# Patient Record
Sex: Female | Born: 1945
Health system: Southern US, Community
[De-identification: ages and names within clinical notes are randomized; demographics above are authoritative.]

## PROBLEM LIST (undated history)

## (undated) DIAGNOSIS — K219 Gastro-esophageal reflux disease without esophagitis: Secondary | ICD-10-CM

## (undated) DIAGNOSIS — I6529 Occlusion and stenosis of unspecified carotid artery: Secondary | ICD-10-CM

## (undated) DIAGNOSIS — R319 Hematuria, unspecified: Secondary | ICD-10-CM

## (undated) DIAGNOSIS — T7840XA Allergy, unspecified, initial encounter: Secondary | ICD-10-CM

## (undated) DIAGNOSIS — M1990 Unspecified osteoarthritis, unspecified site: Secondary | ICD-10-CM

## (undated) DIAGNOSIS — E785 Hyperlipidemia, unspecified: Secondary | ICD-10-CM

## (undated) DIAGNOSIS — N289 Disorder of kidney and ureter, unspecified: Secondary | ICD-10-CM

## (undated) DIAGNOSIS — G4762 Sleep related leg cramps: Secondary | ICD-10-CM

## (undated) DIAGNOSIS — I639 Cerebral infarction, unspecified: Secondary | ICD-10-CM

## (undated) DIAGNOSIS — E119 Type 2 diabetes mellitus without complications: Secondary | ICD-10-CM

## (undated) DIAGNOSIS — I10 Essential (primary) hypertension: Secondary | ICD-10-CM

## (undated) DIAGNOSIS — M1610 Unilateral primary osteoarthritis, unspecified hip: Secondary | ICD-10-CM

## (undated) HISTORY — DX: Sleep related leg cramps: G47.62

## (undated) HISTORY — DX: Essential (primary) hypertension: I10

## (undated) HISTORY — DX: Disorder of kidney and ureter, unspecified: N28.9

## (undated) HISTORY — PX: CAROTID ENDARTERECTOMY: SUR193

## (undated) HISTORY — DX: Hyperlipidemia, unspecified: E78.5

## (undated) HISTORY — DX: Hematuria, unspecified: R31.9

## (undated) HISTORY — DX: Gastro-esophageal reflux disease without esophagitis: K21.9

## (undated) HISTORY — PX: OTHER SURGICAL HISTORY: SHX169

## (undated) HISTORY — DX: Occlusion and stenosis of unspecified carotid artery: I65.29

## (undated) HISTORY — DX: Unilateral primary osteoarthritis, unspecified hip: M16.10

## (undated) HISTORY — DX: Type 2 diabetes mellitus without complications: E11.9

## (undated) HISTORY — DX: Allergy, unspecified, initial encounter: T78.40XA

## (undated) HISTORY — PX: CHOLECYSTECTOMY: SHX55

## (undated) HISTORY — PX: TOTAL ABDOMINAL HYSTERECTOMY: SHX209

## (undated) HISTORY — DX: Unspecified osteoarthritis, unspecified site: M19.90

## (undated) HISTORY — PX: COLONOSCOPY: SHX174

## (undated) HISTORY — DX: Cerebral infarction, unspecified: I63.9

---

## 1997-12-25 ENCOUNTER — Ambulatory Visit (HOSPITAL_COMMUNITY): Admission: RE | Admit: 1997-12-25 | Discharge: 1997-12-25 | Payer: Self-pay | Admitting: Obstetrics and Gynecology

## 1999-01-19 ENCOUNTER — Other Ambulatory Visit: Admission: RE | Admit: 1999-01-19 | Discharge: 1999-01-19 | Payer: Self-pay | Admitting: Obstetrics and Gynecology

## 1999-02-18 ENCOUNTER — Encounter: Payer: Self-pay | Admitting: Obstetrics and Gynecology

## 1999-02-18 ENCOUNTER — Ambulatory Visit (HOSPITAL_COMMUNITY): Admission: RE | Admit: 1999-02-18 | Discharge: 1999-02-18 | Payer: Self-pay | Admitting: Obstetrics and Gynecology

## 2001-04-29 ENCOUNTER — Emergency Department (HOSPITAL_COMMUNITY): Admission: EM | Admit: 2001-04-29 | Discharge: 2001-04-29 | Payer: Self-pay | Admitting: Emergency Medicine

## 2001-10-11 DIAGNOSIS — M1610 Unilateral primary osteoarthritis, unspecified hip: Secondary | ICD-10-CM

## 2001-10-11 HISTORY — DX: Unilateral primary osteoarthritis, unspecified hip: M16.10

## 2002-01-29 ENCOUNTER — Encounter: Payer: Self-pay | Admitting: Family Medicine

## 2002-01-29 ENCOUNTER — Ambulatory Visit (HOSPITAL_COMMUNITY): Admission: RE | Admit: 2002-01-29 | Discharge: 2002-01-29 | Payer: Self-pay | Admitting: Family Medicine

## 2003-03-13 ENCOUNTER — Encounter: Payer: Self-pay | Admitting: Neurology

## 2003-03-13 ENCOUNTER — Inpatient Hospital Stay (HOSPITAL_COMMUNITY): Admission: EM | Admit: 2003-03-13 | Discharge: 2003-03-16 | Payer: Self-pay | Admitting: Neurology

## 2003-03-13 ENCOUNTER — Encounter: Payer: Self-pay | Admitting: Emergency Medicine

## 2003-03-14 ENCOUNTER — Encounter (INDEPENDENT_AMBULATORY_CARE_PROVIDER_SITE_OTHER): Payer: Self-pay | Admitting: Cardiology

## 2003-03-15 ENCOUNTER — Encounter (INDEPENDENT_AMBULATORY_CARE_PROVIDER_SITE_OTHER): Payer: Self-pay | Admitting: Specialist

## 2004-10-01 ENCOUNTER — Ambulatory Visit: Payer: Self-pay | Admitting: Orthopedic Surgery

## 2005-03-12 ENCOUNTER — Ambulatory Visit (HOSPITAL_COMMUNITY): Admission: RE | Admit: 2005-03-12 | Discharge: 2005-03-12 | Payer: Self-pay | Admitting: Family Medicine

## 2005-07-15 ENCOUNTER — Ambulatory Visit: Payer: Self-pay | Admitting: *Deleted

## 2005-07-15 ENCOUNTER — Ambulatory Visit (HOSPITAL_COMMUNITY): Admission: RE | Admit: 2005-07-15 | Discharge: 2005-07-15 | Payer: Self-pay | Admitting: *Deleted

## 2005-07-21 ENCOUNTER — Ambulatory Visit: Payer: Self-pay | Admitting: Cardiology

## 2005-07-21 ENCOUNTER — Inpatient Hospital Stay (HOSPITAL_BASED_OUTPATIENT_CLINIC_OR_DEPARTMENT_OTHER): Admission: RE | Admit: 2005-07-21 | Discharge: 2005-07-21 | Payer: Self-pay | Admitting: Cardiology

## 2005-07-28 ENCOUNTER — Ambulatory Visit: Payer: Self-pay | Admitting: *Deleted

## 2005-07-28 ENCOUNTER — Encounter (HOSPITAL_COMMUNITY): Admission: RE | Admit: 2005-07-28 | Discharge: 2005-08-27 | Payer: Self-pay | Admitting: *Deleted

## 2005-08-02 ENCOUNTER — Ambulatory Visit: Payer: Self-pay | Admitting: *Deleted

## 2006-05-05 ENCOUNTER — Ambulatory Visit (HOSPITAL_COMMUNITY): Admission: RE | Admit: 2006-05-05 | Discharge: 2006-05-05 | Payer: Self-pay | Admitting: Family Medicine

## 2006-05-10 ENCOUNTER — Encounter (HOSPITAL_COMMUNITY): Admission: RE | Admit: 2006-05-10 | Discharge: 2006-06-09 | Payer: Self-pay | Admitting: Family Medicine

## 2006-05-23 ENCOUNTER — Encounter (INDEPENDENT_AMBULATORY_CARE_PROVIDER_SITE_OTHER): Payer: Self-pay | Admitting: *Deleted

## 2006-05-23 ENCOUNTER — Ambulatory Visit (HOSPITAL_COMMUNITY): Admission: RE | Admit: 2006-05-23 | Discharge: 2006-05-23 | Payer: Self-pay | Admitting: General Surgery

## 2006-12-29 ENCOUNTER — Ambulatory Visit (HOSPITAL_COMMUNITY): Admission: RE | Admit: 2006-12-29 | Discharge: 2006-12-29 | Payer: Self-pay | Admitting: Family Medicine

## 2007-03-01 ENCOUNTER — Ambulatory Visit: Payer: Self-pay | Admitting: Vascular Surgery

## 2007-12-05 ENCOUNTER — Ambulatory Visit: Payer: Self-pay | Admitting: Vascular Surgery

## 2008-01-05 ENCOUNTER — Ambulatory Visit (HOSPITAL_COMMUNITY): Admission: RE | Admit: 2008-01-05 | Discharge: 2008-01-05 | Payer: Self-pay | Admitting: Family Medicine

## 2008-11-19 ENCOUNTER — Ambulatory Visit: Payer: Self-pay | Admitting: Vascular Surgery

## 2009-01-10 ENCOUNTER — Ambulatory Visit (HOSPITAL_COMMUNITY): Admission: RE | Admit: 2009-01-10 | Discharge: 2009-01-10 | Payer: Self-pay | Admitting: Family Medicine

## 2009-11-20 ENCOUNTER — Encounter: Payer: Self-pay | Admitting: Orthopedic Surgery

## 2009-11-20 ENCOUNTER — Ambulatory Visit (HOSPITAL_COMMUNITY): Admission: RE | Admit: 2009-11-20 | Discharge: 2009-11-20 | Payer: Self-pay | Admitting: Family Medicine

## 2009-11-27 ENCOUNTER — Encounter (HOSPITAL_COMMUNITY): Admission: RE | Admit: 2009-11-27 | Discharge: 2009-12-27 | Payer: Self-pay | Admitting: Family Medicine

## 2009-11-27 ENCOUNTER — Ambulatory Visit: Payer: Self-pay | Admitting: Vascular Surgery

## 2009-12-23 ENCOUNTER — Encounter: Payer: Self-pay | Admitting: Orthopedic Surgery

## 2009-12-29 ENCOUNTER — Encounter (HOSPITAL_COMMUNITY): Admission: RE | Admit: 2009-12-29 | Discharge: 2010-01-28 | Payer: Self-pay | Admitting: Family Medicine

## 2009-12-29 ENCOUNTER — Ambulatory Visit: Payer: Self-pay | Admitting: Orthopedic Surgery

## 2009-12-29 DIAGNOSIS — M719 Bursopathy, unspecified: Secondary | ICD-10-CM

## 2009-12-29 DIAGNOSIS — M67919 Unspecified disorder of synovium and tendon, unspecified shoulder: Secondary | ICD-10-CM | POA: Insufficient documentation

## 2010-01-26 ENCOUNTER — Encounter: Payer: Self-pay | Admitting: Orthopedic Surgery

## 2010-02-02 ENCOUNTER — Encounter (HOSPITAL_COMMUNITY): Admission: RE | Admit: 2010-02-02 | Discharge: 2010-03-04 | Payer: Self-pay | Admitting: Family Medicine

## 2010-02-04 ENCOUNTER — Emergency Department (HOSPITAL_COMMUNITY): Admission: EM | Admit: 2010-02-04 | Discharge: 2010-02-04 | Payer: Self-pay | Admitting: Emergency Medicine

## 2010-02-13 ENCOUNTER — Encounter: Payer: Self-pay | Admitting: Orthopedic Surgery

## 2010-02-20 ENCOUNTER — Encounter: Payer: Self-pay | Admitting: Orthopedic Surgery

## 2010-05-08 ENCOUNTER — Ambulatory Visit (HOSPITAL_COMMUNITY): Admission: RE | Admit: 2010-05-08 | Discharge: 2010-05-08 | Payer: Self-pay | Admitting: Family Medicine

## 2010-05-12 ENCOUNTER — Ambulatory Visit (HOSPITAL_COMMUNITY): Admission: RE | Admit: 2010-05-12 | Discharge: 2010-05-12 | Payer: Self-pay | Admitting: Family Medicine

## 2010-05-14 ENCOUNTER — Ambulatory Visit: Payer: Self-pay | Admitting: Vascular Surgery

## 2010-11-01 ENCOUNTER — Encounter: Payer: Self-pay | Admitting: Family Medicine

## 2010-11-12 NOTE — Letter (Signed)
Summary: Medical records request Attorney Twana First  Medical records request Attorney Twana First   Imported By: Cammie Sickle 02/27/2010 10:27:26  _____________________________________________________________________  External Attachment:    Type:   Image     Comment:   External Document

## 2010-11-12 NOTE — Miscellaneous (Signed)
Summary: OT clinical evaluation  OT clinical evaluation   Imported By: Jacklynn Ganong 01/06/2010 14:34:07  _____________________________________________________________________  External Attachment:    Type:   Image     Comment:   External Document

## 2010-11-12 NOTE — Miscellaneous (Signed)
Summary: OT progress note  OT progress note   Imported By: Jacklynn Ganong 01/29/2010 10:12:20  _____________________________________________________________________  External Attachment:    Type:   Image     Comment:   External Document
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
## 2010-11-12 NOTE — Miscellaneous (Signed)
Summary: Rehab Report progress note  Rehab Report progress note   Imported By: Jacklynn Ganong 02/25/2010 09:53:07  _____________________________________________________________________  External Attachment:    Type:   Image     Comment:   External Document

## 2010-12-02 ENCOUNTER — Other Ambulatory Visit (INDEPENDENT_AMBULATORY_CARE_PROVIDER_SITE_OTHER): Payer: Medicare Other

## 2010-12-02 DIAGNOSIS — Z48812 Encounter for surgical aftercare following surgery on the circulatory system: Secondary | ICD-10-CM

## 2010-12-02 DIAGNOSIS — I6529 Occlusion and stenosis of unspecified carotid artery: Secondary | ICD-10-CM

## 2010-12-10 NOTE — Procedures (Unsigned)
CAROTID DUPLEX EXAM  INDICATION:  Right carotid endarterectomy.  HISTORY: Diabetes:  Yes. Cardiac:  No. Hypertension:  Yes. Smoking:  No. Previous Surgery:  Right carotid endarterectomy in June 2004. CV History:  Currently asymptomatic. Amaurosis Fugax No, Paresthesias No, Hemiparesis No.                                      RIGHT             LEFT Brachial systolic pressure:         164               166 Brachial Doppler waveforms:         Normal            Normal Vertebral direction of flow:        Antegrade         Antegrade DUPLEX VELOCITIES (cm/sec) CCA peak systolic                   75                72 ECA peak systolic                   87                63 ICA peak systolic                   50                84 ICA end diastolic                   19                30 PLAQUE MORPHOLOGY:                                    Calcific PLAQUE AMOUNT:                      None              Mild/moderate PLAQUE LOCATION:                                      ICA  IMPRESSION: 1. Patent right carotid endarterectomy site with no right internal     carotid artery stenosis. 2. No hemodynamically significant stenosis of the left internal     carotid artery with plaque formations as described above. 3. No significant change noted when compared to the previous     examination on 11/27/2009.  ___________________________________________ Di Kindle. Edilia Bo, M.D.  CH/MEDQ  D:  12/02/2010  T:  12/02/2010  Job:  [id number]

## 2011-02-23 NOTE — Assessment & Plan Note (Signed)
OFFICE VISIT   Kara Kirby, RAU  DOB:  [birth year]                                       12/05/2007  [id number]#:[id number]   I saw the patient in the office today for continued followup of her  carotid disease.  She underwent a right carotid endarterectomy with  Dacron patch angioplasty in June 2004.  She has been quite nervous about  her carotid disease and we follow her on a regular basis.  She comes in  for a 38-month followup visit.   Since I saw her last, she does state that in November she had an episode  where she had some weakness on the left side, but describes also some  pain in her left hip.  Apparently, there is also an issue with her blood  sugar being low at that time, so I am not clear whether or not she has  had a TIA, although it is certainly possible.  She has had no further  symptoms since then.  She has had no history of expressive or receptive  aphasia, or amaurosis fugax.   REVIEW OF SYSTEMS:  She has had no recent chest pain, chest pressure,  palpitations, or arrhythmias.  She has had no bronchitis, asthma, or  wheezing.   PHYSICAL EXAMINATION:  Blood pressure is 186/105, heart rate is 79.  HEENT, there is no cervical lymphadenopathy.  I do not detect any  carotid bruits.  Lungs are clear bilaterally to auscultation.  On  cardiac exam, she has a regular rate and rhythm.  Her abdomen is soft  and nontender.  Neurologic exam is nonfocal.   Her carotid duplex scan shows that her right carotid endarterectomy is  widely patent without evidence of restenosis.  She has no significant  stenosis on the left.  Both vertebral arteries are patent with normally-  directed flow.   I have reassured her that she has no evidence of recurrent carotid  disease.  She is on aspirin and is continuing this.  I had discussed  with her the potential of stretching her followup out to 1 year.  However, she still does not feel comfortable with this, and so  we  compromised, and we will see her back in 6 months for a followup duplex  scan.  She knows to call sooner if she has problems.   Di Kindle. Edilia Bo, M.D.  Electronically Signed   CSD/MEDQ  D:  12/05/2007  T:  12/06/2007  Job:  [id number]

## 2011-02-23 NOTE — Procedures (Signed)
CAROTID DUPLEX EXAM   INDICATION:  Carotid disease.   HISTORY:  Diabetes:  Yes.  Cardiac:  No.  Hypertension:  Yes.  Smoking:  No.  Previous Surgery:  Right carotid endarterectomy in June of 2004 by Dr.  Edilia Bo.  CV History:  Currently asymptomatic.  Amaurosis Fugax No, Paresthesias No, Hemiparesis No                                       RIGHT             LEFT  Brachial systolic pressure:         180               174  Brachial Doppler waveforms:         Normal            Normal  Vertebral direction of flow:        Antegrade         Antegrade  DUPLEX VELOCITIES (cm/sec)  CCA peak systolic                   67                66  ECA peak systolic                   84                54  ICA peak systolic                   63                102  ICA end diastolic                   20                38  PLAQUE MORPHOLOGY:                                    Mixed / calcific  PLAQUE AMOUNT:                      None              Mild  PLAQUE LOCATION:                                      ICA   IMPRESSION:  1. Patent right carotid endarterectomy site with no internal carotid      artery stenosis.  2. 1%-39% stenosis of the left internal carotid artery.  3. No significant change noted when compared to the previous exam on      11/19/2008.   ___________________________________________  Di Kindle. Edilia Bo, M.D.   CH/MEDQ  D:  11/27/2009  T:  11/27/2009  Job:  [id number]

## 2011-02-23 NOTE — Procedures (Signed)
CAROTID DUPLEX EXAM   INDICATION:  Follow-up evaluation of known carotid artery disease.   HISTORY:  Diabetes:  Yes.  Cardiac:  No.  Hypertension:  Yes.  Smoking:  No.  Previous Surgery:  Right carotid endarterectomy in 03/2003 by Dr.  Edilia Bo.  CV History:  Previous duplex on 12/05/07 revealed no right ICA stenosis,  status post endarterectomy and a 20-39% left ICA stenosis.  Amaurosis Fugax No, Paresthesias No, Hemiparesis No.                                       RIGHT             LEFT  Brachial systolic pressure:         190               188  Brachial Doppler waveforms:         Triphasic         Triphasic  Vertebral direction of flow:        Antegrade         Antegrade  DUPLEX VELOCITIES (cm/sec)  CCA peak systolic                   128               79  ECA peak systolic                   39                82  ICA peak systolic                   109               112  ICA end diastolic                   34                40  PLAQUE MORPHOLOGY:                  Soft              Calcified  PLAQUE AMOUNT:                      Mild              Mild  PLAQUE LOCATION:                    Proximal ICA      Proximal ICA   IMPRESSION:  1. No right internal carotid artery stenosis, status post      endarterectomy.  2. 40-59% left internal carotid artery stenosis.       ___________________________________________  Di Kindle. Edilia Bo, M.D.   MC/MEDQ  D:  11/19/2008  T:  11/19/2008  Job:  [id number]

## 2011-02-23 NOTE — Procedures (Signed)
CAROTID DUPLEX EXAM   INDICATION:  Followup of PVD.  In exam May of 2008 indicated a left ICA  20-39% stenosis with the right ICA within normal limits.   HISTORY:  Diabetes:  Yes.  Cardiac:  No.  Hypertension:  Yes.  Smoking:  No.  Previous Surgery:  Right CEA June of 2004 by Dr. Edilia Bo.  CV History:  No.  Amaurosis Fugax No, Paresthesias No, Hemiparesis No                                       RIGHT             LEFT  Brachial systolic pressure:         176               172  Brachial Doppler waveforms:  Vertebral direction of flow:        Antegrade         Antegrade  DUPLEX VELOCITIES (cm/sec)  CCA peak systolic                   77                71  ECA peak systolic                   80                72  ICA peak systolic                   90                87  ICA end diastolic                   34                38  PLAQUE MORPHOLOGY:                  Not applicable    Calcified  PLAQUE AMOUNT:                      Not applicable    Mild  PLAQUE LOCATION:                    Not applicable  Bifurcation/PICA/ECA   IMPRESSION:  1. Right ICA status post carotid endarterectomy without recurrent      stenosis.  2. Left ICA with a 20-39% stenosis which is stable with mild      calcification.  3. Bilateral external carotid arteries and vertebral arteries are      within normal limits.   ___________________________________________  Di Kindle. Edilia Bo, M.D.   AR/MEDQ  D:  12/05/2007  T:  12/06/2007  Job:  [id number]

## 2011-02-26 NOTE — Discharge Summary (Signed)
NAME:  Kara Kirby, Kara Kirby                    ACCOUNT NO.:  [id number]   MEDICAL RECORD NO.:  [id number]                   PATIENT TYPE:  INP   LOCATION:  3314                                 FACILITY:  [facility]   PHYSICIAN:  Di Kindle. Edilia Bo, M.D.        DATE OF BIRTH:  [birth year]   DATE OF ADMISSION:  03/13/2003  DATE OF DISCHARGE:  03/16/2003                                 DISCHARGE SUMMARY   PRIMARY ADMITTING DIAGNOSIS:  Transient ischemic attack.   DISCHARGE DIAGNOSES:  1. Transient ischemic attack.  2. Hypertension.  3. Type 2 non-insulin-dependent diabetes mellitus.  4. Hyperlipidemia.  5. Right carotid artery stenosis, symptomatic.   PROCEDURES PERFORMED:  Right carotid endarterectomy with Dacron patch  angioplasty.   HISTORY:  The patient is a 64 year old black female who awoke on the day of  admission complaining of slurred speech, a right facial droop and left leg  weakness.  She initially was seen in the ER at [facility] where she  underwent a CT scan of the head.  Her symptoms improved while she was there  and she was ultimately transferred to [facility] for further  evaluation.   HOSPITAL COURSE:  She was admitted by neurology.  She underwent an MRI of  the brain as well as an MR angiogram and a 2D echocardiogram.  She was found  to have a right parietal infarct on her MRI.  A vascular surgery  consultation was obtained as well as carotid Doppler studies.  She was found  to have significant right internal carotid artery stenosis on the order of  about 90%.  It was felt that her symptoms most likely were related to her  carotid stenosis and because of the severity of her stenosis as well as her  current symptoms it was recommended that she proceed with a carotid  endarterectomy at this time in order to reduce her risk for future stroke.  She was taken to the operating room on March 15, 2003 by Dr. Edilia Bo where she  underwent a right carotid  endarterectomy with Dacron patch angioplasty.  She  tolerated the procedure well and was transferred to the intermediate care  unit postoperatively in stable condition.  She has done well  postoperatively.  Neurologically she has remained intact.  Her surgical  incision sites are healing well.  She has been tolerating a regular diet and  having normal bowel and bladder function.  She has been ambulating in the  halls without difficulty.  Her pain is well controlled with p.o. pain  medication.  All of her lab values have remained stable.  It is felt that  she has remained stable.  She is ready for discharge home at the end of  postoperative day one, March 16, 2003.   DISCHARGE MEDICATIONS:  1. Enteric coated aspirin 81 mg daily.  2. Zetia 10 mg daily.  3. Adalat 90 mg daily.  4. Actos 30 mg daily.  5.  Glynase 3 mg q.h.s.  6. Prinivil 20 mg daily.  7. Dyazide 37.5/25 mg daily.  8. Tylox one to two q.4h p.r.n. pain.   DISCHARGE INSTRUCTIONS:  She is to refrain from driving, heavy lifting or  strenuous activity.  She may continue daily walking and use of her incentive  spirometer.  She is to shower daily and clean her incision sites with soap  and water.   DISCHARGE FOLLOW UP:  She will see Dr. Edilia Bo in the office in three weeks.  She will also follow up with Dr. Sandria Manly as directed by the neurology service.  She is asked to call our office immediately if she experiences any changes  in her neurologic status, any redness, swelling, or drainage from the  incision sites, or fever greater than 101.     Coral Ceo, P.A.                        Di Kindle. Edilia Bo, M.D.    GC/MEDQ  D:  05/02/2003  T:  05/02/2003  Job:  [id number]   cc:   Marlan Palau, M.D.  [address]  [address]  [place]  [place] [address]  Fax: [phone]    cc:   Marlan Palau, M.D.  [address]  [address]  [place]  [place] [address]  Fax: [phone]

## 2011-02-26 NOTE — Op Note (Signed)
NAME:  Kara Kirby, Kara Kirby          ACCOUNT NO.:  [id number]   MEDICAL RECORD NO.:  [id number]          PATIENT TYPE:  AMB   LOCATION:  DAY                           FACILITY:  [facility]   PHYSICIAN:  Dalia Heading, M.D.  DATE OF BIRTH:  January 31, 1946   DATE OF PROCEDURE:  05/23/2006  DATE OF DISCHARGE:                                 OPERATIVE REPORT   PREOPERATIVE DIAGNOSIS:  Chronic cholecystitis.   POSTOPERATIVE DIAGNOSIS:  Chronic cholecystitis.   PROCEDURE:  Laparoscopic cholecystectomy.   SURGEON:  Dalia Heading, M.D.   ASSISTANT:  Buena Irish, M.D.   ANESTHESIA:  General endotracheal.   INDICATIONS:  The patient is a 65 year old black female who presents with  biliary colic secondary to chronic cholecystitis.  The risks and  benefits  of the procedure including bleeding, infection, hepatobiliary injury and the  possibility of an open procedure were fully explained to the patient, who  gave informed consent.   PROCEDURE NOTE:  The patient was placed in supine position.  After induction  of general endotracheal anesthesia, the abdomen was prepped and draped using  the usual sterile technique with Betadine.  Surgical site confirmation was  performed.   A supraumbilical incision was made down to the fascia.  A Veress needle was  then introduced into the abdominal cavity and confirmation of placement was  done using the saline drop test.  The abdomen was then insufflated to 16  mmHg pressure.  An 11-mm trocar was introduced into the abdominal cavity  under direct visualization without difficulty.  The patient was placed in  reverse Trendelenburg position; and an additional 1-mm trocar was placed in  the epigastric region.  Then 5-mm trocars were placed in the right upper  quadrant and right flank regions.  Liver was inspected and noted to normal  limits.  The gallbladder was retracted superior and laterally.   Dissection was begun around the infundibulum of the  gallbladder.  The cystic  duct was first identified.  Its juncture to the infundibulum fully  identified.  Endoclips were placed proximally and distally on the cystic  duct; and the cystic duct was divided.  This was likewise done on the cystic  artery.  The gallbladder was then freed away from the gallbladder fossa  using Bovie electrocautery.  The gallbladder was delivered through the  epigastric trocar site using an EndoCatch bag.  The gallbladder fossa was  inspected; and no abnormal bleeding or bile leakage was noted.  Surgicel was  placed in the gallbladder fossa.  All fluid and air were then evacuated from  the abdominal cavity prior to removal of the trocars.   All wounds were irrigated normal saline.  All wounds were checked with 0.5  cm Sensorcaine.  The supraumbilical fascia was reapproximated using an #0  Vicryl interrupted suture.  All skin incisions were closed using staples.  Betadine ointment and dry sterile dressings were applied.   All tape and needle counts were correct at the end of the procedure. The  patient was extubated in the operating room and went back to recovery room  awake in stable condition.  COMPLICATIONS:  None.   SPECIMEN:  Gallbladder.   BLOOD LOSS:  Minimal.      Dalia Heading, M.D.  Electronically Signed     MAJ/MEDQ  D:  05/23/2006  T:  05/23/2006  Job:  [id number]   cc:   Lorin Picket A. Gerda Diss, MD  Fax: [phone]

## 2011-02-26 NOTE — Procedures (Signed)
NAME:  Kara Kirby, Kara Kirby          ACCOUNT NO.:  [id number]   MEDICAL RECORD NO.:  [id number]          PATIENT TYPE:  REC   LOCATION:                                FACILITY:  [facility]   PHYSICIAN:  Vida Roller, M.D.   DATE OF BIRTH:  July 25, 1946   DATE OF PROCEDURE:  DATE OF DISCHARGE:                                    STRESS TEST   HISTORY:  Ms. Kara Kirby is a 65 year old female with coronary  artery disease with a recent cardiac catheterization revealing 60-70% mid  LAD, 25% proximal circumflex, 30% first obtuse marginal, 25% third obtuse  marginal, and a totally occluded right coronary artery proximally with  bridging collaterals. She had an EF of 65%. Her symptoms include atypical  chest discomfort and dyspnea on exertion. Her cardiac risk factors include  hypertension, hyperlipidemia, diabetes, and family history.   BASELINE DATA:  Electrocardiogram reveals a sinus rhythm at 70 beats per  minute, nonspecific ST abnormalities. Blood pressure is 152/80   54 mg of adenosine was infused over a 4-minute protocol. Myoview injected at  3 minutes. The patient reported chest discomfort and headache which resolved  in recovery. Electrocardiogram revealed a few PVCs. No ischemic changes were  noted.   Final images and results are pending M.D. review.      Jae Dire, P.A. [facility]      Vida Roller, M.D.  Electronically Signed    AB/MEDQ  D:  07/28/2005  T:  07/28/2005  Job:  [id number]

## 2011-02-26 NOTE — Consult Note (Signed)
NAME:  Kara Kirby, Kara Kirby                    ACCOUNT NO.:  [id number]   MEDICAL RECORD NO.:  [id number]                   PATIENT TYPE:  INP   LOCATION:  3035                                 FACILITY:  [facility]   PHYSICIAN:  Di Kindle. Edilia Bo, M.D.        DATE OF BIRTH:  February 06, 1946   DATE OF CONSULTATION:  03/14/2003  DATE OF DISCHARGE:                                   CONSULTATION   REASON FOR CONSULTATION:  Symptomatic right carotid stenosis.   HISTORY:  This is a pleasant 65 year old woman who was admitted yesterday  when she developed the sudden onset of slurred speech, left leg weakness,  and a right facial droop.  She is not sure exactly how long these symptoms  lasted, but states that these symptoms have resolved completely.  She is  currently on heparin.  Her work-up included a carotid duplex scan which  showed a severe 90% right carotid stenosis and vascular surgery was  consulted.   The patient denies any previous history of stroke, TIAs, expressive or  receptive aphasia, or amaurosis fugax.  Of note, she is right-handed.   PAST MEDICAL HISTORY:  1. Non-insulin-dependent diabetes.  2. Hypertension.  3. Hypercholesterolemia.  4. She denies any history of myocardial infarction or history of congestive     heart failure.  5. She does have a history of obesity.   PAST SURGICAL HISTORY:  Hysterectomy.   MEDICATIONS:  1. Zetia 10 mg p.o. daily.  2. Adalat 90 mg p.o. daily.  3. Actos 30 mg p.o. daily.  4. Glynase 3 mg p.o. q.h.s.  5. Prinivil 20 mg p.o. daily.  6. Diazide 37.5/25 mg one p.o. daily.  7. Aspirin 81 mg p.o. daily.   ALLERGIES:  X-RAY DYE.   SOCIAL HISTORY:  The patient is widowed.  She has a son who lives in [place].  I think she has a sister who lives locally.  She does not use  tobacco.   FAMILY HISTORY:  Her mother died with a stroke.  She has five sisters, one  of whom died with a stroke and a heart attack.  She also had a second  sister  with heart disease.   REVIEW OF SYSTEMS:  She has had no significant claudication, rest pain, or  nonhealing ulcers.  She has had no previous history of DVT or phlebitis that  she is aware of.  She does admit to some dyspnea on exertion.   REVIEW OF SYSTEMS:  Otherwise negative and as dictated in her history and  physical.   PHYSICAL EXAMINATION:  VITAL SIGNS:  Temperature 97.0, heart rate 85, blood  pressure 144/70, saturation 98%.  NECK:  I do not detect any carotid bruits.  LUNGS:  Clear bilaterally to auscultation.  CARDIAC:  Regular rate and rhythm.  ABDOMEN:  Somewhat obese and difficult to assess.  EXTREMITIES:  She has palpable femoral, popliteal, and dorsalis pedis pulses  bilaterally.  I can palpate a  right posterior tibial pulse.  I cannot  palpate a left posterior tibial pulse.  Both feet are warm and well  perfused.  She has no significant lower extremity swelling.  NEUROLOGIC:  I do not detect any focal motor weakness or any sensory  deficit.   LABORATORIES:  I have reviewed her carotid duplex scan which shows a 90%  right carotid stenosis.  The left carotid system does not have any  significant stenosis and both vertebral arteries are patent with normally  directed flow.   The patient had a CT scan at [facility] where she had originally  presented which showed no evidence of acute abnormality.   ASSESSMENT/PLAN:  This patient presents with a symptomatic right carotid  stenosis with complete resolution of her symptoms.  She has had a right  hemispheric transient ischemic attack.  She is currently on heparin.  Given  the severity of the right carotid stenosis, I have recommended right carotid  endarterectomy in order to lower her risk of future stroke.  We have  discussed the indications for the procedure and the potential complications  including, but not limited to, bleeding, stroke (perioperative risk 1-2%),  myocardial infarction, nerve injury,  or other unpredictable medical  problems.  All of her questions were answered and she is agreeable to  proceed.  Her surgery has been scheduled for tomorrow.  We will continue her  on her aspirin perioperatively.                                               Di Kindle. Edilia Bo, M.D.    CSD/MEDQ  D:  03/14/2003  T:  03/14/2003  Job:  [id number]   cc:   Casimiro Needle L. Thad Ranger, M.D.  [address]  [address]  [place]  [place] [address]  Fax: [phone]

## 2011-02-26 NOTE — H&P (Signed)
NAME:  Kara Kirby, Kara Kirby          ACCOUNT NO.:  [id number]   MEDICAL RECORD NO.:  [id number]          PATIENT TYPE:  AMB   LOCATION:  DAY                           FACILITY:  [facility]   PHYSICIAN:  Dalia Heading, M.D.  DATE OF BIRTH:  01-13-1946   DATE OF ADMISSION:  DATE OF DISCHARGE:  LH                                HISTORY & PHYSICAL   CHIEF COMPLAINT:  Chronic cholecystitis.   HISTORY OF PRESENT ILLNESS:  The patient is a 65 year old black female who  is referred for evaluation and treatment of biliary colic secondary to  chronic cholecystitis.  She has been having right upper quadrant abdominal  pain, nausea, and bloating for many months.  She does have fatty food  intolerance.  No fever, chills, or jaundice have been noted.   PAST MEDICAL HISTORY:  1. Non-insulin dependent diabetes mellitus.  2. Hypertension.   PAST SURGICAL HISTORY:  Hysterectomy.   CURRENT MEDICATIONS:  A blood pressure pill, diabetic pills.   ALLERGIES:  SULFA.   REVIEW OF SYSTEMS:  The patient denies drinking or smoking.  She denies any  other cardiopulmonary difficulties or bleeding disorders.   PHYSICAL EXAMINATION:  GENERAL:  The patient is a well-developed, well-  nourished white female in no acute distress.  HEENT: No scleral icterus.  LUNGS:  Clear to auscultation with equal breath sounds bilaterally.  CARDIAC:  Heart examination reveals regular rate and rhythm without S3, S4,  or murmurs.  ABDOMEN:  Soft and nondistended.  She is tender in the right upper quadrant  to palpation.  No hepatosplenomegaly, masses, hernias are identified.   Ultrasound of the gallbladder is negative.  HIDA  scan reveals chronic  cholecystitis with a low gallbladder ejection fraction and reproducible  symptoms.   IMPRESSION:  Chronic cholecystitis.   PLAN:  The patient is scheduled for laparoscopic cholecystectomy on May 23, 2006.  The risks and benefits of the procedure including bleeding,  infection,  hepatobiliary injury, and the possibility of an open procedure,  were fully explained to the patient, who gave informed consent.      Dalia Heading, M.D.  Electronically Signed     MAJ/MEDQ  D:  05/19/2006  T:  05/19/2006  Job:  [id number]   cc:   [facility]  Fax: [phone]   Scott A. Gerda Diss, MD  Fax: [phone]

## 2011-02-26 NOTE — Procedures (Signed)
   NAME:  Kara Kirby, Kara Kirby                    ACCOUNT NO.:  [id number]   MEDICAL RECORD NO.:  [id number]                   PATIENT TYPE:  INP   LOCATION:  3314                                 FACILITY:  [facility]   PHYSICIAN:  Edward L. Juanetta Gosling, M.D.             DATE OF BIRTH:  10-21-1945   DATE OF PROCEDURE:  03/13/2003  DATE OF DISCHARGE:                                EKG INTERPRETATION   RESULTS:  Sinus rhythm with rate in the 90s.  There is evidence of left  atrial enlargement.  Small Q waves were seen inferiorly and this could  indicate a previous inferior infarction, but they are rather small and may  be of no significance.   IMPRESSION:  Normal electrocardiogram.                                               Edward L. Juanetta Gosling, M.D.    ELH/MEDQ  D:  03/25/2003  T:  03/26/2003  Job:  [id number]

## 2011-02-26 NOTE — Op Note (Signed)
NAME:  Kara Kirby, Kara Kirby                      ACCOUNT NO.:  [id number]   MEDICAL RECORD NO.:  [id number]                    PATIENT TYPE:  INP   LOCATION:                                       FACILITY:  [facility]   PHYSICIAN:  Di Kindle. Edilia Bo, M.D.        DATE OF BIRTH:  [birth year]   DATE OF PROCEDURE:  03/15/2003  DATE OF DISCHARGE:  03/16/2003                                 OPERATIVE REPORT   PREOPERATIVE DIAGNOSIS:  Symptomatic right carotid stenosis.   POSTOPERATIVE DIAGNOSIS:  Symptomatic right carotid stenosis.   PROCEDURE:  Right carotid endarterectomy with Dacron patch angioplasty.   SURGEON:  Di Kindle. Edilia Bo, M.D.   ASSISTANT:  Zenaida Niece, R.N.S.A.   ANESTHESIA:  General.   INDICATIONS:  This is a 65 year old woman who had presented with a right  hemispheric transient ischemic attack and was found to have a severe right  internal carotid stenosis.  Right carotid endarterectomy was recommended in  order to lower her risk of future stroke.  The procedure and potential  complications had been discussed with the patient preoperatively.   TECHNIQUE:  The patient was taken to the operating room after an arterial  line was placed by anesthesia.  The right neck and upper chest were prepped  and draped in the usual sterile fashion.  An incision was made along the  anterior border of the sternocleidomastoid and the dissection carried down  to the common carotid artery which was dissected free and controlled with a  Rumel tourniquet.  Of note, the artery was somewhat small, and the artery  was also quite deep.  The bifurcation was high.  I divided the facial vein  between 2-0 silk ties and had to dissect well up above the digastric muscle  to allow exposure of the internal carotid artery above the plaque.  The  internal carotid artery above the plaque was also quite small.  The external  carotid artery was controlled with a blue vessel loop as was the  internal  carotid artery.  The superior thyroid artery was controlled with a 2-0 silk  tie.  The patient was heparinized with 10,000 of IV Heparin.  Clamps were  then placed on the internal, then the external, then the common carotid  artery.  A longitudinal arteriotomy was made in the common carotid artery  and extended through the plaque into the internal carotid artery.  The  vessels were backbled and flushed and irrigated with heparinized saline.  An  8 shunt was then placed into the internal carotid artery, backbled, and then  placed into the common carotid artery and secured with Rumel tourniquet.  Flow was re-established through the shunt, and this flow was confirmed with  the Doppler.  Endarterectomy plane was established proximally, and the  plaque was sharply divided.  Eversion endarterectomy was performed of the  external carotid artery.  Distally, there was a nice taper in the plaque,  and no  tacking sutures were required.  The vessel was irrigated with copious  amounts of Heparin and Dextran, and all loose debris was removed.  A Dacron  patch was then sewn using a continuous 6-0 Prolene suture.  Prior to  completing the patch closure, the shunt was removed, the vessels backbled  and flushed appropriately, and the anastomosis completed.  Flow as re-  established first at the external carotid artery and then to the internal  carotid artery.  There was an excellent Doppler signal at the completion and  good pulse.  Hemostasis was obtained in the wound, and heparin was partially  reversed with Protamine.  The wound was closed with a deep layer of 3-0  Vicryl.  The platysma was closed with running 3-0 Vicryl. The skin was closed with a  4-0 subcuticular stitch.  A sterile dressing was applied.  The patient  tolerated the procedure well and was transferred to the recovery room in  satisfactory condition.                                               Di Kindle. Edilia Bo,  M.D.    CSD/MEDQ  D:  03/15/2003  T:  03/16/2003  Job:  [id number]   cc:   Casimiro Needle L. Thad Ranger, M.D.  [address]  [address]  [place]  [place] [address]  Fax: [phone]

## 2011-02-26 NOTE — Cardiovascular Report (Signed)
NAMEKLAUDIA, Kara Kirby          ACCOUNT NO.:  [id number]   MEDICAL RECORD NO.:  [id number]          PATIENT TYPE:  OIB   LOCATION:  1963                         FACILITY:  [facility]   PHYSICIAN:  Rollene Rotunda, M.D.   DATE OF BIRTH:  [birth year]   DATE OF PROCEDURE:  07/21/2005  DATE OF DISCHARGE:                              CARDIAC CATHETERIZATION   PRIMARY CARE PHYSICIAN:  Scott A. Gerda Diss, M.D.   CARDIOLOGIST:  Vida Roller, M.D. at [facility].   PROCEDURE:  Left and right heart catheterization/coronary arteriography.   INDICATIONS FOR PROCEDURE:  Evaluate patient with dyspnea and chest pain  with multiple cardiovascular risk factors.   DESCRIPTION OF PROCEDURE:  Left heart catheterization is performed via the  right femoral artery.  The right heart catheterization was performed via the  right femoral vein.  Both vessels were cannulated using anterior wall  puncture.  A #4 French arterial sheath was inserted initially via the  modified Seldinger technique.  I switched this out for a 5 [place] to get  better injections.  The exchange was via wire.  A #7 French venous sheath  was inserted via the modified Seldinger technique.  Preformed Judkins,  pigtail, progressive right catheter and Swan-Ganz catheter were utilized.  The patient tolerated the procedure well and left the lab in stable  condition.   RESULTS:   HEMODYNAMICS:  1.  Left main normal.  2.  LAD had proximal calcification.  There was mid 60-70% stenosis with      calcification.  There was distal diffuse luminal irregularities.  3.  Circumflex in the AV groove had a long proximal 25% stenosis.  OM1 was      large with a long 30% stenosis.  OM2 was small with luminal      irregularities.  OM3 was large and branching with a long 25% stenosis.      The posterolateral was small and normal.  4.  Right coronary artery was dominant.  It was occluded proximally with      bridging collaterals.  There was  also some left to right      collateralization.  There was TIMI 2-3 flow in the vessel.  The distal      vessel was somewhat narrow.  There was a long 30% stenosis before the      PDA.  The PDA was small.   LEFT VENTRICULOGRAM:  Left ventriculogram was obtained in the RAO  projection.  The EF was 65% with mild inferobasilar hypokinesis.  Right  heart pressures demonstrated an RA of 1, RV 29/5, PA 23/10 with a mean of  16, and a cardiac output of 7.31 with an index of 3.7.   CONCLUSION:  Severe right coronary artery obstructive disease.  Moderate LAD  disease of questionable hemodynamic significance.  Well-preserved ejection  fraction.  Normal left ventricular pressures.   To further evaluate the hemodynamic significance of the LAD, I will send the  patient for a stress perfusion study.  She will need to have an Adenosine as  she gets dyspneic with walking.  She needs very aggressive risk reduction.  I  have discussed this with the patient and her sister.  I phone Dr. Gerda Diss  to let him know the results and we will discuss it with Dr. Dorethea Clan.           ______________________________  Rollene Rotunda, M.D.     JH/MEDQ  D:  07/21/2005  T:  07/21/2005  Job:  [id number]   cc:   Lorin Picket A. Gerda Diss, MD  Fax: [phone]   Vida Roller, M.D.  Fax: [phone]

## 2011-02-26 NOTE — H&P (Signed)
NAME:  ELLYSON, RARICK                    ACCOUNT NO.:  [id number]   MEDICAL RECORD NO.:  [id number]                   PATIENT TYPE:  INP   LOCATION:  3035                                 FACILITY:  [facility]   PHYSICIAN:  Marlan Palau, M.D.               DATE OF BIRTH:  10-19-1945   DATE OF ADMISSION:  03/13/2003  DATE OF DISCHARGE:                                HISTORY & PHYSICAL   HISTORY OF PRESENT ILLNESS:  Dezaria Methot is a 65 year old right-  handed black female born 11-Aug-1946, with a history of hypertension,  diabetes, hypercholesterolemia.  Patient comes to the [facility]  via [facility] for an evaluation of problems with slurred speech,  right facial droop, left leg weakness.  This patient noted the problems  since she awoke this morning, was noting it was difficult to walk with her  left leg.  Patient denied any visual-field changes, double vision but did  note a headache.  Patient went to the [facility] emergency room, underwent a  CT scan of the head that she was told was unremarkable.  Patient then noted  some improvement in her symptomatology.  Patient was sent to [facility] for an evaluation.  CBG was 150.  Patient has normalized with her  deficit at this point, claims her speech is normal, headache is improving.   PAST MEDICAL HISTORY:  1. Significant for history of diabetes.  2. Hypertension.  3. Hypercholesterolemia.  4. Status post hysterectomy.  5. History of obesity.  6. History of transient episode of right facial droop, slurred speech, left     leg weakness.   MEDICATIONS:  1. Include Zetia 10 mg per day.  2. Adalat 90 mg per day.  3. Actos 30 mg per day.  4. Glynase 6 mg one-half tablet at night.  5. Prinivil 20 mg one per day.  6. Dyazide 37.5/25 mg one daily.  7. Aspirin 81 mg per day.   ALLERGIES:  Patient states an allergy to X-RAY DYE.   SOCIAL HISTORY:  Does not smoke or drink.  The patient lives in  the  [place] area, is widowed, works as a Research scientist (medical), has one son who  lives in [place].   FAMILY MEDICAL HISTORY:  Notable for the fact that mother died with stroke,  father died with a blood clot of some sort.  Patient has five sisters; one  has heart disease, status post angioplasty, one sister died with a stroke  and MI.  Has no brothers.   REVIEW OF SYSTEMS:  Positive for no fevers, chills.  Patient did have a  headache today.  Denies neck stiffness.  Denies chest pains.  Has had some  slight shortness of breath.  Denies nausea, vomiting, abdominal pain,  troubles controlling the bowels or the bladder.  Denies black-out episodes.  Patient, again, denies visual-field changes.   PHYSICAL EXAMINATION:  VITAL  SIGNS:  Blood pressure is 180/107, heart rate  114, respiratory rate 20, temperature afebrile.  GENERAL:  This patient is a moderately obese black female who is alert,  cooperative at the time of examination.  HEENT EXAMINATION:  Head is atraumatic.  Eyes, pupils are equal, round, and  reactive to light.  Discs are flat bilaterally.  NECK:  Supple.  No carotid bruits noted.  RESPIRATORY EXAMINATION:  Clear.  CARDIOVASCULAR EXAMINATION:  Reveals a regular rate and rhythm.  No obvious  murmurs or rubs noted.  ABDOMEN:  Reveals positive bowel sounds.  No organomegaly or tenderness is  noted.  EXTREMITIES:  Without significant edema.  NEUROLOGIC EXAMINATION:  Cranial nerves, as above.  Facial symmetry is  present.  Patient has good sensation of the face with pinprick and soft  touch bilaterally.  Patient has good strength ________ head turning,  shoulder shrug bilaterally.  Speech is well enunciated, not aphasic.  Motor  test reveals 5/5 strength in all four.  Good, symmetric motor tone is noted.  Sensory testing is intact to pinprick, soft touch, vibratory sensation  throughout.  Patient has good finger-to-nose and toe-to-finger bilaterally.  Patient is not ambulated.   Deep tendon reflexes are depressed but symmetric,  tone equal bilaterally.  No drift is seen with the upper extremities.   LABORATORY DATA:  Laboratory values notable for a white count of 6.1,  hemoglobin 12.4, hematocrit of 35.4, MCV of 88.2, platelets of 272, INR of  0.8, total protein 7.0, albumin of 3.9, AST of 25, ALT of 22, alkaline  phosphatase of 52, total bili 0.6, direct bili 0.1, indirect bili 0.5,  sodium 137, potassium 2.9, chloride of 103, CO2 of 26, glucose of 150, BUN  of 23, creatinine 1.1, calcium 9.4.   EKG was unremarkable.  CT of the head was reportedly unremarkable at [facility].   IMPRESSION:  1. Probable transient ischemic attack event with dysarthria, right facial     droop, left leg dysfunction.  2. Hypertension.  3. Diabetes.  4. Hypercholesterolemia.   DISPOSITION:  This patient has multiple risk factors for stroke.  The  patient has had a transient event at this point.  We will admit the patient  for further evaluation.   PLAN:  1. Admission to [facility].  2. MRI of the brain.  3. MR angiogram, intracranial and extracranial vessels.  4. A 2-D echocardiogram.  5. IV heparin therapy.  6. We will follow clinical course while in house.                                               Marlan Palau, M.D.    CKW/MEDQ  D:  03/13/2003  T:  03/13/2003  Job:  [id number]   cc:   [facility]  [address]  [address]

## 2011-07-22 DIAGNOSIS — H34832 Tributary (branch) retinal vein occlusion, left eye, with macular edema: Secondary | ICD-10-CM | POA: Insufficient documentation

## 2011-07-22 DIAGNOSIS — H3581 Retinal edema: Secondary | ICD-10-CM | POA: Insufficient documentation

## 2011-07-26 ENCOUNTER — Telehealth: Payer: Self-pay

## 2011-07-26 NOTE — Telephone Encounter (Signed)
Pt to call back with list of meds and insurance information.

## 2011-07-28 NOTE — Telephone Encounter (Signed)
Per Gerrit Halls, NP, pt should come in for an office visit first. She was unsure what dye she is allergic to. She was supposed to have had a repeat colonoscopy in 5 years and it was last done by Dr. Lovell Sheehan 09/09/2000. LMOM for pt to call and schedule OV appt.

## 2011-07-29 NOTE — Telephone Encounter (Signed)
Pt returned call and is scheduled for an OV on 08/04/2011 @ 1:30 AM with Gerrit Halls, NP.

## 2011-07-29 NOTE — Telephone Encounter (Signed)
LMOM pt needs OV first.

## 2011-08-04 ENCOUNTER — Ambulatory Visit (INDEPENDENT_AMBULATORY_CARE_PROVIDER_SITE_OTHER): Payer: Medicare Other | Admitting: Gastroenterology

## 2011-08-04 ENCOUNTER — Encounter: Payer: Self-pay | Admitting: Gastroenterology

## 2011-08-04 VITALS — BP 140/100 | HR 102 | Temp 97.9°F | Ht 64.0 in | Wt 194.4 lb

## 2011-08-04 DIAGNOSIS — Z1211 Encounter for screening for malignant neoplasm of colon: Secondary | ICD-10-CM

## 2011-08-04 NOTE — Progress Notes (Signed)
  Patient was referred by Dr. Gerda Diss for an updated colonoscopy. Last performed by Dr. Lovell Sheehan. The patient was rude to the nurse during the actual office visit while she was proceeding with standard protocol for asking questions such as pharmacy location, pertinent medical history, medications, etc. She refused to answer several of them. When I entered the room, I was greeted with "Who are you?". I had already introduced myself and spent nearly 10 minutes in the room with the patient discussing my role as an NP and in this office, as well as WHY we had her come in today for a visit prior to a colonoscopy. She was upset that she was not seeing Dr. Darrick Penna, and I did offer her the opportunity to reschedule with her; however, I did inform her it may be a few weeks due to scheduling. She questioned me on what I would do today as well as if I could even explain the procedure she would be undergoing. She also proceeded to ask about payment for this office visit with me vs. Dr. Darrick Penna.  She then stated she did not want to proceed with the office visit today. I told her this was perfectly fine, and she would not be charged for today's visit. I made sure the front desk was aware of this as well. As of note, the triage nurse clearly informed the patient that she would be seeing myself today, but the patient denies this.  The patient left stating she would contact us if she wanted to proceed.

## 2011-09-22 ENCOUNTER — Ambulatory Visit (INDEPENDENT_AMBULATORY_CARE_PROVIDER_SITE_OTHER): Payer: Medicare Other | Admitting: Gastroenterology

## 2011-09-22 ENCOUNTER — Encounter: Payer: Self-pay | Admitting: Gastroenterology

## 2011-09-22 VITALS — BP 153/86 | HR 107 | Temp 98.1°F | Ht 64.0 in | Wt 193.4 lb

## 2011-09-22 DIAGNOSIS — Z1211 Encounter for screening for malignant neoplasm of colon: Secondary | ICD-10-CM | POA: Insufficient documentation

## 2011-09-22 NOTE — Progress Notes (Signed)
Subjective:    Patient ID: Kara Kirby, female    DOB: 1945/11/05, 65 y.o.   MRN: [id number]  PCP: [facility] Sil[facility]  HPI NEEDS A COLONOSCOPY-HAD ONE BEFORE WITH DR. Lovell Sheehan. NO PROBLEMS WITH SEDATION OR POLYPS. No problems with the prep. Prefer a MON or TUE. No rectal bleeding, weight loss, or diarrhea. RARE abdominal pain when her bowels are going to move or w/ OJ.  Past Medical History  Diagnosis Date  . DM (diabetes mellitus)   . HTN (hypertension)   . Sleep related leg cramps   . Hyperlipidemia   . GERD (gastroesophageal reflux disease)     intermittent Sx  . CVA (cerebral vascular accident)    Past Surgical History  Procedure Date  . S/p hysterectomy   . Colonoscopy   . Total abdominal hysterectomy     FIBROID, HYPERMENORRHEA  . Carotid endarterectomy RIGHT   Allergies  Allergen Reactions  . Other Hives    IV DYE     Current Outpatient Prescriptions  Medication Sig Dispense Refill  . aspirin 81 MG tablet Take 81 mg by mouth daily.        Marland Kitchen atorvastatin (LIPITOR) 80 MG tablet Take 40 mg by mouth daily.       . Cyclobenzaprine HCl (FLEXERIL PO) Take 15 mg by mouth daily.        Marland Kitchen doxazosin (CARDURA) 4 MG tablet Take 4 mg by mouth at bedtime.       Marland Kitchen glyBURIDE micronized (GLYNASE) 6 MG tablet Take 6 mg by mouth 2 (two) times daily with a meal.       . hydrochlorothiazide (HYDRODIURIL) 25 MG tablet Take 25 mg by mouth daily.       Marland Kitchen KLOR-CON M10 10 MEQ tablet Take 10 mEq by mouth daily.       Marland Kitchen lisinopril (PRINIVIL,ZESTRIL) 20 MG tablet Take 20 mg by mouth 2 (two) times daily.       . metFORMIN (GLUCOPHAGE) 500 MG tablet Take 500 mg by mouth 2 (two) times daily with a meal.       . NIFEDIAC CC 60 MG 24 hr tablet Take 60 mg by mouth daily.       . ranitidine (ZANTAC) 300 MG tablet Take 300 mg by mouth at bedtime.        Family History  Problem Relation Age of Onset  . Colon cancer Neg Hx   . Colon polyps Neg Hx    Review of Systems  All other systems reviewed  and are negative.       Objective:   Physical Exam  Vitals reviewed. Constitutional: She is oriented to person, place, and time. She appears well-developed and well-nourished.  HENT:  Head: Normocephalic and atraumatic.  Mouth/Throat: Oropharynx is clear and moist.  Eyes: Pupils are equal, round, and reactive to light. No scleral icterus.  Neck: Normal range of motion. Neck supple.  Cardiovascular: Normal rate, regular rhythm and normal heart sounds.   Pulmonary/Chest: Effort normal and breath sounds normal. No respiratory distress.  Abdominal: Soft. Bowel sounds are normal. She exhibits no distension.  Musculoskeletal: Normal range of motion. She exhibits no edema.  Lymphadenopathy:    She has no cervical adenopathy.  Neurological: She is alert and oriented to person, place, and time. She exhibits normal muscle tone.       NO FOCAL DEFICITS   Psychiatric:       FLAT AFFECT          Assessment & Plan:

## 2011-09-23 ENCOUNTER — Other Ambulatory Visit: Payer: Self-pay | Admitting: Gastroenterology

## 2011-09-23 DIAGNOSIS — Z1211 Encounter for screening for malignant neoplasm of colon: Secondary | ICD-10-CM

## 2011-09-23 NOTE — Progress Notes (Signed)
Cc to PCP 

## 2011-09-23 NOTE — Assessment & Plan Note (Addendum)
AVERAGE RISK SCREENING HOLD GLYBURIDE THE MORNING OF HER TEST. CONTINUE METFORMIN.  TCS DEC 2012 MOVIPREP SPLIT DOSING

## 2011-10-06 NOTE — Progress Notes (Signed)
See documentation.

## 2011-10-18 MED ORDER — SODIUM CHLORIDE 0.45 % IV SOLN
Freq: Once | INTRAVENOUS | Status: AC
  Administered 2011-10-19: 10:00:00 via INTRAVENOUS

## 2011-10-19 ENCOUNTER — Ambulatory Visit (HOSPITAL_COMMUNITY)
Admission: RE | Admit: 2011-10-19 | Discharge: 2011-10-19 | Disposition: A | Discharge status: Home / Self Care | Payer: Medicare Other | Source: Ambulatory Visit | Attending: Gastroenterology | Admitting: Gastroenterology

## 2011-10-19 ENCOUNTER — Encounter (HOSPITAL_COMMUNITY): Payer: Self-pay

## 2011-10-19 ENCOUNTER — Encounter (HOSPITAL_COMMUNITY)
Admission: RE | Disposition: A | Discharge status: Home / Self Care | Payer: Self-pay | Source: Ambulatory Visit | Attending: Gastroenterology

## 2011-10-19 ENCOUNTER — Other Ambulatory Visit: Payer: Self-pay | Admitting: Gastroenterology

## 2011-10-19 DIAGNOSIS — K620 Anal polyp: Secondary | ICD-10-CM | POA: Diagnosis not present

## 2011-10-19 DIAGNOSIS — D128 Benign neoplasm of rectum: Secondary | ICD-10-CM | POA: Diagnosis not present

## 2011-10-19 DIAGNOSIS — Z1211 Encounter for screening for malignant neoplasm of colon: Secondary | ICD-10-CM | POA: Insufficient documentation

## 2011-10-19 DIAGNOSIS — Z01812 Encounter for preprocedural laboratory examination: Secondary | ICD-10-CM | POA: Diagnosis not present

## 2011-10-19 DIAGNOSIS — I10 Essential (primary) hypertension: Secondary | ICD-10-CM | POA: Diagnosis not present

## 2011-10-19 DIAGNOSIS — D126 Benign neoplasm of colon, unspecified: Secondary | ICD-10-CM | POA: Diagnosis not present

## 2011-10-19 DIAGNOSIS — K648 Other hemorrhoids: Secondary | ICD-10-CM | POA: Insufficient documentation

## 2011-10-19 DIAGNOSIS — E119 Type 2 diabetes mellitus without complications: Secondary | ICD-10-CM | POA: Diagnosis not present

## 2011-10-19 DIAGNOSIS — Z79899 Other long term (current) drug therapy: Secondary | ICD-10-CM | POA: Insufficient documentation

## 2011-10-19 DIAGNOSIS — K5730 Diverticulosis of large intestine without perforation or abscess without bleeding: Secondary | ICD-10-CM | POA: Diagnosis not present

## 2011-10-19 DIAGNOSIS — K621 Rectal polyp: Secondary | ICD-10-CM

## 2011-10-19 HISTORY — PX: COLONOSCOPY: SHX5424

## 2011-10-19 LAB — GLUCOSE, CAPILLARY: Glucose-Capillary: 194 mg/dL — ABNORMAL HIGH (ref 70–99)

## 2011-10-19 SURGERY — COLONOSCOPY
Anesthesia: Moderate Sedation

## 2011-10-19 MED ORDER — MEPERIDINE HCL 100 MG/ML IJ SOLN
INTRAMUSCULAR | Status: AC
  Filled 2011-10-19: qty 1

## 2011-10-19 MED ORDER — MIDAZOLAM HCL 5 MG/5ML IJ SOLN
INTRAMUSCULAR | Status: DC | PRN
  Administered 2011-10-19: 2 mg via INTRAVENOUS
  Administered 2011-10-19: 1 mg via INTRAVENOUS
  Administered 2011-10-19: 2 mg via INTRAVENOUS

## 2011-10-19 MED ORDER — MEPERIDINE HCL 100 MG/ML IJ SOLN
INTRAMUSCULAR | Status: DC | PRN
  Administered 2011-10-19: 25 mg via INTRAVENOUS
  Administered 2011-10-19: 50 mg via INTRAVENOUS

## 2011-10-19 MED ORDER — MIDAZOLAM HCL 5 MG/5ML IJ SOLN
INTRAMUSCULAR | Status: AC
  Filled 2011-10-19: qty 5

## 2011-10-19 MED ORDER — STERILE WATER FOR IRRIGATION IR SOLN
Status: DC | PRN
  Administered 2011-10-19: 11:00:00

## 2011-10-19 NOTE — Op Note (Signed)
[facility] [address] [place], [place]  [address]  COLONOSCOPY PROCEDURE REPORT  PATIENT:  Kara, Kirby  MR#:  [id number] BIRTHDATE:  Nov 29, 1945, 65 yrs. old  GENDER:  female  ENDOSCOPIST:  Jonette Eva, MD REF. BY:  Lilyan Punt, M.D. ASSISTANT:  PROCEDURE DATE:  10/19/2011 PROCEDURE:  Colonoscopy with biopsy  INDICATIONS:  SCREENING  MEDICATIONS:   Demerol 75 mg IV, Versed 5 mg IV  DESCRIPTION OF PROCEDURE:    Physical exam was performed. Informed consent was obtained from the patient after explaining the benefits, risks, and alternatives to procedure.  The patient was connected to monitor and placed in left lateral position. Continuous oxygen was provided by nasal cannula and IV medicine administered through an indwelling cannula.  After administration of sedation and rectal exam, the patient's rectum was intubated and the EC-[id number] ([id number]) colonoscope was advanced under direct visualization to the cecum.  The scope was removed slowly by carefully examining the color, texture, anatomy, and integrity mucosa on the way out.  The patient was recovered in endoscopy and discharged home in satisfactory condition. <<PROCEDUREIMAGES>>  FINDINGS:  THREE (1-3 MM) sessile polyps were found in the rectum REMOVED VIA COLD FORCEPS.  RARE Diverticula were found in the sigmoid colon. SMALL Internal Hemorrhoids were found.  PREP QUALITY: GOOD CECAL W/D TIME:    15 minutes  COMPLICATIONS:    None  ENDOSCOPIC IMPRESSION: 1) Sessile polyp, multiple in the rectum 2) Internal hemorrhoids 3) MILD DiverticulOSIS in the sigmoid colon  RECOMMENDATIONS: TCS IN 10 YEARS HIGH FIBER DIET  REPEAT EXAM:  No  ______________________________ Jonette Eva, MD  CC:  Lilyan Punt, M.D.  n. eSIGNED:   Denina Rieger at 10/19/2011 11:24 AM  Salem Caster, [phone]

## 2011-10-19 NOTE — H&P (View-Only) (Signed)
Subjective:    Patient ID: Kara Kirby, female    DOB: 03/02/1946, 65 y.o.   MRN: [id number]  PCP: [place] [facility]  HPI NEEDS A COLONOSCOPY-HAD ONE BEFORE WITH DR. JENKINS. NO PROBLEMS WITH SEDATION OR POLYPS. No problems with the prep. Prefer a MON or TUE. No rectal bleeding, weight loss, or diarrhea. RARE abdominal pain when her bowels are going to move or w/ OJ.  Past Medical History  Diagnosis Date  . DM (diabetes mellitus)   . HTN (hypertension)   . Sleep related leg cramps   . Hyperlipidemia   . GERD (gastroesophageal reflux disease)     intermittent Sx  . CVA (cerebral vascular accident)    Past Surgical History  Procedure Date  . S/p hysterectomy   . Colonoscopy   . Total abdominal hysterectomy     FIBROID, HYPERMENORRHEA  . Carotid endarterectomy RIGHT   Allergies  Allergen Reactions  . Other Hives    IV DYE     Current Outpatient Prescriptions  Medication Sig Dispense Refill  . aspirin 81 MG tablet Take 81 mg by mouth daily.        . atorvastatin (LIPITOR) 80 MG tablet Take 40 mg by mouth daily.       . Cyclobenzaprine HCl (FLEXERIL PO) Take 15 mg by mouth daily.        . doxazosin (CARDURA) 4 MG tablet Take 4 mg by mouth at bedtime.       . glyBURIDE micronized (GLYNASE) 6 MG tablet Take 6 mg by mouth 2 (two) times daily with a meal.       . hydrochlorothiazide (HYDRODIURIL) 25 MG tablet Take 25 mg by mouth daily.       . KLOR-CON M10 10 MEQ tablet Take 10 mEq by mouth daily.       . lisinopril (PRINIVIL,ZESTRIL) 20 MG tablet Take 20 mg by mouth 2 (two) times daily.       . metFORMIN (GLUCOPHAGE) 500 MG tablet Take 500 mg by mouth 2 (two) times daily with a meal.       . NIFEDIAC CC 60 MG 24 hr tablet Take 60 mg by mouth daily.       . ranitidine (ZANTAC) 300 MG tablet Take 300 mg by mouth at bedtime.        Family History  Problem Relation Age of Onset  . Colon cancer Neg Hx   . Colon polyps Neg Hx    Review of Systems  All other systems reviewed  and are negative.       Objective:   Physical Exam  Vitals reviewed. Constitutional: She is oriented to person, place, and time. She appears well-developed and well-nourished.  HENT:  Head: Normocephalic and atraumatic.  Mouth/Throat: Oropharynx is clear and moist.  Eyes: Pupils are equal, round, and reactive to light. No scleral icterus.  Neck: Normal range of motion. Neck supple.  Cardiovascular: Normal rate, regular rhythm and normal heart sounds.   Pulmonary/Chest: Effort normal and breath sounds normal. No respiratory distress.  Abdominal: Soft. Bowel sounds are normal. She exhibits no distension.  Musculoskeletal: Normal range of motion. She exhibits no edema.  Lymphadenopathy:    She has no cervical adenopathy.  Neurological: She is alert and oriented to person, place, and time. She exhibits normal muscle tone.       NO FOCAL DEFICITS   Psychiatric:       FLAT AFFECT          Assessment & Plan:   

## 2011-10-19 NOTE — Interval H&P Note (Signed)
History and Physical Interval Note:  10/19/2011 10:21 AM  Kara Kirby  has presented today for surgery, with the diagnosis of screening  The various methods of treatment have been discussed with the patient and family. After consideration of risks, benefits and other options for treatment, the patient has consented to  Procedure(s): COLONOSCOPY as a surgical intervention .  The patients' history has been reviewed, patient examined, no change in status, stable for surgery.  I have reviewed the patients' chart and labs.  Questions were answered to the patient's satisfaction.     [employer]

## 2011-10-20 ENCOUNTER — Telehealth: Payer: Self-pay | Admitting: Gastroenterology

## 2011-10-20 NOTE — Telephone Encounter (Signed)
Please call pt. She had HYPERPLASTIC POLYPS removed from her colon. TCS in 10 years. FOLLOW A High fiber diet.

## 2011-10-20 NOTE — Telephone Encounter (Signed)
LMOM all of the results.

## 2011-10-20 NOTE — Telephone Encounter (Signed)
Routing to Susan to nic. 

## 2011-10-21 NOTE — Telephone Encounter (Signed)
Reminder in epic to have tcs in 10 years 

## 2011-10-25 ENCOUNTER — Encounter (HOSPITAL_COMMUNITY): Payer: Self-pay | Admitting: Gastroenterology

## 2011-10-25 ENCOUNTER — Telehealth: Payer: Self-pay | Admitting: Gastroenterology

## 2011-10-25 NOTE — Telephone Encounter (Signed)
Pt called asking how many hemorrhoids were found at time of TCS and what was going to be done about them- please call

## 2011-10-25 NOTE — Telephone Encounter (Signed)
Routing to Dr. Fields.  

## 2011-10-27 ENCOUNTER — Telehealth: Payer: Self-pay | Admitting: Gastroenterology

## 2011-10-27 NOTE — Telephone Encounter (Signed)
LMOM to call.

## 2011-10-27 NOTE — Telephone Encounter (Signed)
PLEASE CALL PT. SHE HAS SMALL HEMORRHOIDS. WE DON'T COUNT THEM. IF THEY DON'T BOTHER HER SHE DOESN'T HAVE TO DO ANYTHING. IF SHE HAS RECTAL PRESSURE/PAIN OR BLEEDING, SHE MAY USE PREP H. She was given info on hemorrhoid management with her discharge instruction JAN 8. We can send her another copy if she would like.  Hemorrhoids Hemorrhoids are dilated (enlarged) veins around the rectum. Sometimes clots will form in the veins. This makes them swollen and painful. These are called thrombosed hemorrhoids. Causes of hemorrhoids include:  Constipation.   Straining to have a bowel movement.   HEAVY LIFTING HOME CARE INSTRUCTIONS  Eat a well balanced diet and drink 6 to 8 glasses of water every day to avoid constipation. You may also use a bulk laxative.   Avoid straining to have bowel movements.   Keep anal area dry and clean.   Do not use a donut shaped pillow or sit on the toilet for long periods. This increases blood pooling and pain.   Move your bowels when your body has the urge; this will require less straining and will decrease pain and pressure.

## 2011-10-28 DIAGNOSIS — H348392 Tributary (branch) retinal vein occlusion, unspecified eye, stable: Secondary | ICD-10-CM | POA: Diagnosis not present

## 2011-10-28 DIAGNOSIS — E119 Type 2 diabetes mellitus without complications: Secondary | ICD-10-CM | POA: Insufficient documentation

## 2011-10-28 DIAGNOSIS — H3581 Retinal edema: Secondary | ICD-10-CM | POA: Diagnosis not present

## 2011-11-01 NOTE — Telephone Encounter (Signed)
Informed pt. She still has her material on hemorrhoids.

## 2011-11-19 DIAGNOSIS — Z23 Encounter for immunization: Secondary | ICD-10-CM | POA: Diagnosis not present

## 2011-11-19 DIAGNOSIS — E119 Type 2 diabetes mellitus without complications: Secondary | ICD-10-CM | POA: Diagnosis not present

## 2011-11-19 DIAGNOSIS — J019 Acute sinusitis, unspecified: Secondary | ICD-10-CM | POA: Diagnosis not present

## 2011-11-19 DIAGNOSIS — I10 Essential (primary) hypertension: Secondary | ICD-10-CM | POA: Diagnosis not present

## 2011-12-02 DIAGNOSIS — J019 Acute sinusitis, unspecified: Secondary | ICD-10-CM | POA: Diagnosis not present

## 2011-12-02 DIAGNOSIS — I10 Essential (primary) hypertension: Secondary | ICD-10-CM | POA: Diagnosis not present

## 2011-12-28 ENCOUNTER — Ambulatory Visit (INDEPENDENT_AMBULATORY_CARE_PROVIDER_SITE_OTHER): Payer: Medicare Other | Admitting: *Deleted

## 2011-12-28 DIAGNOSIS — I6529 Occlusion and stenosis of unspecified carotid artery: Secondary | ICD-10-CM | POA: Diagnosis not present

## 2011-12-28 DIAGNOSIS — Z48812 Encounter for surgical aftercare following surgery on the circulatory system: Secondary | ICD-10-CM | POA: Diagnosis not present

## 2012-01-05 ENCOUNTER — Other Ambulatory Visit: Payer: Self-pay | Admitting: *Deleted

## 2012-01-05 DIAGNOSIS — Z48812 Encounter for surgical aftercare following surgery on the circulatory system: Secondary | ICD-10-CM

## 2012-01-05 DIAGNOSIS — I6529 Occlusion and stenosis of unspecified carotid artery: Secondary | ICD-10-CM

## 2012-01-06 ENCOUNTER — Encounter: Payer: Self-pay | Admitting: Vascular Surgery

## 2012-01-06 NOTE — Procedures (Unsigned)
CAROTID DUPLEX EXAM  INDICATION:  Follow up right carotid endarterectomy.  HISTORY: Diabetes:  Yes Cardiac:  No Hypertension:  Yes Smoking:  No Previous Surgery:  Right CEA 2004 CV History: Amaurosis Fugax No, Paresthesias No, Hemiparesis No                                      RIGHT             LEFT Brachial systolic pressure:         178               178 Brachial Doppler waveforms:         WNL               WNL Vertebral direction of flow:        Antegrade         Antegrade DUPLEX VELOCITIES (cm/sec) CCA peak systolic                   71                72 ECA peak systolic                   88                69 ICA peak systolic                   46                69 ICA end diastolic                   11                21 PLAQUE MORPHOLOGY:                  Heterogenous Heterogenous/calcific PLAQUE AMOUNT:                      Minimal           Mild PLAQUE LOCATION:                    CEA               ICA  IMPRESSION: 1. Widely patent right CEA without evidence of restenosis. 2. 1% to 39% left ICA stenosis. 3. Bilateral vertebral arteries are within normal limits.  ___________________________________________ Di Kindle. Edilia Bo, M.D.  LT/MEDQ  D:  12/29/2011  T:  12/29/2011  Job:  [id number]

## 2012-01-17 DIAGNOSIS — Z79899 Other long term (current) drug therapy: Secondary | ICD-10-CM | POA: Diagnosis not present

## 2012-01-17 DIAGNOSIS — E119 Type 2 diabetes mellitus without complications: Secondary | ICD-10-CM | POA: Diagnosis not present

## 2012-01-17 DIAGNOSIS — E782 Mixed hyperlipidemia: Secondary | ICD-10-CM | POA: Diagnosis not present

## 2012-01-24 DIAGNOSIS — R358 Other polyuria: Secondary | ICD-10-CM | POA: Diagnosis not present

## 2012-01-24 DIAGNOSIS — E119 Type 2 diabetes mellitus without complications: Secondary | ICD-10-CM | POA: Diagnosis not present

## 2012-01-24 DIAGNOSIS — E785 Hyperlipidemia, unspecified: Secondary | ICD-10-CM | POA: Diagnosis not present

## 2012-01-24 DIAGNOSIS — I10 Essential (primary) hypertension: Secondary | ICD-10-CM | POA: Diagnosis not present

## 2012-02-09 ENCOUNTER — Other Ambulatory Visit: Payer: Medicare Other

## 2012-02-09 ENCOUNTER — Ambulatory Visit: Payer: Medicare Other | Admitting: Vascular Surgery

## 2012-03-30 DIAGNOSIS — H2589 Other age-related cataract: Secondary | ICD-10-CM | POA: Diagnosis not present

## 2012-03-30 DIAGNOSIS — H348392 Tributary (branch) retinal vein occlusion, unspecified eye, stable: Secondary | ICD-10-CM | POA: Diagnosis not present

## 2012-03-30 DIAGNOSIS — H25819 Combined forms of age-related cataract, unspecified eye: Secondary | ICD-10-CM | POA: Insufficient documentation

## 2012-03-30 DIAGNOSIS — E119 Type 2 diabetes mellitus without complications: Secondary | ICD-10-CM | POA: Diagnosis not present

## 2012-03-30 DIAGNOSIS — H3581 Retinal edema: Secondary | ICD-10-CM | POA: Diagnosis not present

## 2012-04-24 DIAGNOSIS — I10 Essential (primary) hypertension: Secondary | ICD-10-CM | POA: Diagnosis not present

## 2012-04-24 DIAGNOSIS — E119 Type 2 diabetes mellitus without complications: Secondary | ICD-10-CM | POA: Diagnosis not present

## 2012-04-24 DIAGNOSIS — E785 Hyperlipidemia, unspecified: Secondary | ICD-10-CM | POA: Diagnosis not present

## 2012-07-05 DIAGNOSIS — I10 Essential (primary) hypertension: Secondary | ICD-10-CM | POA: Diagnosis not present

## 2012-07-05 DIAGNOSIS — J019 Acute sinusitis, unspecified: Secondary | ICD-10-CM | POA: Diagnosis not present

## 2012-07-05 DIAGNOSIS — Z23 Encounter for immunization: Secondary | ICD-10-CM | POA: Diagnosis not present

## 2012-08-03 DIAGNOSIS — H40009 Preglaucoma, unspecified, unspecified eye: Secondary | ICD-10-CM | POA: Diagnosis not present

## 2012-08-03 DIAGNOSIS — H348392 Tributary (branch) retinal vein occlusion, unspecified eye, stable: Secondary | ICD-10-CM | POA: Diagnosis not present

## 2012-08-03 DIAGNOSIS — H2589 Other age-related cataract: Secondary | ICD-10-CM | POA: Diagnosis not present

## 2012-08-03 DIAGNOSIS — H3581 Retinal edema: Secondary | ICD-10-CM | POA: Diagnosis not present

## 2012-08-29 DIAGNOSIS — H2510 Age-related nuclear cataract, unspecified eye: Secondary | ICD-10-CM | POA: Diagnosis not present

## 2012-08-29 DIAGNOSIS — E119 Type 2 diabetes mellitus without complications: Secondary | ICD-10-CM | POA: Diagnosis not present

## 2012-09-21 DIAGNOSIS — E119 Type 2 diabetes mellitus without complications: Secondary | ICD-10-CM | POA: Diagnosis not present

## 2012-09-21 DIAGNOSIS — E782 Mixed hyperlipidemia: Secondary | ICD-10-CM | POA: Diagnosis not present

## 2012-09-21 DIAGNOSIS — I10 Essential (primary) hypertension: Secondary | ICD-10-CM | POA: Diagnosis not present

## 2012-09-21 DIAGNOSIS — E785 Hyperlipidemia, unspecified: Secondary | ICD-10-CM | POA: Diagnosis not present

## 2012-09-21 DIAGNOSIS — Z79899 Other long term (current) drug therapy: Secondary | ICD-10-CM | POA: Diagnosis not present

## 2012-09-21 DIAGNOSIS — M7530 Calcific tendinitis of unspecified shoulder: Secondary | ICD-10-CM | POA: Diagnosis not present

## 2012-09-26 ENCOUNTER — Other Ambulatory Visit: Payer: Self-pay | Admitting: Family Medicine

## 2012-09-26 DIAGNOSIS — Z139 Encounter for screening, unspecified: Secondary | ICD-10-CM

## 2012-09-29 ENCOUNTER — Ambulatory Visit (HOSPITAL_COMMUNITY)
Admission: RE | Admit: 2012-09-29 | Discharge: 2012-09-29 | Disposition: A | Discharge status: Home / Self Care | Payer: Medicare Other | Source: Ambulatory Visit | Attending: Family Medicine | Admitting: Family Medicine

## 2012-09-29 ENCOUNTER — Other Ambulatory Visit: Payer: Self-pay | Admitting: Family Medicine

## 2012-09-29 DIAGNOSIS — M25519 Pain in unspecified shoulder: Secondary | ICD-10-CM

## 2012-09-29 DIAGNOSIS — Z1231 Encounter for screening mammogram for malignant neoplasm of breast: Secondary | ICD-10-CM | POA: Insufficient documentation

## 2012-09-29 DIAGNOSIS — Z139 Encounter for screening, unspecified: Secondary | ICD-10-CM

## 2012-09-29 DIAGNOSIS — M7530 Calcific tendinitis of unspecified shoulder: Secondary | ICD-10-CM | POA: Diagnosis not present

## 2012-12-18 DIAGNOSIS — M129 Arthropathy, unspecified: Secondary | ICD-10-CM | POA: Diagnosis not present

## 2012-12-18 DIAGNOSIS — I10 Essential (primary) hypertension: Secondary | ICD-10-CM | POA: Diagnosis not present

## 2012-12-18 DIAGNOSIS — E119 Type 2 diabetes mellitus without complications: Secondary | ICD-10-CM | POA: Diagnosis not present

## 2012-12-27 ENCOUNTER — Ambulatory Visit: Payer: Medicare Other | Admitting: Neurosurgery

## 2012-12-27 ENCOUNTER — Other Ambulatory Visit: Payer: Medicare Other

## 2013-01-09 ENCOUNTER — Other Ambulatory Visit: Payer: Self-pay | Admitting: *Deleted

## 2013-01-09 ENCOUNTER — Encounter: Payer: Self-pay | Admitting: Vascular Surgery

## 2013-01-09 DIAGNOSIS — Z48812 Encounter for surgical aftercare following surgery on the circulatory system: Secondary | ICD-10-CM

## 2013-01-10 ENCOUNTER — Ambulatory Visit (INDEPENDENT_AMBULATORY_CARE_PROVIDER_SITE_OTHER): Payer: Medicare Other | Admitting: Vascular Surgery

## 2013-01-10 ENCOUNTER — Other Ambulatory Visit: Payer: Self-pay | Admitting: *Deleted

## 2013-01-10 ENCOUNTER — Encounter: Payer: Self-pay | Admitting: Vascular Surgery

## 2013-01-10 ENCOUNTER — Other Ambulatory Visit (INDEPENDENT_AMBULATORY_CARE_PROVIDER_SITE_OTHER): Payer: Medicare Other | Admitting: *Deleted

## 2013-01-10 DIAGNOSIS — Z48812 Encounter for surgical aftercare following surgery on the circulatory system: Secondary | ICD-10-CM | POA: Diagnosis not present

## 2013-01-10 DIAGNOSIS — I6529 Occlusion and stenosis of unspecified carotid artery: Secondary | ICD-10-CM | POA: Diagnosis not present

## 2013-01-10 NOTE — Progress Notes (Signed)
[facility] [place]  Patient name: Kara Kirby MRN: [id number] DOB: 1946-08-17 Sex: female  REASON FOR VISIT: follow up of carotid disease.  HPI: Kara Kirby is a 67 y.o. female who underwent a right carotid endarterectomy in 2004. She comes in for her yearly follow up visit. She denies any history of stroke, TIAs, expressive or receptive aphasia, or amaurosis fugax. Overall she's been doing well she states her cholesterol has been improving. Fortunately, she is not a smoker.  REVIEW OF SYSTEMS: Arly.Keller ] denotes positive finding; [  ] denotes negative finding  CARDIOVASCULAR:  [ ]  chest pain   [ ]  dyspnea on exertion    CONSTITUTIONAL:  [ ]  fever   [ ]  chills  PHYSICAL EXAM: Filed Vitals:   01/10/13 1145 01/10/13 1148  BP: 189/95 171/100  Pulse: 99   Height: 5\' 4"  (1.626 m)   Weight: 196 lb (88.905 kg)   SpO2: 100%    Body mass index is 33.63 kg/(m^2). GENERAL: The patient is a well-nourished female, in no acute distress. The vital signs are documented above. CARDIOVASCULAR: There is a regular rate and rhythm. I do not detect carotid bruits. PULMONARY: There is good air exchange bilaterally without wheezing or rales. NEURO: There is no focal weakness or paresthesias.  MEDICAL ISSUES:  Occlusion and stenosis of carotid artery without mention of cerebral infarction She has a widely patent right carotid endarterectomy site with no significant stenosis on the left. We'll continue with yearly routine follow up carotid duplex scans. I'll see her back if there is evidence significant change in her follow up studies. Otherwise I'll see her as needed. I've encouraged her to continue taking her aspirin. Had a long discussion about the importance of exercise nutrition. Fortunately she is not a smoker.   DICKSON,CHRISTOPHER S [facility] [place] Beeper: [phone]

## 2013-01-10 NOTE — Assessment & Plan Note (Signed)
She has a widely patent right carotid endarterectomy site with no significant stenosis on the left. We'll continue with yearly routine follow up carotid duplex scans. I'll see her back if there is evidence significant change in her follow up studies. Otherwise I'll see her as needed. I've encouraged her to continue taking her aspirin. Had a long discussion about the importance of exercise nutrition. Fortunately she is not a smoker.

## 2013-01-15 ENCOUNTER — Other Ambulatory Visit: Payer: Self-pay | Admitting: *Deleted

## 2013-01-15 MED ORDER — ATORVASTATIN CALCIUM 80 MG PO TABS: 40.0000 mg | ORAL_TABLET | Freq: Every day | ORAL | Status: DC

## 2013-01-15 MED ORDER — DOXAZOSIN MESYLATE 4 MG PO TABS: 4.0000 mg | ORAL_TABLET | Freq: Every day | ORAL | Status: DC

## 2013-01-31 ENCOUNTER — Telehealth: Payer: Self-pay | Admitting: Family Medicine

## 2013-01-31 ENCOUNTER — Other Ambulatory Visit: Payer: Self-pay | Admitting: *Deleted

## 2013-01-31 MED ORDER — DOXAZOSIN MESYLATE 4 MG PO TABS: 8.0000 mg | ORAL_TABLET | Freq: Every day | ORAL | Status: DC

## 2013-01-31 NOTE — Telephone Encounter (Signed)
Med sent electronically to [facility] in [place] . Patient aware.

## 2013-01-31 NOTE — Telephone Encounter (Signed)
Patient needs a refill of her Doxazosin 4 mg 2 tab a day to [facility] in [place]

## 2013-02-01 DIAGNOSIS — H2589 Other age-related cataract: Secondary | ICD-10-CM | POA: Diagnosis not present

## 2013-02-01 DIAGNOSIS — E119 Type 2 diabetes mellitus without complications: Secondary | ICD-10-CM | POA: Diagnosis not present

## 2013-02-01 DIAGNOSIS — H40009 Preglaucoma, unspecified, unspecified eye: Secondary | ICD-10-CM | POA: Diagnosis not present

## 2013-02-01 DIAGNOSIS — H3581 Retinal edema: Secondary | ICD-10-CM | POA: Diagnosis not present

## 2013-02-01 DIAGNOSIS — H348392 Tributary (branch) retinal vein occlusion, unspecified eye, stable: Secondary | ICD-10-CM | POA: Diagnosis not present

## 2013-02-09 ENCOUNTER — Encounter: Payer: Self-pay | Admitting: Nurse Practitioner

## 2013-02-09 ENCOUNTER — Ambulatory Visit (INDEPENDENT_AMBULATORY_CARE_PROVIDER_SITE_OTHER): Payer: Medicare Other | Admitting: Nurse Practitioner

## 2013-02-09 VITALS — BP 148/90 | Temp 98.7°F | Wt 198.0 lb

## 2013-02-09 DIAGNOSIS — J0110 Acute frontal sinusitis, unspecified: Secondary | ICD-10-CM | POA: Diagnosis not present

## 2013-02-09 DIAGNOSIS — J209 Acute bronchitis, unspecified: Secondary | ICD-10-CM | POA: Diagnosis not present

## 2013-02-09 MED ORDER — CEFPROZIL 500 MG PO TABS: 500.0000 mg | ORAL_TABLET | Freq: Two times a day (BID) | ORAL | Status: DC

## 2013-02-09 MED ORDER — BENZONATATE 100 MG PO CAPS: ORAL_CAPSULE | ORAL | Status: DC

## 2013-02-09 MED ORDER — FLUCONAZOLE 150 MG PO TABS: 150.0000 mg | ORAL_TABLET | Freq: Once | ORAL | Status: DC

## 2013-02-10 ENCOUNTER — Encounter: Payer: Self-pay | Admitting: Nurse Practitioner

## 2013-02-10 NOTE — Progress Notes (Signed)
Subjective:  Presents complaints of sinus congestion for the past week. Had a fever which has resolved. Coughed up a small amount of bright red blood with her mucus today x1. None since. No nosebleeds. Bad breath noticed. Sore throat. Right ear pain. Cough is improved but still producing slightly yellow mucus. Frontal area headache. Slight wheeze at times. Has not used an inhaler.  Objective:   BP 148/90  Temp(Src) 98.7 F (37.1 C) (Oral)  Wt 198 lb (89.812 kg)  BMI 33.97 kg/m2 NAD. Alert, oriented. TMs significant clear effusion, no erythema. Nasal mucosa pale and boggy, no active bleeding noted. Pharynx erythematous with green PND noted. Neck supple with mild soft adenopathy. Lungs faint scattered expiratory rhonchi, no wheezing or tachypnea. Heart regular rate rhythm.   Assessment:Acute frontal sinusitis  Acute bronchitis  Plan: Cefzil 500 mg 1 by mouth twice a day x10 days. Tessalon Perles as directed for cough. Diflucan prescribed when necessary yeast infection. OTC meds as directed for congestion. Callback in 7-10 days if no improvement, sooner if worse.

## 2013-02-15 ENCOUNTER — Other Ambulatory Visit (HOSPITAL_COMMUNITY): Payer: Self-pay | Admitting: Family Medicine

## 2013-02-15 ENCOUNTER — Other Ambulatory Visit: Payer: Self-pay | Admitting: *Deleted

## 2013-02-15 ENCOUNTER — Encounter: Payer: Self-pay | Admitting: *Deleted

## 2013-02-15 MED ORDER — ATORVASTATIN CALCIUM 40 MG PO TABS: 40.0000 mg | ORAL_TABLET | Freq: Every day | ORAL | Status: DC

## 2013-02-18 ENCOUNTER — Inpatient Hospital Stay (HOSPITAL_COMMUNITY)
Admission: EM | Admit: 2013-02-18 | Discharge: 2013-02-20 | DRG: 066 | Disposition: A | Discharge status: Home / Self Care | Payer: Medicare Other | Attending: Internal Medicine | Admitting: Internal Medicine

## 2013-02-18 ENCOUNTER — Emergency Department (HOSPITAL_COMMUNITY): Payer: Medicare Other

## 2013-02-18 ENCOUNTER — Encounter (HOSPITAL_COMMUNITY): Payer: Self-pay | Admitting: Emergency Medicine

## 2013-02-18 DIAGNOSIS — E669 Obesity, unspecified: Secondary | ICD-10-CM | POA: Diagnosis present

## 2013-02-18 DIAGNOSIS — Z7982 Long term (current) use of aspirin: Secondary | ICD-10-CM | POA: Diagnosis not present

## 2013-02-18 DIAGNOSIS — E1169 Type 2 diabetes mellitus with other specified complication: Secondary | ICD-10-CM

## 2013-02-18 DIAGNOSIS — I6350 Cerebral infarction due to unspecified occlusion or stenosis of unspecified cerebral artery: Secondary | ICD-10-CM | POA: Diagnosis not present

## 2013-02-18 DIAGNOSIS — Z6835 Body mass index (BMI) 35.0-35.9, adult: Secondary | ICD-10-CM

## 2013-02-18 DIAGNOSIS — E785 Hyperlipidemia, unspecified: Secondary | ICD-10-CM

## 2013-02-18 DIAGNOSIS — Z90710 Acquired absence of both cervix and uterus: Secondary | ICD-10-CM | POA: Diagnosis not present

## 2013-02-18 DIAGNOSIS — I639 Cerebral infarction, unspecified: Secondary | ICD-10-CM

## 2013-02-18 DIAGNOSIS — K219 Gastro-esophageal reflux disease without esophagitis: Secondary | ICD-10-CM | POA: Diagnosis not present

## 2013-02-18 DIAGNOSIS — Z91041 Radiographic dye allergy status: Secondary | ICD-10-CM | POA: Diagnosis not present

## 2013-02-18 DIAGNOSIS — R4182 Altered mental status, unspecified: Secondary | ICD-10-CM | POA: Diagnosis not present

## 2013-02-18 DIAGNOSIS — E7800 Pure hypercholesterolemia, unspecified: Secondary | ICD-10-CM | POA: Diagnosis present

## 2013-02-18 DIAGNOSIS — I63239 Cerebral infarction due to unspecified occlusion or stenosis of unspecified carotid arteries: Secondary | ICD-10-CM | POA: Diagnosis not present

## 2013-02-18 DIAGNOSIS — J9819 Other pulmonary collapse: Secondary | ICD-10-CM | POA: Diagnosis not present

## 2013-02-18 DIAGNOSIS — I6529 Occlusion and stenosis of unspecified carotid artery: Secondary | ICD-10-CM | POA: Diagnosis present

## 2013-02-18 DIAGNOSIS — I672 Cerebral atherosclerosis: Secondary | ICD-10-CM | POA: Diagnosis not present

## 2013-02-18 DIAGNOSIS — Z79899 Other long term (current) drug therapy: Secondary | ICD-10-CM | POA: Diagnosis not present

## 2013-02-18 DIAGNOSIS — Z8249 Family history of ischemic heart disease and other diseases of the circulatory system: Secondary | ICD-10-CM | POA: Diagnosis not present

## 2013-02-18 DIAGNOSIS — I6340 Cerebral infarction due to embolism of unspecified cerebral artery: Secondary | ICD-10-CM | POA: Diagnosis not present

## 2013-02-18 DIAGNOSIS — N289 Disorder of kidney and ureter, unspecified: Secondary | ICD-10-CM | POA: Diagnosis present

## 2013-02-18 DIAGNOSIS — I369 Nonrheumatic tricuspid valve disorder, unspecified: Secondary | ICD-10-CM | POA: Diagnosis not present

## 2013-02-18 DIAGNOSIS — Z833 Family history of diabetes mellitus: Secondary | ICD-10-CM | POA: Diagnosis not present

## 2013-02-18 DIAGNOSIS — R269 Unspecified abnormalities of gait and mobility: Secondary | ICD-10-CM | POA: Diagnosis not present

## 2013-02-18 DIAGNOSIS — R5381 Other malaise: Secondary | ICD-10-CM | POA: Diagnosis not present

## 2013-02-18 DIAGNOSIS — I10 Essential (primary) hypertension: Secondary | ICD-10-CM | POA: Diagnosis not present

## 2013-02-18 DIAGNOSIS — M1610 Unilateral primary osteoarthritis, unspecified hip: Secondary | ICD-10-CM | POA: Diagnosis present

## 2013-02-18 DIAGNOSIS — Z8673 Personal history of transient ischemic attack (TIA), and cerebral infarction without residual deficits: Secondary | ICD-10-CM | POA: Diagnosis not present

## 2013-02-18 DIAGNOSIS — E119 Type 2 diabetes mellitus without complications: Secondary | ICD-10-CM

## 2013-02-18 DIAGNOSIS — M169 Osteoarthritis of hip, unspecified: Secondary | ICD-10-CM | POA: Diagnosis not present

## 2013-02-18 DIAGNOSIS — R29818 Other symptoms and signs involving the nervous system: Secondary | ICD-10-CM | POA: Diagnosis not present

## 2013-02-18 DIAGNOSIS — R5383 Other fatigue: Secondary | ICD-10-CM | POA: Diagnosis not present

## 2013-02-18 DIAGNOSIS — IMO0001 Reserved for inherently not codable concepts without codable children: Secondary | ICD-10-CM | POA: Diagnosis present

## 2013-02-18 LAB — CBC WITH DIFFERENTIAL/PLATELET
Eosinophils Absolute: 0.1 10*3/uL (ref 0.0–0.7)
Eosinophils Relative: 2 % (ref 0–5)
HCT: 38.8 % (ref 36.0–46.0)
Hemoglobin: 13.2 g/dL (ref 12.0–15.0)
Lymphs Abs: 3.2 10*3/uL (ref 0.7–4.0)
MCH: 29.6 pg (ref 26.0–34.0)
MCV: 87 fL (ref 78.0–100.0)
Monocytes Absolute: 0.7 10*3/uL (ref 0.1–1.0)
Monocytes Relative: 9 % (ref 3–12)
Neutrophils Relative %: 46 % (ref 43–77)
RBC: 4.46 MIL/uL (ref 3.87–5.11)

## 2013-02-18 LAB — URINALYSIS, ROUTINE W REFLEX MICROSCOPIC
Ketones, ur: NEGATIVE mg/dL
Leukocytes, UA: NEGATIVE
Nitrite: NEGATIVE
Protein, ur: NEGATIVE mg/dL
pH: 6 (ref 5.0–8.0)

## 2013-02-18 LAB — COMPREHENSIVE METABOLIC PANEL
Alkaline Phosphatase: 67 U/L (ref 39–117)
BUN: 17 mg/dL (ref 6–23)
Creatinine, Ser: 0.9 mg/dL (ref 0.50–1.10)
GFR calc Af Amer: 75 mL/min — ABNORMAL LOW (ref >90)
Glucose, Bld: 143 mg/dL — ABNORMAL HIGH (ref 70–99)
Potassium: 3.5 mEq/L (ref 3.5–5.1)
Total Bilirubin: 0.4 mg/dL (ref 0.3–1.2)
Total Protein: 7.5 g/dL (ref 6.0–8.3)

## 2013-02-18 LAB — GLUCOSE, CAPILLARY: Glucose-Capillary: 135 mg/dL — ABNORMAL HIGH (ref 70–99)

## 2013-02-18 MED ORDER — SODIUM CHLORIDE 0.9 % IV SOLN
INTRAVENOUS | Status: DC
  Administered 2013-02-18 – 2013-02-20 (×4): via INTRAVENOUS

## 2013-02-18 MED ORDER — LABETALOL HCL 5 MG/ML IV SOLN
20.0000 mg | Freq: Once | INTRAVENOUS | Status: AC
  Administered 2013-02-18: 20 mg via INTRAVENOUS
  Filled 2013-02-18: qty 4

## 2013-02-18 NOTE — ED Notes (Signed)
No change in pt status. Waiting for admission

## 2013-02-18 NOTE — ED Provider Notes (Signed)
History   This chart was scribed for Osvaldo Human, MD by Jiles Prows, ED Scribe. The patient was seen in room APA19/APA19 and the patient's care was started at 4:38 PM.   CSN: [id number]  Arrival date & time 02/18/13  1551  Chief Complaint  Patient presents with  . Altered Mental Status   The history is provided by the patient and medical records. No language interpreter was used.   HPI Comments: Kara Kirby is a 67 y.o. female with a hx of stroke, high cholesterol, DM, and HTN who presents to the Emergency Department complaining of persistent, moderate left leg weakness that began about 40 minutes ago.  Pt reports she could not put weight on her left leg, but denies leg pain.  Pt reports that she had some trouble with her speech this afternoon, and she is still dealing with sinusitis, earache, and sore throat that was treated last week. Pt denies dysuria, constipation, syncope, visual disturbances headache, diaphoresis, fever, chills, nausea, vomiting, diarrhea, SOB and any other pain. Pt does not smoke or drink.  Pt had carotid surgery in 2004.  Past Medical History  Diagnosis Date  . DM (diabetes mellitus)   . HTN (hypertension)   . Sleep related leg cramps   . Hyperlipidemia   . GERD (gastroesophageal reflux disease)     intermittent Sx  . CVA (cerebral vascular accident)   . Allergy   . Hematuria   . Renal insufficiency, mild   . Hip arthritis 2003    Left  . Dyslipidemia     Past Surgical History  Procedure Laterality Date  . S/p hysterectomy    . Colonoscopy    . Total abdominal hysterectomy      FIBROID, HYPERMENORRHEA  . Carotid endarterectomy  RIGHT  . Colonoscopy  10/19/2011    Procedure: COLONOSCOPY;  Surgeon: Arlyce Harman, MD;  Location: [facility];  Service: Endoscopy;  Laterality: N/A;  10:15    Family History  Problem Relation Age of Onset  . Colon cancer Neg Hx   . Colon polyps Neg Hx   . Hypertension Mother   . Heart attack Mother    . Hypertension Sister   . Diabetes Sister   . Diabetes Sister   . Hypertension Sister     History  Substance Use Topics  . Smoking status: Never Smoker   . Smokeless tobacco: Never Used  . Alcohol Use: No    OB History   Grav Para Term Preterm Abortions TAB SAB Ect Mult Living                  Review of Systems  Constitutional: Negative for fever and chills.  HENT: Positive for ear pain and sore throat.   Respiratory: Positive for cough. Negative for shortness of breath.   Neurological: Positive for speech difficulty and weakness (left sided). Negative for syncope.  All other systems reviewed and are negative.    Allergies  Other  Home Medications   Current Outpatient Rx  Name  Route  Sig  Dispense  Refill  . aspirin 81 MG tablet   Oral   Take 81 mg by mouth daily.           Marland Kitchen atorvastatin (LIPITOR) 40 MG tablet   Oral   Take 1 tablet (40 mg total) by mouth daily.   30 tablet   1   . benzonatate (TESSALON) 100 MG capsule      1-2 po TID prn cough  30 capsule   0   . cefPROZIL (CEFZIL) 500 MG tablet   Oral   Take 1 tablet (500 mg total) by mouth 2 (two) times daily.   20 tablet   0   . Cyclobenzaprine HCl (FLEXERIL PO)   Oral   Take 15 mg by mouth daily.           Marland Kitchen doxazosin (CARDURA) 4 MG tablet   Oral   Take 2 tablets (8 mg total) by mouth at bedtime.   60 tablet   3   . fluconazole (DIFLUCAN) 150 MG tablet   Oral   Take 1 tablet (150 mg total) by mouth once.   2 tablet   0   . glipiZIDE (GLUCOTROL) 5 MG tablet   Oral   Take 1 tablet by mouth 2 (two) times daily.         . hydrochlorothiazide (HYDRODIURIL) 25 MG tablet   Oral   Take 25 mg by mouth daily.          Marland Kitchen KLOR-CON M10 10 MEQ tablet   Oral   Take 10 mEq by mouth 2 (two) times daily.          Marland Kitchen lisinopril (PRINIVIL,ZESTRIL) 20 MG tablet   Oral   Take 20 mg by mouth. Take 2 qam         . metFORMIN (GLUCOPHAGE) 500 MG tablet   Oral   Take 500 mg by mouth  2 (two) times daily with a meal.          . NIFEDIAC CC 60 MG 24 hr tablet   Oral   Take 60 mg by mouth daily.          . ranitidine (ZANTAC) 300 MG tablet   Oral   Take 300 mg by mouth. qam         . verapamil (VERELAN PM) 240 MG 24 hr capsule   Oral   Take 1 capsule by mouth daily.         Marland Kitchen zolpidem (AMBIEN) 10 MG tablet                 BP 204/107  Pulse 125  Temp(Src) 98.4 F (36.9 C)  Resp 18  Ht 5\' 4"  (1.626 m)  Wt 198 lb (89.812 kg)  BMI 33.97 kg/m2  SpO2 97%  Physical Exam  Nursing note and vitals reviewed. Constitutional: She is oriented to person, place, and time. She appears well-developed and well-nourished.  HENT:  Head: Normocephalic and atraumatic.  Right Ear: External ear normal.  Left Ear: External ear normal.  Mouth/Throat: Oropharynx is clear and moist.  Eyes: Conjunctivae and EOM are normal. Pupils are equal, round, and reactive to light.  Neck: Normal range of motion. Neck supple. Carotid bruit is not present.  Cardiovascular: Normal rate, regular rhythm and normal heart sounds.   No murmur heard. Blood Pressure notably high (204/107).  Pulmonary/Chest: Effort normal and breath sounds normal.  Abdominal: Soft. Bowel sounds are normal. She exhibits no distension. There is no tenderness.  Musculoskeletal: Normal range of motion.  Full ROM of both hips.  Neurological: She is alert and oriented to person, place, and time. She has normal strength. No cranial nerve deficit.  Sensation intact bilaterally.  Skin: Skin is warm and dry.    ED Course  Procedures (including critical care time) DIAGNOSTIC STUDIES: Oxygen Saturation is 97% on RA, adequate by my interpretation.    COORDINATION OF CARE: 4:44 PM - Discussed ED  treatment with pt at bedside including HTN medicine and labs and pt agrees.   Results for orders placed during the hospital encounter of 02/18/13  GLUCOSE, CAPILLARY      Result Value Range   Glucose-Capillary 145 (*)  70 - 99 mg/dL  CBC WITH DIFFERENTIAL      Result Value Range   WBC 7.4  4.0 - 10.5 K/uL   RBC 4.46  3.87 - 5.11 MIL/uL   Hemoglobin 13.2  12.0 - 15.0 g/dL   HCT 40.9  81.1 - 91.4 %   MCV 87.0  78.0 - 100.0 fL   MCH 29.6  26.0 - 34.0 pg   MCHC 34.0  30.0 - 36.0 g/dL   RDW 78.2  95.6 - 21.3 %   Platelets 287  150 - 400 K/uL   Neutrophils Relative 46  43 - 77 %   Neutro Abs 3.4  1.7 - 7.7 K/uL   Lymphocytes Relative 43  12 - 46 %   Lymphs Abs 3.2  0.7 - 4.0 K/uL   Monocytes Relative 9  3 - 12 %   Monocytes Absolute 0.7  0.1 - 1.0 K/uL   Eosinophils Relative 2  0 - 5 %   Eosinophils Absolute 0.1  0.0 - 0.7 K/uL   Basophils Relative 0  0 - 1 %   Basophils Absolute 0.0  0.0 - 0.1 K/uL  URINALYSIS, ROUTINE W REFLEX MICROSCOPIC      Result Value Range   Color, Urine YELLOW  YELLOW   APPearance CLEAR  CLEAR   Specific Gravity, Urine 1.015  1.005 - 1.030   pH 6.0  5.0 - 8.0   Glucose, UA NEGATIVE  NEGATIVE mg/dL   Hgb urine dipstick SMALL (*) NEGATIVE   Bilirubin Urine NEGATIVE  NEGATIVE   Ketones, ur NEGATIVE  NEGATIVE mg/dL   Protein, ur NEGATIVE  NEGATIVE mg/dL   Urobilinogen, UA 0.2  0.0 - 1.0 mg/dL   Nitrite NEGATIVE  NEGATIVE   Leukocytes, UA NEGATIVE  NEGATIVE  PROTIME-INR      Result Value Range   Prothrombin Time 12.2  11.6 - 15.2 seconds   INR 0.91  0.00 - 1.49  APTT      Result Value Range   aPTT 28  24 - 37 seconds  GLUCOSE, CAPILLARY      Result Value Range   Glucose-Capillary 128 (*) 70 - 99 mg/dL  URINE MICROSCOPIC-ADD ON      Result Value Range   RBC / HPF 0-2  <3 RBC/hpf   No results found.     Dg Chest 2 View  02/18/2013  *RADIOLOGY REPORT*  Clinical Data: Hypertension.  Not feeling well.  CHEST - 2 VIEW  Comparison: 02/04/2010  Findings: Mild cardiomegaly.  Minimal bibasilar opacities, likely atelectasis.  Left lung is clear.  No effusions or acute bony abnormality.  IMPRESSION: Right base atelectasis.  Borderline cardiomegaly.   Original Report  Authenticated By: Charlett Nose, M.D.    Dg Hip Complete Left  02/18/2013  *RADIOLOGY REPORT*  Clinical Data: Left hip pain.  No injury.  LEFT HIP - COMPLETE 2+ VIEW  Comparison: None.  Findings: Pannus projects over the hips.  There is no fracture. The hip joint spaces appear symmetric.  Hips are located.  Pubic symphysis degenerative disease.  Phleboliths in the pelvis.  There is "whiskering" of the iliac crest compatible with diffuse idiopathic skeletal hyperostosis.  Tiny marginal osteophytes are present off the inferior margin of the left femoral head.  IMPRESSION: Mild left hip osteoarthritis.   Original Report Authenticated By: Andreas Newport, M.D.    Ct Head (brain) Wo Contrast  02/18/2013  *RADIOLOGY REPORT*  Clinical Data: Altered mental status.  Left-sided weakness.  CT HEAD WITHOUT CONTRAST  Technique:  Contiguous axial images were obtained from the base of the skull through the vertex without contrast.  Comparison: None.  Findings: Low density area noted in the right parietal or posterior temporal lobe compatible with subacute infarction.  No hemorrhage. No mass effect midline shift.  No hydrocephalus.  No acute calvarial abnormality. Visualized paranasal sinuses and mastoids clear.  Orbital soft tissues unremarkable.  IMPRESSION: Likely subacute infarction in the posterior right temporal or parietal lobe.   Original Report Authenticated By: Charlett Nose, M.D.     Date: 02/18/2013  Rate: 113  Rhythm: sinus tachycardia  QRS Axis: left  Intervals: normal QRS:  Poor R wave progression in precordial leads suggests old anterior myocardial infarction.  Q waves in inferior leads suggest old inferior myocardial infarction.  ST/T Wave abnormalities: normal  Conduction Disutrbances:none  Narrative Interpretation: Abnormal EKG  Old EKG Reviewed: none available  1. CVA (cerebral infarction)   2. Hypertension     6:58 PM - Discussed lab results with patient including right temporal or parietal  stroke.  Neurology will be consulted for further evaluation.  9:05 PM Prolonged wait for teleneurology.  Will ask [facility] to admit her to complete her stroke workup, as she does not appear to be a candidate for a neuro intervention.  Case discussed with Julaine Fusi, M.D.   Admit to a telemetry bed.  I personally performed the services described in this documentation, which was scribed in my presence. The recorded information has been reviewed and is accurate.  Osvaldo Human, MD      Carleene Cooper III, MD 02/18/13 [id number]

## 2013-02-18 NOTE — ED Notes (Addendum)
Pt states she began "not feeling right" x 30 minutes ago. Pt c/o feeling that her "left leg is giving out". Pt ambulated from triage to stretcher in back. Pt also states that she feels that "the light is dim in here", denies blurry vision. Denies ha/dizzyness/cp/sob. Pt alert and oriented x 4 , no neuro deficits noted. nad noted.

## 2013-02-18 NOTE — ED Notes (Signed)
Pt is worried that her "sugar might drop"  States she hasn't eaten all day. cbg done and meal given

## 2013-02-18 NOTE — ED Notes (Signed)
Fully alert and oriented with no deficits noted. Has been up to bathroom several times and has not gait disturbance. Waiting for admission process now

## 2013-02-19 ENCOUNTER — Inpatient Hospital Stay (HOSPITAL_COMMUNITY): Payer: Medicare Other

## 2013-02-19 DIAGNOSIS — I6350 Cerebral infarction due to unspecified occlusion or stenosis of unspecified cerebral artery: Secondary | ICD-10-CM

## 2013-02-19 DIAGNOSIS — I369 Nonrheumatic tricuspid valve disorder, unspecified: Secondary | ICD-10-CM

## 2013-02-19 DIAGNOSIS — E785 Hyperlipidemia, unspecified: Secondary | ICD-10-CM

## 2013-02-19 DIAGNOSIS — I639 Cerebral infarction, unspecified: Secondary | ICD-10-CM

## 2013-02-19 DIAGNOSIS — E119 Type 2 diabetes mellitus without complications: Secondary | ICD-10-CM | POA: Diagnosis present

## 2013-02-19 DIAGNOSIS — I10 Essential (primary) hypertension: Secondary | ICD-10-CM | POA: Diagnosis present

## 2013-02-19 DIAGNOSIS — E1169 Type 2 diabetes mellitus with other specified complication: Secondary | ICD-10-CM

## 2013-02-19 HISTORY — DX: Cerebral infarction, unspecified: I63.9

## 2013-02-19 LAB — LIPID PANEL
Cholesterol: 184 mg/dL (ref 0–200)
Triglycerides: 130 mg/dL (ref <150)
VLDL: 26 mg/dL (ref 0–40)

## 2013-02-19 LAB — GLUCOSE, CAPILLARY: Glucose-Capillary: 172 mg/dL — ABNORMAL HIGH (ref 70–99)

## 2013-02-19 LAB — HEMOGLOBIN A1C
Hgb A1c MFr Bld: 7.1 % — ABNORMAL HIGH (ref <5.7)
Hgb A1c MFr Bld: 6.9 % — ABNORMAL HIGH (ref <5.7)
Mean Plasma Glucose: 157 mg/dL — ABNORMAL HIGH (ref <117)
Mean Plasma Glucose: 151 mg/dL — ABNORMAL HIGH (ref <117)

## 2013-02-19 MED ORDER — ONDANSETRON HCL 4 MG/2ML IJ SOLN: 4.0000 mg | Freq: Four times a day (QID) | INTRAMUSCULAR | Status: DC | PRN

## 2013-02-19 MED ORDER — POTASSIUM CHLORIDE CRYS ER 10 MEQ PO TBCR
10.0000 meq | EXTENDED_RELEASE_TABLET | Freq: Two times a day (BID) | ORAL | Status: DC
  Administered 2013-02-19 – 2013-02-20 (×4): 10 meq via ORAL
  Filled 2013-02-19 (×4): qty 1

## 2013-02-19 MED ORDER — LISINOPRIL 10 MG PO TABS
40.0000 mg | ORAL_TABLET | Freq: Every day | ORAL | Status: DC
  Administered 2013-02-19 – 2013-02-20 (×2): 40 mg via ORAL
  Filled 2013-02-19 (×2): qty 4

## 2013-02-19 MED ORDER — DOXAZOSIN MESYLATE 8 MG PO TABS
8.0000 mg | ORAL_TABLET | Freq: Every day | ORAL | Status: DC
  Administered 2013-02-19 (×2): 8 mg via ORAL
  Filled 2013-02-19 (×2): qty 1

## 2013-02-19 MED ORDER — ACETAMINOPHEN 325 MG PO TABS
650.0000 mg | ORAL_TABLET | ORAL | Status: DC | PRN
  Administered 2013-02-20: 650 mg via ORAL
  Filled 2013-02-19: qty 2

## 2013-02-19 MED ORDER — SENNOSIDES-DOCUSATE SODIUM 8.6-50 MG PO TABS: 1.0000 | ORAL_TABLET | Freq: Every evening | ORAL | Status: DC | PRN

## 2013-02-19 MED ORDER — ENOXAPARIN SODIUM 40 MG/0.4ML ~~LOC~~ SOLN
40.0000 mg | SUBCUTANEOUS | Status: DC
  Administered 2013-02-19 – 2013-02-20 (×2): 40 mg via SUBCUTANEOUS
  Filled 2013-02-19 (×2): qty 0.4

## 2013-02-19 MED ORDER — ATORVASTATIN CALCIUM 40 MG PO TABS
80.0000 mg | ORAL_TABLET | Freq: Every day | ORAL | Status: DC
  Administered 2013-02-20: 80 mg via ORAL
  Filled 2013-02-19: qty 2

## 2013-02-19 MED ORDER — ACETAMINOPHEN 650 MG RE SUPP: 650.0000 mg | RECTAL | Status: DC | PRN

## 2013-02-19 MED ORDER — ASPIRIN 300 MG RE SUPP
300.0000 mg | Freq: Every day | RECTAL | Status: DC
  Filled 2013-02-19 (×3): qty 1

## 2013-02-19 MED ORDER — ATORVASTATIN CALCIUM 40 MG PO TABS
40.0000 mg | ORAL_TABLET | Freq: Every day | ORAL | Status: DC
  Administered 2013-02-19: 40 mg via ORAL
  Filled 2013-02-19: qty 1

## 2013-02-19 MED ORDER — VERAPAMIL HCL ER 240 MG PO TBCR
240.0000 mg | EXTENDED_RELEASE_TABLET | Freq: Every day | ORAL | Status: DC
  Administered 2013-02-19: 240 mg via ORAL
  Filled 2013-02-19: qty 2
  Filled 2013-02-19: qty 1

## 2013-02-19 MED ORDER — HYDROCHLOROTHIAZIDE 25 MG PO TABS
25.0000 mg | ORAL_TABLET | Freq: Every day | ORAL | Status: DC
  Administered 2013-02-19 – 2013-02-20 (×2): 25 mg via ORAL
  Filled 2013-02-19 (×2): qty 1

## 2013-02-19 MED ORDER — ZOLPIDEM TARTRATE 5 MG PO TABS: 10.0000 mg | ORAL_TABLET | Freq: Every evening | ORAL | Status: DC | PRN

## 2013-02-19 MED ORDER — SODIUM CHLORIDE 0.9 % IV SOLN: INTRAVENOUS | Status: DC

## 2013-02-19 MED ORDER — AMLODIPINE BESYLATE 5 MG PO TABS
5.0000 mg | ORAL_TABLET | Freq: Every day | ORAL | Status: DC
  Administered 2013-02-20: 5 mg via ORAL
  Filled 2013-02-19: qty 1

## 2013-02-19 MED ORDER — NIFEDIPINE ER OSMOTIC RELEASE 30 MG PO TB24
60.0000 mg | ORAL_TABLET | Freq: Every day | ORAL | Status: DC
  Administered 2013-02-19: 60 mg via ORAL
  Filled 2013-02-19: qty 2

## 2013-02-19 MED ORDER — ZOLPIDEM TARTRATE 5 MG PO TABS
5.0000 mg | ORAL_TABLET | Freq: Every evening | ORAL | Status: DC | PRN
  Administered 2013-02-19 (×2): 5 mg via ORAL
  Filled 2013-02-19 (×2): qty 1

## 2013-02-19 MED ORDER — ASPIRIN 325 MG PO TABS
325.0000 mg | ORAL_TABLET | Freq: Every day | ORAL | Status: DC
  Administered 2013-02-19 – 2013-02-20 (×2): 325 mg via ORAL
  Filled 2013-02-19 (×2): qty 1

## 2013-02-19 MED ORDER — INSULIN ASPART 100 UNIT/ML ~~LOC~~ SOLN
0.0000 [IU] | Freq: Three times a day (TID) | SUBCUTANEOUS | Status: DC
  Administered 2013-02-19 (×3): 2 [IU] via SUBCUTANEOUS
  Administered 2013-02-20 (×2): 3 [IU] via SUBCUTANEOUS

## 2013-02-19 MED ORDER — INSULIN ASPART 100 UNIT/ML ~~LOC~~ SOLN
4.0000 [IU] | Freq: Three times a day (TID) | SUBCUTANEOUS | Status: DC
  Administered 2013-02-19 – 2013-02-20 (×6): 4 [IU] via SUBCUTANEOUS

## 2013-02-19 NOTE — Evaluation (Signed)
Physical Therapy Evaluation Patient Details Name: LAKAISHA DANISH MRN: [id number] DOB: 09-Jul-1946 Today's Date: 02/19/2013 Time: 4098-1191 PT Time Calculation (min): 21 min  PT Assessment / Plan / Recommendation Clinical Impression  Pt was seen for evaluation and found to be at prior level of function.  She has no problems with strength, mobility or balance.  No further PT should be needed.    PT Assessment       Follow Up Recommendations  No PT follow up    Does the patient have the potential to tolerate intense rehabilitation      Barriers to Discharge        Equipment Recommendations  None recommended by PT    Recommendations for Other Services     Frequency      Precautions / Restrictions Precautions Precautions: None Restrictions Weight Bearing Restrictions: No   Pertinent Vitals/Pain       Mobility  Bed Mobility Bed Mobility: Sit to Supine;Supine to Sit Supine to Sit: 7: Independent;HOB flat Sit to Supine: 7: Independent;HOB flat Transfers Transfers: Sit to Stand;Stand to Sit Sit to Stand: 7: Independent;From bed;Without upper extremity assist Stand to Sit: 7: Independent;To bed;Without upper extremity assist Ambulation/Gait Ambulation/Gait Assistance: 7: Independent Ambulation Distance (Feet): 100 Feet Assistive device: None Gait Pattern: Within Functional Limits Gait velocity: WNL Stairs: No Wheelchair Mobility Wheelchair Mobility: No Modified Rankin (Stroke Patients Only) Pre-Morbid Rankin Score: No symptoms Modified Rankin: No symptoms    Exercises     PT Diagnosis:    PT Problem List:   PT Treatment Interventions:     PT Goals    Visit Information  Last PT Received On: 02/19/13 PT/OT Co-Evaluation/Treatment: Yes    Subjective Data  Subjective: feels much better Patient Stated Goal: return home   Prior Functioning       Cognition       Extremity/Trunk Assessment Right Lower Extremity Assessment RLE ROM/Strength/Tone:  Within functional levels RLE Sensation: WFL - Light Touch;WFL - Proprioception RLE Coordination: WFL - gross motor Left Lower Extremity Assessment LLE ROM/Strength/Tone: Within functional levels LLE Sensation: WFL - Light Touch;WFL - Proprioception LLE Coordination: WFL - gross motor Trunk Assessment Trunk Assessment: Normal   Balance Balance Balance Assessed: Yes High Level Balance High Level Balance Activites: Side stepping;Backward walking;Direction changes;Turns;Sudden stops;Head turns High Level Balance Comments: no LOB with above  End of Session PT - End of Session Equipment Utilized During Treatment: Gait belt Activity Tolerance: Patient tolerated treatment well Patient left: in bed Nurse Communication: Mobility status  GP     Konrad Penta 02/19/2013, 1:18 PM

## 2013-02-19 NOTE — Progress Notes (Signed)
Subjective: This lady came in with left leg weakness. She has an acute CVA in the right hemisphere affecting the posterior putamen, internal capsule and periventricular white matter. She is undergoing the workup for CVA. She is stable. She thinks that the leg weakness has improved.           Physical Exam: Blood pressure 181/95, pulse 81, temperature 97.8 F (36.6 C), temperature source Oral, resp. rate 18, height 5\' 3"  (1.6 m), weight 89.9 kg (198 lb 3.1 oz), SpO2 100.00%. She looks systemically well. She is alert and oriented. There is no cranial nerve weakness. In fact her arms and legs are good strength now even the left leg. There does not appear to be any focal neurological signs. There are no carotid bruits. Heart sounds are present and normal.   Investigations:  No results found for this or any previous visit (from the past 240 hour(s)).   Basic Metabolic Panel:  Recent Labs  19/14/78 1655  NA 138  K 3.5  CL 100  CO2 25  GLUCOSE 143*  BUN 17  CREATININE 0.90  CALCIUM 9.6   Liver Function Tests:  Recent Labs  02/18/13 1655  AST 14  ALT 13  ALKPHOS 67  BILITOT 0.4  PROT 7.5  ALBUMIN 4.1     CBC:  Recent Labs  02/18/13 1655  WBC 7.4  NEUTROABS 3.4  HGB 13.2  HCT 38.8  MCV 87.0  PLT 287    Dg Chest 2 View  02/18/2013  *RADIOLOGY REPORT*  Clinical Data: Hypertension.  Not feeling well.  CHEST - 2 VIEW  Comparison: 02/04/2010  Findings: Mild cardiomegaly.  Minimal bibasilar opacities, likely atelectasis.  Left lung is clear.  No effusions or acute bony abnormality.  IMPRESSION: Right base atelectasis.  Borderline cardiomegaly.   Original Report Authenticated By: Charlett Nose, M.D.    Dg Hip Complete Left  02/18/2013  *RADIOLOGY REPORT*  Clinical Data: Left hip pain.  No injury.  LEFT HIP - COMPLETE 2+ VIEW  Comparison: None.  Findings: Pannus projects over the hips.  There is no fracture. The hip joint spaces appear symmetric.  Hips are  located.  Pubic symphysis degenerative disease.  Phleboliths in the pelvis.  There is "whiskering" of the iliac crest compatible with diffuse idiopathic skeletal hyperostosis.  Tiny marginal osteophytes are present off the inferior margin of the left femoral head.  IMPRESSION: Mild left hip osteoarthritis.   Original Report Authenticated By: Andreas Newport, M.D.    Ct Head (brain) Wo Contrast  02/18/2013  *RADIOLOGY REPORT*  Clinical Data: Altered mental status.  Left-sided weakness.  CT HEAD WITHOUT CONTRAST  Technique:  Contiguous axial images were obtained from the base of the skull through the vertex without contrast.  Comparison: None.  Findings: Low density area noted in the right parietal or posterior temporal lobe compatible with subacute infarction.  No hemorrhage. No mass effect midline shift.  No hydrocephalus.  No acute calvarial abnormality. Visualized paranasal sinuses and mastoids clear.  Orbital soft tissues unremarkable.  IMPRESSION: Likely subacute infarction in the posterior right temporal or parietal lobe.   Original Report Authenticated By: Charlett Nose, M.D.    Mr [facility] Wo Contrast  02/19/2013  *RADIOLOGY REPORT*  Clinical Data:  Acute onset of left lower extremity weakness. Intermittent episodes of speech difficulty.  History of prior stroke.  History of hypertension and diabetes.  MRI HEAD WITHOUT CONTRAST MRA HEAD WITHOUT CONTRAST  Technique:  Multiplanar, multiecho pulse sequences of  the brain and surrounding structures were obtained without intravenous contrast. Angiographic images of the head were obtained using MRA technique without contrast.  Comparison:  CT head 02/18/2013.  MRI HEAD  Findings:  Acute infarction affects the posterior putamen, internal capsule, and periventricular white matter, all on the right.  There is no associated hemorrhage. There is no mass lesion or hydrocephalus.  No extra-axial fluid is present.  Normal cerebral volume.  Mild to moderate chronic  microvascular ischemic change.  Remote large vessel infarct posterior temporal lobe on the right with gliosis and encephalomalacia.  Prominent perivascular spaces. Flow voids are maintained in the internal carotid arteries and basilar artery.  No visible proximal MCA occlusion.  No foci of chronic hemorrhage.  Partial empty sella. Mild pannus.  Intact osseous structures.  No acute sinus or mastoid disease.  IMPRESSION: Acute right hemisphere infarct posterior putamen, internal capsule, and periventricular white matter.  Chronic changes as described.  MRA HEAD  Findings: Mild nonstenotic irregularity cavernous left internal carotid artery.  No flow limiting stenosis.  Normal right internal carotid artery.  Normal basilar artery.  Vertebrals codominant. Normal left anterior cerebral artery.  Diseased A1 and A2 segments right anterior cerebral.  Mild nonstenotic irregularity both distal M1 segments, middle cerebral arteries, right greater than left. Moderate irregularity of the MCA branches distal to the trifurcation bilaterally, slightly worse on the right.  Mild irregularity ambient segment right PCA.  No cerebellar branch occlusion.  No intracranial aneurysm.  IMPRESSION: Moderate intracranial atherosclerotic change affects both the middle cerebral arteries both proximally as well as distal to the trifurcation,  as well as the ambient segment right PCA.  Mild nonstenotic irregularity cavernous L ICA. There is no proximal MCA occlusion.   Original Report Authenticated By: Davonna Belling, M.D.    Mr Brain Wo Contrast  02/19/2013  *RADIOLOGY REPORT*  Clinical Data:  Acute onset of left lower extremity weakness. Intermittent episodes of speech difficulty.  History of prior stroke.  History of hypertension and diabetes.  MRI HEAD WITHOUT CONTRAST MRA HEAD WITHOUT CONTRAST  Technique:  Multiplanar, multiecho pulse sequences of the brain and surrounding structures were obtained without intravenous contrast. Angiographic  images of the head were obtained using MRA technique without contrast.  Comparison:  CT head 02/18/2013.  MRI HEAD  Findings:  Acute infarction affects the posterior putamen, internal capsule, and periventricular white matter, all on the right.  There is no associated hemorrhage. There is no mass lesion or hydrocephalus.  No extra-axial fluid is present.  Normal cerebral volume.  Mild to moderate chronic microvascular ischemic change.  Remote large vessel infarct posterior temporal lobe on the right with gliosis and encephalomalacia.  Prominent perivascular spaces. Flow voids are maintained in the internal carotid arteries and basilar artery.  No visible proximal MCA occlusion.  No foci of chronic hemorrhage.  Partial empty sella. Mild pannus.  Intact osseous structures.  No acute sinus or mastoid disease.  IMPRESSION: Acute right hemisphere infarct posterior putamen, internal capsule, and periventricular white matter.  Chronic changes as described.  MRA HEAD  Findings: Mild nonstenotic irregularity cavernous left internal carotid artery.  No flow limiting stenosis.  Normal right internal carotid artery.  Normal basilar artery.  Vertebrals codominant. Normal left anterior cerebral artery.  Diseased A1 and A2 segments right anterior cerebral.  Mild nonstenotic irregularity both distal M1 segments, middle cerebral arteries, right greater than left. Moderate irregularity of the MCA branches distal to the trifurcation bilaterally, slightly worse on the right.  Mild  irregularity ambient segment right PCA.  No cerebellar branch occlusion.  No intracranial aneurysm.  IMPRESSION: Moderate intracranial atherosclerotic change affects both the middle cerebral arteries both proximally as well as distal to the trifurcation,  as well as the ambient segment right PCA.  Mild nonstenotic irregularity cavernous L ICA. There is no proximal MCA occlusion.   Original Report Authenticated By: Davonna Belling, M.D.       Medications: I  have reviewed the patient's current medications.  Impression: 1. Acute right hemispheric CVA. 2. Hypertension, uncontrolled. 3. Type 2 diabetes mellitus. 4. Hyperlipidemia. 5. Obesity.     Plan: 1. Modify antihypertensive medications. I discontinued verapamil and nifedipine and have replaced it with amlodipine 5 mg daily, which will start tomorrow. In the meantime use intravenous Labetalol when necessary. 2. Increase Lipitor to 80 mg daily. 3. Neurology consultation. Await complete workup for CVA.     LOS: 1 day   Wilson Singer Pager [phone]  02/19/2013, 11:10 AM

## 2013-02-19 NOTE — H&P (Signed)
[facility] History and Physical  Kara Kirby ZOX:[id number] DOB: 1946-01-21 DOA: 02/18/2013  Referring physician: Jordan Likes PCP: Lilyan Punt, MD  Specialists: none  Chief Complaint: Left leg weakness, pain with left leg weight bearing  HPI: Kara Kirby is a 67 y.o. female with a past medical history significant for hypertension, diabetes, hyperlipidemia and prior stroke in 2004 who presents to the emergency department complaining of acute onset left lower extremity weakness and inability to bear weight on her left leg secondary to pain. She has also had intermittent episodes of difficulty with speech but she has been dealing with an upper respiratory, sinusitis infection undergoing antibiotic treatment for the past week.  Review of Systems: The patient denies anorexia, fever, weight loss,, vision loss, decreased hearing, hoarseness, chest pain, syncope, dyspnea on exertion, peripheral edema,  hemoptysis, abdominal pain, melena, hematochezia, severe indigestion/heartburn, hematuria, incontinence, genital sores,  suspicious skin lesions, transient blindness,  depression, unusual weight change, abnormal bleeding, enlarged lymph nodes, angioedema, and breast masses.    Past Medical History  Diagnosis Date  . DM (diabetes mellitus)   . HTN (hypertension)   . Sleep related leg cramps   . Hyperlipidemia   . GERD (gastroesophageal reflux disease)     intermittent Sx  . CVA (cerebral vascular accident)   . Allergy   . Hematuria   . Renal insufficiency, mild   . Hip arthritis 2003    Left  . Dyslipidemia    Past Surgical History  Procedure Laterality Date  . S/p hysterectomy    . Colonoscopy    . Total abdominal hysterectomy      FIBROID, HYPERMENORRHEA  . Carotid endarterectomy  RIGHT  . Colonoscopy  10/19/2011    Procedure: COLONOSCOPY;  Surgeon: Arlyce Harman, MD;  Location: [facility];  Service: Endoscopy;  Laterality: N/A;  10:15   Social History:   reports that she has never smoked. She has never used smokeless tobacco. She reports that she does not drink alcohol or use illicit drugs. Lives at home independently.  Allergies  Allergen Reactions  . Other Hives    IV DYE     Family History  Problem Relation Age of Onset  . Colon cancer Neg Hx   . Colon polyps Neg Hx   . Hypertension Mother   . Heart attack Mother   . Hypertension Sister   . Diabetes Sister   . Diabetes Sister   . Hypertension Sister    Prior to Admission medications   Medication Sig Start Date End Date Taking? Authorizing Provider  aspirin EC 81 MG tablet Take 81 mg by mouth daily.   Yes Historical Provider, MD  atorvastatin (LIPITOR) 40 MG tablet Take 1 tablet (40 mg total) by mouth daily. 02/15/13  Yes Babs Sciara, MD  cefPROZIL (CEFZIL) 500 MG tablet Take 1 tablet (500 mg total) by mouth 2 (two) times daily. 02/09/13  Yes Campbell Riches, NP  doxazosin (CARDURA) 4 MG tablet Take 2 tablets (8 mg total) by mouth at bedtime. 01/31/13  Yes Babs Sciara, MD  glipiZIDE (GLUCOTROL) 5 MG tablet Take 1 tablet by mouth 2 (two) times daily. 12/11/12  Yes Historical Provider, MD  hydrochlorothiazide (HYDRODIURIL) 25 MG tablet Take 25 mg by mouth daily.  07/23/11  Yes Historical Provider, MD  KLOR-CON M10 10 MEQ tablet Take 10 mEq by mouth 2 (two) times daily.  07/04/11  Yes Historical Provider, MD  lisinopril (PRINIVIL,ZESTRIL) 20 MG tablet Take 40 mg by mouth daily.  08/03/11  Yes Historical Provider, MD  metFORMIN (GLUCOPHAGE) 500 MG tablet Take 500 mg by mouth 2 (two) times daily with a meal.  07/04/11  Yes Historical Provider, MD  NIFEDIAC CC 60 MG 24 hr tablet Take 60 mg by mouth daily.  09/16/11  Yes Historical Provider, MD  verapamil (VERELAN PM) 240 MG 24 hr capsule Take 1 capsule by mouth daily. 12/11/12  Yes Historical Provider, MD  zolpidem (AMBIEN) 10 MG tablet Take 10 mg by mouth at bedtime as needed.  11/02/12   Historical Provider, MD   Physical Exam: Filed  Vitals:   02/18/13 2302 02/19/13 0115 02/19/13 0433 02/19/13 0437  BP: 196/88 187/108 162/84   Pulse: 72 83 72   Temp:  98 F (36.7 C) 97.7 F (36.5 C)   TempSrc:  Oral Oral   Resp: 16  18   Height:    5\' 3"  (1.6 m)  Weight:  89.9 kg (198 lb 3.1 oz)    SpO2: 97%  100%    General appearance: NAD, conversant  Eyes: anicteric sclerae, moist conjunctivae; no lid-lag; PERRLA HENT: Atraumatic; oropharynx clear with moist mucous membranes and no mucosal ulcerations; normal hard and soft palate Neck: Trachea midline; FROM, supple, no thyromegaly or lymphadenopathy Lungs: CTA, with normal respiratory effort and no intercostal retractions CV: RRR, no MRGs  Abdomen: Soft, non-tender; no masses or HSM Extremities: No peripheral edema or extremity lymphadenopathy Skin: Normal temperature, turgor and texture; no rash, ulcers or subcutaneous nodules Psych: Appropriate affect, alert and oriented to person, place and time Neuro exam: NO deficits could be determined, OOB, steady gait    Labs on Admission:  Basic Metabolic Panel:  Recent Labs Lab 02/18/13 1655  NA 138  K 3.5  CL 100  CO2 25  GLUCOSE 143*  BUN 17  CREATININE 0.90  CALCIUM 9.6   Liver Function Tests:  Recent Labs Lab 02/18/13 1655  AST 14  ALT 13  ALKPHOS 67  BILITOT 0.4  PROT 7.5  ALBUMIN 4.1   CBC:  Recent Labs Lab 02/18/13 1655  WBC 7.4  NEUTROABS 3.4  HGB 13.2  HCT 38.8  MCV 87.0  PLT 287   Cardiac Enzymes:  Recent Labs Lab 02/18/13 1655  TROPONINI <0.30    BNP (last 3 results) No results found for this basename: PROBNP,  in the last 8760 hours CBG:  Recent Labs Lab 02/18/13 1611 02/18/13 1704 02/18/13 2011  GLUCAP 145* 128* 135*    Radiological Exams on Admission: Dg Chest 2 View  02/18/2013  *RADIOLOGY REPORT*  Clinical Data: Hypertension.  Not feeling well.  CHEST - 2 VIEW  Comparison: 02/04/2010  Findings: Mild cardiomegaly.  Minimal bibasilar opacities, likely atelectasis.   Left lung is clear.  No effusions or acute bony abnormality.  IMPRESSION: Right base atelectasis.  Borderline cardiomegaly.   Original Report Authenticated By: Charlett Nose, M.D.    Dg Hip Complete Left  02/18/2013  *RADIOLOGY REPORT*  Clinical Data: Left hip pain.  No injury.  LEFT HIP - COMPLETE 2+ VIEW  Comparison: None.  Findings: Pannus projects over the hips.  There is no fracture. The hip joint spaces appear symmetric.  Hips are located.  Pubic symphysis degenerative disease.  Phleboliths in the pelvis.  There is "whiskering" of the iliac crest compatible with diffuse idiopathic skeletal hyperostosis.  Tiny marginal osteophytes are present off the inferior margin of the left femoral head.  IMPRESSION: Mild left hip osteoarthritis.   Original Report Authenticated By: Andreas Newport,  M.D.    Ct Head (brain) Wo Contrast  02/18/2013  *RADIOLOGY REPORT*  Clinical Data: Altered mental status.  Left-sided weakness.  CT HEAD WITHOUT CONTRAST  Technique:  Contiguous axial images were obtained from the base of the skull through the vertex without contrast.  Comparison: None.  Findings: Low density area noted in the right parietal or posterior temporal lobe compatible with subacute infarction.  No hemorrhage. No mass effect midline shift.  No hydrocephalus.  No acute calvarial abnormality. Visualized paranasal sinuses and mastoids clear.  Orbital soft tissues unremarkable.  IMPRESSION: Likely subacute infarction in the posterior right temporal or parietal lobe.   Original Report Authenticated By: Charlett Nose, M.D.     EKG: Independently reviewed. Sinus Tachycardia.  Assessment/Plan Principal Problem:   Stroke Active Problems:   Occlusion and stenosis of carotid artery without mention of cerebral infarction   HTN (hypertension)   Diabetes   Hyperlipidemia   Kara Kirby is a 67 year old woman who has a past history of stroke and risk factors of hypertension diabetes and hyperlipidemia who has been on  secondary prevention with aspirin, antihypertensives and Lipitor. The day of admission she developed some left lower extremity weakness as well as some difficulty with speaking therefore she decided to come in for further evaluation and management. At the time of admission her symptoms had resolved but a CT scan showed subacute stroke in the temporoparietal lobe.    Admitted under acute stroke order set, MRI MRA pending as well as carotid Dopplers, 2-D echo and additional lab work to include a TSH, troponin, and liver function test were added.  Plain films of her left hip did not reveal any acute fracture or cause for painful weightbearing. She does have moderate osteoarthritis in that hip.  The did not resume an oral antibiotic for her sinusitis/upper respiratory infection, she has no signs of systemic infection or ongoing bacterial infection.  Indeed the addition of Plavix to her stroke prevention medications, her old strokes appear to be mostly hypertensive in nature and her blood pressure is currently under excellent control with her oral home medications.  Disposition Patient may be ready for discharge today following complete stroke workup.  Time spent: 50 minutes  [facility] Pager [phone]  If 7PM-7AM, please contact night-coverage [email or web address] Password [facility] 02/19/2013, 6:16 AM

## 2013-02-19 NOTE — Progress Notes (Signed)
UR chart review completed.  

## 2013-02-19 NOTE — Evaluation (Signed)
Occupational Therapy Evaluation Patient Details Name: Kara Kirby MRN: [id number] DOB: [birth year] Today's Date: 02/19/2013 Time: 4098-1191 OT Time Calculation (min): 17 min  OT Assessment / Plan / Recommendation Clinical Impression  Patient is a 67 y/o female s/p CVA presenting to acute OT with all education complete. Patient is functioning at baseline at Independent level. Patient reports that any deficits she is experiencing she had prior to this 2nd CVA.     OT Assessment  Patient does not need any further OT services    Follow Up Recommendations  No OT follow up       Equipment Recommendations  None recommended by OT          Precautions / Restrictions Precautions Precautions: None Restrictions Weight Bearing Restrictions: No   Pertinent Vitals/Pain No complaints.     ADL  Toilet Transfer: Performed;Independent Toilet Transfer Method: Surveyor, minerals: Regular height toilet Toileting - Clothing Manipulation and Hygiene: Performed;Independent Where Assessed - Toileting Clothing Manipulation and Hygiene: Standing     Visit Information  Last OT Received On: 02/19/13 Assistance Needed: +1 PT/OT Co-Evaluation/Treatment: Yes    Subjective Data  Subjective: S: I feel like I'm back to normal. Patient Stated Goal: To go home.    Prior Functioning     Home Living Lives With: Alone Type of Home: House Home Access: Stairs to enter Entrance Stairs-Number of Steps: 1 Home Layout: One level Bathroom Shower/Tub: Engineer, manufacturing systems: Standard Home Adaptive Equipment: None Prior Function Level of Independence: Independent Able to Take Stairs?: Yes Driving: Yes Vocation: Unemployed Communication Communication: No difficulties Dominant Hand: Right         Vision/Perception Vision - History Baseline Vision: Wears glasses all the time Patient Visual Report: Other (comment) (Patient reports having vision deficits in left  eye prior.) Vision - Assessment Eye Alignment: Within Functional Limits   Cognition  Cognition Arousal/Alertness: Awake/alert Behavior During Therapy: WFL for tasks assessed/performed Overall Cognitive Status: Within Functional Limits for tasks assessed    Extremity/Trunk Assessment Right Upper Extremity Assessment RUE ROM/Strength/Tone: Within functional levels Left Upper Extremity Assessment LUE ROM/Strength/Tone: Deficits LUE ROM/Strength/Tone Deficits: Shoulder flexion AROM 75% range due to frozen shoulder. MMT: 4-/5 LUE Sensation: WFL - Light Touch LUE Coordination: Deficits LUE Coordination Deficits: slowed gross and fine motor coordination Right Lower Extremity Assessment RLE ROM/Strength/Tone: Within functional levels RLE Sensation: WFL - Light Touch;WFL - Proprioception RLE Coordination: WFL - gross motor Left Lower Extremity Assessment LLE ROM/Strength/Tone: Within functional levels LLE Sensation: WFL - Light Touch;WFL - Proprioception LLE Coordination: WFL - gross motor Trunk Assessment Trunk Assessment: Normal     Mobility Bed Mobility Bed Mobility: Sit to Supine;Supine to Sit Supine to Sit: 7: Independent;HOB flat Sit to Supine: 7: Independent;HOB flat Transfers Sit to Stand: 7: Independent;From bed;Without upper extremity assist Stand to Sit: 7: Independent;To bed;Without upper extremity assist        Balance Balance Balance Assessed: Yes High Level Balance High Level Balance Activites: Side stepping;Backward walking;Direction changes;Turns;Sudden stops;Head turns High Level Balance Comments: no LOB with above   End of Session OT - End of Session Equipment Utilized During Treatment: Gait belt Activity Tolerance: Patient tolerated treatment well Patient left: in bed;with call bell/phone within reach   [facility], OTR/L,CBIS   02/19/2013, 2:29 PM

## 2013-02-19 NOTE — Care Management Note (Signed)
    Page 1 of 1   02/20/2013     3:57:40 PM   CARE MANAGEMENT NOTE 02/20/2013  Patient:  Kara Kirby, Kara Kirby   Account Number:  [id number]  Date Initiated:  02/19/2013  Documentation initiated by:  Sharrie Rothman  Subjective/Objective Assessment:   Pt admitted from home with CVA. Pt currently lives alone and will return home at discharge. Pt is independent with ADL's.     Action/Plan:   PT eval'd pt and no PT followup needed. Pt refuses any HH at this time.   Anticipated DC Date:  02/21/2013   Anticipated DC Plan:  HOME/SELF CARE      DC Planning Services  CM consult      Choice offered to / List presented to:             Status of service:  Completed, signed off Medicare Important Message given?  YES (If response is "NO", the following Medicare IM given date fields will be blank) Date Medicare IM given:  02/20/2013 Date Additional Medicare IM given:    Discharge Disposition:  HOME/SELF CARE  Per UR Regulation:    If discussed at Long Length of Stay Meetings, dates discussed:    Comments:  02/20/13 1600 Arlyss Queen, RN BSN CM Pt discharged home today. No CM needs noted.  02/19/13 1340 Arlyss Queen, RN BSN CM

## 2013-02-19 NOTE — Progress Notes (Signed)
*  PRELIMINARY RESULTS* Echocardiogram 2D Echocardiogram has been performed.  Kara Kirby 02/19/2013, 4:04 PM

## 2013-02-20 ENCOUNTER — Inpatient Hospital Stay (HOSPITAL_COMMUNITY): Payer: Medicare Other

## 2013-02-20 DIAGNOSIS — E119 Type 2 diabetes mellitus without complications: Secondary | ICD-10-CM

## 2013-02-20 DIAGNOSIS — I10 Essential (primary) hypertension: Secondary | ICD-10-CM

## 2013-02-20 LAB — GLUCOSE, CAPILLARY
Glucose-Capillary: 169 mg/dL — ABNORMAL HIGH (ref 70–99)
Glucose-Capillary: 153 mg/dL — ABNORMAL HIGH (ref 70–99)

## 2013-02-20 MED ORDER — LORATADINE 10 MG PO TABS
10.0000 mg | ORAL_TABLET | Freq: Every day | ORAL | Status: DC
  Administered 2013-02-20: 10 mg via ORAL
  Filled 2013-02-20: qty 1

## 2013-02-20 MED ORDER — CLOPIDOGREL BISULFATE 75 MG PO TABS: 75.0000 mg | ORAL_TABLET | Freq: Every day | ORAL | Status: DC

## 2013-02-20 MED ORDER — ASPIRIN 325 MG PO TABS: 325.0000 mg | ORAL_TABLET | Freq: Every day | ORAL | Status: DC

## 2013-02-20 MED ORDER — CLOPIDOGREL BISULFATE 75 MG PO TABS
75.0000 mg | ORAL_TABLET | Freq: Every day | ORAL | Status: DC
  Administered 2013-02-20: 75 mg via ORAL
  Filled 2013-02-20: qty 1

## 2013-02-20 MED ORDER — ATORVASTATIN CALCIUM 80 MG PO TABS: 80.0000 mg | ORAL_TABLET | Freq: Every day | ORAL | Status: DC

## 2013-02-20 NOTE — Discharge Summary (Addendum)
Physician Discharge Summary  Kara Kirby QMV:[id number] DOB: 1946/10/03 DOA: 02/18/2013  PCP: Lilyan Punt, MD  Admit date: 02/18/2013 Discharge date: 02/20/2013  Time spent: 40 minutes  Recommendations for Outpatient Follow-up:  1. Follow up with PCP 1 week for evaluation of symptoms, medication review, BP control  Discharge Diagnoses:  1. Acute right hemispheric CVA. 2. Occlusion of the right internal carotid artery. No surgery indicated. 3. Hypertension, uncontrolled. 4. Type 2 diabetes mellitus. 5. Hyperlipidemia. 6. Obesity.   Discharge Condition: stable  Diet recommendation: carb modified  Filed Weights   02/18/13 1604 02/19/13 0115  Weight: 89.812 kg (198 lb) 89.9 kg (198 lb 3.1 oz)    History of present illness:  Kara Kirby is a 67 y.o. female with a past medical history significant for hypertension, diabetes, hyperlipidemia and prior stroke in 2004 who presented to the emergency department on 02/19/13 complaining of acute onset left lower extremity weakness and inability to bear weight on her left leg secondary to pain. She has also had intermittent episodes of difficulty with speech but she has been dealing with an upper respiratory, sinusitis infection undergoing antibiotic treatment for the past week.   Hospital Course:  1. Acute right hemispheric CVA per MRI. Pt seen by neurology. Recommended a combination of plavix and asa for 3 months then just plavix. LDL 115. Lipitor dose increased to 80mg . HgA1c 7.1, TSH 2.6. No Pt/OT follow up needed.  2. Hypertension, uncontrolled on admission. Verapamil and nifedipine discontinued and replaced with amlodipine 5mg .   Improved control with amlodipine, ACE inhibitor, HCTZ.   3. Type 2 diabetes mellitus. Uncontrolled.  HgA1c 7.1. CBG range 155-169. SSI while in hospital. Resume glipizide and metformin at discharge. Recommend close follow up PCP.   4. Hyperlipidemia. LDL 115 otherwise unremarkable. Lipitor dose  increased.  5. Obesity: BMI 34.1. Nutritional consult   Procedures: Echocardiogram: Study Conclusions  Left ventricle: The cavity size was normal. There was severe concentric hypertrophy. Systolic function was normal. The estimated ejection fraction was in the range of 55% to 60%. Wall motion was normal; there were no regional wall motion abnormalities. Doppler parameters are consistent with abnormal left ventricular relaxation (grade 1 diastolic dysfunction). Transthoracic echocardiography. M-mode, complete 2D, spectral Doppler, and color Doppler. Height: Height: 160cm. Height: 63in. Weight: Weight: 89.8kg. Weight: 197.6lb. Body mass index: BMI: 35.1kg/m^2. Body surface area: BSA: 1.14m^2. Patient status: Inpatient. Location: Bedside.   Consultations:  neurology  Discharge Exam: Filed Vitals:   02/19/13 0708 02/19/13 1453 02/19/13 2159 02/20/13 0648  BP: 181/95 169/93 147/82 159/89  Pulse: 81 77 80 73  Temp: 97.8 F (36.6 C) 97.8 F (36.6 C) 97.8 F (36.6 C) 97.7 F (36.5 C)  TempSrc: Oral  Oral Oral  Resp: 18 16 20 20   Height:      Weight:      SpO2: 100% 95% 96% 96%    General: awake alert orineted x3 Cardiovascular: RRR No MGR No LE edema Respiratory: normal effort BS clear bilaterally no wheeze no rhonchi.   Discharge Instructions   Future Appointments Provider Department Dept Phone   01/16/2014 10:00 AM [facility] [phone]   01/16/2014 11:00 AM Chuck Hint, MD [facility] [phone]       Medication List    STOP taking these medications       aspirin EC 81 MG tablet     cefPROZIL 500 MG tablet  Commonly known as:  CEFZIL  NIFEDIAC CC 60 MG 24 hr tablet  Generic drug:  NIFEdipine     verapamil 240 MG 24 hr capsule  Commonly known as:  VERELAN PM      TAKE these medications       aspirin 325 MG tablet  Take 1 tablet (325 mg total) by mouth daily.      atorvastatin 80 MG tablet  Commonly known as:  LIPITOR  Take 1 tablet (80 mg total) by mouth daily.     clopidogrel 75 MG tablet  Commonly known as:  PLAVIX  Take 1 tablet (75 mg total) by mouth daily with breakfast.     doxazosin 4 MG tablet  Commonly known as:  CARDURA  Take 2 tablets (8 mg total) by mouth at bedtime.     glipiZIDE 5 MG tablet  Commonly known as:  GLUCOTROL  Take 1 tablet by mouth 2 (two) times daily.     hydrochlorothiazide 25 MG tablet  Commonly known as:  HYDRODIURIL  Take 25 mg by mouth daily.     KLOR-CON M10 10 MEQ tablet  Generic drug:  potassium chloride  Take 10 mEq by mouth 2 (two) times daily.     lisinopril 20 MG tablet  Commonly known as:  PRINIVIL,ZESTRIL  Take 40 mg by mouth daily.     metFORMIN 500 MG tablet  Commonly known as:  GLUCOPHAGE  Take 500 mg by mouth 2 (two) times daily with a meal.     zolpidem 10 MG tablet  Commonly known as:  AMBIEN  Take 10 mg by mouth at bedtime as needed.       Allergies  Allergen Reactions  . Other Hives    IV DYE       The results of significant diagnostics from this hospitalization (including imaging, microbiology, ancillary and laboratory) are listed below for reference.    Significant Diagnostic Studies: Dg Chest 2 View  02/18/2013  *RADIOLOGY REPORT*  Clinical Data: Hypertension.  Not feeling well.  CHEST - 2 VIEW  Comparison: 02/04/2010  Findings: Mild cardiomegaly.  Minimal bibasilar opacities, likely atelectasis.  Left lung is clear.  No effusions or acute bony abnormality.  IMPRESSION: Right base atelectasis.  Borderline cardiomegaly.   Original Report Authenticated By: Charlett Nose, M.D.    Dg Hip Complete Left  02/18/2013  *RADIOLOGY REPORT*  Clinical Data: Left hip pain.  No injury.  LEFT HIP - COMPLETE 2+ VIEW  Comparison: None.  Findings: Pannus projects over the hips.  There is no fracture. The hip joint spaces appear symmetric.  Hips are located.  Pubic symphysis degenerative  disease.  Phleboliths in the pelvis.  There is "whiskering" of the iliac crest compatible with diffuse idiopathic skeletal hyperostosis.  Tiny marginal osteophytes are present off the inferior margin of the left femoral head.  IMPRESSION: Mild left hip osteoarthritis.   Original Report Authenticated By: Andreas Newport, M.D.    Ct Head (brain) Wo Contrast  02/18/2013  *RADIOLOGY REPORT*  Clinical Data: Altered mental status.  Left-sided weakness.  CT HEAD WITHOUT CONTRAST  Technique:  Contiguous axial images were obtained from the base of the skull through the vertex without contrast.  Comparison: None.  Findings: Low density area noted in the right parietal or posterior temporal lobe compatible with subacute infarction.  No hemorrhage. No mass effect midline shift.  No hydrocephalus.  No acute calvarial abnormality. Visualized paranasal sinuses and mastoids clear.  Orbital soft tissues unremarkable.  IMPRESSION: Likely subacute infarction in the posterior right  temporal or parietal lobe.   Original Report Authenticated By: Charlett Nose, M.D.    Mr [facility] Wo Contrast  02/19/2013  *RADIOLOGY REPORT*  Clinical Data:  Acute onset of left lower extremity weakness. Intermittent episodes of speech difficulty.  History of prior stroke.  History of hypertension and diabetes.  MRI HEAD WITHOUT CONTRAST MRA HEAD WITHOUT CONTRAST  Technique:  Multiplanar, multiecho pulse sequences of the brain and surrounding structures were obtained without intravenous contrast. Angiographic images of the head were obtained using MRA technique without contrast.  Comparison:  CT head 02/18/2013.  MRI HEAD  Findings:  Acute infarction affects the posterior putamen, internal capsule, and periventricular white matter, all on the right.  There is no associated hemorrhage. There is no mass lesion or hydrocephalus.  No extra-axial fluid is present.  Normal cerebral volume.  Mild to moderate chronic microvascular ischemic change.  Remote large  vessel infarct posterior temporal lobe on the right with gliosis and encephalomalacia.  Prominent perivascular spaces. Flow voids are maintained in the internal carotid arteries and basilar artery.  No visible proximal MCA occlusion.  No foci of chronic hemorrhage.  Partial empty sella. Mild pannus.  Intact osseous structures.  No acute sinus or mastoid disease.  IMPRESSION: Acute right hemisphere infarct posterior putamen, internal capsule, and periventricular white matter.  Chronic changes as described.  MRA HEAD  Findings: Mild nonstenotic irregularity cavernous left internal carotid artery.  No flow limiting stenosis.  Normal right internal carotid artery.  Normal basilar artery.  Vertebrals codominant. Normal left anterior cerebral artery.  Diseased A1 and A2 segments right anterior cerebral.  Mild nonstenotic irregularity both distal M1 segments, middle cerebral arteries, right greater than left. Moderate irregularity of the MCA branches distal to the trifurcation bilaterally, slightly worse on the right.  Mild irregularity ambient segment right PCA.  No cerebellar branch occlusion.  No intracranial aneurysm.  IMPRESSION: Moderate intracranial atherosclerotic change affects both the middle cerebral arteries both proximally as well as distal to the trifurcation,  as well as the ambient segment right PCA.  Mild nonstenotic irregularity cavernous L ICA. There is no proximal MCA occlusion.   Original Report Authenticated By: Davonna Belling, M.D.    Mr Brain Wo Contrast  02/19/2013  *RADIOLOGY REPORT*  Clinical Data:  Acute onset of left lower extremity weakness. Intermittent episodes of speech difficulty.  History of prior stroke.  History of hypertension and diabetes.  MRI HEAD WITHOUT CONTRAST MRA HEAD WITHOUT CONTRAST  Technique:  Multiplanar, multiecho pulse sequences of the brain and surrounding structures were obtained without intravenous contrast. Angiographic images of the head were obtained using MRA  technique without contrast.  Comparison:  CT head 02/18/2013.  MRI HEAD  Findings:  Acute infarction affects the posterior putamen, internal capsule, and periventricular white matter, all on the right.  There is no associated hemorrhage. There is no mass lesion or hydrocephalus.  No extra-axial fluid is present.  Normal cerebral volume.  Mild to moderate chronic microvascular ischemic change.  Remote large vessel infarct posterior temporal lobe on the right with gliosis and encephalomalacia.  Prominent perivascular spaces. Flow voids are maintained in the internal carotid arteries and basilar artery.  No visible proximal MCA occlusion.  No foci of chronic hemorrhage.  Partial empty sella. Mild pannus.  Intact osseous structures.  No acute sinus or mastoid disease.  IMPRESSION: Acute right hemisphere infarct posterior putamen, internal capsule, and periventricular white matter.  Chronic changes as described.  MRA HEAD  Findings: Mild nonstenotic irregularity  cavernous left internal carotid artery.  No flow limiting stenosis.  Normal right internal carotid artery.  Normal basilar artery.  Vertebrals codominant. Normal left anterior cerebral artery.  Diseased A1 and A2 segments right anterior cerebral.  Mild nonstenotic irregularity both distal M1 segments, middle cerebral arteries, right greater than left. Moderate irregularity of the MCA branches distal to the trifurcation bilaterally, slightly worse on the right.  Mild irregularity ambient segment right PCA.  No cerebellar branch occlusion.  No intracranial aneurysm.  IMPRESSION: Moderate intracranial atherosclerotic change affects both the middle cerebral arteries both proximally as well as distal to the trifurcation,  as well as the ambient segment right PCA.  Mild nonstenotic irregularity cavernous L ICA. There is no proximal MCA occlusion.   Original Report Authenticated By: Davonna Belling, M.D.         Labs: Basic Metabolic Panel:  Recent Labs Lab  02/18/13 1655  NA 138  K 3.5  CL 100  CO2 25  GLUCOSE 143*  BUN 17  CREATININE 0.90  CALCIUM 9.6   Liver Function Tests:  Recent Labs Lab 02/18/13 1655  AST 14  ALT 13  ALKPHOS 67  BILITOT 0.4  PROT 7.5  ALBUMIN 4.1     CBC:  Recent Labs Lab 02/18/13 1655  WBC 7.4  NEUTROABS 3.4  HGB 13.2  HCT 38.8  MCV 87.0  PLT 287   Cardiac Enzymes:  Recent Labs Lab 02/18/13 1655  TROPONINI <0.30   BNP: BNP (last 3 results)  CBG:  Recent Labs Lab 02/19/13 0748 02/19/13 1208 02/19/13 1649 02/19/13 2140 02/20/13 0743  GLUCAP 172* 132* 152* 155* 169*       Signed:  BLACK,KAREN M  [facility] 02/20/2013, 12:05 PM Attending: Patient seen and examined, above note  reviewed. She will need to be on 3-4 months of aspirin and Plavix and then Plavix alone after that. I've spoken with Dr. Edilia Bo, vascular surgeon, who agrees that she will not be a candidate for any surgery for the occlusion of the right internal carotid artery. Stable for discharge.

## 2013-02-20 NOTE — Consult Note (Signed)
[facility] Taffany Heiser A. Gerilyn Pilgrim, MD     [email or web address]          Kara Kirby is an 67 y.o. female.   ASSESSMENT/PLAN: 1. Acute right subcortical infarct. Risk factors hypertension, diabetes, previous stroke and dyslipidemia. I suggest that the patient be placed on accommodation and ASPIRIN and Plavix for 3 months. Subsequent, she should be placed on Plavix monotherapy. She stated today with her other risk factor reduction including statin medication, control of diabetes and control of hypertension. 2. There may be a possibility that patient has untreated obstructive sleep apnea syndrome. During outpatient sleep study is suggested.  The patient is a 67 year old black female who developed the acute onset of left leg weakness. The patient reports that she just did not feel right and the left leg although she was up walking around quite a bit. This was on Mother's Day. Weakness involving the left leg has improved and she is probably back at baseline. She reports that she may have had some slight slurring of her speech. Otherwise, review of systems are negative. She denies any headaches, dysphagia, headache, chest pain, GI or GU symptoms. She does endorse is that she snores but just a little. The patient has had a previous infarct which also involve the left side. She improved and some to have had no residual problems from that infarct. It appears that she had a large vessel infarct due to symptomatic right ICA stenosis. The patient underwent endarterectomy and sees her last surgery yearly for carotid follow-up. She tells me that she has a stenosis on the left approximately 25%. She recently had a carotid duplex Doppler just about a month ago. She tells me that it looked okay.  GENERAL:  This is a very pleasant obese lady who is in no acute distress.  HEENT: Retro-palatal space is reduced. She does have a large tongue. Neck size is also large.  ABDOMEN: soft  EXTREMITIES: No  edema   BACK: Unremarkable.  SKIN: Normal by inspection.    MENTAL STATUS: Alert and oriented. Speech, language and cognition are generally intact. Judgment and insight normal.   CRANIAL NERVES: Pupils are equal, round and reactive to light and accommodation; extra ocular movements are full, there is no significant nystagmus; visual fields are full; upper and lower facial muscles are normal in strength and symmetric, there is no flattening of the nasolabial folds; tongue is midline; uvula is midline; shoulder elevation is normal.  MOTOR: She has normal tone, bulk and strength of the upper extremities. There is no pronator drift. There is mild weakness of hip flexion on the left graded as 4+/5. The right is normal.  COORDINATION: Left finger to nose is normal, right finger to nose is normal, No rest tremor; no intention tremor; no postural tremor; no bradykinesia.  REFLEXES: Deep tendon reflexes are symmetrical but diminished in the upper and lower extremities. Plantar responses are flexor bilaterally.   SENSATION: Normal to light touch, temperature, and pinprick.   Past Medical History  Diagnosis Date  . DM (diabetes mellitus)   . HTN (hypertension)   . Sleep related leg cramps   . Hyperlipidemia   . GERD (gastroesophageal reflux disease)     intermittent Sx  . CVA (cerebral vascular accident)   . Allergy   . Hematuria   . Renal insufficiency, mild   . Hip arthritis 2003    Left  . Dyslipidemia     Past Surgical History  Procedure Laterality Date  . S/p hysterectomy    .  Colonoscopy    . Total abdominal hysterectomy      FIBROID, HYPERMENORRHEA  . Carotid endarterectomy  RIGHT  . Colonoscopy  10/19/2011    Procedure: COLONOSCOPY;  Surgeon: Arlyce Harman, MD;  Location: [facility];  Service: Endoscopy;  Laterality: N/A;  10:15  7  Family History  Problem Relation Age of Onset  . Colon cancer Neg Hx   . Colon polyps Neg Hx   . Hypertension Mother   . Heart attack  Mother   . Hypertension Sister   . Diabetes Sister   . Diabetes Sister   . Hypertension Sister     Social History:  reports that she has never smoked. She has never used smokeless tobacco. She reports that she does not drink alcohol or use illicit drugs.  Allergies:  Allergies  Allergen Reactions  . Other Hives    IV DYE     Medications:  Prior to Admission medications   Medication Sig Start Date End Date Taking? Authorizing Provider  aspirin EC 81 MG tablet Take 81 mg by mouth daily.   Yes Historical Provider, MD  atorvastatin (LIPITOR) 40 MG tablet Take 1 tablet (40 mg total) by mouth daily. 02/15/13  Yes Babs Sciara, MD  cefPROZIL (CEFZIL) 500 MG tablet Take 1 tablet (500 mg total) by mouth 2 (two) times daily. 02/09/13  Yes Campbell Riches, NP  doxazosin (CARDURA) 4 MG tablet Take 2 tablets (8 mg total) by mouth at bedtime. 01/31/13  Yes Babs Sciara, MD  glipiZIDE (GLUCOTROL) 5 MG tablet Take 1 tablet by mouth 2 (two) times daily. 12/11/12  Yes Historical Provider, MD  hydrochlorothiazide (HYDRODIURIL) 25 MG tablet Take 25 mg by mouth daily.  07/23/11  Yes Historical Provider, MD  KLOR-CON M10 10 MEQ tablet Take 10 mEq by mouth 2 (two) times daily.  07/04/11  Yes Historical Provider, MD  lisinopril (PRINIVIL,ZESTRIL) 20 MG tablet Take 40 mg by mouth daily.  08/03/11  Yes Historical Provider, MD  metFORMIN (GLUCOPHAGE) 500 MG tablet Take 500 mg by mouth 2 (two) times daily with a meal.  07/04/11  Yes Historical Provider, MD  NIFEDIAC CC 60 MG 24 hr tablet Take 60 mg by mouth daily.  09/16/11  Yes Historical Provider, MD  verapamil (VERELAN PM) 240 MG 24 hr capsule Take 1 capsule by mouth daily. 12/11/12  Yes Historical Provider, MD  zolpidem (AMBIEN) 10 MG tablet Take 10 mg by mouth at bedtime as needed.  11/02/12   Historical Provider, MD    Scheduled Meds: . amLODipine  5 mg Oral Daily  . aspirin  300 mg Rectal Daily   Or  . aspirin  325 mg Oral Daily  . atorvastatin  80 mg  Oral Daily  . doxazosin  8 mg Oral QHS  . enoxaparin (LOVENOX) injection  40 mg Subcutaneous Q24H  . hydrochlorothiazide  25 mg Oral Daily  . insulin aspart  0-15 Units Subcutaneous TID WC  . insulin aspart  4 Units Subcutaneous TID WC  . lisinopril  40 mg Oral Daily  . potassium chloride  10 mEq Oral BID   Continuous Infusions: . sodium chloride 100 mL/hr at 02/20/13 0805  . sodium chloride     PRN Meds:.acetaminophen, acetaminophen, ondansetron (ZOFRAN) IV, senna-docusate, zolpidem   Blood pressure 159/89, pulse 73, temperature 97.7 F (36.5 C), temperature source Oral, resp. rate 20, height 5\' 3"  (1.6 m), weight 89.9 kg (198 lb 3.1 oz), SpO2 96.00%.   Results for orders  placed during the hospital encounter of 02/18/13 (from the past 48 hour(s))  GLUCOSE, CAPILLARY     Status: Abnormal   Collection Time    02/18/13  4:11 PM      Result Value Range   Glucose-Capillary 145 (*) 70 - 99 mg/dL  URINALYSIS, ROUTINE W REFLEX MICROSCOPIC     Status: Abnormal   Collection Time    02/18/13  4:50 PM      Result Value Range   Color, Urine YELLOW  YELLOW   APPearance CLEAR  CLEAR   Specific Gravity, Urine 1.015  1.005 - 1.030   pH 6.0  5.0 - 8.0   Glucose, UA NEGATIVE  NEGATIVE mg/dL   Hgb urine dipstick SMALL (*) NEGATIVE   Bilirubin Urine NEGATIVE  NEGATIVE   Ketones, ur NEGATIVE  NEGATIVE mg/dL   Protein, ur NEGATIVE  NEGATIVE mg/dL   Urobilinogen, UA 0.2  0.0 - 1.0 mg/dL   Nitrite NEGATIVE  NEGATIVE   Leukocytes, UA NEGATIVE  NEGATIVE  URINE MICROSCOPIC-ADD ON     Status: None   Collection Time    02/18/13  4:50 PM      Result Value Range   RBC / HPF 0-2  <3 RBC/hpf  CBC WITH DIFFERENTIAL     Status: None   Collection Time    02/18/13  4:55 PM      Result Value Range   WBC 7.4  4.0 - 10.5 K/uL   RBC 4.46  3.87 - 5.11 MIL/uL   Hemoglobin 13.2  12.0 - 15.0 g/dL   HCT 16.1  09.6 - 04.5 %   MCV 87.0  78.0 - 100.0 fL   MCH 29.6  26.0 - 34.0 pg   MCHC 34.0  30.0 - 36.0  g/dL   RDW 40.9  81.1 - 91.4 %   Platelets 287  150 - 400 K/uL   Neutrophils Relative 46  43 - 77 %   Neutro Abs 3.4  1.7 - 7.7 K/uL   Lymphocytes Relative 43  12 - 46 %   Lymphs Abs 3.2  0.7 - 4.0 K/uL   Monocytes Relative 9  3 - 12 %   Monocytes Absolute 0.7  0.1 - 1.0 K/uL   Eosinophils Relative 2  0 - 5 %   Eosinophils Absolute 0.1  0.0 - 0.7 K/uL   Basophils Relative 0  0 - 1 %   Basophils Absolute 0.0  0.0 - 0.1 K/uL  COMPREHENSIVE METABOLIC PANEL     Status: Abnormal   Collection Time    02/18/13  4:55 PM      Result Value Range   Sodium 138  135 - 145 mEq/L   Potassium 3.5  3.5 - 5.1 mEq/L   Chloride 100  96 - 112 mEq/L   CO2 25  19 - 32 mEq/L   Glucose, Bld 143 (*) 70 - 99 mg/dL   BUN 17  6 - 23 mg/dL   Creatinine, Ser 7.82  0.50 - 1.10 mg/dL   Calcium 9.6  8.4 - 95.6 mg/dL   Total Protein 7.5  6.0 - 8.3 g/dL   Albumin 4.1  3.5 - 5.2 g/dL   AST 14  0 - 37 U/L   ALT 13  0 - 35 U/L   Alkaline Phosphatase 67  39 - 117 U/L   Total Bilirubin 0.4  0.3 - 1.2 mg/dL   GFR calc non Af Amer 65 (*) >90 mL/min   GFR calc Af Amer 75 (*) >  90 mL/min   Comment:            The eGFR has been calculated     using the CKD EPI equation.     This calculation has not been     validated in all clinical     situations.     eGFR's persistently     <90 mL/min signify     possible Chronic Kidney Disease.  PROTIME-INR     Status: None   Collection Time    02/18/13  4:55 PM      Result Value Range   Prothrombin Time 12.2  11.6 - 15.2 seconds   INR 0.91  0.00 - 1.49  APTT     Status: None   Collection Time    02/18/13  4:55 PM      Result Value Range   aPTT 28  24 - 37 seconds  TROPONIN I     Status: None   Collection Time    02/18/13  4:55 PM      Result Value Range   Troponin I <0.30  <0.30 ng/mL   Comment:            Due to the release kinetics of cTnI,     a negative result within the first hours     of the onset of symptoms does not rule out     myocardial infarction with  certainty.     If myocardial infarction is still suspected,     repeat the test at appropriate intervals.  TSH     Status: None   Collection Time    02/18/13  4:55 PM      Result Value Range   TSH 2.602  0.350 - 4.500 uIU/mL  HEMOGLOBIN A1C     Status: Abnormal   Collection Time    02/18/13  4:55 PM      Result Value Range   Hemoglobin A1C 7.1 (*) <5.7 %   Comment: (NOTE)                                                                               According to the ADA Clinical Practice Recommendations for 2011, when     HbA1c is used as a screening test:      >=6.5%   Diagnostic of Diabetes Mellitus               (if abnormal result is confirmed)     5.7-6.4%   Increased risk of developing Diabetes Mellitus     References:Diagnosis and Classification of Diabetes Mellitus,Diabetes     Care,2011,34(Suppl 1):S62-S69 and Standards of Medical Care in             Diabetes - 2011,Diabetes Care,2011,34 (Suppl 1):S11-S61.   Mean Plasma Glucose 157 (*) <117 mg/dL  GLUCOSE, CAPILLARY     Status: Abnormal   Collection Time    02/18/13  5:04 PM      Result Value Range   Glucose-Capillary 128 (*) 70 - 99 mg/dL  GLUCOSE, CAPILLARY     Status: Abnormal   Collection Time    02/18/13  8:11 PM      Result Value Range   Glucose-Capillary  135 (*) 70 - 99 mg/dL  HEMOGLOBIN Z6X     Status: Abnormal   Collection Time    02/19/13  5:40 AM      Result Value Range   Hemoglobin A1C 6.9 (*) <5.7 %   Comment: (NOTE)                                                                               According to the ADA Clinical Practice Recommendations for 2011, when     HbA1c is used as a screening test:      >=6.5%   Diagnostic of Diabetes Mellitus               (if abnormal result is confirmed)     5.7-6.4%   Increased risk of developing Diabetes Mellitus     References:Diagnosis and Classification of Diabetes Mellitus,Diabetes     Care,2011,34(Suppl 1):S62-S69 and Standards of Medical Care in              Diabetes - 2011,Diabetes Care,2011,34 (Suppl 1):S11-S61.   Mean Plasma Glucose 151 (*) <117 mg/dL  LIPID PANEL     Status: Abnormal   Collection Time    02/19/13  5:40 AM      Result Value Range   Cholesterol 184  0 - 200 mg/dL   Triglycerides 096  <045 mg/dL   HDL 43  >40 mg/dL   Total CHOL/HDL Ratio 4.3     VLDL 26  0 - 40 mg/dL   LDL Cholesterol 981 (*) 0 - 99 mg/dL   Comment:            Total Cholesterol/HDL:CHD Risk     Coronary Heart Disease Risk Table                         Men   Women      1/2 Average Risk   3.4   3.3      Average Risk       5.0   4.4      2 X Average Risk   9.6   7.1      3 X Average Risk  23.4   11.0                Use the calculated Patient Ratio     above and the CHD Risk Table     to determine the patient's CHD Risk.                ATP III CLASSIFICATION (LDL):      <100     mg/dL   Optimal      191-478  mg/dL   Near or Above                        Optimal      130-159  mg/dL   Borderline      295-621  mg/dL   High      >308     mg/dL   Very High  GLUCOSE, CAPILLARY     Status: Abnormal   Collection Time    02/19/13  7:48 AM  Result Value Range   Glucose-Capillary 172 (*) 70 - 99 mg/dL  GLUCOSE, CAPILLARY     Status: Abnormal   Collection Time    02/19/13 12:08 PM      Result Value Range   Glucose-Capillary 132 (*) 70 - 99 mg/dL  GLUCOSE, CAPILLARY     Status: Abnormal   Collection Time    02/19/13  4:49 PM      Result Value Range   Glucose-Capillary 152 (*) 70 - 99 mg/dL  GLUCOSE, CAPILLARY     Status: Abnormal   Collection Time    02/19/13  9:40 PM      Result Value Range   Glucose-Capillary 155 (*) 70 - 99 mg/dL   Comment 1 Documented in Chart     Comment 2 Notify RN    GLUCOSE, CAPILLARY     Status: Abnormal   Collection Time    02/20/13  7:43 AM      Result Value Range   Glucose-Capillary 169 (*) 70 - 99 mg/dL   Comment 1 Notify RN      Dg Chest 2 View  02/18/2013  *RADIOLOGY REPORT*  Clinical Data:  Hypertension.  Not feeling well.  CHEST - 2 VIEW  Comparison: 02/04/2010  Findings: Mild cardiomegaly.  Minimal bibasilar opacities, likely atelectasis.  Left lung is clear.  No effusions or acute bony abnormality.  IMPRESSION: Right base atelectasis.  Borderline cardiomegaly.   Original Report Authenticated By: Charlett Nose, M.D.    Dg Hip Complete Left  02/18/2013  *RADIOLOGY REPORT*  Clinical Data: Left hip pain.  No injury.  LEFT HIP - COMPLETE 2+ VIEW  Comparison: None.  Findings: Pannus projects over the hips.  There is no fracture. The hip joint spaces appear symmetric.  Hips are located.  Pubic symphysis degenerative disease.  Phleboliths in the pelvis.  There is "whiskering" of the iliac crest compatible with diffuse idiopathic skeletal hyperostosis.  Tiny marginal osteophytes are present off the inferior margin of the left femoral head.  IMPRESSION: Mild left hip osteoarthritis.   Original Report Authenticated By: Andreas Newport, M.D.    Ct Head (brain) Wo Contrast  02/18/2013  *RADIOLOGY REPORT*  Clinical Data: Altered mental status.  Left-sided weakness.  CT HEAD WITHOUT CONTRAST  Technique:  Contiguous axial images were obtained from the base of the skull through the vertex without contrast.  Comparison: None.  Findings: Low density area noted in the right parietal or posterior temporal lobe compatible with subacute infarction.  No hemorrhage. No mass effect midline shift.  No hydrocephalus.  No acute calvarial abnormality. Visualized paranasal sinuses and mastoids clear.  Orbital soft tissues unremarkable.  IMPRESSION: Likely subacute infarction in the posterior right temporal or parietal lobe.   Original Report Authenticated By: Charlett Nose, M.D.    Mr [facility] Wo Contrast  02/19/2013  *RADIOLOGY REPORT*  Clinical Data:  Acute onset of left lower extremity weakness. Intermittent episodes of speech difficulty.  History of prior stroke.  History of hypertension and diabetes.  MRI HEAD WITHOUT  CONTRAST MRA HEAD WITHOUT CONTRAST  Technique:  Multiplanar, multiecho pulse sequences of the brain and surrounding structures were obtained without intravenous contrast. Angiographic images of the head were obtained using MRA technique without contrast.  Comparison:  CT head 02/18/2013.  MRI HEAD  Findings:  Acute infarction affects the posterior putamen, internal capsule, and periventricular white matter, all on the right.  There is no associated hemorrhage. There is no mass lesion or hydrocephalus.  No extra-axial fluid  is present.  Normal cerebral volume.  Mild to moderate chronic microvascular ischemic change.  Remote large vessel infarct posterior temporal lobe on the right with gliosis and encephalomalacia.  Prominent perivascular spaces. Flow voids are maintained in the internal carotid arteries and basilar artery.  No visible proximal MCA occlusion.  No foci of chronic hemorrhage.  Partial empty sella. Mild pannus.  Intact osseous structures.  No acute sinus or mastoid disease.  IMPRESSION: Acute right hemisphere infarct posterior putamen, internal capsule, and periventricular white matter.  Chronic changes as described.  MRA HEAD  Findings: Mild nonstenotic irregularity cavernous left internal carotid artery.  No flow limiting stenosis.  Normal right internal carotid artery.  Normal basilar artery.  Vertebrals codominant. Normal left anterior cerebral artery.  Diseased A1 and A2 segments right anterior cerebral.  Mild nonstenotic irregularity both distal M1 segments, middle cerebral arteries, right greater than left. Moderate irregularity of the MCA branches distal to the trifurcation bilaterally, slightly worse on the right.  Mild irregularity ambient segment right PCA.  No cerebellar branch occlusion.  No intracranial aneurysm.  IMPRESSION: Moderate intracranial atherosclerotic change affects both the middle cerebral arteries both proximally as well as distal to the trifurcation,  as well as the ambient  segment right PCA.  Mild nonstenotic irregularity cavernous L ICA. There is no proximal MCA occlusion.   Original Report Authenticated By: Davonna Belling, M.D.    Mr Brain Wo Contrast  02/19/2013  *RADIOLOGY REPORT*  Clinical Data:  Acute onset of left lower extremity weakness. Intermittent episodes of speech difficulty.  History of prior stroke.  History of hypertension and diabetes.  MRI HEAD WITHOUT CONTRAST MRA HEAD WITHOUT CONTRAST  Technique:  Multiplanar, multiecho pulse sequences of the brain and surrounding structures were obtained without intravenous contrast. Angiographic images of the head were obtained using MRA technique without contrast.  Comparison:  CT head 02/18/2013.  MRI HEAD  Findings:  Acute infarction affects the posterior putamen, internal capsule, and periventricular white matter, all on the right.  There is no associated hemorrhage. There is no mass lesion or hydrocephalus.  No extra-axial fluid is present.  Normal cerebral volume.  Mild to moderate chronic microvascular ischemic change.  Remote large vessel infarct posterior temporal lobe on the right with gliosis and encephalomalacia.  Prominent perivascular spaces. Flow voids are maintained in the internal carotid arteries and basilar artery.  No visible proximal MCA occlusion.  No foci of chronic hemorrhage.  Partial empty sella. Mild pannus.  Intact osseous structures.  No acute sinus or mastoid disease.  IMPRESSION: Acute right hemisphere infarct posterior putamen, internal capsule, and periventricular white matter.  Chronic changes as described.  MRA HEAD  Findings: Mild nonstenotic irregularity cavernous left internal carotid artery.  No flow limiting stenosis.  Normal right internal carotid artery.  Normal basilar artery.  Vertebrals codominant. Normal left anterior cerebral artery.  Diseased A1 and A2 segments right anterior cerebral.  Mild nonstenotic irregularity both distal M1 segments, middle cerebral arteries, right greater  than left. Moderate irregularity of the MCA branches distal to the trifurcation bilaterally, slightly worse on the right.  Mild irregularity ambient segment right PCA.  No cerebellar branch occlusion.  No intracranial aneurysm.  IMPRESSION: Moderate intracranial atherosclerotic change affects both the middle cerebral arteries both proximally as well as distal to the trifurcation,  as well as the ambient segment right PCA.  Mild nonstenotic irregularity cavernous L ICA. There is no proximal MCA occlusion.   Original Report Authenticated By: Davonna Belling, M.D.  Kenzi Bardwell A. Gerilyn Pilgrim, M.D.  Diplomate, Biomedical engineer of Psychiatry and Neurology ( Neurology). 02/20/2013, 8:35 AM

## 2013-02-20 NOTE — Progress Notes (Signed)
Pt a/o.vss. Up ab lib. No complaints of any distress. Discharge instructions given. Prescriptions given. Pt verbalized understanding of instructions. Pt left floor via wheelchair with nursing staff and family members.

## 2013-02-20 NOTE — Progress Notes (Signed)
[facility] PROGRESS NOTE  DACY ENRICO NGE:[id number] DOB: August 15, 1946 DOA: 02/18/2013 PCP: Lilyan Punt, MD  Assessment/Plan: 1. Acute right hemispheric CVA. Await results of echo and carotid doppler. No Pt/OT follow up needed. Continue asa and statin.   2. Hypertension, Improved control with amlodipine, ACE inhibitor, HCTZ. 3. Type 2 diabetes mellitus. HgA1c 6.9. CBG range  155-169. Continue SSI.  4. Hyperlipidemia. LDL 115 otherwise unremarkable. Continue statin at higher dose.  5. Obesity   Code Status: full Family Communication:  Disposition Plan: home hopefully today   Consultants:  neuro  Procedures:  none  Antibiotics:  none  HPI/Subjective: Awake alert NAD  Objective: Filed Vitals:   02/19/13 0708 02/19/13 1453 02/19/13 2159 02/20/13 0648  BP: 181/95 169/93 147/82 159/89  Pulse: 81 77 80 73  Temp: 97.8 F (36.6 C) 97.8 F (36.6 C) 97.8 F (36.6 C) 97.7 F (36.5 C)  TempSrc: Oral  Oral Oral  Resp: 18 16 20 20   Height:      Weight:      SpO2: 100% 95% 96% 96%    Intake/Output Summary (Last 24 hours) at 02/20/13 1009 Last data filed at 02/20/13 0903  Gross per 24 hour  Intake   1486 ml  Output      4 ml  Net   1482 ml   Filed Weights   02/18/13 1604 02/19/13 0115  Weight: 89.812 kg (198 lb) 89.9 kg (198 lb 3.1 oz)    Exam:   General:  Awake alert NAD  Cardiovascular: RRR No MGR no LE edema   Respiratory: normal effort BS clear bilaterally no wheeze no rhonchi  Abdomen: soft +BS non-tender to palpation  Musculoskeletal: no clubbing no cyanosis   Data Reviewed: Basic Metabolic Panel:  Recent Labs Lab 02/18/13 1655  NA 138  K 3.5  CL 100  CO2 25  GLUCOSE 143*  BUN 17  CREATININE 0.90  CALCIUM 9.6   Liver Function Tests:  Recent Labs Lab 02/18/13 1655  AST 14  ALT 13  ALKPHOS 67  BILITOT 0.4  PROT 7.5  ALBUMIN 4.1   No results found for this basename: LIPASE, AMYLASE,  in the last 168 hours No results  found for this basename: AMMONIA,  in the last 168 hours CBC:  Recent Labs Lab 02/18/13 1655  WBC 7.4  NEUTROABS 3.4  HGB 13.2  HCT 38.8  MCV 87.0  PLT 287   Cardiac Enzymes:  Recent Labs Lab 02/18/13 1655  TROPONINI <0.30   BNP (last 3 results) No results found for this basename: PROBNP,  in the last 8760 hours CBG:  Recent Labs Lab 02/19/13 0748 02/19/13 1208 02/19/13 1649 02/19/13 2140 02/20/13 0743  GLUCAP 172* 132* 152* 155* 169*    No results found for this or any previous visit (from the past 240 hour(s)).   Studies: Dg Chest 2 View  02/18/2013  *RADIOLOGY REPORT*  Clinical Data: Hypertension.  Not feeling well.  CHEST - 2 VIEW  Comparison: 02/04/2010  Findings: Mild cardiomegaly.  Minimal bibasilar opacities, likely atelectasis.  Left lung is clear.  No effusions or acute bony abnormality.  IMPRESSION: Right base atelectasis.  Borderline cardiomegaly.   Original Report Authenticated By: Charlett Nose, M.D.    Dg Hip Complete Left  02/18/2013  *RADIOLOGY REPORT*  Clinical Data: Left hip pain.  No injury.  LEFT HIP - COMPLETE 2+ VIEW  Comparison: None.  Findings: Pannus projects over the hips.  There is no fracture. The hip joint spaces appear symmetric.  Hips are located.  Pubic symphysis degenerative disease.  Phleboliths in the pelvis.  There is "whiskering" of the iliac crest compatible with diffuse idiopathic skeletal hyperostosis.  Tiny marginal osteophytes are present off the inferior margin of the left femoral head.  IMPRESSION: Mild left hip osteoarthritis.   Original Report Authenticated By: Andreas Newport, M.D.    Ct Head (brain) Wo Contrast  02/18/2013  *RADIOLOGY REPORT*  Clinical Data: Altered mental status.  Left-sided weakness.  CT HEAD WITHOUT CONTRAST  Technique:  Contiguous axial images were obtained from the base of the skull through the vertex without contrast.  Comparison: None.  Findings: Low density area noted in the right parietal or posterior  temporal lobe compatible with subacute infarction.  No hemorrhage. No mass effect midline shift.  No hydrocephalus.  No acute calvarial abnormality. Visualized paranasal sinuses and mastoids clear.  Orbital soft tissues unremarkable.  IMPRESSION: Likely subacute infarction in the posterior right temporal or parietal lobe.   Original Report Authenticated By: Charlett Nose, M.D.    Mr [facility] Wo Contrast  02/19/2013  *RADIOLOGY REPORT*  Clinical Data:  Acute onset of left lower extremity weakness. Intermittent episodes of speech difficulty.  History of prior stroke.  History of hypertension and diabetes.  MRI HEAD WITHOUT CONTRAST MRA HEAD WITHOUT CONTRAST  Technique:  Multiplanar, multiecho pulse sequences of the brain and surrounding structures were obtained without intravenous contrast. Angiographic images of the head were obtained using MRA technique without contrast.  Comparison:  CT head 02/18/2013.  MRI HEAD  Findings:  Acute infarction affects the posterior putamen, internal capsule, and periventricular white matter, all on the right.  There is no associated hemorrhage. There is no mass lesion or hydrocephalus.  No extra-axial fluid is present.  Normal cerebral volume.  Mild to moderate chronic microvascular ischemic change.  Remote large vessel infarct posterior temporal lobe on the right with gliosis and encephalomalacia.  Prominent perivascular spaces. Flow voids are maintained in the internal carotid arteries and basilar artery.  No visible proximal MCA occlusion.  No foci of chronic hemorrhage.  Partial empty sella. Mild pannus.  Intact osseous structures.  No acute sinus or mastoid disease.  IMPRESSION: Acute right hemisphere infarct posterior putamen, internal capsule, and periventricular white matter.  Chronic changes as described.  MRA HEAD  Findings: Mild nonstenotic irregularity cavernous left internal carotid artery.  No flow limiting stenosis.  Normal right internal carotid artery.  Normal  basilar artery.  Vertebrals codominant. Normal left anterior cerebral artery.  Diseased A1 and A2 segments right anterior cerebral.  Mild nonstenotic irregularity both distal M1 segments, middle cerebral arteries, right greater than left. Moderate irregularity of the MCA branches distal to the trifurcation bilaterally, slightly worse on the right.  Mild irregularity ambient segment right PCA.  No cerebellar branch occlusion.  No intracranial aneurysm.  IMPRESSION: Moderate intracranial atherosclerotic change affects both the middle cerebral arteries both proximally as well as distal to the trifurcation,  as well as the ambient segment right PCA.  Mild nonstenotic irregularity cavernous L ICA. There is no proximal MCA occlusion.   Original Report Authenticated By: Davonna Belling, M.D.    Mr Brain Wo Contrast  02/19/2013  *RADIOLOGY REPORT*  Clinical Data:  Acute onset of left lower extremity weakness. Intermittent episodes of speech difficulty.  History of prior stroke.  History of hypertension and diabetes.  MRI HEAD WITHOUT CONTRAST MRA HEAD WITHOUT CONTRAST  Technique:  Multiplanar, multiecho pulse sequences of the brain and surrounding structures were obtained without  intravenous contrast. Angiographic images of the head were obtained using MRA technique without contrast.  Comparison:  CT head 02/18/2013.  MRI HEAD  Findings:  Acute infarction affects the posterior putamen, internal capsule, and periventricular white matter, all on the right.  There is no associated hemorrhage. There is no mass lesion or hydrocephalus.  No extra-axial fluid is present.  Normal cerebral volume.  Mild to moderate chronic microvascular ischemic change.  Remote large vessel infarct posterior temporal lobe on the right with gliosis and encephalomalacia.  Prominent perivascular spaces. Flow voids are maintained in the internal carotid arteries and basilar artery.  No visible proximal MCA occlusion.  No foci of chronic hemorrhage.   Partial empty sella. Mild pannus.  Intact osseous structures.  No acute sinus or mastoid disease.  IMPRESSION: Acute right hemisphere infarct posterior putamen, internal capsule, and periventricular white matter.  Chronic changes as described.  MRA HEAD  Findings: Mild nonstenotic irregularity cavernous left internal carotid artery.  No flow limiting stenosis.  Normal right internal carotid artery.  Normal basilar artery.  Vertebrals codominant. Normal left anterior cerebral artery.  Diseased A1 and A2 segments right anterior cerebral.  Mild nonstenotic irregularity both distal M1 segments, middle cerebral arteries, right greater than left. Moderate irregularity of the MCA branches distal to the trifurcation bilaterally, slightly worse on the right.  Mild irregularity ambient segment right PCA.  No cerebellar branch occlusion.  No intracranial aneurysm.  IMPRESSION: Moderate intracranial atherosclerotic change affects both the middle cerebral arteries both proximally as well as distal to the trifurcation,  as well as the ambient segment right PCA.  Mild nonstenotic irregularity cavernous L ICA. There is no proximal MCA occlusion.   Original Report Authenticated By: Davonna Belling, M.D.     Scheduled Meds: . amLODipine  5 mg Oral Daily  . aspirin  300 mg Rectal Daily   Or  . aspirin  325 mg Oral Daily  . atorvastatin  80 mg Oral Daily  . doxazosin  8 mg Oral QHS  . enoxaparin (LOVENOX) injection  40 mg Subcutaneous Q24H  . hydrochlorothiazide  25 mg Oral Daily  . insulin aspart  0-15 Units Subcutaneous TID WC  . insulin aspart  4 Units Subcutaneous TID WC  . lisinopril  40 mg Oral Daily  . potassium chloride  10 mEq Oral BID   Continuous Infusions: . sodium chloride 100 mL/hr at 02/20/13 0805  . sodium chloride      Principal Problem:   Stroke Active Problems:   Occlusion and stenosis of carotid artery without mention of cerebral infarction   HTN (hypertension)   Diabetes    Hyperlipidemia    Time spent: 30 minutes    [facility] M  [facility] Pager [phone]. If 7PM-7AM, please contact night-coverage at [email or web address], password [facility] 02/20/2013, 10:09 AM  LOS: 2 days   Attending: Patient seen and examined. Await further workup including echocardiogram and carotid Dopplers.

## 2013-02-23 ENCOUNTER — Encounter: Payer: Self-pay | Admitting: Family Medicine

## 2013-02-23 ENCOUNTER — Ambulatory Visit (INDEPENDENT_AMBULATORY_CARE_PROVIDER_SITE_OTHER): Payer: Medicare Other | Admitting: Family Medicine

## 2013-02-23 VITALS — BP 168/106 | HR 90 | Temp 98.8°F | Wt 194.8 lb

## 2013-02-23 DIAGNOSIS — E119 Type 2 diabetes mellitus without complications: Secondary | ICD-10-CM

## 2013-02-23 DIAGNOSIS — I10 Essential (primary) hypertension: Secondary | ICD-10-CM | POA: Diagnosis not present

## 2013-02-23 DIAGNOSIS — I6350 Cerebral infarction due to unspecified occlusion or stenosis of unspecified cerebral artery: Secondary | ICD-10-CM

## 2013-02-23 DIAGNOSIS — I639 Cerebral infarction, unspecified: Secondary | ICD-10-CM

## 2013-02-23 MED ORDER — LEVOFLOXACIN 500 MG PO TABS: 500.0000 mg | ORAL_TABLET | Freq: Every day | ORAL | Status: DC

## 2013-02-23 MED ORDER — AMLODIPINE BESYLATE 10 MG PO TABS: 10.0000 mg | ORAL_TABLET | Freq: Every day | ORAL | Status: DC

## 2013-02-23 NOTE — Progress Notes (Signed)
  Subjective:    Patient ID: Kara Kirby, female    DOB: 05-17-1946, 67 y.o.   MRN: [id number]  Hypertension This is a chronic problem. The current episode started more than 1 year ago. The problem has been gradually worsening since onset. The problem is uncontrolled. Associated symptoms include headaches (frontal come and go). Pertinent negatives include no chest pain, neck pain, peripheral edema or shortness of breath. Risk factors for coronary artery disease include dyslipidemia, diabetes mellitus, family history and obesity. Past treatments include calcium channel blockers, direct vasodilators and diuretics. The current treatment provides mild improvement. There are no compliance problems.  Hypertensive end-organ damage includes CVA and retinopathy.   Unfortunately the hospital did not send amlodipine home with her. They told her to stop verapamil.   Review of Systems  HENT: Negative for neck pain.   Respiratory: Negative for shortness of breath.   Cardiovascular: Negative for chest pain.  Neurological: Positive for headaches (frontal come and go).       Objective:   Physical Exam Blood pressure is elevated. Eardrums normal mild sinus tenderness throat is normal neck no masses lungs are clear no crackle heart regular extremities no edema skin warm dry strength in the arms is good strength in the legs is good.       Assessment & Plan:  Sinusitis-Levaquin 10 days as directed call us at problems Allergic rhinitis-Loratadine 10 mg daily ongoing htn-Add amlodipine 10 mg. Take half tablet daily for the first 3 days then one a day. Recheck patient in one week.Continue other medicines. Stroke-Concerning for the potential for more strokes. Will discuss the case with Dr. Durwin Nora her vascular doctor. I am not certain if they will need to see her sooner.

## 2013-02-23 NOTE — Patient Instructions (Signed)
1/2 TABLET per day for 3 days then 1 per day Follow up in 1 week     Hypertension As your heart beats, it forces blood through your arteries. This force is your blood pressure. If the pressure is too high, it is called hypertension (HTN) or high blood pressure. HTN is dangerous because you may have it and not know it. High blood pressure may mean that your heart has to work harder to pump blood. Your arteries may be narrow or stiff. The extra work puts you at risk for heart disease, stroke, and other problems.  Blood pressure consists of two numbers, a higher number over a lower, 110/72, for example. It is stated as "110 over 72." The ideal is below 120 for the top number (systolic) and under 80 for the bottom (diastolic). Write down your blood pressure today. You should pay close attention to your blood pressure if you have certain conditions such as:  Heart failure.  Prior heart attack.  Diabetes  Chronic kidney disease.  Prior stroke.  Multiple risk factors for heart disease. To see if you have HTN, your blood pressure should be measured while you are seated with your arm held at the level of the heart. It should be measured at least twice. A one-time elevated blood pressure reading (especially in the Emergency Department) does not mean that you need treatment. There may be conditions in which the blood pressure is different between your right and left arms. It is important to see your caregiver soon for a recheck. Most people have essential hypertension which means that there is not a specific cause. This type of high blood pressure may be lowered by changing lifestyle factors such as:  Stress.  Smoking.  Lack of exercise.  Excessive weight.  Drug/tobacco/alcohol use.  Eating less salt. Most people do not have symptoms from high blood pressure until it has caused damage to the body. Effective treatment can often prevent, delay or reduce that damage. TREATMENT  When a cause  has been identified, treatment for high blood pressure is directed at the cause. There are a large number of medications to treat HTN. These fall into several categories, and your caregiver will help you select the medicines that are best for you. Medications may have side effects. You should review side effects with your caregiver. If your blood pressure stays high after you have made lifestyle changes or started on medicines,   Your medication(s) may need to be changed.  Other problems may need to be addressed.  Be certain you understand your prescriptions, and know how and when to take your medicine.  Be sure to follow up with your caregiver within the time frame advised (usually within two weeks) to have your blood pressure rechecked and to review your medications.  If you are taking more than one medicine to lower your blood pressure, make sure you know how and at what times they should be taken. Taking two medicines at the same time can result in blood pressure that is too low. SEEK IMMEDIATE MEDICAL CARE IF:  You develop a severe headache, blurred or changing vision, or confusion.  You have unusual weakness or numbness, or a faint feeling.  You have severe chest or abdominal pain, vomiting, or breathing problems. MAKE SURE YOU:   Understand these instructions.  Will watch your condition.  Will get help right away if you are not doing well or get worse. Document Released: 09/27/2005 Document Revised: 12/20/2011 Document Reviewed: 05/17/2008 ExitCare Patient  Information 2013 [place], [place].

## 2013-02-26 ENCOUNTER — Ambulatory Visit: Payer: Medicare Other | Admitting: Family Medicine

## 2013-03-02 ENCOUNTER — Ambulatory Visit (INDEPENDENT_AMBULATORY_CARE_PROVIDER_SITE_OTHER): Payer: Medicare Other | Admitting: Family Medicine

## 2013-03-02 ENCOUNTER — Encounter: Payer: Self-pay | Admitting: Family Medicine

## 2013-03-02 VITALS — BP 132/78 | Temp 98.2°F | Wt 194.0 lb

## 2013-03-02 DIAGNOSIS — I639 Cerebral infarction, unspecified: Secondary | ICD-10-CM

## 2013-03-02 DIAGNOSIS — I6529 Occlusion and stenosis of unspecified carotid artery: Secondary | ICD-10-CM | POA: Diagnosis not present

## 2013-03-02 DIAGNOSIS — I6350 Cerebral infarction due to unspecified occlusion or stenosis of unspecified cerebral artery: Secondary | ICD-10-CM | POA: Diagnosis not present

## 2013-03-02 DIAGNOSIS — Z8709 Personal history of other diseases of the respiratory system: Secondary | ICD-10-CM | POA: Diagnosis not present

## 2013-03-02 NOTE — Progress Notes (Signed)
  Subjective:    Patient ID: Kara Kirby, female    DOB: 19-Feb-1946, 67 y.o.   MRN: [id number]  HPI  Patient in today for recheck on bloodpressure. Verapamil was stopped and started on amlodipine 10mg . Patient wants to know if she is suppose to be on amlodipine and lisinopril. She is taking both. And also still having sinus pressure currently taking levaquin has 3 days left.  Patient denies any knee stroke symptoms history of stroke also bleeding in the eye. She is somewhat perplexed by the finding on overall carotid ultrasound Family history noncontributory Doesn't smoke  Review of Systems See above no unilateral numbness weakness or or recent headaches. Mild sinus pressure taking antibiotics getting better he    Objective:   Physical Exam  Blood pressures rechecked and was actually normal. Lungs are clear hearts regular pulse normal extremities no edema      Assessment & Plan:  #1 no evidence of any stroke #2 hypertension good control #3 sinus infection clearing #4 Will discuss case with her eye specialist as well as the vascular doctor on whether or not she needs any change in therapy such as a repeat ultrasound or avoidance of blood thinners etc. Or a more current office visit she was seen by the vascular doctor in April she has an appointment with the eye doctor in July

## 2013-03-07 ENCOUNTER — Other Ambulatory Visit: Payer: Self-pay | Admitting: Family Medicine

## 2013-03-22 DIAGNOSIS — I10 Essential (primary) hypertension: Secondary | ICD-10-CM | POA: Diagnosis not present

## 2013-03-22 DIAGNOSIS — I499 Cardiac arrhythmia, unspecified: Secondary | ICD-10-CM | POA: Diagnosis not present

## 2013-03-22 DIAGNOSIS — I6350 Cerebral infarction due to unspecified occlusion or stenosis of unspecified cerebral artery: Secondary | ICD-10-CM | POA: Diagnosis not present

## 2013-03-22 DIAGNOSIS — I63239 Cerebral infarction due to unspecified occlusion or stenosis of unspecified carotid arteries: Secondary | ICD-10-CM | POA: Diagnosis not present

## 2013-03-24 ENCOUNTER — Other Ambulatory Visit: Payer: Self-pay | Admitting: Family Medicine

## 2013-03-27 ENCOUNTER — Other Ambulatory Visit: Payer: Self-pay | Admitting: *Deleted

## 2013-03-27 MED ORDER — ATORVASTATIN CALCIUM 80 MG PO TABS: 80.0000 mg | ORAL_TABLET | Freq: Every day | ORAL | Status: DC

## 2013-03-28 ENCOUNTER — Ambulatory Visit: Payer: Medicare Other | Admitting: *Deleted

## 2013-03-28 DIAGNOSIS — I499 Cardiac arrhythmia, unspecified: Secondary | ICD-10-CM

## 2013-03-28 DIAGNOSIS — I6350 Cerebral infarction due to unspecified occlusion or stenosis of unspecified cerebral artery: Secondary | ICD-10-CM

## 2013-03-28 NOTE — Patient Instructions (Addendum)
Your physician has recommended that you wear an event monitor. Event monitors are medical devices that record the heart's electrical activity. Doctors most often [place] these monitors to diagnose arrhythmias. Arrhythmias are problems with the speed or rhythm of the heartbeat. The monitor is a small, portable device. You can wear one while you do your normal daily activities. This is usually used to diagnose what is causing palpitations/syncope (passing out).4 WEEKS, YOUR PCP ORDERED THE MONITOR AND WILL CALL YOU WITH THE RESULTS

## 2013-03-29 DIAGNOSIS — I6350 Cerebral infarction due to unspecified occlusion or stenosis of unspecified cerebral artery: Secondary | ICD-10-CM | POA: Diagnosis not present

## 2013-03-29 DIAGNOSIS — I499 Cardiac arrhythmia, unspecified: Secondary | ICD-10-CM | POA: Diagnosis not present

## 2013-03-30 ENCOUNTER — Telehealth: Payer: Self-pay | Admitting: Family Medicine

## 2013-03-30 MED ORDER — ATORVASTATIN CALCIUM 80 MG PO TABS: 80.0000 mg | ORAL_TABLET | Freq: Every day | ORAL | Status: DC

## 2013-03-30 NOTE — Telephone Encounter (Signed)
#  30, 1 qd, 6 refills

## 2013-03-30 NOTE — Telephone Encounter (Signed)
Patient was prescribed lipitor 80 mg in the hospital and she is out of refills and would like [place] to start giving her this.

## 2013-03-30 NOTE — Telephone Encounter (Signed)
RX sent into [facility]. Patient notified.

## 2013-04-02 NOTE — Progress Notes (Signed)
Event monitor, leave open until completed

## 2013-04-08 ENCOUNTER — Other Ambulatory Visit: Payer: Self-pay | Admitting: Family Medicine

## 2013-04-16 ENCOUNTER — Encounter: Payer: Self-pay | Admitting: Family Medicine

## 2013-04-16 ENCOUNTER — Ambulatory Visit (INDEPENDENT_AMBULATORY_CARE_PROVIDER_SITE_OTHER): Payer: Medicare Other | Admitting: Family Medicine

## 2013-04-16 VITALS — BP 156/80 | HR 90 | Temp 98.3°F | Wt 194.0 lb

## 2013-04-16 DIAGNOSIS — I6350 Cerebral infarction due to unspecified occlusion or stenosis of unspecified cerebral artery: Secondary | ICD-10-CM | POA: Diagnosis not present

## 2013-04-16 DIAGNOSIS — I639 Cerebral infarction, unspecified: Secondary | ICD-10-CM

## 2013-04-16 DIAGNOSIS — R252 Cramp and spasm: Secondary | ICD-10-CM

## 2013-04-16 DIAGNOSIS — R002 Palpitations: Secondary | ICD-10-CM | POA: Diagnosis not present

## 2013-04-16 MED ORDER — CYCLOBENZAPRINE HCL 10 MG PO TABS: ORAL_TABLET | ORAL | Status: DC

## 2013-04-16 MED ORDER — CHLORTHALIDONE 25 MG PO TABS: 25.0000 mg | ORAL_TABLET | Freq: Every day | ORAL | Status: DC

## 2013-04-16 NOTE — Progress Notes (Signed)
  Subjective:    Patient ID: Kara Kirby, female    DOB: 1946/03/15, 67 y.o.   MRN: [id number]  Diabetes She presents for her follow-up diabetic visit. She has type 2 diabetes mellitus. Her disease course has been improving. Hypoglycemia symptoms include sweats (early am symptoms). Associated symptoms include chest pain (c/o palpitations, no pain). Pertinent negatives for diabetes include no fatigue, no foot paresthesias, no polydipsia and no polyphagia. There are no hypoglycemic complications. Symptoms are improving.  patient in today for a check up Concerns sinus symptoms having headache, swollen glands started last Saturday  Muscle cramps in legs. Pt states she use to take flexeril and it helped she needs a refill  Tachycardia. Pt complaining of her heart racing. She is now wearing a heart monitor ordered by neurology.   Review of Systems  Constitutional: Negative for fatigue.  Cardiovascular: Positive for chest pain (c/o palpitations, no pain).  Endocrine: Negative for polydipsia and polyphagia.       Objective:   Physical Exam  Constitutional: She is oriented to person, place, and time. She appears well-developed and well-nourished.  HENT:  Head: Normocephalic.  Eyes: Pupils are equal, round, and reactive to light.  Neck: Normal range of motion. No thyromegaly present.  Cardiovascular: Normal rate, regular rhythm, normal heart sounds and intact distal pulses.   No murmur heard. Pulmonary/Chest: Effort normal and breath sounds normal. No respiratory distress. She has no wheezes.  Musculoskeletal: Normal range of motion. She exhibits no edema and no tenderness.  Lymphadenopathy:    She has no cervical adenopathy.  Neurological: She is alert and oriented to person, place, and time. She exhibits normal muscle tone.  Skin: Skin is warm and dry.  Psychiatric: She has a normal mood and affect. Her behavior is normal.          Assessment & Plan:  Palpitations-patient  relates the neurologist is following this. The EKG overall looks good the patient was reassured that I doubt there is a serious issue going on but she needs to follow through with the telemetry. If it does show atrial fib she will need to be on a different blood thinner. She will followup with [place] several weeks after this test so we can discuss further what is going on  Sinus symptoms-I. think this is viral with possible heat and humidity triggering it no need to put her on antibiotics currently call back if worse  Hypertension-subpar control stop HCTZ use chlorthalidone 25 mg every morning. Recheck patient's blood pressure when she follows up. 25-30 minutes was spent with the patient regarding all of these issues

## 2013-04-16 NOTE — Patient Instructions (Addendum)
Stop HCTZ , start Chlorthalidone 25 mg one each am

## 2013-04-17 LAB — BASIC METABOLIC PANEL
BUN: 14 mg/dL (ref 6–23)
Calcium: 9.7 mg/dL (ref 8.4–10.5)
Glucose, Bld: 206 mg/dL — ABNORMAL HIGH (ref 70–99)
Sodium: 141 mEq/L (ref 135–145)

## 2013-04-17 LAB — MAGNESIUM: Magnesium: 1.6 mg/dL (ref 1.5–2.5)

## 2013-04-18 ENCOUNTER — Encounter: Payer: Self-pay | Admitting: Family Medicine

## 2013-04-18 ENCOUNTER — Telehealth: Payer: Self-pay | Admitting: Family Medicine

## 2013-04-18 NOTE — Telephone Encounter (Signed)
She has an appt with [facility] for 05/23/13, she is to have the carotid doppler that day.

## 2013-04-18 NOTE — Telephone Encounter (Signed)
Just FYI.

## 2013-04-19 ENCOUNTER — Other Ambulatory Visit: Payer: Self-pay | Admitting: *Deleted

## 2013-04-19 DIAGNOSIS — E1139 Type 2 diabetes mellitus with other diabetic ophthalmic complication: Secondary | ICD-10-CM | POA: Diagnosis not present

## 2013-04-19 DIAGNOSIS — E113299 Type 2 diabetes mellitus with mild nonproliferative diabetic retinopathy without macular edema, unspecified eye: Secondary | ICD-10-CM | POA: Insufficient documentation

## 2013-04-19 DIAGNOSIS — E119 Type 2 diabetes mellitus without complications: Secondary | ICD-10-CM | POA: Diagnosis not present

## 2013-04-19 DIAGNOSIS — H348392 Tributary (branch) retinal vein occlusion, unspecified eye, stable: Secondary | ICD-10-CM | POA: Diagnosis not present

## 2013-04-19 DIAGNOSIS — H3581 Retinal edema: Secondary | ICD-10-CM | POA: Diagnosis not present

## 2013-04-19 DIAGNOSIS — H40009 Preglaucoma, unspecified, unspecified eye: Secondary | ICD-10-CM | POA: Diagnosis not present

## 2013-04-19 DIAGNOSIS — H2589 Other age-related cataract: Secondary | ICD-10-CM | POA: Diagnosis not present

## 2013-04-19 DIAGNOSIS — E11329 Type 2 diabetes mellitus with mild nonproliferative diabetic retinopathy without macular edema: Secondary | ICD-10-CM | POA: Diagnosis not present

## 2013-04-19 MED ORDER — ATORVASTATIN CALCIUM 80 MG PO TABS: 80.0000 mg | ORAL_TABLET | Freq: Every day | ORAL | Status: DC

## 2013-05-10 ENCOUNTER — Other Ambulatory Visit: Payer: Self-pay | Admitting: *Deleted

## 2013-05-10 DIAGNOSIS — I499 Cardiac arrhythmia, unspecified: Secondary | ICD-10-CM

## 2013-05-10 DIAGNOSIS — I6350 Cerebral infarction due to unspecified occlusion or stenosis of unspecified cerebral artery: Secondary | ICD-10-CM

## 2013-05-21 ENCOUNTER — Other Ambulatory Visit: Payer: Self-pay

## 2013-05-21 MED ORDER — CLOPIDOGREL BISULFATE 75 MG PO TABS: 75.0000 mg | ORAL_TABLET | Freq: Every day | ORAL | Status: DC

## 2013-05-22 ENCOUNTER — Encounter: Payer: Self-pay | Admitting: Vascular Surgery

## 2013-05-23 ENCOUNTER — Ambulatory Visit (INDEPENDENT_AMBULATORY_CARE_PROVIDER_SITE_OTHER): Payer: Medicare Other | Admitting: Vascular Surgery

## 2013-05-23 ENCOUNTER — Other Ambulatory Visit: Payer: Self-pay | Admitting: *Deleted

## 2013-05-23 ENCOUNTER — Encounter: Payer: Self-pay | Admitting: Vascular Surgery

## 2013-05-23 ENCOUNTER — Other Ambulatory Visit (INDEPENDENT_AMBULATORY_CARE_PROVIDER_SITE_OTHER): Payer: Medicare Other | Admitting: *Deleted

## 2013-05-23 DIAGNOSIS — Z48812 Encounter for surgical aftercare following surgery on the circulatory system: Secondary | ICD-10-CM

## 2013-05-23 DIAGNOSIS — I6529 Occlusion and stenosis of unspecified carotid artery: Secondary | ICD-10-CM

## 2013-05-23 NOTE — Progress Notes (Signed)
[facility] [place]  Patient name: Kara Kirby MRN: [id number] DOB: 1946-02-11 Sex: female  REASON FOR VISIT: right neck pain. Referred by Dr.Luking  HPI: Kara Kirby is a 67 y.o. female who I last saw on 01/10/13. She had undergone a right carotid endarterectomy in 2004. She had come in for yearly follow up visit. Her duplex showed a widely patent right carotid endarterectomy site without evidence of stenosis on the left. She was to be seen back in one year.    She states that a week ago she developed some pain in her right neck which has improved significantly. She does not remember any specific injury to her neck. She denies fever or chills. She states that she was hospitalized at [facility] spoke with a stroke and was told that her right carotid was "stopped up."  She denies any problems with swallowing. She denies hoarseness.  Past Medical History  Diagnosis Date  . DM (diabetes mellitus)   . HTN (hypertension)   . Sleep related leg cramps   . Hyperlipidemia   . GERD (gastroesophageal reflux disease)     intermittent Sx  . Allergy   . Hematuria   . Renal insufficiency, mild   . Hip arthritis 2003    Left  . Dyslipidemia   . CVA (cerebral vascular accident) 02-19-13  . Arthritis     Left shoulder   Family History  Problem Relation Age of Onset  . Colon cancer Neg Hx   . Colon polyps Neg Hx   . Hypertension Mother   . Heart attack Mother   . Hypertension Sister   . Diabetes Sister   . Diabetes Sister   . Hypertension Sister    SOCIAL HISTORY: History  Substance Use Topics  . Smoking status: Never Smoker   . Smokeless tobacco: Never Used  . Alcohol Use: No   Allergies  Allergen Reactions  . Other Hives    IV DYE    Current Outpatient Prescriptions  Medication Sig Dispense Refill  . amLODipine (NORVASC) 10 MG tablet Take 1 tablet (10 mg total) by mouth daily.  30 tablet  11  . aspirin 325 MG tablet Take 1 tablet (325 mg  total) by mouth daily.      Marland Kitchen atorvastatin (LIPITOR) 80 MG tablet Take 1 tablet (80 mg total) by mouth daily.  30 tablet  5  . chlorthalidone (HYGROTON) 25 MG tablet Take 1 tablet (25 mg total) by mouth daily.  30 tablet  6  . clopidogrel (PLAVIX) 75 MG tablet Take 1 tablet (75 mg total) by mouth daily with breakfast.  30 tablet  5  . cyclobenzaprine (FLEXERIL) 10 MG tablet Take one at night as needed for leg cramps.  30 tablet  4  . doxazosin (CARDURA) 4 MG tablet Take 2 tablets (8 mg total) by mouth at bedtime.  60 tablet  3  . fluticasone (FLONASE) 50 MCG/ACT nasal spray       . glipiZIDE (GLUCOTROL) 5 MG tablet       . KLOR-CON M10 10 MEQ tablet Take 10 mEq by mouth 2 (two) times daily.       Marland Kitchen lisinopril (PRINIVIL,ZESTRIL) 20 MG tablet TAKE TWO TABLETS BY MOUTH EVERY DAY  180 tablet  1  . metFORMIN (GLUCOPHAGE) 500 MG tablet TAKE ONE TABLET BY MOUTH TWICE DAILY  180 tablet  1  . ranitidine (ZANTAC) 300 MG tablet TAKE ONE TABLET BY MOUTH EVERY DAY  30 tablet  0  . VENTOLIN HFA 108 (90 BASE) MCG/ACT inhaler       . zolpidem (AMBIEN) 10 MG tablet Take 10 mg by mouth at bedtime as needed.        No current facility-administered medications for this visit.   REVIEW OF SYSTEMS: Arly.Keller ] denotes positive finding; [  ] denotes negative finding  CARDIOVASCULAR:  [ ]  chest pain   [ ]  chest pressure   [ ]  palpitations   [ ]  orthopnea   [ ]  dyspnea on exertion   [ ]  claudication   [ ]  rest pain   [ ]  DVT   [ ]  phlebitis PULMONARY:   [ ]  productive cough   [ ]  asthma   [ ]  wheezing CONSTITUTIONAL:  [ ]  fever   [ ]  chills  PHYSICAL EXAM: Filed Vitals:   05/23/13 1055 05/23/13 1057  BP: 182/95 180/94  Pulse: 78 80  Resp: 16   Height: 5\' 4"  (1.626 m)   Weight: 187 lb (84.823 kg)   SpO2: 99%    Body mass index is 32.08 kg/(m^2). GENERAL: The patient is a well-nourished female, in no acute distress. The vital signs are documented above. CARDIOVASCULAR: There is a regular rate and rhythm. I do not  detect carotid bruits. NECK: I did not appreciate any significant lymphadenopathy or masses on external exam. PULMONARY: There is good air exchange bilaterally without wheezing or rales. ABDOMEN: Soft and non-tender with normal pitched bowel sounds.  MUSCULOSKELETAL: There are no major deformities or cyanosis. NEUROLOGIC: No focal weakness or paresthesias are detected. SKIN: There are no ulcers or rashes noted. PSYCHIATRIC: The patient has a normal affect.  DATA:  I have independently interpreted the carotid duplex scan in our office today which shows that her right carotid endarterectomy site is widely patent. She has no evidence of carotid stenosis on the left. Both vertebral arteries are patent with antegrade flow.  MEDICAL ISSUES:  HISTORY OF CAROTID ARTERY DISEASE: I reassured the patient that she has no evidence of recurrent carotid stenosis on the right and no significant carotid stenosis on the left also. I do not know what the etiology of her right neck pain was but this seems to have essentially resolved. I think if she develops recurrent pain in her neck she may need ENT evaluation. Given that she had a carotid duplex scan today she will not need another follow up duplex for 1 year. I will plan on seeing her in August of 2015. She knows to call sooner if she has problems.  Pace Lamadrid S [facility] [place] Beeper: [phone]

## 2013-06-07 ENCOUNTER — Other Ambulatory Visit: Payer: Self-pay | Admitting: Family Medicine

## 2013-06-18 DIAGNOSIS — I10 Essential (primary) hypertension: Secondary | ICD-10-CM | POA: Diagnosis not present

## 2013-06-18 DIAGNOSIS — E669 Obesity, unspecified: Secondary | ICD-10-CM | POA: Diagnosis not present

## 2013-06-18 DIAGNOSIS — I63239 Cerebral infarction due to unspecified occlusion or stenosis of unspecified carotid arteries: Secondary | ICD-10-CM | POA: Diagnosis not present

## 2013-06-20 ENCOUNTER — Other Ambulatory Visit: Payer: Self-pay | Admitting: Family Medicine

## 2013-06-29 ENCOUNTER — Encounter: Payer: Self-pay | Admitting: Family Medicine

## 2013-06-29 ENCOUNTER — Ambulatory Visit (INDEPENDENT_AMBULATORY_CARE_PROVIDER_SITE_OTHER): Payer: Medicare Other | Admitting: Family Medicine

## 2013-06-29 VITALS — BP 116/78 | Ht 63.0 in | Wt 191.0 lb

## 2013-06-29 DIAGNOSIS — I10 Essential (primary) hypertension: Secondary | ICD-10-CM

## 2013-06-29 DIAGNOSIS — E119 Type 2 diabetes mellitus without complications: Secondary | ICD-10-CM

## 2013-06-29 DIAGNOSIS — Z23 Encounter for immunization: Secondary | ICD-10-CM

## 2013-06-29 DIAGNOSIS — E785 Hyperlipidemia, unspecified: Secondary | ICD-10-CM | POA: Diagnosis not present

## 2013-06-29 LAB — POCT GLYCOSYLATED HEMOGLOBIN (HGB A1C): Hemoglobin A1C: 7.2

## 2013-06-29 MED ORDER — GLIPIZIDE 5 MG PO TABS: ORAL_TABLET | ORAL | Status: DC

## 2013-06-29 MED ORDER — NYSTATIN 100000 UNIT/ML MT SUSP: OROMUCOSAL | Status: DC

## 2013-06-29 NOTE — Progress Notes (Signed)
  Subjective:    Patient ID: Kara Kirby, female    DOB: 04-28-1946, 67 y.o.   MRN: [id number]  HPIHere for a follow up on blood pressure.   A1C today is 7.2 The patient was seen today as part of a comprehensive diabetic check up. The patient had the following elements completed: -Review of medication compliance -Review of glucose monitoring results -Review of any complications do to high or low sugars -Diabetic foot exam was completed as part of today's visit. The following was also discussed: -Importance of yearly eye exams -Importance of following diabetic/low sugar-starch diet -Importance of exercise and regular activity -Importance of regular followup visits. -Most recent hemoglobin A1c were reviewed with the patient along with goals regarding diabetes.  She has concerns about odor in her mouth and her tongue is white in the morning.   Cramps in both legs for a couple of weeks.   Want to discuss plavix and doxazosin.   Review of Systems     Objective:   Physical Exam  Nursing note and vitals reviewed. Constitutional: She appears well-developed and well-nourished.  HENT:  Head: Normocephalic.  Right Ear: External ear normal.  Left Ear: External ear normal.  Neck: Normal range of motion. Neck supple.  Cardiovascular: Normal rate, regular rhythm, normal heart sounds and intact distal pulses.   No murmur heard. Pulmonary/Chest: Effort normal and breath sounds normal.  Skin: Skin is warm and dry.          Assessment & Plan:  #1 diabetes subpar control increase medication one tablet twice a day #2 HTN fair control watch diet closely try to reduce weight continue medication #3 recent stroke several months ago to take aspirin once daily. I agree with the neurologist to stay off the Plavix. I also believe the vascular surgeon report back shows the carotid artery is still open is probably reasonable. The patient is skeptical. I offered a second opinion she will  think about it. If we did a second opinion it would be at [facility]. #4 retinal hemorrhages stable currently she's follows with GI specialist at least every 6 months #5 cramping in the legs recent lab test look good I don't recommend repeat them at the moment #6 coating on the tongue patient states every morning she feels there is a coating on her tongue along with bad breath I told her to try nystatin oral solution #7 each one of her medicines was reviewed with her and the nurse was in attendance to go over how to take them she wrote those down for the patient. 30 minutes spent with patient

## 2013-07-07 ENCOUNTER — Other Ambulatory Visit: Payer: Self-pay | Admitting: Family Medicine

## 2013-07-11 ENCOUNTER — Other Ambulatory Visit: Payer: Self-pay | Admitting: *Deleted

## 2013-07-11 MED ORDER — DOXAZOSIN MESYLATE 4 MG PO TABS: ORAL_TABLET | ORAL | Status: DC

## 2013-08-01 ENCOUNTER — Telehealth: Payer: Self-pay | Admitting: Family Medicine

## 2013-08-01 NOTE — Telephone Encounter (Signed)
Patient was notified these BP overall look good, reassure her, keep follow up in Dec, keep up healthy choices. Patient verbalized understanding.

## 2013-08-01 NOTE — Telephone Encounter (Signed)
Left message on voicemail to return call.

## 2013-08-01 NOTE — Telephone Encounter (Signed)
These BP overall look good, reassure her, keep follow up in Dec, keep up healthy choices!!

## 2013-08-01 NOTE — Telephone Encounter (Signed)
Pt would like to know if you think her BP is running too low.  Her numbers are 139/75 on 07-07-13, 119/74 on 07-20-13, 144/79  And 138/75 on 07-28-13, 126/72 on 07-25-13.

## 2013-09-07 ENCOUNTER — Other Ambulatory Visit: Payer: Self-pay | Admitting: Family Medicine

## 2013-09-07 NOTE — Telephone Encounter (Signed)
Last seen 06/29/13

## 2013-09-07 NOTE — Telephone Encounter (Signed)
Ok 6 mo worth 

## 2013-09-14 ENCOUNTER — Other Ambulatory Visit: Payer: Self-pay | Admitting: Nurse Practitioner

## 2013-09-25 DIAGNOSIS — E119 Type 2 diabetes mellitus without complications: Secondary | ICD-10-CM | POA: Diagnosis not present

## 2013-09-25 DIAGNOSIS — H2510 Age-related nuclear cataract, unspecified eye: Secondary | ICD-10-CM | POA: Diagnosis not present

## 2013-09-28 ENCOUNTER — Ambulatory Visit (INDEPENDENT_AMBULATORY_CARE_PROVIDER_SITE_OTHER): Payer: Medicare Other | Admitting: Family Medicine

## 2013-09-28 ENCOUNTER — Telehealth: Payer: Self-pay | Admitting: *Deleted

## 2013-09-28 ENCOUNTER — Encounter: Payer: Self-pay | Admitting: Family Medicine

## 2013-09-28 VITALS — BP 138/94 | Ht 64.0 in | Wt 187.2 lb

## 2013-09-28 DIAGNOSIS — E785 Hyperlipidemia, unspecified: Secondary | ICD-10-CM | POA: Diagnosis not present

## 2013-09-28 DIAGNOSIS — Z79899 Other long term (current) drug therapy: Secondary | ICD-10-CM | POA: Diagnosis not present

## 2013-09-28 DIAGNOSIS — E119 Type 2 diabetes mellitus without complications: Secondary | ICD-10-CM

## 2013-09-28 DIAGNOSIS — I10 Essential (primary) hypertension: Secondary | ICD-10-CM | POA: Diagnosis not present

## 2013-09-28 NOTE — Patient Instructions (Signed)
Tylenol 500 mg ( acetomenphen) , take 2  Tablets every 6 hours as needed  If using Aleve then may take 2 in the am , but if causes stomach pain then stop

## 2013-09-28 NOTE — Telephone Encounter (Signed)
172 is a good goal for weight

## 2013-09-28 NOTE — Progress Notes (Signed)
   Subjective:    Patient ID: Kara Kirby, female    DOB: 22-Nov-1945, 67 y.o.   MRN: [id number]  HPI Patient is here today for diabetic check up.  She states when she checks her blood sugars, they have been in the 80's. She relates her blood sugars are sometimes in the 80s most of the time they're in the low 100s she relates that she does a good job of watching her diet she is staying physically active she has not had any strokelike symptoms she has had a history of stroke she's also had visual issues in the past but her recent visit with the eye doctor was good according to the patient.  Her A1C is 7.0   She states her knees are bothering her. She does relate that her back in her knees bother her when she stands up moves around she feels stiffness pain discomfort she wonders what she can take safely to help this.  History of stroke hypertension hyperlipidemia diabetes. Social does not smoke Family history noncontributory   Review of Systems See above.    Objective:   Physical Exam Lungs are clear heart is regular pulse normal blood pressure on recheck is actually good patient shows me several readings that she is at home which were good as well       Assessment & Plan:  #1 HTN decent control does have some element of office hypertension continue medication check blood pressures at home education was given regarding diet activity and parameters of blood pressure  #2 diabetes decent control no changes necessary currently. Continue current measures  #3 hyperlipidemia check lab work in January patient followup in approximately 3-4 months 25 minutes spent with patient

## 2013-09-28 NOTE — Telephone Encounter (Signed)
Patient would like to know what ideal weight she should be. She is 5'4 and 187lbs.

## 2013-09-28 NOTE — Telephone Encounter (Signed)
Left message on voicemail notifying patient 172 is a good goal for weight.

## 2013-10-07 ENCOUNTER — Other Ambulatory Visit: Payer: Self-pay | Admitting: Family Medicine

## 2013-10-08 ENCOUNTER — Other Ambulatory Visit: Payer: Self-pay | Admitting: Family Medicine

## 2013-10-08 DIAGNOSIS — Z139 Encounter for screening, unspecified: Secondary | ICD-10-CM

## 2013-10-09 ENCOUNTER — Ambulatory Visit (HOSPITAL_COMMUNITY)
Admission: RE | Admit: 2013-10-09 | Discharge: 2013-10-09 | Disposition: A | Discharge status: Home / Self Care | Payer: Medicare Other | Source: Ambulatory Visit | Attending: Family Medicine | Admitting: Family Medicine

## 2013-10-09 DIAGNOSIS — Z139 Encounter for screening, unspecified: Secondary | ICD-10-CM

## 2013-10-09 DIAGNOSIS — Z1231 Encounter for screening mammogram for malignant neoplasm of breast: Secondary | ICD-10-CM | POA: Insufficient documentation

## 2013-10-18 ENCOUNTER — Other Ambulatory Visit: Payer: Self-pay | Admitting: Family Medicine

## 2013-10-18 ENCOUNTER — Telehealth: Payer: Self-pay | Admitting: Family Medicine

## 2013-10-18 NOTE — Telephone Encounter (Signed)
Patient needs Rx for lisinopril and lipitor to [facility] in [place] today please

## 2013-10-18 NOTE — Telephone Encounter (Signed)
meds sent to pharm. Pt notified on voicemail.  

## 2013-10-25 DIAGNOSIS — E785 Hyperlipidemia, unspecified: Secondary | ICD-10-CM | POA: Diagnosis not present

## 2013-10-25 DIAGNOSIS — Z79899 Other long term (current) drug therapy: Secondary | ICD-10-CM | POA: Diagnosis not present

## 2013-10-25 DIAGNOSIS — I10 Essential (primary) hypertension: Secondary | ICD-10-CM | POA: Diagnosis not present

## 2013-10-25 DIAGNOSIS — E119 Type 2 diabetes mellitus without complications: Secondary | ICD-10-CM | POA: Diagnosis not present

## 2013-10-25 LAB — LIPID PANEL
Cholesterol: 163 mg/dL (ref 0–200)
HDL: 41 mg/dL (ref >39)
LDL Cholesterol: 101 mg/dL — ABNORMAL HIGH (ref 0–99)
Total CHOL/HDL Ratio: 4 Ratio
Triglycerides: 104 mg/dL (ref <150)
VLDL: 21 mg/dL (ref 0–40)

## 2013-10-25 LAB — BASIC METABOLIC PANEL
BUN: 17 mg/dL (ref 6–23)
CO2: 30 mEq/L (ref 19–32)
Calcium: 9.6 mg/dL (ref 8.4–10.5)
Chloride: 104 mEq/L (ref 96–112)
Creat: 0.96 mg/dL (ref 0.50–1.10)
Glucose, Bld: 144 mg/dL — ABNORMAL HIGH (ref 70–99)
Potassium: 3.7 mEq/L (ref 3.5–5.3)
Sodium: 141 mEq/L (ref 135–145)

## 2013-10-25 LAB — HEPATIC FUNCTION PANEL
ALT: 13 U/L (ref 0–35)
AST: 12 U/L (ref 0–37)
Albumin: 4.1 g/dL (ref 3.5–5.2)
Alkaline Phosphatase: 55 U/L (ref 39–117)
Bilirubin, Direct: 0.1 mg/dL (ref 0.0–0.3)
Indirect Bilirubin: 0.3 mg/dL (ref 0.0–0.9)
Total Bilirubin: 0.4 mg/dL (ref 0.3–1.2)
Total Protein: 6.8 g/dL (ref 6.0–8.3)

## 2013-10-26 ENCOUNTER — Encounter: Payer: Self-pay | Admitting: Family Medicine

## 2013-10-26 LAB — MICROALBUMIN, URINE: Microalb, Ur: 1.24 mg/dL (ref 0.00–1.89)

## 2013-11-01 DIAGNOSIS — H2589 Other age-related cataract: Secondary | ICD-10-CM | POA: Diagnosis not present

## 2013-11-01 DIAGNOSIS — H3581 Retinal edema: Secondary | ICD-10-CM | POA: Diagnosis not present

## 2013-11-01 DIAGNOSIS — H348392 Tributary (branch) retinal vein occlusion, unspecified eye, stable: Secondary | ICD-10-CM | POA: Diagnosis not present

## 2013-11-01 DIAGNOSIS — E11311 Type 2 diabetes mellitus with unspecified diabetic retinopathy with macular edema: Secondary | ICD-10-CM | POA: Diagnosis not present

## 2013-11-01 DIAGNOSIS — E11329 Type 2 diabetes mellitus with mild nonproliferative diabetic retinopathy without macular edema: Secondary | ICD-10-CM | POA: Diagnosis not present

## 2013-11-01 DIAGNOSIS — H40009 Preglaucoma, unspecified, unspecified eye: Secondary | ICD-10-CM | POA: Diagnosis not present

## 2013-11-01 DIAGNOSIS — E1139 Type 2 diabetes mellitus with other diabetic ophthalmic complication: Secondary | ICD-10-CM | POA: Diagnosis not present

## 2013-11-01 DIAGNOSIS — E119 Type 2 diabetes mellitus without complications: Secondary | ICD-10-CM | POA: Diagnosis not present

## 2013-11-06 ENCOUNTER — Other Ambulatory Visit: Payer: Self-pay | Admitting: Family Medicine

## 2013-11-06 ENCOUNTER — Other Ambulatory Visit: Payer: Self-pay | Admitting: *Deleted

## 2013-11-06 ENCOUNTER — Telehealth: Payer: Self-pay | Admitting: Family Medicine

## 2013-11-06 MED ORDER — CHLORTHALIDONE 25 MG PO TABS: 25.0000 mg | ORAL_TABLET | Freq: Every day | ORAL | Status: DC

## 2013-11-06 MED ORDER — GLIPIZIDE 5 MG PO TABS: ORAL_TABLET | ORAL | Status: DC

## 2013-11-06 NOTE — Telephone Encounter (Signed)
meds sent to [facility] [place]. Pt stated she did not want to use [place] [facility]. Pt notified

## 2013-11-06 NOTE — Telephone Encounter (Signed)
Patient needs refills on glipizide 5 mg and  chlorthalidone 25 mg .[facility]

## 2013-11-20 ENCOUNTER — Telehealth: Payer: Self-pay | Admitting: Family Medicine

## 2013-11-20 NOTE — Telephone Encounter (Signed)
Error

## 2013-11-20 NOTE — Telephone Encounter (Signed)
cyclobenzaprine (FLEXERIL) 10 MG tablet  Please refill this med, pt has not had since 04/23/13 Leg giving her pain again  [facility]

## 2013-11-20 NOTE — Telephone Encounter (Signed)
Flexeril 10 mg #30 one Q8 hours when necessary severe spasms but patient not to use medication if driving. Cautioned drowsiness

## 2013-11-21 ENCOUNTER — Telehealth: Payer: Self-pay | Admitting: Family Medicine

## 2013-11-21 MED ORDER — CYCLOBENZAPRINE HCL 10 MG PO TABS: ORAL_TABLET | ORAL | Status: DC

## 2013-11-21 NOTE — Telephone Encounter (Signed)
Error

## 2013-11-21 NOTE — Telephone Encounter (Signed)
Med sent to [facility] in [place]. Pt notified.

## 2013-11-28 ENCOUNTER — Other Ambulatory Visit: Payer: Self-pay | Admitting: Vascular Surgery

## 2013-11-28 DIAGNOSIS — I6529 Occlusion and stenosis of unspecified carotid artery: Secondary | ICD-10-CM

## 2013-11-28 DIAGNOSIS — Z48812 Encounter for surgical aftercare following surgery on the circulatory system: Secondary | ICD-10-CM

## 2013-12-07 ENCOUNTER — Telehealth: Payer: Self-pay | Admitting: Family Medicine

## 2013-12-07 ENCOUNTER — Other Ambulatory Visit: Payer: Self-pay | Admitting: Nurse Practitioner

## 2013-12-07 MED ORDER — POTASSIUM CHLORIDE CRYS ER 10 MEQ PO TBCR: EXTENDED_RELEASE_TABLET | ORAL | Status: DC

## 2013-12-07 NOTE — Telephone Encounter (Signed)
Order sent in 

## 2013-12-07 NOTE — Telephone Encounter (Signed)
Patient needs Rx for K-Dur    [facility]

## 2013-12-07 NOTE — Telephone Encounter (Signed)
Pt notified via V/M

## 2013-12-15 ENCOUNTER — Other Ambulatory Visit: Payer: Self-pay | Admitting: Family Medicine

## 2013-12-17 DIAGNOSIS — I10 Essential (primary) hypertension: Secondary | ICD-10-CM | POA: Diagnosis not present

## 2013-12-17 DIAGNOSIS — E669 Obesity, unspecified: Secondary | ICD-10-CM | POA: Diagnosis not present

## 2013-12-17 DIAGNOSIS — I63239 Cerebral infarction due to unspecified occlusion or stenosis of unspecified carotid arteries: Secondary | ICD-10-CM | POA: Diagnosis not present

## 2014-01-16 ENCOUNTER — Other Ambulatory Visit: Payer: Medicare Other

## 2014-01-16 ENCOUNTER — Ambulatory Visit: Payer: Medicare Other | Admitting: Vascular Surgery

## 2014-01-29 ENCOUNTER — Ambulatory Visit (INDEPENDENT_AMBULATORY_CARE_PROVIDER_SITE_OTHER): Payer: Medicare Other | Admitting: Family Medicine

## 2014-01-29 ENCOUNTER — Other Ambulatory Visit: Payer: Self-pay | Admitting: Family Medicine

## 2014-01-29 ENCOUNTER — Encounter: Payer: Self-pay | Admitting: Family Medicine

## 2014-01-29 ENCOUNTER — Telehealth: Payer: Self-pay | Admitting: *Deleted

## 2014-01-29 VITALS — BP 138/84 | Ht 64.0 in | Wt 193.2 lb

## 2014-01-29 DIAGNOSIS — I70209 Unspecified atherosclerosis of native arteries of extremities, unspecified extremity: Secondary | ICD-10-CM | POA: Diagnosis not present

## 2014-01-29 DIAGNOSIS — I6529 Occlusion and stenosis of unspecified carotid artery: Secondary | ICD-10-CM | POA: Diagnosis not present

## 2014-01-29 DIAGNOSIS — M949 Disorder of cartilage, unspecified: Secondary | ICD-10-CM

## 2014-01-29 DIAGNOSIS — I10 Essential (primary) hypertension: Secondary | ICD-10-CM

## 2014-01-29 DIAGNOSIS — E119 Type 2 diabetes mellitus without complications: Secondary | ICD-10-CM

## 2014-01-29 DIAGNOSIS — E785 Hyperlipidemia, unspecified: Secondary | ICD-10-CM

## 2014-01-29 DIAGNOSIS — Z23 Encounter for immunization: Secondary | ICD-10-CM | POA: Diagnosis not present

## 2014-01-29 DIAGNOSIS — M8580 Other specified disorders of bone density and structure, unspecified site: Secondary | ICD-10-CM

## 2014-01-29 DIAGNOSIS — Z8709 Personal history of other diseases of the respiratory system: Secondary | ICD-10-CM

## 2014-01-29 DIAGNOSIS — M899 Disorder of bone, unspecified: Secondary | ICD-10-CM

## 2014-01-29 LAB — POCT GLYCOSYLATED HEMOGLOBIN (HGB A1C): Hemoglobin A1C: 6.3

## 2014-01-29 MED ORDER — DOXAZOSIN MESYLATE 4 MG PO TABS: ORAL_TABLET | ORAL | Status: DC

## 2014-01-29 NOTE — Progress Notes (Signed)
   Subjective:    Patient ID: Kara Kirby, female    DOB: [birth year], 68 y.o.   MRN: [id number]  Diabetes She presents for her initial diabetic visit. She has type 2 diabetes mellitus. Risk factors for coronary artery disease include diabetes mellitus, hypertension and dyslipidemia. Current diabetic treatment includes oral agent (dual therapy). She is compliant with treatment all of the time. She is following a diabetic diet.   Patient states that her legs get tired when she dances and mows the yard or walks a lot. Patient stated she is also having problems with her sinuses. Patient denies rectal bleeding. She is keeping up-to-date on ophthalmology visits. Patient denies any particular worries currently. She is frustrated about how her legs are aching. Review of Systems Denies chest tightness pressure pain shortness breath she does relate aching in her legs when she does any significant walking dancing her yard work she has to stop he gets better then she resumes it.    Objective:   Physical Exam Eardrums are normal neck is normal no masses lungs are clear no crackles heart is regular blood pressure recheck 130/70 extremities no edema skin warm dry diabetic foot exam completed       Assessment & Plan:  #1 allergies-allergy medication Flonase as directed. No sign of sinusitis currently  #2 hypertension blood pressure on recheck 130/70 reduce doxazosin down to 1 per day. Continue other medicines as is  #3 diabetes control very good continue current measures watch diet recheck A1c in approximately 4 months  #4 check bone density history of osteopenia

## 2014-01-29 NOTE — Telephone Encounter (Signed)
[facility]

## 2014-01-29 NOTE — Telephone Encounter (Signed)
Need to notify pt appt for ultrasound is at [facility] radiology 02/01/14 register 9am. And bone density test at [facility] diagnostic register 10:45 on same day 02/01/14.

## 2014-01-29 NOTE — Telephone Encounter (Signed)
Notified pt appt for ultrasound is at [facility] radiology 02/01/14 register 9am. And bone density test at [facility] diagnostic register 10:45 on same day 02/01/14. Patient verbalized understanding.

## 2014-02-01 ENCOUNTER — Ambulatory Visit (HOSPITAL_COMMUNITY)
Admission: RE | Admit: 2014-02-01 | Discharge: 2014-02-01 | Disposition: A | Discharge status: Home / Self Care | Payer: Medicare Other | Source: Ambulatory Visit | Attending: Family Medicine | Admitting: Family Medicine

## 2014-02-01 ENCOUNTER — Other Ambulatory Visit: Payer: Self-pay | Admitting: Family Medicine

## 2014-02-01 ENCOUNTER — Encounter: Payer: Self-pay | Admitting: Family Medicine

## 2014-02-01 DIAGNOSIS — Z780 Asymptomatic menopausal state: Secondary | ICD-10-CM | POA: Diagnosis not present

## 2014-02-01 DIAGNOSIS — I70219 Atherosclerosis of native arteries of extremities with intermittent claudication, unspecified extremity: Secondary | ICD-10-CM | POA: Insufficient documentation

## 2014-02-01 DIAGNOSIS — I739 Peripheral vascular disease, unspecified: Secondary | ICD-10-CM | POA: Diagnosis not present

## 2014-02-01 DIAGNOSIS — M949 Disorder of cartilage, unspecified: Secondary | ICD-10-CM | POA: Diagnosis not present

## 2014-02-01 DIAGNOSIS — M899 Disorder of bone, unspecified: Secondary | ICD-10-CM | POA: Diagnosis not present

## 2014-02-01 DIAGNOSIS — I70209 Unspecified atherosclerosis of native arteries of extremities, unspecified extremity: Secondary | ICD-10-CM

## 2014-02-04 ENCOUNTER — Other Ambulatory Visit: Payer: Self-pay | Admitting: Family Medicine

## 2014-02-04 NOTE — Progress Notes (Signed)
Patient notified and verbalized understanding of the test results. No further questions. 

## 2014-02-13 ENCOUNTER — Other Ambulatory Visit: Payer: Self-pay | Admitting: Family Medicine

## 2014-02-14 ENCOUNTER — Other Ambulatory Visit: Payer: Self-pay | Admitting: Family Medicine

## 2014-02-26 ENCOUNTER — Other Ambulatory Visit: Payer: Self-pay | Admitting: Family Medicine

## 2014-02-26 ENCOUNTER — Telehealth: Payer: Self-pay | Admitting: Family Medicine

## 2014-02-26 MED ORDER — CEFPROZIL 500 MG PO TABS: 500.0000 mg | ORAL_TABLET | Freq: Two times a day (BID) | ORAL | Status: DC

## 2014-02-26 NOTE — Telephone Encounter (Signed)
Patient said that she was here about 3 weeks ago and mentioned to Dr. Nicki Reaper that she was sick. She also said she thought he was going to call her something in for this, but nothing is in the computer saying so. She said her sinuses are still painful, wheezing, congestion, and cough. She is hoping to have something called in.Kara Kirby

## 2014-02-26 NOTE — Telephone Encounter (Signed)
Tell pt I sent in Cefzil 500 one 2 times a day for 10 days

## 2014-02-26 NOTE — Telephone Encounter (Signed)
Pt notified med sent to pharm.  

## 2014-03-08 ENCOUNTER — Other Ambulatory Visit: Payer: Self-pay | Admitting: Family Medicine

## 2014-03-12 ENCOUNTER — Ambulatory Visit (INDEPENDENT_AMBULATORY_CARE_PROVIDER_SITE_OTHER): Payer: Medicare Other | Admitting: Family Medicine

## 2014-03-12 ENCOUNTER — Encounter: Payer: Self-pay | Admitting: Family Medicine

## 2014-03-12 VITALS — BP 148/88 | Ht 64.0 in | Wt 191.0 lb

## 2014-03-12 DIAGNOSIS — J019 Acute sinusitis, unspecified: Secondary | ICD-10-CM | POA: Diagnosis not present

## 2014-03-12 DIAGNOSIS — I6529 Occlusion and stenosis of unspecified carotid artery: Secondary | ICD-10-CM

## 2014-03-12 MED ORDER — CEFPROZIL 500 MG PO TABS: 500.0000 mg | ORAL_TABLET | Freq: Two times a day (BID) | ORAL | Status: DC

## 2014-03-12 MED ORDER — FLUCONAZOLE 150 MG PO TABS: 150.0000 mg | ORAL_TABLET | Freq: Once | ORAL | Status: DC

## 2014-03-12 MED ORDER — GLIPIZIDE 5 MG PO TABS: ORAL_TABLET | ORAL | Status: DC

## 2014-03-12 NOTE — Progress Notes (Signed)
   Subjective:    Patient ID: Kara Kirby, female    DOB: Oct 01, 1946, 68 y.o.   MRN: [id number]  Sinusitis This is a new problem. Episode onset: 5/19 she called and got a rx for Cefzil. The problem has been gradually worsening since onset. There has been no fever. Associated symptoms include congestion, coughing, headaches, a hoarse voice and sinus pressure. Pertinent negatives include no ear pain or shortness of breath. Past treatments include antibiotics. The treatment provided no relief.   Felt dizzy at times over Sat and Sun Has hx of stroke Some headache Drainage for awhile More sinus sx for past several days Denies any specific weakness    Review of Systems  Constitutional: Negative for fever and activity change.  HENT: Positive for congestion, hoarse voice, rhinorrhea and sinus pressure. Negative for ear pain.   Eyes: Negative for discharge.  Respiratory: Positive for cough. Negative for shortness of breath and wheezing.   Cardiovascular: Negative for chest pain.  Neurological: Positive for headaches.       Objective:   Physical Exam  Nursing note and vitals reviewed. Constitutional: She appears well-developed.  HENT:  Head: Normocephalic.  Nose: Nose normal.  Mouth/Throat: Oropharynx is clear and moist. No oropharyngeal exudate.  Neck: Neck supple.  Cardiovascular: Normal rate and normal heart sounds.   No murmur heard. Pulmonary/Chest: Effort normal and breath sounds normal. She has no wheezes.  Lymphadenopathy:    She has no cervical adenopathy.  Skin: Skin is warm and dry.    Finger to nose is normal Romberg is negative patient has good ability to walk turn around without difficulty     Assessment & Plan:  Possible underlying sinusitis along with allergies should gradually get better with medication warning signs discussed there is absolutely no sign stroke warning signs for stroke reviewed it she has as followup otherwise followup for her diabetic  checkup at that point in time in August

## 2014-04-08 ENCOUNTER — Other Ambulatory Visit: Payer: Self-pay | Admitting: Family Medicine

## 2014-04-11 ENCOUNTER — Telehealth: Payer: Self-pay | Admitting: Family Medicine

## 2014-04-11 NOTE — Telephone Encounter (Signed)
Error

## 2014-04-15 ENCOUNTER — Other Ambulatory Visit: Payer: Self-pay | Admitting: Family Medicine

## 2014-04-16 ENCOUNTER — Ambulatory Visit: Payer: Medicare Other | Admitting: Vascular Surgery

## 2014-04-16 ENCOUNTER — Other Ambulatory Visit: Payer: Medicare Other

## 2014-04-23 ENCOUNTER — Other Ambulatory Visit: Payer: Self-pay | Admitting: Family Medicine

## 2014-04-23 NOTE — Telephone Encounter (Signed)
Last seen 03/12/14.

## 2014-05-02 DIAGNOSIS — H348392 Tributary (branch) retinal vein occlusion, unspecified eye, stable: Secondary | ICD-10-CM | POA: Diagnosis not present

## 2014-05-02 DIAGNOSIS — E11329 Type 2 diabetes mellitus with mild nonproliferative diabetic retinopathy without macular edema: Secondary | ICD-10-CM | POA: Diagnosis not present

## 2014-05-02 DIAGNOSIS — E1139 Type 2 diabetes mellitus with other diabetic ophthalmic complication: Secondary | ICD-10-CM | POA: Diagnosis not present

## 2014-05-02 DIAGNOSIS — H3581 Retinal edema: Secondary | ICD-10-CM | POA: Diagnosis not present

## 2014-05-02 DIAGNOSIS — H40009 Preglaucoma, unspecified, unspecified eye: Secondary | ICD-10-CM | POA: Diagnosis not present

## 2014-05-02 DIAGNOSIS — H2589 Other age-related cataract: Secondary | ICD-10-CM | POA: Diagnosis not present

## 2014-05-11 LAB — HM DIABETES EYE EXAM

## 2014-05-14 ENCOUNTER — Other Ambulatory Visit: Payer: Self-pay | Admitting: Family Medicine

## 2014-05-18 IMAGING — CR DG SHOULDER 2+V*L*
3 series · 3 of 3 positions shown · non-contrast
Comparison: None.

CLINICAL DATA: Shoulder tendonitis

LEFT SHOULDER - 2+ VIEW

[view not recorded (1 of 3)]
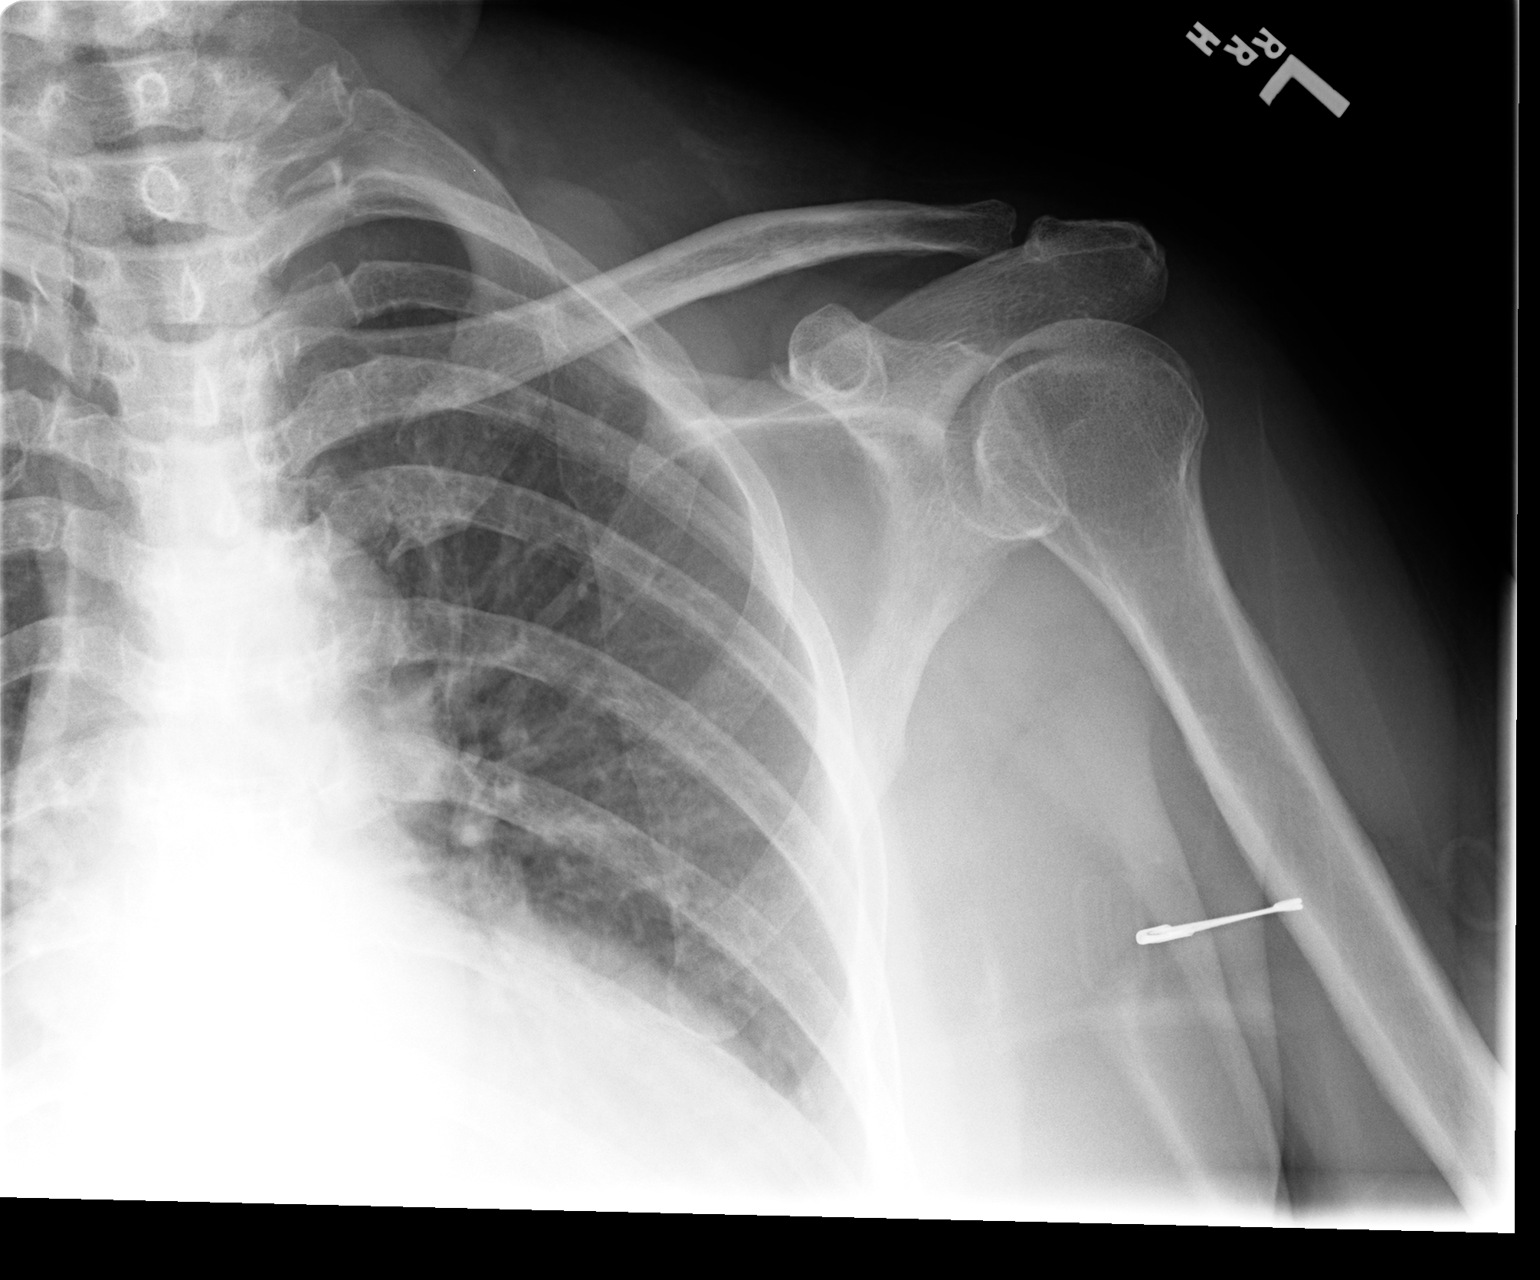

[view not recorded (2 of 3)]
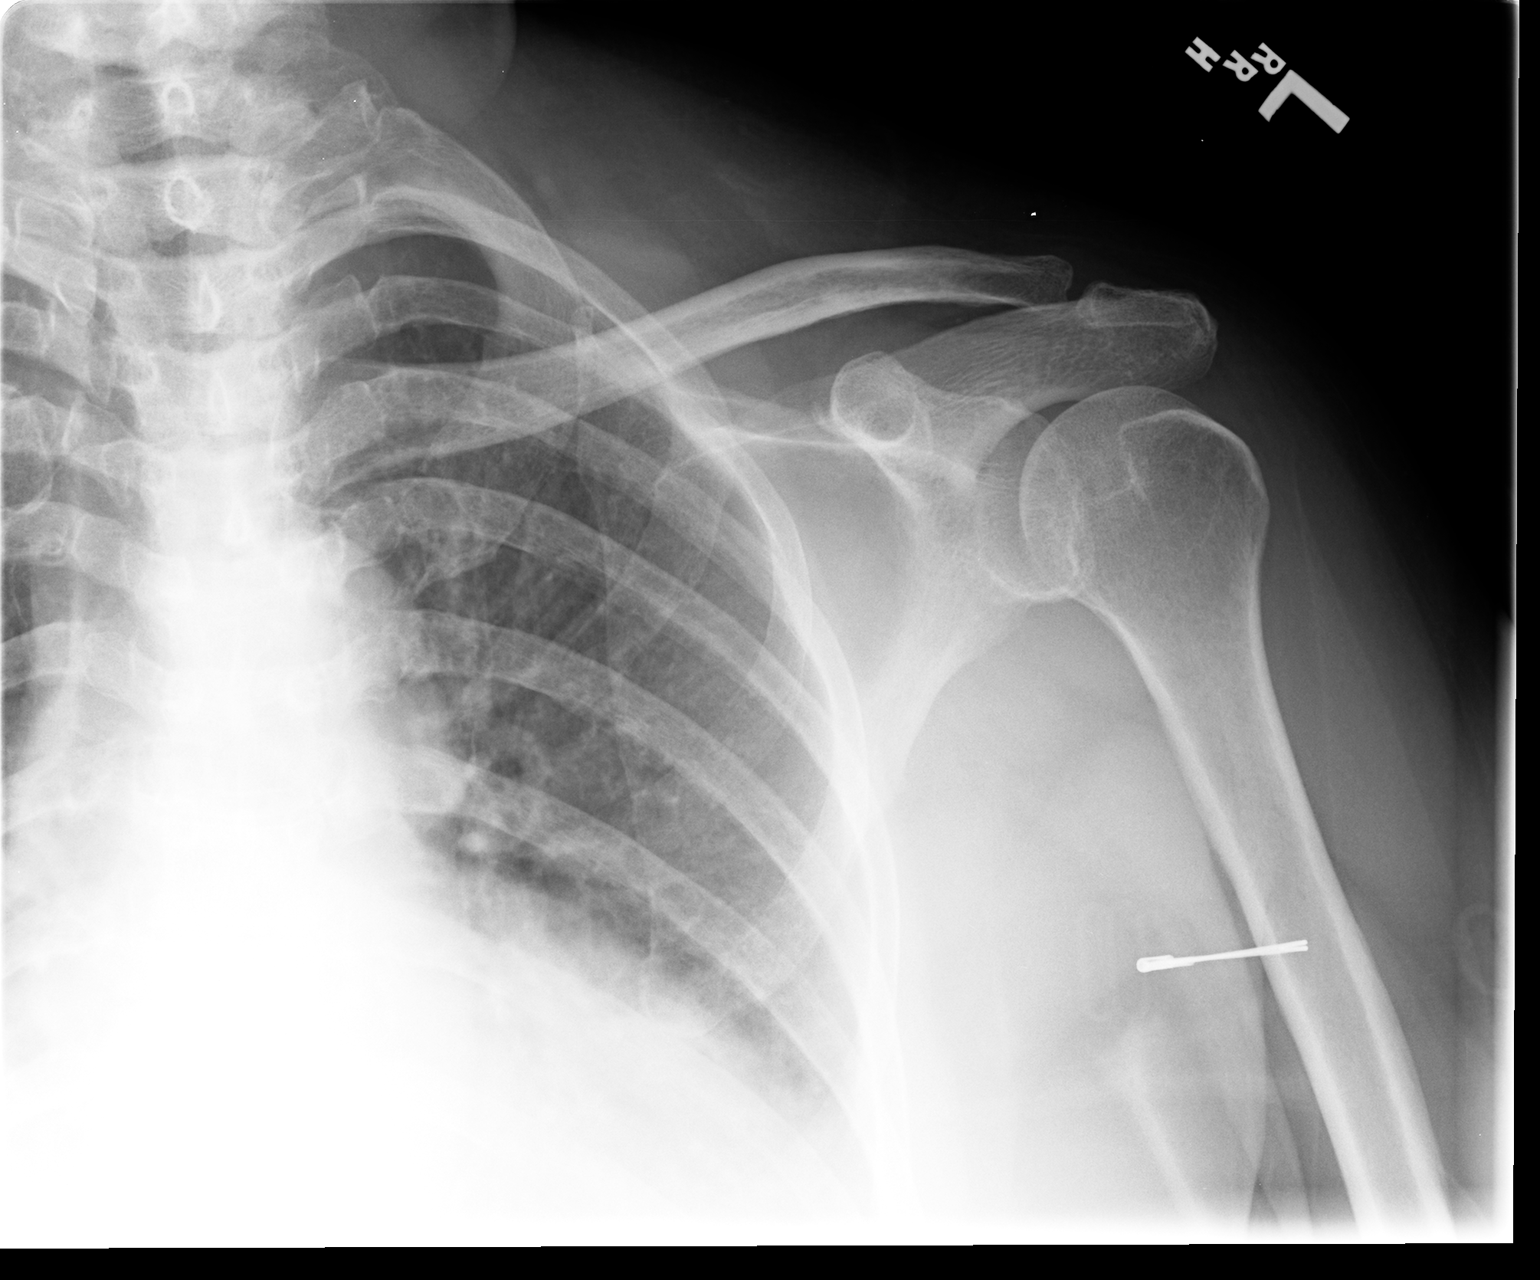

[view not recorded (3 of 3)]
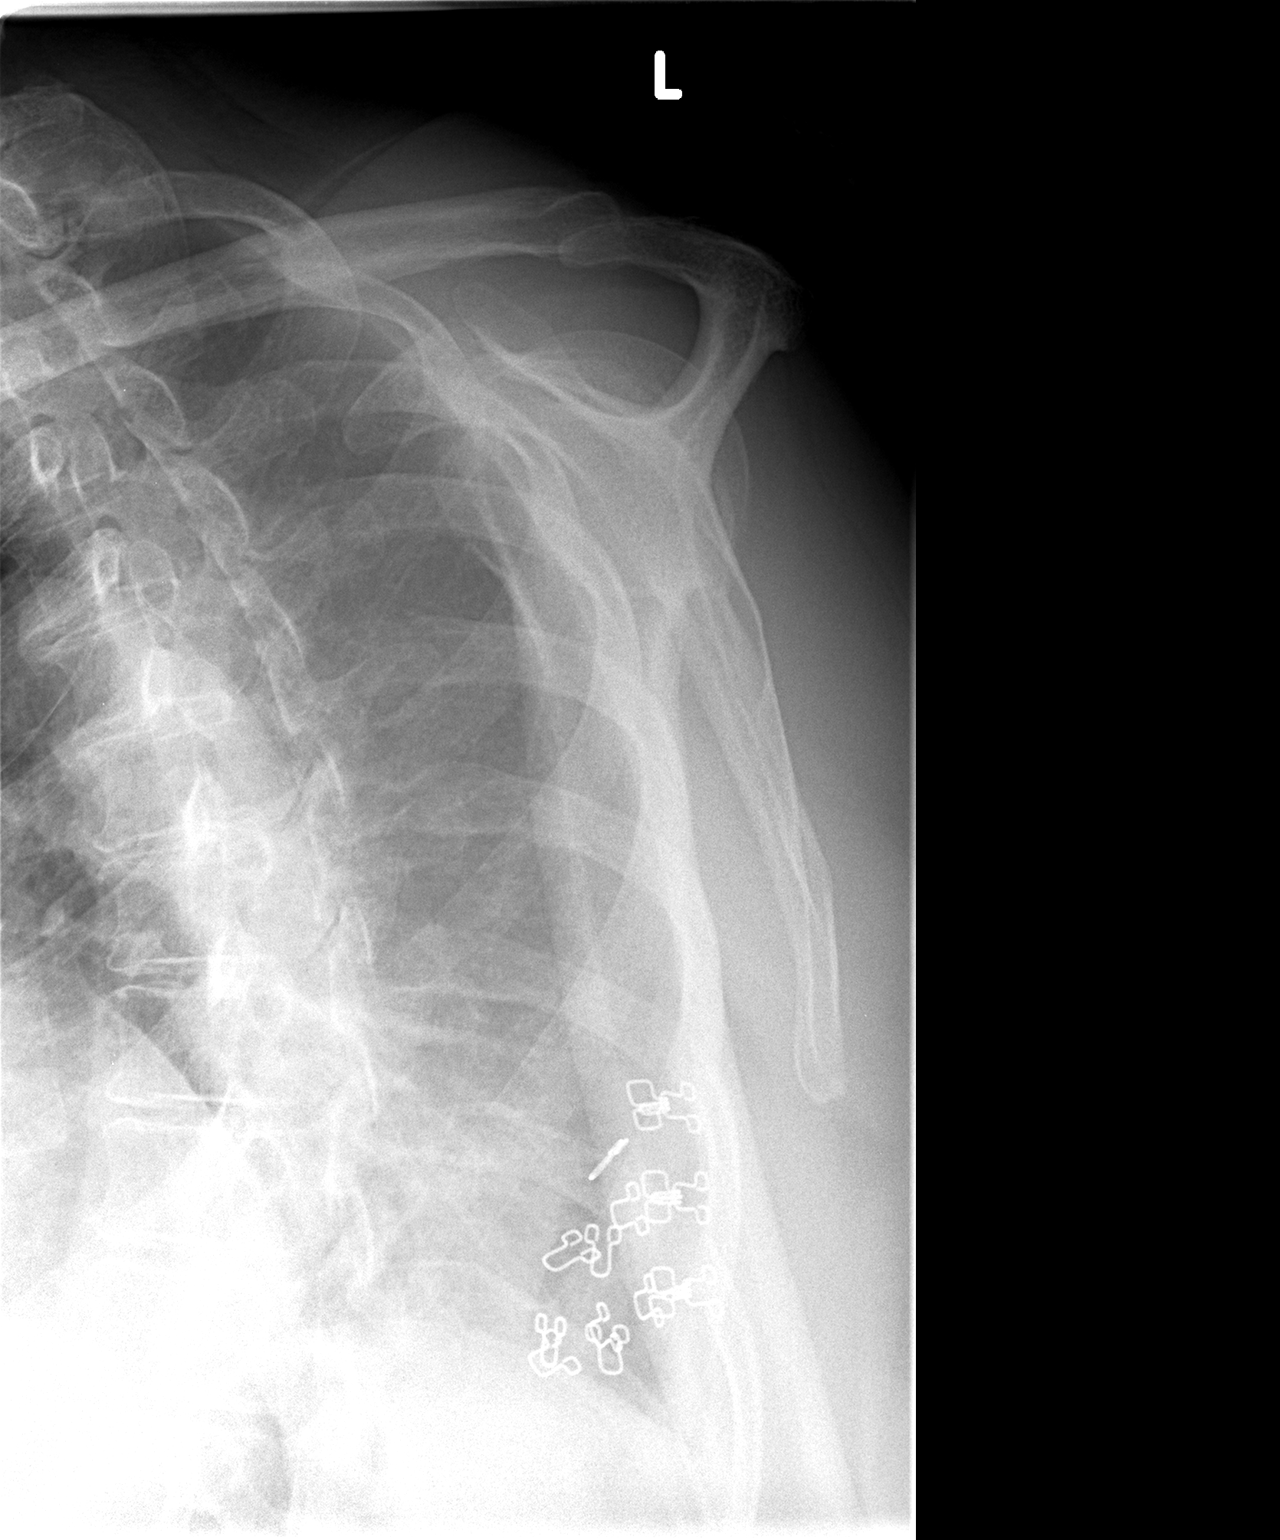

[3 of 3 positions shown; findings below may reference images not displayed]

FINDINGS: Three views of the left shoulder submitted.  No acute
fracture or subluxation.  Glenohumeral joint is preserved.
IMPRESSION: No acute fracture or subluxation.

## 2014-05-21 ENCOUNTER — Encounter: Payer: Self-pay | Admitting: Family

## 2014-05-22 ENCOUNTER — Encounter: Payer: Self-pay | Admitting: Family

## 2014-05-22 ENCOUNTER — Ambulatory Visit (HOSPITAL_COMMUNITY)
Admission: RE | Admit: 2014-05-22 | Discharge: 2014-05-22 | Disposition: A | Discharge status: Home / Self Care | Payer: Medicare Other | Source: Ambulatory Visit | Attending: Family | Admitting: Family

## 2014-05-22 ENCOUNTER — Ambulatory Visit (INDEPENDENT_AMBULATORY_CARE_PROVIDER_SITE_OTHER): Payer: Medicare Other | Admitting: Family

## 2014-05-22 VITALS — BP 153/92 | HR 79 | Resp 16 | Ht 64.0 in | Wt 190.0 lb

## 2014-05-22 DIAGNOSIS — I6529 Occlusion and stenosis of unspecified carotid artery: Secondary | ICD-10-CM

## 2014-05-22 DIAGNOSIS — Z48812 Encounter for surgical aftercare following surgery on the circulatory system: Secondary | ICD-10-CM | POA: Diagnosis not present

## 2014-05-22 NOTE — Patient Instructions (Signed)
Stroke Prevention Some medical conditions and behaviors are associated with an increased chance of having a stroke. You may prevent a stroke by making healthy choices and managing medical conditions. HOW CAN I REDUCE MY RISK OF HAVING A STROKE?   Stay physically active. Get at least 30 minutes of activity on most or all days.  Do not smoke. It may also be helpful to avoid exposure to secondhand smoke.  Limit alcohol use. Moderate alcohol use is considered to be:  No more than 2 drinks per day for men.  No more than 1 drink per day for nonpregnant women.  Eat healthy foods. This involves:  Eating 5 or more servings of fruits and vegetables a day.  Making dietary changes that address high blood pressure (hypertension), high cholesterol, diabetes, or obesity.  Manage your cholesterol levels.  Making food choices that are high in fiber and low in saturated fat, trans fat, and cholesterol may control cholesterol levels.  Take any prescribed medicines to control cholesterol as directed by your health care provider.  Manage your diabetes.  Controlling your carbohydrate and sugar intake is recommended to manage diabetes.  Take any prescribed medicines to control diabetes as directed by your health care provider.  Control your hypertension.  Making food choices that are low in salt (sodium), saturated fat, trans fat, and cholesterol is recommended to manage hypertension.  Take any prescribed medicines to control hypertension as directed by your health care provider.  Maintain a healthy weight.  Reducing calorie intake and making food choices that are low in sodium, saturated fat, trans fat, and cholesterol are recommended to manage weight.  Stop drug abuse.  Avoid taking birth control pills.  Talk to your health care provider about the risks of taking birth control pills if you are over 35 years old, smoke, get migraines, or have ever had a blood clot.  Get evaluated for sleep  disorders (sleep apnea).  Talk to your health care provider about getting a sleep evaluation if you snore a lot or have excessive sleepiness.  Take medicines only as directed by your health care provider.  For some people, aspirin or blood thinners (anticoagulants) are helpful in reducing the risk of forming abnormal blood clots that can lead to stroke. If you have the irregular heart rhythm of atrial fibrillation, you should be on a blood thinner unless there is a good reason you cannot take them.  Understand all your medicine instructions.  Make sure that other conditions (such as anemia or atherosclerosis) are addressed. SEEK IMMEDIATE MEDICAL CARE IF:   You have sudden weakness or numbness of the face, arm, or leg, especially on one side of the body.  Your face or eyelid droops to one side.  You have sudden confusion.  You have trouble speaking (aphasia) or understanding.  You have sudden trouble seeing in one or both eyes.  You have sudden trouble walking.  You have dizziness.  You have a loss of balance or coordination.  You have a sudden, severe headache with no known cause.  You have new chest pain or an irregular heartbeat. Any of these symptoms may represent a serious problem that is an emergency. Do not wait to see if the symptoms will go away. Get medical help at once. Call your local emergency services (911 in [place] Do not drive yourself to the hospital. Document Released: 11/04/2004 Document Revised: 02/11/2014 Document Reviewed: 03/30/2013 [employer] Patient Information 2015 [employer]. This information is not intended to replace advice given   to you by your health care provider. Make sure you discuss any questions you have with your health care provider.  

## 2014-05-22 NOTE — Progress Notes (Signed)
Established Carotid Patient   History of Present Illness  Kara Kirby is a 68 y.o. female patient of Dr. Scot Dock who is s/p right carotid endarterectomy in 2004. She returns today for follow up.  Patient has Positive history of TIA  symptom in 2004 just before the right CEA as manifested by slurred speech, left leg weakness, twisting of face, denies monocular loss of vision. Her left leg feels a little week occasionally, otherwise no residual neurological deficits.  She states that she also had a TIA in 2014, as manifested by left leg weakness that lasted for about a week, denies slurred speech. Pt denies any further stroke or TIA symptoms since then, saw a neurologist. Pt denies new medical problems or surgeries since last visit.  Pt Diabetic: Yes, states her A1C was 6.3, in May, 2015 Pt smoker: non-smoker  Pt meds include: Statin : Yes ASA: Yes Other anticoagulants/antiplatelets: no, took Plavix for a few months after her last TIA, pt states it was prescribed by her neurologist   Past Medical History  Diagnosis Date  . DM (diabetes mellitus)   . HTN (hypertension)   . Sleep related leg cramps   . Hyperlipidemia   . GERD (gastroesophageal reflux disease)     intermittent Sx  . Allergy   . Hematuria   . Renal insufficiency, mild   . Hip arthritis 2003    Left  . Dyslipidemia   . CVA (cerebral vascular accident) 02-19-13  . Arthritis     Left shoulder    Social History History  Substance Use Topics  . Smoking status: Never Smoker   . Smokeless tobacco: Never Used  . Alcohol Use: No    Family History Family History  Problem Relation Age of Onset  . Colon cancer Neg Hx   . Colon polyps Neg Hx   . Hypertension Mother   . Heart attack Mother   . Hypertension Sister   . Diabetes Sister   . Diabetes Sister   . Hypertension Sister     Surgical History Past Surgical History  Procedure Laterality Date  . S/p hysterectomy    . Colonoscopy    . Total  abdominal hysterectomy      FIBROID, HYPERMENORRHEA  . Colonoscopy  10/19/2011    Procedure: COLONOSCOPY;  Surgeon: Dorothyann Peng, MD;  Location: [facility];  Service: Endoscopy;  Laterality: N/A;  10:15  . Carotid endarterectomy  RIGHT    Allergies  Allergen Reactions  . Other Hives    IV DYE     Current Outpatient Prescriptions  Medication Sig Dispense Refill  . amLODipine (NORVASC) 10 MG tablet TAKE ONE TABLET BY MOUTH ONCE DAILY  30 tablet  5  . aspirin 325 MG tablet Take 1 tablet (325 mg total) by mouth daily.      Marland Kitchen atorvastatin (LIPITOR) 80 MG tablet TAKE ONE TABLET BY MOUTH ONCE DAILY  30 tablet  2  . cefPROZIL (CEFZIL) 500 MG tablet Take 1 tablet (500 mg total) by mouth 2 (two) times daily.  20 tablet  0  . chlorthalidone (HYGROTON) 25 MG tablet TAKE ONE TABLET BY MOUTH ONCE DAILY **STOP HCTZ**  30 tablet  0  . cyclobenzaprine (FLEXERIL) 10 MG tablet Take one every 8 hours prn spasms. Do not use while driving. Caution drowsiness.  30 tablet  0  . doxazosin (CARDURA) 4 MG tablet Take one tablet by mouth at bedtime.  30 tablet  5  . fluconazole (DIFLUCAN) 150 MG tablet Take  1 tablet (150 mg total) by mouth once.  1 tablet  4  . fluticasone (FLONASE) 50 MCG/ACT nasal spray USE TWO SPRAY IN EACH NOSTRIL EVERY DAY  16 g  5  . glipiZIDE (GLUCOTROL) 5 MG tablet TAKE ONE TABLET BY MOUTH TWICE DAILY  60 tablet  6  . lisinopril (PRINIVIL,ZESTRIL) 20 MG tablet TAKE TWO TABLETS BY MOUTH ONCE DAILY  180 tablet  0  . metFORMIN (GLUCOPHAGE) 500 MG tablet TAKE ONE TABLET BY MOUTH TWICE DAILY -  NEEDS  OFFICE  VISIT  180 tablet  1  . potassium chloride (KLOR-CON M10) 10 MEQ tablet TAKE TWO TABLETS BY MOUTH EVERY DAY  180 tablet  1  . ranitidine (ZANTAC) 300 MG tablet TAKE ONE TABLET BY MOUTH ONCE DAILY  30 tablet  5  . VENTOLIN HFA 108 (90 BASE) MCG/ACT inhaler       . zolpidem (AMBIEN) 10 MG tablet TAKE ONE TABLET BY MOUTH EVERY DAY AT BEDTIME AS NEEDED  30 tablet  5   No current  facility-administered medications for this visit.    Review of Systems : See HPI for pertinent positives and negatives.  Physical Examination   General: WDWN female in NAD GAIT: normal Eyes: PERRLA Pulmonary:  Non-labored, CTAB, Negative  Rales, Negative rhonchi, & Negative wheezing.  Cardiac: regular Rhythm ,  Positive detected murmur.  VASCULAR EXAM Carotid Bruits Right Left   Negative Negative    Aorta is not palpable. Radial pulses are 2+ palpable and equal.                                                                                                                     Gastrointestinal: soft, nontender, BS WNL, no r/g,  negative masses.  Musculoskeletal: Negative muscle atrophy/wasting. M/S 5/5 throughout, Extremities without ischemic changes.  Neurologic: A&O X 3; Appropriate Affect ; SENSATION ;normal;  Speech is normal CN 2-12 intact, Pain and light touch intact in extremities, Motor exam as listed above.   Non-Invasive Vascular Imaging CAROTID DUPLEX 05/22/2014   CEREBROVASCULAR DUPLEX EVALUATION    INDICATION: Carotid artery disease     PREVIOUS INTERVENTION(S): Right carotid endarterectomy 2004.    DUPLEX EXAM:     RIGHT  LEFT  Peak Systolic Velocities (cm/s) End Diastolic Velocities (cm/s) Plaque LOCATION Peak Systolic Velocities (cm/s) End Diastolic Velocities (cm/s) Plaque  63 10  CCA PROXIMAL 83 17   73 16  CCA MID 80 20   60 13  CCA DISTAL 82 15   363 39  ECA 95 7   89 20  ICA PROXIMAL 64 15 CP  92 23  ICA MID 70 15   74 23  ICA DISTAL 83 19      ICA / CCA Ratio (PSV) 1.03  Antegrade  Vertebral Flow Antegrade   035 Brachial Systolic Pressure (mmHg) 009  Triphasic  Brachial Artery Waveforms Triphasic     Plaque Morphology:  HM = Homogeneous, HT = Heterogeneous, CP = Calcific Plaque, SP =  Smooth Plaque, IP = Irregular Plaque     ADDITIONAL FINDINGS:     IMPRESSION: Patent right carotid endarterectomy site with no evidence of hyperplasia or  restenosis.  Left internal carotid artery velocities suggest a <40% stenosis; however, velocities may be underestimated due to calcific plaque with acoustic shadowing.     Compared to the previous exam:  No significant change in comparison to the last exam on 05/23/2013.    Assessment: RODINA PINALES is a 68 y.o. female who presents with asymptomatic patent right carotid endarterectomy site with no evidence of hyperplasia or restenosis.  Left internal carotid artery velocities suggest a <40% stenosis; however, velocities may be underestimated due to calcific plaque with acoustic shadowing.  No significant change in comparison to the last exam on 05/23/2013.  Plan: Follow-up in 1 year with Carotid Duplex scan.   I discussed in depth with the patient the nature of atherosclerosis, and emphasized the importance of maximal medical management including strict control of blood pressure, blood glucose, and lipid levels, obtaining regular exercise, and continued cessation of smoking.  The patient is aware that without maximal medical management the underlying atherosclerotic disease process will progress, limiting the benefit of any interventions. The patient was given information about stroke prevention and what symptoms should prompt the patient to seek immediate medical care. Thank you for allowing [place] to participate in this patient's care.  Clemon Chambers, RN, MSN, FNP-C [facility] Office: [phone]  Clinic Physician: Scot Dock  05/22/2014 11:11 AM

## 2014-05-22 NOTE — Addendum Note (Signed)
Addended by: Mena Goes on: 05/22/2014 05:29 PM   Modules accepted: Orders

## 2014-05-27 ENCOUNTER — Ambulatory Visit (INDEPENDENT_AMBULATORY_CARE_PROVIDER_SITE_OTHER): Payer: Medicare Other | Admitting: Family Medicine

## 2014-05-27 ENCOUNTER — Encounter: Payer: Self-pay | Admitting: Family Medicine

## 2014-05-27 VITALS — BP 122/80 | Ht 64.0 in | Wt 192.0 lb

## 2014-05-27 DIAGNOSIS — I10 Essential (primary) hypertension: Secondary | ICD-10-CM | POA: Diagnosis not present

## 2014-05-27 DIAGNOSIS — Z23 Encounter for immunization: Secondary | ICD-10-CM | POA: Diagnosis not present

## 2014-05-27 DIAGNOSIS — E785 Hyperlipidemia, unspecified: Secondary | ICD-10-CM | POA: Diagnosis not present

## 2014-05-27 DIAGNOSIS — E119 Type 2 diabetes mellitus without complications: Secondary | ICD-10-CM | POA: Diagnosis not present

## 2014-05-27 DIAGNOSIS — Z79899 Other long term (current) drug therapy: Secondary | ICD-10-CM

## 2014-05-27 DIAGNOSIS — I6529 Occlusion and stenosis of unspecified carotid artery: Secondary | ICD-10-CM | POA: Diagnosis not present

## 2014-05-27 LAB — HEPATIC FUNCTION PANEL
ALT: 15 U/L (ref 0–35)
AST: 15 U/L (ref 0–37)
Albumin: 4.2 g/dL (ref 3.5–5.2)
Alkaline Phosphatase: 57 U/L (ref 39–117)
Bilirubin, Direct: 0.1 mg/dL (ref 0.0–0.3)
Indirect Bilirubin: 0.4 mg/dL (ref 0.2–1.2)
Total Bilirubin: 0.5 mg/dL (ref 0.2–1.2)
Total Protein: 6.6 g/dL (ref 6.0–8.3)

## 2014-05-27 LAB — BASIC METABOLIC PANEL
BUN: 13 mg/dL (ref 6–23)
CO2: 27 mEq/L (ref 19–32)
Calcium: 9.4 mg/dL (ref 8.4–10.5)
Chloride: 103 mEq/L (ref 96–112)
Creat: 0.89 mg/dL (ref 0.50–1.10)
GLUCOSE: 137 mg/dL — AB (ref 70–99)
POTASSIUM: 3.6 meq/L (ref 3.5–5.3)
SODIUM: 141 meq/L (ref 135–145)

## 2014-05-27 LAB — LIPID PANEL
CHOL/HDL RATIO: 3.3 ratio
CHOLESTEROL: 143 mg/dL (ref 0–200)
HDL: 43 mg/dL (ref >39)
LDL Cholesterol: 84 mg/dL (ref 0–99)
Triglycerides: 80 mg/dL (ref <150)
VLDL: 16 mg/dL (ref 0–40)

## 2014-05-27 LAB — POCT GLYCOSYLATED HEMOGLOBIN (HGB A1C): Hemoglobin A1C: 6.8

## 2014-05-27 MED ORDER — DOXAZOSIN MESYLATE 4 MG PO TABS: ORAL_TABLET | ORAL | Status: DC

## 2014-05-27 MED ORDER — ZOLPIDEM TARTRATE 10 MG PO TABS: 10.0000 mg | ORAL_TABLET | Freq: Every evening | ORAL | Status: DC | PRN

## 2014-05-27 MED ORDER — GLIPIZIDE 5 MG PO TABS: ORAL_TABLET | ORAL | Status: DC

## 2014-05-27 MED ORDER — LISINOPRIL 20 MG PO TABS: 20.0000 mg | ORAL_TABLET | Freq: Every day | ORAL | Status: DC

## 2014-05-27 MED ORDER — METFORMIN HCL 500 MG PO TABS: 500.0000 mg | ORAL_TABLET | Freq: Two times a day (BID) | ORAL | Status: DC

## 2014-05-27 MED ORDER — RANITIDINE HCL 300 MG PO TABS: 300.0000 mg | ORAL_TABLET | Freq: Every day | ORAL | Status: DC

## 2014-05-27 MED ORDER — POTASSIUM CHLORIDE CRYS ER 10 MEQ PO TBCR: EXTENDED_RELEASE_TABLET | ORAL | Status: DC

## 2014-05-27 NOTE — Patient Instructions (Signed)
DASH Eating Plan °DASH stands for "Dietary Approaches to Stop Hypertension." The DASH eating plan is a healthy eating plan that has been shown to reduce high blood pressure (hypertension). Additional health benefits may include reducing the risk of type 2 diabetes mellitus, heart disease, and stroke. The DASH eating plan may also help with weight loss. °WHAT DO I NEED TO KNOW ABOUT THE DASH EATING PLAN? °For the DASH eating plan, you will follow these general guidelines: °· Choose foods with a percent daily value for sodium of less than 5% (as listed on the food label). °· Use salt-free seasonings or herbs instead of table salt or sea salt. °· Check with your health care provider or pharmacist before using salt substitutes. °· Eat lower-sodium products, often labeled as "lower sodium" or "no salt added." °· Eat fresh foods. °· Eat more vegetables, fruits, and low-fat dairy products. °· Choose whole grains. Look for the word "whole" as the first word in the ingredient list. °· Choose fish and skinless chicken or turkey more often than red meat. Limit fish, poultry, and meat to 6 oz (170 g) each day. °· Limit sweets, desserts, sugars, and sugary drinks. °· Choose heart-healthy fats. °· Limit cheese to 1 oz (28 g) per day. °· Eat more home-cooked food and less restaurant, buffet, and fast food. °· Limit fried foods. °· Cook foods using methods other than frying. °· Limit canned vegetables. If you do use them, rinse them well to decrease the sodium. °· When eating at a restaurant, ask that your food be prepared with less salt, or no salt if possible. °WHAT FOODS CAN I EAT? °Seek help from a dietitian for individual calorie needs. °Grains °Whole grain or whole wheat bread. Brown rice. Whole grain or whole wheat pasta. Quinoa, bulgur, and whole grain cereals. Low-sodium cereals. Corn or whole wheat flour tortillas. Whole grain cornbread. Whole grain crackers. Low-sodium crackers. °Vegetables °Fresh or frozen vegetables  (raw, steamed, roasted, or grilled). Low-sodium or reduced-sodium tomato and vegetable juices. Low-sodium or reduced-sodium tomato sauce and paste. Low-sodium or reduced-sodium canned vegetables.  °Fruits °All fresh, canned (in natural juice), or frozen fruits. °Meat and Other Protein Products °Ground beef (85% or leaner), grass-fed beef, or beef trimmed of fat. Skinless chicken or turkey. Ground chicken or turkey. Pork trimmed of fat. All fish and seafood. Eggs. Dried beans, peas, or lentils. Unsalted nuts and seeds. Unsalted canned beans. °Dairy °Low-fat dairy products, such as skim or 1% milk, 2% or reduced-fat cheeses, low-fat ricotta or cottage cheese, or plain low-fat yogurt. Low-sodium or reduced-sodium cheeses. °Fats and Oils °Tub margarines without trans fats. Light or reduced-fat mayonnaise and salad dressings (reduced sodium). Avocado. Safflower, olive, or canola oils. Natural peanut or almond butter. °Other °Unsalted popcorn and pretzels. °The items listed above may not be a complete list of recommended foods or beverages. Contact your dietitian for more options. °WHAT FOODS ARE NOT RECOMMENDED? °Grains °White bread. White pasta. White rice. Refined cornbread. Bagels and croissants. Crackers that contain trans fat. °Vegetables °Creamed or fried vegetables. Vegetables in a cheese sauce. Regular canned vegetables. Regular canned tomato sauce and paste. Regular tomato and vegetable juices. °Fruits °Dried fruits. Canned fruit in light or heavy syrup. Fruit juice. °Meat and Other Protein Products °Fatty cuts of meat. Ribs, chicken wings, bacon, sausage, bologna, salami, chitterlings, fatback, hot dogs, bratwurst, and packaged luncheon meats. Salted nuts and seeds. Canned beans with salt. °Dairy °Whole or 2% milk, cream, half-and-half, and cream cheese. Whole-fat or sweetened yogurt. Full-fat   cheeses or blue cheese. Nondairy creamers and whipped toppings. Processed cheese, cheese spreads, or cheese  curds. °Condiments °Onion and garlic salt, seasoned salt, table salt, and sea salt. Canned and packaged gravies. Worcestershire sauce. Tartar sauce. Barbecue sauce. Teriyaki sauce. Soy sauce, including reduced sodium. Steak sauce. Fish sauce. Oyster sauce. Cocktail sauce. Horseradish. Ketchup and mustard. Meat flavorings and tenderizers. Bouillon cubes. Hot sauce. Tabasco sauce. Marinades. Taco seasonings. Relishes. °Fats and Oils °Butter, stick margarine, lard, shortening, ghee, and bacon fat. Coconut, palm kernel, or palm oils. Regular salad dressings. °Other °Pickles and olives. Salted popcorn and pretzels. °The items listed above may not be a complete list of foods and beverages to avoid. Contact your dietitian for more information. °WHERE CAN I FIND MORE INFORMATION? °National Heart, Lung, and Blood Institute: www.nhlbi.nih.gov/health/health-topics/topics/dash/ °Document Released: 09/16/2011 Document Revised: 02/11/2014 Document Reviewed: 08/01/2013 °ExitCare® Patient Information ©2015 [employer]. This information is not intended to replace advice given to you by your health care provider. Make sure you discuss any questions you have with your health care provider. ° °

## 2014-05-27 NOTE — Progress Notes (Signed)
   Subjective:    Patient ID: Kara Kirby, female    DOB: [birth year], 68 y.o.   MRN: [id number]  Diabetes She presents for her follow-up diabetic visit. She has type 2 diabetes mellitus. Pertinent negatives for hypoglycemia include no confusion. Pertinent negatives for diabetes include no chest pain, no fatigue, no polydipsia, no polyphagia and no weakness. Current diabetic treatment includes oral agent (dual therapy). She is compliant with treatment all of the time. She is following a diabetic diet. She participates in exercise three times a week. Her breakfast blood glucose range is generally 110-130 mg/dl. She does not see a podiatrist.Eye exam is current.    Bilateral hip pain. This bothers her more when she walks. She uses an occasional Aleve  We discussed at length the importance of taking her cholesterol medicine and eating healthy  We also discussed at length her recent visit with the vascular doctor.  Review of Systems  Constitutional: Negative for fever, activity change, appetite change and fatigue.  HENT: Positive for congestion, rhinorrhea and sinus pressure.   Respiratory: Negative for cough, choking and chest tightness.   Cardiovascular: Negative for chest pain and leg swelling.  Gastrointestinal: Negative for abdominal pain and abdominal distention.  Endocrine: Negative for polydipsia and polyphagia.  Genitourinary: Negative for frequency.  Neurological: Negative for weakness.  Psychiatric/Behavioral: Negative for confusion.       Objective:   Physical Exam  Vitals reviewed. Constitutional: She appears well-nourished. No distress.  Cardiovascular: Normal rate, regular rhythm and normal heart sounds.   No murmur heard. Pulmonary/Chest: Effort normal and breath sounds normal. No respiratory distress.  Musculoskeletal: She exhibits no edema.  Lymphadenopathy:    She has no cervical adenopathy.  Neurological: She is alert. She exhibits normal muscle tone.    Psychiatric: Her behavior is normal.          Assessment & Plan:  Hips- Tylenol safer, she does have arthritis that is evidenced on a recent x-ray from a year ago I would use Tylenol for discomfort. Avoid NSAIDs  Diabetes-decent control, patient frustrated at times but I feel she is doing a good job watching her diet she is to watch diet stay physically active   Hyperlipid-she is to have her cholesterol checked this is ordered today No sign of any new strokes   Carotid patient had seen her vascular specialist he didn't as he recommends repeating again in one years time  Sinus congestion related to allergies uses Flonase and saline nasal spray

## 2014-05-28 ENCOUNTER — Encounter: Payer: Self-pay | Admitting: Family Medicine

## 2014-05-30 ENCOUNTER — Telehealth: Payer: Self-pay | Admitting: Family Medicine

## 2014-05-30 NOTE — Telephone Encounter (Signed)
Please see form on front of paper chart, not enough documentation in Epic for me to feel comfortable completing form.  Please complete so that I may fax in for prior auth, please advise

## 2014-06-03 ENCOUNTER — Encounter: Payer: Self-pay | Admitting: Family Medicine

## 2014-06-03 DIAGNOSIS — G4700 Insomnia, unspecified: Secondary | ICD-10-CM | POA: Insufficient documentation

## 2014-06-03 NOTE — Telephone Encounter (Signed)
Nurses, please call the patient. We are having difficulty getting her Ambien improved. We need some more information in order to submit to her insurance company. Please fine now what else she has tried for insomnia in the past. Did anything else seemed to help? Did anything in the past that she tried not help?Marland Kitchen Asked her how long she has been suffering with insomnia? Also asked her if the EMB and helps. Please n current studies showed that for females 5 mg of Ambien is the recommended dose. Not 10 mg. In addition to this informed her that many insurance companies are refusing to pay for sleep aids for seniors age 69 and a because of the fear that they could fall during the night fracture at their hip. We will try to get the medication approved but we need more information thank you

## 2014-06-04 NOTE — Telephone Encounter (Signed)
Patient states that the only medication she has ever taken for insomnia is Ambien. Patient states she has been taking this for a couple of years now. Taking 5 mg is ok with her because she has been cutting the pill in half anyway. The Ambien works sometimes and sometimes it doesn't.

## 2014-06-07 NOTE — Telephone Encounter (Signed)
Faxed form & Dr. Bary Leriche letter to pt's [employer] coverage for determination

## 2014-06-10 NOTE — Telephone Encounter (Signed)
Rx prior auth APPROVED for pt's Zolpidem tartrate, good 10/11/13-10/10/14 through her [place] [employer] coverage, faxed approval to [facility]

## 2014-06-12 ENCOUNTER — Other Ambulatory Visit: Payer: Self-pay | Admitting: Family Medicine

## 2014-06-24 DIAGNOSIS — I63239 Cerebral infarction due to unspecified occlusion or stenosis of unspecified carotid arteries: Secondary | ICD-10-CM | POA: Diagnosis not present

## 2014-06-24 DIAGNOSIS — I6990 Unspecified sequelae of unspecified cerebrovascular disease: Secondary | ICD-10-CM | POA: Diagnosis not present

## 2014-06-24 DIAGNOSIS — G3184 Mild cognitive impairment, so stated: Secondary | ICD-10-CM | POA: Diagnosis not present

## 2014-06-24 DIAGNOSIS — I10 Essential (primary) hypertension: Secondary | ICD-10-CM | POA: Diagnosis not present

## 2014-07-14 ENCOUNTER — Other Ambulatory Visit: Payer: Self-pay | Admitting: Family Medicine

## 2014-07-29 ENCOUNTER — Telehealth: Payer: Self-pay | Admitting: Family Medicine

## 2014-07-29 NOTE — Telephone Encounter (Signed)
Pt states that she has been taking 2 of her Lisinopril since her last visit in August Her bottle says 1 daily an she just realized it. Your reorder from that visit was dropped  To 1 daily, instead of the 2 she was taking.  Do you want her on one daily? Or was this  An error and  Either way she will need a refill sooner rather than later on this script. She will call  Back for that when she gets closer to running out .  Please advise

## 2014-07-30 MED ORDER — LISINOPRIL 20 MG PO TABS: 40.0000 mg | ORAL_TABLET | Freq: Every day | ORAL | Status: DC

## 2014-07-30 NOTE — Telephone Encounter (Signed)
Rx corrected and sent electronically to pharmacy. Patient notified.

## 2014-07-30 NOTE — Telephone Encounter (Signed)
This is most likely an Public affairs consultant. Please reorder the medication as 2 per day for her blood pressure. May send him 90 days with 3 refills. Keep all followups

## 2014-08-14 ENCOUNTER — Other Ambulatory Visit: Payer: Self-pay | Admitting: Family Medicine

## 2014-08-15 ENCOUNTER — Other Ambulatory Visit: Payer: Self-pay | Admitting: *Deleted

## 2014-08-15 MED ORDER — CHLORTHALIDONE 25 MG PO TABS: ORAL_TABLET | ORAL | Status: DC

## 2014-08-26 ENCOUNTER — Other Ambulatory Visit: Payer: Self-pay | Admitting: Family Medicine

## 2014-09-10 ENCOUNTER — Encounter: Payer: Self-pay | Admitting: Family Medicine

## 2014-09-10 ENCOUNTER — Ambulatory Visit (INDEPENDENT_AMBULATORY_CARE_PROVIDER_SITE_OTHER): Payer: Medicare Other | Admitting: Family Medicine

## 2014-09-10 VITALS — BP 136/72 | Ht 64.0 in | Wt 190.0 lb

## 2014-09-10 DIAGNOSIS — R350 Frequency of micturition: Secondary | ICD-10-CM | POA: Diagnosis not present

## 2014-09-10 DIAGNOSIS — I10 Essential (primary) hypertension: Secondary | ICD-10-CM

## 2014-09-10 DIAGNOSIS — E119 Type 2 diabetes mellitus without complications: Secondary | ICD-10-CM | POA: Diagnosis not present

## 2014-09-10 DIAGNOSIS — Z23 Encounter for immunization: Secondary | ICD-10-CM

## 2014-09-10 DIAGNOSIS — E785 Hyperlipidemia, unspecified: Secondary | ICD-10-CM | POA: Diagnosis not present

## 2014-09-10 DIAGNOSIS — I6529 Occlusion and stenosis of unspecified carotid artery: Secondary | ICD-10-CM | POA: Diagnosis not present

## 2014-09-10 DIAGNOSIS — M2550 Pain in unspecified joint: Secondary | ICD-10-CM

## 2014-09-10 LAB — POCT URINALYSIS DIPSTICK: PH UA: 7

## 2014-09-10 LAB — POCT GLYCOSYLATED HEMOGLOBIN (HGB A1C): HEMOGLOBIN A1C: 7

## 2014-09-10 MED ORDER — SULFAMETHOXAZOLE-TRIMETHOPRIM 800-160 MG PO TABS
1.0000 | ORAL_TABLET | Freq: Two times a day (BID) | ORAL | Status: DC
Start: 2014-09-10 — End: 2014-12-10

## 2014-09-10 MED ORDER — AMLODIPINE BESYLATE 10 MG PO TABS: 10.0000 mg | ORAL_TABLET | Freq: Every day | ORAL | Status: DC

## 2014-09-10 NOTE — Progress Notes (Signed)
   Subjective:    Patient ID: Kara Kirby, female    DOB: 02/22/1946, 68 y.o.   MRN: [id number]  Diabetes She presents for her follow-up diabetic visit. She has type 2 diabetes mellitus. Pertinent negatives for hypoglycemia include no confusion. Pertinent negatives for diabetes include no chest pain, no fatigue, no polydipsia, no polyphagia and no weakness. Current diabetic treatment includes oral agent (dual therapy). She is compliant with treatment all of the time. (Blood sugar readings Up to 200)   Having frequent urinationand low abd pain. Started a couple weeks ago.   Joint pain in elbow, ankles, and hips. Started over 2 weeks ago.   Wants to discuss starting coQ10  Review of Systems  Constitutional: Negative for activity change, appetite change and fatigue.  Respiratory: Negative for apnea, cough, choking and shortness of breath.   Cardiovascular: Negative for chest pain, palpitations and leg swelling.  Endocrine: Negative for polydipsia and polyphagia.  Genitourinary: Negative for frequency.  Musculoskeletal: Positive for arthralgias. Negative for myalgias, back pain, joint swelling, gait problem and neck pain.  Neurological: Negative for weakness.  Psychiatric/Behavioral: Negative for confusion.       Objective:   Physical Exam  Constitutional: She appears well-nourished. No distress.  Cardiovascular: Normal rate, regular rhythm and normal heart sounds.   No murmur heard. Pulmonary/Chest: Effort normal and breath sounds normal. No respiratory distress.  Musculoskeletal: She exhibits no edema.  Lymphadenopathy:    She has no cervical adenopathy.  Neurological: She is alert. She exhibits normal muscle tone.  Psychiatric: Her behavior is normal.  Vitals reviewed.         Assessment & Plan:  Arthralgias-possibly age-related I doubt that there is any underlying rheumatoid arthritis Tylenol for discomfort if it progresses we would recommend testing  Diabetes  decent control watch diet watch portions try to bring weight down recheck A1c in 3 months  May use CoQ10 if desires I don't feel it's going to cause any problems  Frequent urination probable urinary tract infection check urine culture antibiotics prescribed warnings discuss  HTN stable  Hyperlipidemia continue current measures I don't feel this is causing arthralgias. Comprehensive lab work by spring[facility]

## 2014-09-11 LAB — MICROALBUMIN, URINE: Microalb, Ur: 0.8 mg/dL (ref <2.0)

## 2014-09-12 ENCOUNTER — Encounter: Payer: Self-pay | Admitting: Family Medicine

## 2014-09-12 LAB — URINE CULTURE
Colony Count: NO GROWTH
Organism ID, Bacteria: NO GROWTH

## 2014-09-15 ENCOUNTER — Other Ambulatory Visit: Payer: Self-pay | Admitting: Family Medicine

## 2014-09-16 ENCOUNTER — Other Ambulatory Visit: Payer: Self-pay | Admitting: Family Medicine

## 2014-09-17 NOTE — Progress Notes (Signed)
Patient notified and verbalized understanding of the test results. No further questions. 

## 2014-09-18 ENCOUNTER — Other Ambulatory Visit: Payer: Self-pay | Admitting: Family Medicine

## 2014-09-25 ENCOUNTER — Telehealth: Payer: Self-pay | Admitting: Family Medicine

## 2014-09-25 MED ORDER — GLUCOSE BLOOD VI STRP: ORAL_STRIP | Status: DC

## 2014-09-25 NOTE — Telephone Encounter (Signed)
Rx sent electronically to pharmacy. 

## 2014-09-25 NOTE — Telephone Encounter (Signed)
Can we resend the test strips to [facility] please They are telling the pt they need an prior authorization However we have not seen that come across so hoping we will  Get them to send one  Thanks

## 2014-09-25 NOTE — Telephone Encounter (Signed)
This patient has diabetes. Her testing should be once daily. Please send in appropriate order to [place].

## 2014-09-27 ENCOUNTER — Other Ambulatory Visit: Payer: Self-pay

## 2014-09-27 MED ORDER — GLUCOSE BLOOD VI STRP: ORAL_STRIP | Status: DC

## 2014-11-16 ENCOUNTER — Other Ambulatory Visit: Payer: Self-pay | Admitting: Family Medicine

## 2014-11-19 ENCOUNTER — Other Ambulatory Visit: Payer: Self-pay | Admitting: Family Medicine

## 2014-12-05 ENCOUNTER — Other Ambulatory Visit: Payer: Self-pay | Admitting: Family Medicine

## 2014-12-10 ENCOUNTER — Encounter: Payer: Self-pay | Admitting: Family Medicine

## 2014-12-10 ENCOUNTER — Ambulatory Visit (INDEPENDENT_AMBULATORY_CARE_PROVIDER_SITE_OTHER): Payer: Medicare Other | Admitting: Family Medicine

## 2014-12-10 VITALS — BP 138/88 | Ht 64.0 in | Wt 188.0 lb

## 2014-12-10 DIAGNOSIS — I10 Essential (primary) hypertension: Secondary | ICD-10-CM | POA: Diagnosis not present

## 2014-12-10 DIAGNOSIS — E785 Hyperlipidemia, unspecified: Secondary | ICD-10-CM | POA: Diagnosis not present

## 2014-12-10 DIAGNOSIS — E119 Type 2 diabetes mellitus without complications: Secondary | ICD-10-CM

## 2014-12-10 DIAGNOSIS — G4700 Insomnia, unspecified: Secondary | ICD-10-CM | POA: Diagnosis not present

## 2014-12-10 LAB — POCT GLYCOSYLATED HEMOGLOBIN (HGB A1C): Hemoglobin A1C: 7.7

## 2014-12-10 NOTE — Progress Notes (Signed)
Subjective:    Patient ID: ERCELL PERLMAN, female    DOB: Jun 15, 1946, 69 y.o.   MRN: [id number]  Diabetes She presents for her follow-up diabetic visit. She has type 2 diabetes mellitus. Pertinent negatives for hypoglycemia include no confusion. Pertinent negatives for diabetes include no chest pain, no fatigue, no polydipsia, no polyphagia and no weakness. Diabetic complications include a CVA and retinopathy. Current diabetic treatment includes oral agent (dual therapy). She is compliant with treatment all of the time. She does not see a podiatrist.Eye exam is current.  Hyperlipidemia This is a chronic problem. The current episode started more than 1 year ago. The problem is controlled. Recent lipid tests were reviewed and are normal. Exacerbating diseases include diabetes and obesity. Pertinent negatives include no chest pain. Current antihyperlipidemic treatment includes statins. The current treatment provides significant improvement of lipids. There are no compliance problems.  Risk factors for coronary artery disease include diabetes mellitus, dyslipidemia and family history.  Hypertension This is a new problem. The current episode started more than 1 year ago. Pertinent negatives include no chest pain. Risk factors for coronary artery disease include diabetes mellitus, dyslipidemia and family history. Past treatments include ACE inhibitors, alpha 1 blockers and calcium channel blockers. The current treatment provides significant improvement. Compliance problems include exercise and diet.  Hypertensive end-organ damage includes CVA and retinopathy.  Tries to follow a diabetic diet. In [place] a dance class for seniors.   Pt states no concerns she can think of. She had a list of concerns but left them at home and can not remember what was on list.   Review of Systems  Constitutional: Negative for activity change, appetite change and fatigue.  HENT: Negative for congestion.     Respiratory: Negative for cough.   Cardiovascular: Negative for chest pain.  Gastrointestinal: Negative for abdominal pain.  Endocrine: Negative for polydipsia and polyphagia.  Neurological: Negative for weakness.  Psychiatric/Behavioral: Negative for confusion.       Objective:   Physical Exam  Constitutional: She appears well-nourished. No distress.  Cardiovascular: Normal rate, regular rhythm and normal heart sounds.   No murmur heard. Pulmonary/Chest: Effort normal and breath sounds normal. No respiratory distress.  Musculoskeletal: She exhibits no edema.  Lymphadenopathy:    She has no cervical adenopathy.  Neurological: She is alert. She exhibits normal muscle tone.  Psychiatric: Her behavior is normal.  Vitals reviewed.         Assessment & Plan:  1. Type 2 diabetes mellitus without complication Diabetes a little worse control. She has not been watching her diet and does not excise as much as she should she will work hard at trying to make changes on this then she will follow-up in several months recheck the A1c. - POCT glycosylated hemoglobin (Hb A1C) - Hemoglobin A1c  2. Insomnia Currently using Ambien 10 mg at night I encouraged her to only use 5 mg we will send in a new prescription  3. Hyperlipidemia Cholesterol profile recommended. Before next visit. Most recent one looked very good. Watch diet continue medication. - Lipid panel  4. Essential hypertension Blood pressure under good control continue current measures. Lab work before next visit watch diet minimize salt - Hepatic function panel - Basic metabolic panel  Patient does follow-up with GI specialist regarding retinopathy. I told her to best way to keep that under control is keeping blood pressure and diabetes under good control  Greater than 25 minutes spent with the patient discussing these multiple  issues

## 2014-12-20 ENCOUNTER — Other Ambulatory Visit: Payer: Self-pay | Admitting: Family Medicine

## 2014-12-26 DIAGNOSIS — E11329 Type 2 diabetes mellitus with mild nonproliferative diabetic retinopathy without macular edema: Secondary | ICD-10-CM | POA: Diagnosis not present

## 2014-12-26 DIAGNOSIS — H25813 Combined forms of age-related cataract, bilateral: Secondary | ICD-10-CM | POA: Diagnosis not present

## 2014-12-26 DIAGNOSIS — H34832 Tributary (branch) retinal vein occlusion, left eye: Secondary | ICD-10-CM | POA: Diagnosis not present

## 2014-12-26 DIAGNOSIS — H3581 Retinal edema: Secondary | ICD-10-CM | POA: Diagnosis not present

## 2014-12-26 DIAGNOSIS — H40003 Preglaucoma, unspecified, bilateral: Secondary | ICD-10-CM | POA: Diagnosis not present

## 2014-12-31 DIAGNOSIS — E119 Type 2 diabetes mellitus without complications: Secondary | ICD-10-CM | POA: Diagnosis not present

## 2014-12-31 DIAGNOSIS — H2513 Age-related nuclear cataract, bilateral: Secondary | ICD-10-CM | POA: Diagnosis not present

## 2014-12-31 LAB — HM DIABETES EYE EXAM

## 2015-01-02 ENCOUNTER — Encounter: Payer: Self-pay | Admitting: *Deleted

## 2015-01-22 ENCOUNTER — Other Ambulatory Visit: Payer: Self-pay | Admitting: Family Medicine

## 2015-01-30 ENCOUNTER — Other Ambulatory Visit: Payer: Self-pay | Admitting: Family Medicine

## 2015-01-30 DIAGNOSIS — Z1231 Encounter for screening mammogram for malignant neoplasm of breast: Secondary | ICD-10-CM

## 2015-02-09 ENCOUNTER — Other Ambulatory Visit: Payer: Self-pay | Admitting: Family Medicine

## 2015-02-10 ENCOUNTER — Encounter: Payer: Self-pay | Admitting: Family Medicine

## 2015-02-10 ENCOUNTER — Ambulatory Visit (INDEPENDENT_AMBULATORY_CARE_PROVIDER_SITE_OTHER): Payer: Medicare Other | Admitting: Family Medicine

## 2015-02-10 ENCOUNTER — Ambulatory Visit: Payer: Medicare Other | Admitting: Family Medicine

## 2015-02-10 VITALS — BP 118/80 | Ht 64.0 in | Wt 186.0 lb

## 2015-02-10 DIAGNOSIS — M129 Arthropathy, unspecified: Secondary | ICD-10-CM

## 2015-02-10 DIAGNOSIS — M1711 Unilateral primary osteoarthritis, right knee: Secondary | ICD-10-CM

## 2015-02-10 MED ORDER — TRAMADOL HCL 50 MG PO TABS: 50.0000 mg | ORAL_TABLET | Freq: Four times a day (QID) | ORAL | Status: DC | PRN

## 2015-02-10 NOTE — Progress Notes (Signed)
   Subjective:    Patient ID: KEORA ECCLESTON, female    DOB: [birth year], 69 y.o.   MRN: [id number]  HPIRight knee pain and swelling. Started 3 days ago.   Hx of right patella dislocation  Right knee has swollen and hx of needing to get knees tapped  Someswelling right knee and some and ankle  Left knee now hurting and starting to swell slightly Left knee is also hurting. Pt thinks where she has been putting weight on the left side.    Testing Review of Systems No hip pain no chest pain no back pain    Objective:   Physical Exam  Alert no acute distress vital stable HEENT normal. Lungs clear heart rare rhythm right knee substantial crepitations mild effusion. Trace edema right ankle. 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 Patient declined. No anti-inflammatory oral due to stroke history. No prednisone due to diabetes. Ultram when necessary. Explant slow resolution WSL

## 2015-02-12 ENCOUNTER — Ambulatory Visit (HOSPITAL_COMMUNITY): Payer: Medicare Other

## 2015-02-14 ENCOUNTER — Ambulatory Visit (HOSPITAL_COMMUNITY): Payer: Medicare Other

## 2015-02-14 ENCOUNTER — Ambulatory Visit (HOSPITAL_COMMUNITY)
Admission: RE | Admit: 2015-02-14 | Discharge: 2015-02-14 | Disposition: A | Discharge status: Home / Self Care | Payer: Medicare Other | Source: Ambulatory Visit | Attending: Family Medicine | Admitting: Family Medicine

## 2015-02-14 DIAGNOSIS — Z1231 Encounter for screening mammogram for malignant neoplasm of breast: Secondary | ICD-10-CM | POA: Diagnosis not present

## 2015-02-28 ENCOUNTER — Other Ambulatory Visit: Payer: Self-pay | Admitting: Nurse Practitioner

## 2015-03-14 DIAGNOSIS — E119 Type 2 diabetes mellitus without complications: Secondary | ICD-10-CM | POA: Diagnosis not present

## 2015-03-14 DIAGNOSIS — I10 Essential (primary) hypertension: Secondary | ICD-10-CM | POA: Diagnosis not present

## 2015-03-14 DIAGNOSIS — E785 Hyperlipidemia, unspecified: Secondary | ICD-10-CM | POA: Diagnosis not present

## 2015-03-15 LAB — BASIC METABOLIC PANEL
BUN/Creatinine Ratio: 17 (ref 11–26)
BUN: 17 mg/dL (ref 8–27)
CO2: 23 mmol/L (ref 18–29)
CREATININE: 0.99 mg/dL (ref 0.57–1.00)
Calcium: 9.2 mg/dL (ref 8.7–10.3)
Chloride: 103 mmol/L (ref 97–108)
GFR calc Af Amer: 67 mL/min/{1.73_m2} (ref >59)
GFR calc non Af Amer: 58 mL/min/{1.73_m2} — ABNORMAL LOW (ref >59)
Glucose: 83 mg/dL (ref 65–99)
Potassium: 3.5 mmol/L (ref 3.5–5.2)
SODIUM: 143 mmol/L (ref 134–144)

## 2015-03-15 LAB — HEPATIC FUNCTION PANEL
ALT: 16 IU/L (ref 0–32)
AST: 17 IU/L (ref 0–40)
Albumin: 4 g/dL (ref 3.6–4.8)
Alkaline Phosphatase: 55 IU/L (ref 39–117)
Bilirubin Total: 0.4 mg/dL (ref 0.0–1.2)
Bilirubin, Direct: 0.12 mg/dL (ref 0.00–0.40)
Total Protein: 6.4 g/dL (ref 6.0–8.5)

## 2015-03-15 LAB — LIPID PANEL
CHOLESTEROL TOTAL: 128 mg/dL (ref 100–199)
Chol/HDL Ratio: 3.7 ratio units (ref 0.0–4.4)
HDL: 35 mg/dL — AB (ref >39)
LDL Calculated: 72 mg/dL (ref 0–99)
Triglycerides: 104 mg/dL (ref 0–149)
VLDL CHOLESTEROL CAL: 21 mg/dL (ref 5–40)

## 2015-03-15 LAB — HEMOGLOBIN A1C
ESTIMATED AVERAGE GLUCOSE: 151 mg/dL
HEMOGLOBIN A1C: 6.9 % — AB (ref 4.8–5.6)

## 2015-03-18 ENCOUNTER — Ambulatory Visit (INDEPENDENT_AMBULATORY_CARE_PROVIDER_SITE_OTHER): Payer: Medicare Other | Admitting: Family Medicine

## 2015-03-18 ENCOUNTER — Encounter: Payer: Self-pay | Admitting: Family Medicine

## 2015-03-18 VITALS — BP 138/72 | Ht 64.0 in | Wt 186.1 lb

## 2015-03-18 DIAGNOSIS — E119 Type 2 diabetes mellitus without complications: Secondary | ICD-10-CM | POA: Diagnosis not present

## 2015-03-18 DIAGNOSIS — B9689 Other specified bacterial agents as the cause of diseases classified elsewhere: Secondary | ICD-10-CM

## 2015-03-18 DIAGNOSIS — R300 Dysuria: Secondary | ICD-10-CM

## 2015-03-18 DIAGNOSIS — J019 Acute sinusitis, unspecified: Secondary | ICD-10-CM

## 2015-03-18 DIAGNOSIS — E785 Hyperlipidemia, unspecified: Secondary | ICD-10-CM

## 2015-03-18 DIAGNOSIS — G4700 Insomnia, unspecified: Secondary | ICD-10-CM | POA: Diagnosis not present

## 2015-03-18 DIAGNOSIS — I10 Essential (primary) hypertension: Secondary | ICD-10-CM

## 2015-03-18 LAB — POCT URINALYSIS DIPSTICK
Spec Grav, UA: 1.015
pH, UA: 5

## 2015-03-18 MED ORDER — AMOXICILLIN 500 MG PO CAPS: 500.0000 mg | ORAL_CAPSULE | Freq: Three times a day (TID) | ORAL | Status: DC

## 2015-03-18 MED ORDER — ZOLPIDEM TARTRATE 5 MG PO TABS: 5.0000 mg | ORAL_TABLET | Freq: Every evening | ORAL | Status: DC | PRN

## 2015-03-18 NOTE — Progress Notes (Signed)
Subjective:    Patient ID: Kara Kirby, female    DOB: 06-21-1946, 69 y.o.   MRN: [id number]  Diabetes She presents for her follow-up diabetic visit. She has type 2 diabetes mellitus. Hypoglycemia symptoms include headaches. There are no diabetic associated symptoms. Pertinent negatives for diabetes include no chest pain. There are no hypoglycemic complications. Diabetic complications include a CVA. There are no known risk factors for coronary artery disease. Current diabetic treatment includes oral agent (dual therapy). She is compliant with treatment all of the time.  Sinusitis This is a recurrent problem. The current episode started in the past 7 days. The problem has been gradually worsening since onset. There has been no fever. Her pain is at a severity of 7/10. The pain is moderate. Associated symptoms include congestion, coughing, headaches, a hoarse voice, sinus pressure and sneezing. Pertinent negatives include no chills or sore throat. Past treatments include acetaminophen. The treatment provided mild relief.  Hyperlipidemia This is a recurrent problem. The current episode started more than 1 year ago. The problem is controlled. Recent lipid tests were reviewed and are normal. She has no history of chronic renal disease. Pertinent negatives include no chest pain, focal sensory loss or leg pain. Current antihyperlipidemic treatment includes exercise. The current treatment provides moderate improvement of lipids. There are no compliance problems.  Risk factors for coronary artery disease include diabetes mellitus, dyslipidemia and hypertension.  Hypertension This is a chronic problem. The current episode started more than 1 year ago. The problem has been gradually improving since onset. The problem is controlled. Associated symptoms include headaches. Pertinent negatives include no chest pain. Risk factors for coronary artery disease include diabetes mellitus and dyslipidemia. Past  treatments include alpha 1 blockers. The current treatment provides mild improvement. Hypertensive end-organ damage includes CVA. There is no history of angina. There is no history of chronic renal disease.  Knee Pain  The incident occurred more than 1 week ago. There was no injury mechanism. The pain is present in the right knee and left knee. The quality of the pain is described as aching. The pain is moderate. The pain has been constant since onset.  Dysuria  This is a new problem. The current episode started in the past 7 days. The problem occurs intermittently. The problem has been gradually worsening. The pain is at a severity of 3/10. The pain is mild. She is not sexually active. There is no history of pyelonephritis. Associated symptoms include flank pain and frequency. Pertinent negatives include no chills, discharge, hematuria or nausea. She has tried nothing for the symptoms. The treatment provided no relief.   Patient has blood work done and results are in the system.   Patient also has sinus congestion that has been present for about 1 week now. Symptoms are gradually worsening.   Patient has some questions about her vitamins.  Patient states she has been having trouble with bilateral knee pain. Has questions about cortisone injections also.   Patient states that her breath has a bad odor to it. She brush and floss daily.  This has present for a long time now.   Review of Systems  Constitutional: Negative for chills.  HENT: Positive for congestion, hoarse voice, sinus pressure and sneezing. Negative for sore throat.   Respiratory: Positive for cough.   Cardiovascular: Negative for chest pain.  Gastrointestinal: Negative for nausea.  Genitourinary: Positive for dysuria, frequency and flank pain. Negative for hematuria.  Neurological: Positive for headaches.  Objective:   Physical Exam  Constitutional: She appears well-developed and well-nourished. No distress.  HENT:    Head: Normocephalic.  Nose: Nose normal.  Mouth/Throat: Oropharynx is clear and moist. No oropharyngeal exudate.  Neck: Neck supple.  Cardiovascular: Normal rate, regular rhythm and normal heart sounds.   No murmur heard. Pulmonary/Chest: Effort normal and breath sounds normal. No respiratory distress. She has no wheezes.  Musculoskeletal: She exhibits no edema.  Lymphadenopathy:    She has no cervical adenopathy.  Neurological: She is alert. She exhibits normal muscle tone.  Skin: Skin is warm and dry.  Psychiatric: Her behavior is normal.  Nursing note and vitals reviewed.         Assessment & Plan:  Dysuria - Plan: POCT urinalysis dipstick  Type 2 diabetes mellitus without complication  Insomnia  Hyperlipidemia  Essential hypertension  Acute bacterial rhinosinusitis   significant amount of time was spent with this patient her diabetes under decent control she is to continue her current measures we are reducing her glipizide just a little bit because of some low sugar spells  she does not have a urinary tract infection no antibodies warranted for that Insomnia she does well with her medication reduce Ambien to 5 mg  blood pressure under decent control continue current measures Hyperlipidemia under good control continue current measures  sinusitis antibodies prescribed warning signs discuss  significant time spent with patient with multiple health issues and multiple discussions patient very good at taking good care of herself an extremely nice 35 minutes spent with patient

## 2015-04-07 ENCOUNTER — Other Ambulatory Visit: Payer: Self-pay | Admitting: Family Medicine

## 2015-04-14 ENCOUNTER — Other Ambulatory Visit: Payer: Self-pay | Admitting: Family Medicine

## 2015-04-18 ENCOUNTER — Other Ambulatory Visit: Payer: Self-pay | Admitting: Family Medicine

## 2015-04-18 MED ORDER — LISINOPRIL 20 MG PO TABS: 40.0000 mg | ORAL_TABLET | Freq: Every day | ORAL | Status: DC

## 2015-04-18 NOTE — Telephone Encounter (Signed)
Patient needs  New prescription for lisinopril 20 mg two pills a day on prescription. She states completely out due to last prescription had one pill a day where she takes two. Call into [facility].

## 2015-04-18 NOTE — Telephone Encounter (Signed)
Rx sent electronically to pharmacy. Patient notified. 

## 2015-04-26 ENCOUNTER — Other Ambulatory Visit: Payer: Self-pay | Admitting: Family Medicine

## 2015-04-28 ENCOUNTER — Telehealth: Payer: Self-pay | Admitting: Family Medicine

## 2015-04-29 NOTE — Telephone Encounter (Signed)
Error

## 2015-05-07 ENCOUNTER — Other Ambulatory Visit: Payer: Self-pay | Admitting: Family Medicine

## 2015-05-14 ENCOUNTER — Other Ambulatory Visit: Payer: Self-pay | Admitting: Family Medicine

## 2015-05-27 ENCOUNTER — Encounter: Payer: Self-pay | Admitting: Family

## 2015-05-28 ENCOUNTER — Ambulatory Visit (INDEPENDENT_AMBULATORY_CARE_PROVIDER_SITE_OTHER): Payer: Medicare Other | Admitting: Family

## 2015-05-28 ENCOUNTER — Ambulatory Visit (HOSPITAL_COMMUNITY)
Admission: RE | Admit: 2015-05-28 | Discharge: 2015-05-28 | Disposition: A | Discharge status: Home / Self Care | Payer: Medicare Other | Source: Ambulatory Visit | Attending: Family | Admitting: Family

## 2015-05-28 ENCOUNTER — Encounter: Payer: Self-pay | Admitting: Family

## 2015-05-28 VITALS — BP 163/86 | HR 69 | Temp 97.5°F | Ht 64.0 in | Wt 185.0 lb

## 2015-05-28 DIAGNOSIS — Z48812 Encounter for surgical aftercare following surgery on the circulatory system: Secondary | ICD-10-CM | POA: Insufficient documentation

## 2015-05-28 DIAGNOSIS — I6523 Occlusion and stenosis of bilateral carotid arteries: Secondary | ICD-10-CM | POA: Insufficient documentation

## 2015-05-28 DIAGNOSIS — Z9889 Other specified postprocedural states: Secondary | ICD-10-CM

## 2015-05-28 IMAGING — MG MM DIGITAL SCREENING
4 series · 4 of 4 positions shown · non-contrast
Comparison: Previous exam(s)

ACR Breast Density Category a: The breast tissue is almost entirely
fatty.

CLINICAL DATA: Screening.

EXAM:
DIGITAL SCREENING BILATERAL MAMMOGRAM WITH CAD

[L CC]
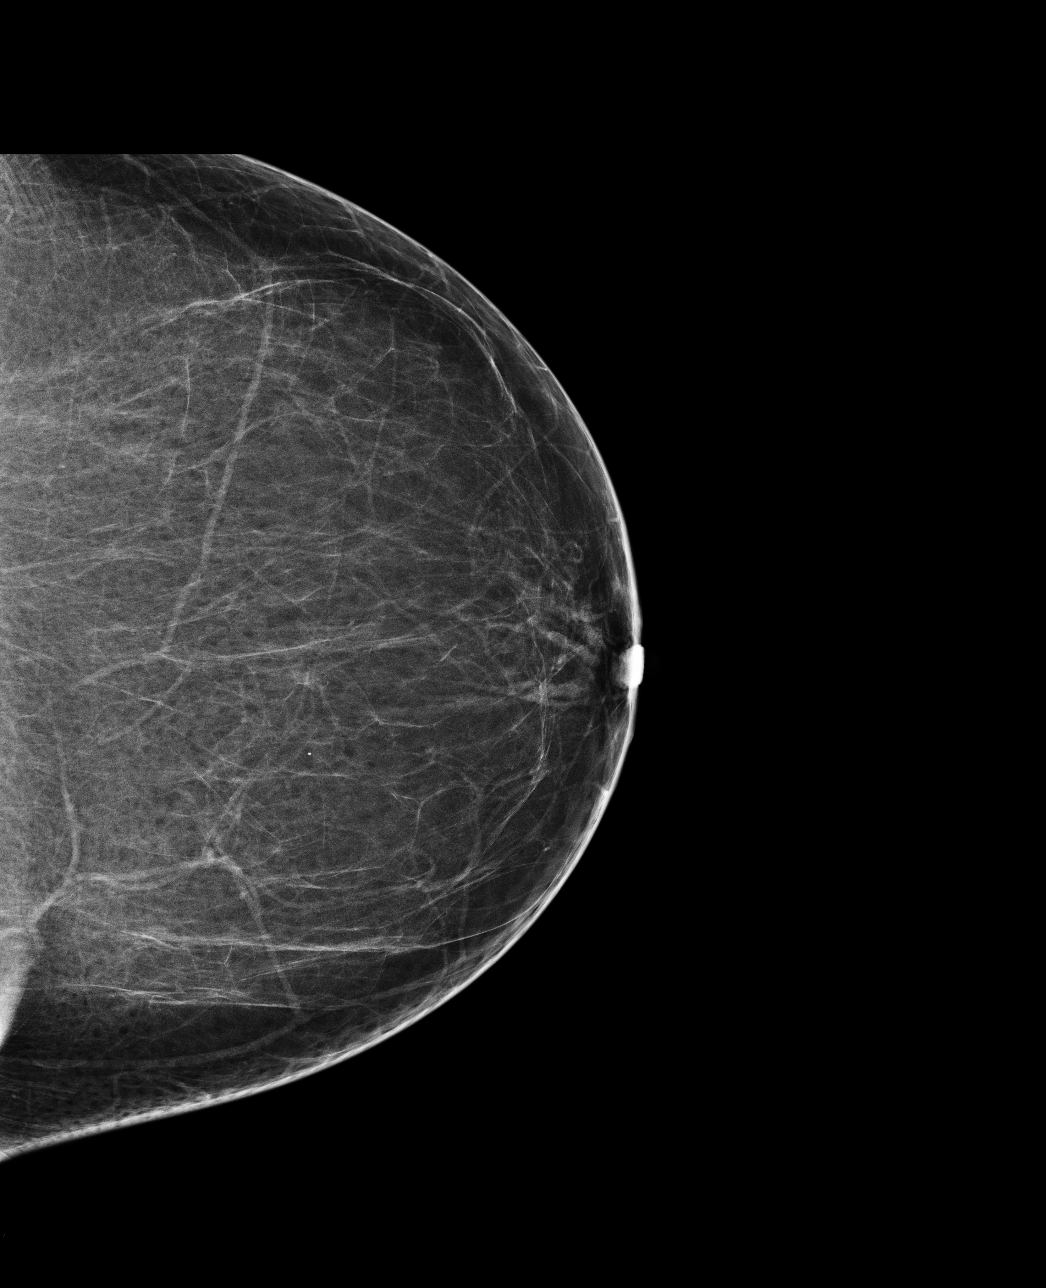

[L MLO]
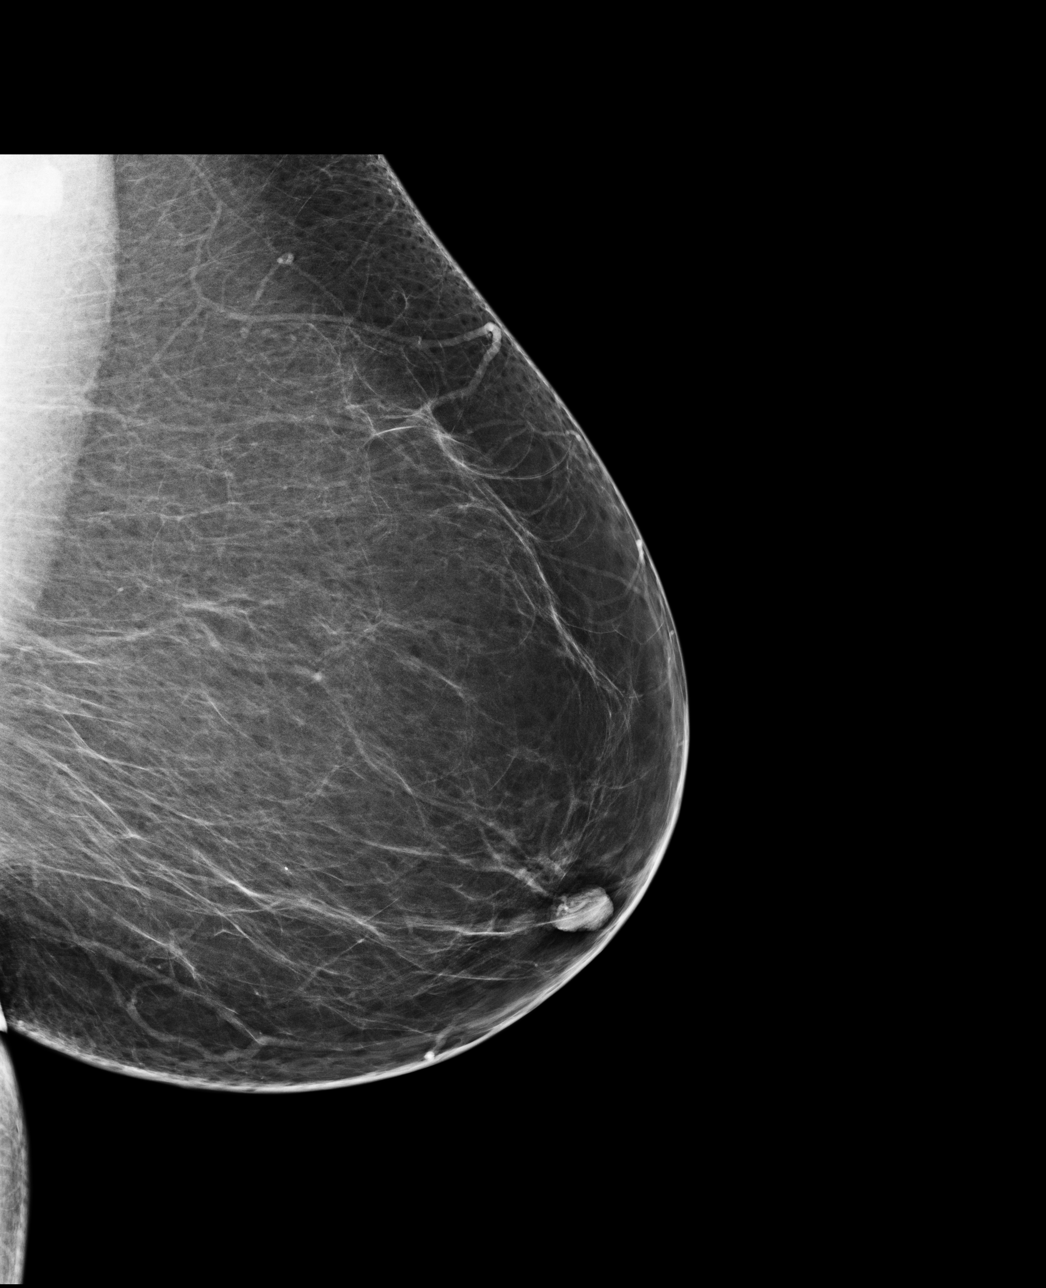

[R CC]
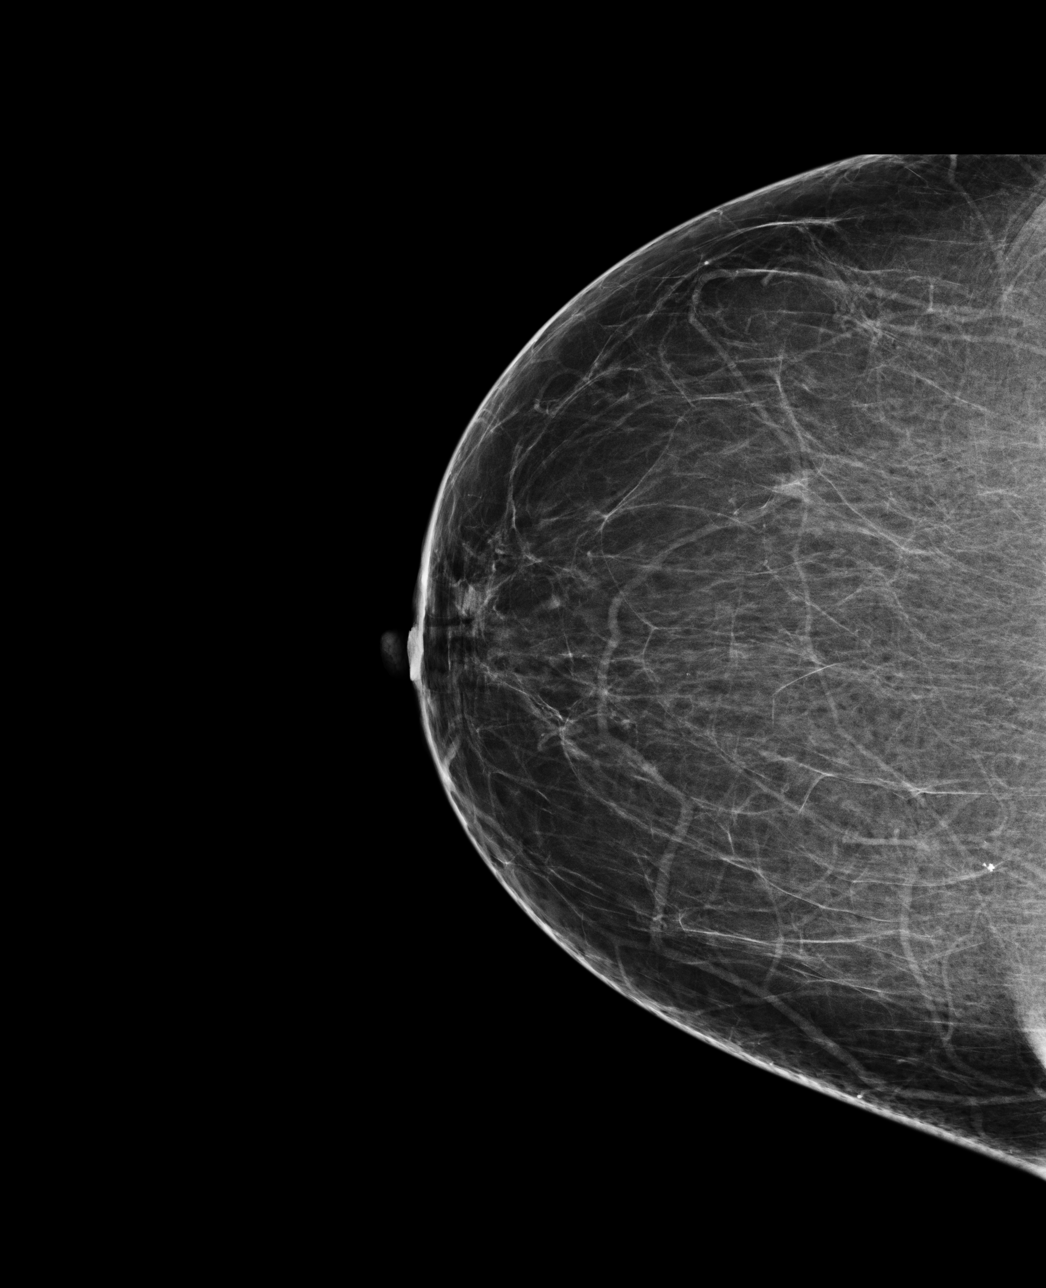

[R MLO]
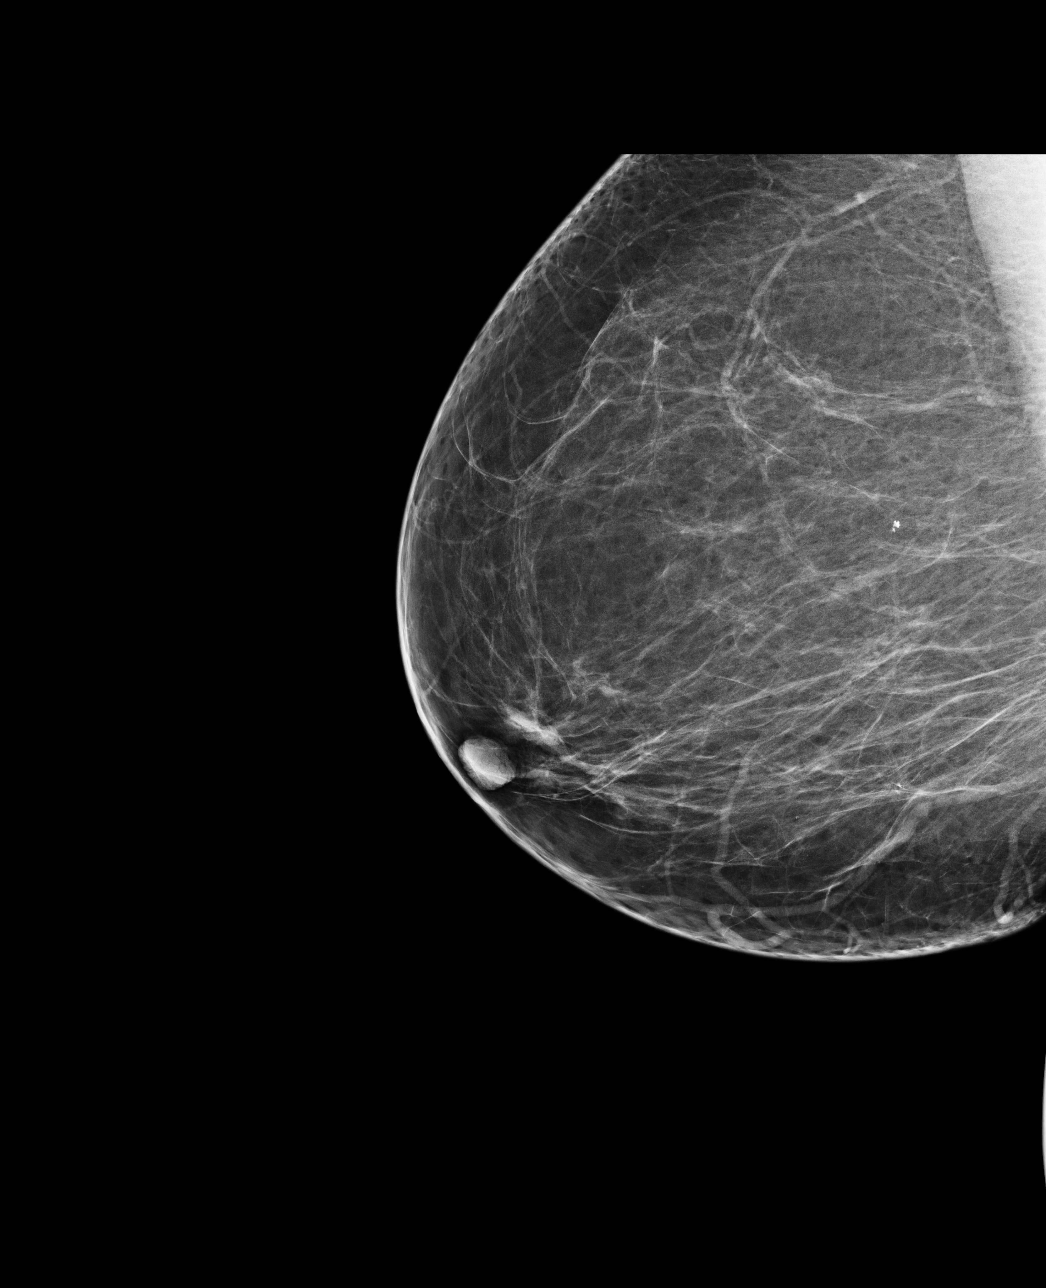

[4 of 4 positions shown; findings below may reference images not displayed]

FINDINGS: There are no findings suspicious for malignancy. Images were
processed with CAD.
IMPRESSION: No mammographic evidence of malignancy. A result letter of this
screening mammogram will be mailed directly to the patient.

RECOMMENDATION:
Screening mammogram in one year. (Code:JI-[id number])

BI-RADS CATEGORY  1: Negative

## 2015-05-28 NOTE — Progress Notes (Signed)
Established Carotid Patient   History of Present Illness  Kara Kirby is a 69 y.o. female patient of Dr. Scot Dock who is s/p right carotid endarterectomy in 2004. She returns today for follow up.  Patient has a history of a TIA in 2004 pre-operative to the right CEA as manifested by slurred speech, left leg weakness, and twisting of her face; denies monocular loss of vision. Her left leg feels a little week occasionally, otherwise no residual neurological deficits.  She states that she also had a TIA in 2014, as manifested by left leg weakness that lasted for about a week, denies slurred speech. Pt denies any further stroke or TIA symptoms since then, saw a neurologist. Pt denies claudication symptoms with walking. Pt denies new medical problems or surgeries since last visit.  Pt does not recall any cardiac stress testing, denies any known cardiac problems.  Pt Diabetic: Yes, states her A1C was 6.3, in May, 2015 Pt smoker: non-smoker  Pt meds include: Statin : Yes ASA: Yes Other anticoagulants/antiplatelets: no, took Plavix for a few months after her last TIA, pt states it was prescribed by her neurologist    Past Medical History  Diagnosis Date  . DM (diabetes mellitus)   . HTN (hypertension)   . Sleep related leg cramps   . Hyperlipidemia   . GERD (gastroesophageal reflux disease)     intermittent Sx  . Allergy   . Hematuria   . Renal insufficiency, mild   . Hip arthritis 2003    Left  . Dyslipidemia   . CVA (cerebral vascular accident) 02-19-13  . Arthritis     Left shoulder  . Carotid artery occlusion     Social History Social History  Substance Use Topics  . Smoking status: Never Smoker   . Smokeless tobacco: Never Used  . Alcohol Use: No    Family History Family History  Problem Relation Age of Onset  . Colon cancer Neg Hx   . Colon polyps Neg Hx   . Hypertension Mother   . Heart attack Mother   . Hypertension Sister   . Diabetes Sister    Amputation  . Hyperlipidemia Sister   . Heart attack Sister   . Diabetes Sister   . Hypertension Sister   . Heart disease Sister     Before age 59    Surgical History Past Surgical History  Procedure Laterality Date  . S/p hysterectomy    . Colonoscopy    . Total abdominal hysterectomy      FIBROID, HYPERMENORRHEA  . Colonoscopy  10/19/2011    Procedure: COLONOSCOPY;  Surgeon: Dorothyann Peng, MD;  Location: [facility];  Service: Endoscopy;  Laterality: N/A;  10:15  . Carotid endarterectomy  RIGHT    Allergies  Allergen Reactions  . Other Hives    IV DYE     Current Outpatient Prescriptions  Medication Sig Dispense Refill  . amLODipine (NORVASC) 10 MG tablet Take 1 tablet (10 mg total) by mouth daily. 30 tablet 12  . amoxicillin (AMOXIL) 500 MG capsule TAKE ONE CAPSULE BY MOUTH THREE TIMES DAILY FOR 10 DAYS 30 capsule 0  . aspirin 325 MG tablet Take 1 tablet (325 mg total) by mouth daily.    Marland Kitchen atorvastatin (LIPITOR) 80 MG tablet TAKE ONE TABLET BY MOUTH ONCE DAILY 30 tablet 5  . chlorthalidone (HYGROTON) 25 MG tablet TAKE ONE TABLET BY MOUTH ONCE DAILY **STOP HCTZ** 30 tablet 5  . cyclobenzaprine (FLEXERIL) 10 MG tablet Take  one every 8 hours prn spasms. Do not use while driving. Caution drowsiness. 30 tablet 0  . doxazosin (CARDURA) 4 MG tablet TAKE ONE TABLET BY MOUTH AT BEDTIME 30 tablet 5  . fluconazole (DIFLUCAN) 150 MG tablet TAKE ONE TABLET BY MOUTH AS A ONE-TIME DOSE 1 tablet 0  . fluticasone (FLONASE) 50 MCG/ACT nasal spray USE TWO SPRAY IN EACH NOSTRIL EVERY DAY 16 g 5  . glipiZIDE (GLUCOTROL) 5 MG tablet TAKE ONE TABLET BY MOUTH TWICE DAILY 60 tablet 5  . glucose blood (ONE TOUCH ULTRA TEST) test strip ONE STRIP TO TEST BLOOD SUGAR ONCE DAILY 50 each 5  . KLOR-CON M10 10 MEQ tablet TAKE TWO TABLETS BY MOUTH ONCE DAILY 180 tablet 1  . lisinopril (PRINIVIL,ZESTRIL) 20 MG tablet Take 2 tablets (40 mg total) by mouth daily. 180 tablet 0  . metFORMIN (GLUCOPHAGE) 500  MG tablet TAKE ONE TABLET BY MOUTH TWICE DAILY WITH A MEAL 180 tablet 1  . NITROSTAT 0.4 MG SL tablet DISSOLVE ONE TABLET UNDER THE TONGUE EVERY 5 MINUTES AS NEEDED FOR CHEST PAIN.  DO NOT EXCEED A TOTAL OF 3 DOSES IN 15 MINUTES 25 tablet 5  . potassium chloride (KLOR-CON M10) 10 MEQ tablet TAKE TWO TABLETS BY MOUTH EVERY DAY 180 tablet 1  . ranitidine (ZANTAC) 300 MG tablet TAKE ONE TABLET BY MOUTH ONCE DAILY 30 tablet 5  . traMADol (ULTRAM) 50 MG tablet Take 1 tablet (50 mg total) by mouth every 6 (six) hours as needed. Drowsy precautions 24 tablet 0  . VENTOLIN HFA 108 (90 BASE) MCG/ACT inhaler INHALE TWO PUFFS BY MOUTH EVERY 4 HOURS AS NEEDED 18 each 2  . zolpidem (AMBIEN) 5 MG tablet Take 1 tablet (5 mg total) by mouth at bedtime as needed for sleep. 30 tablet 5   No current facility-administered medications for this visit.    Review of Systems : See HPI for pertinent positives and negatives.  Physical Examination  Filed Vitals:   05/28/15 1148 05/28/15 1152  BP: 180/91 163/86  Pulse: 69   Temp: 97.5 F (36.4 C)   TempSrc: Oral   Height: 5\' 4"  (1.626 m)   Weight: 185 lb (83.915 kg)   SpO2: 97%    Body mass index is 31.74 kg/(m^2).  General: WDWN female in NAD GAIT: normal Eyes: PERRLA Pulmonary: Non-labored, CTAB, Negative Rales, Negative rhonchi, & Negative wheezing.  Cardiac: regular Rhythm , Positive detected murmur.  VASCULAR EXAM Carotid Bruits Right Left   Negative Negative   Aorta is not palpable. Radial pulses are 2+ palpable and equal.      Gastrointestinal: soft, nontender, BS WNL, no r/g, negative masses.  Musculoskeletal: Negative muscle atrophy/wasting. M/S 5/5 throughout, Extremities without ischemic changes.  Neurologic: A&O X 3; Appropriate Affect, Speech is normal CN 2-12 intact, Pain and light touch intact in  extremities, Motor exam as listed above.         05/22/14 carotid Duplex:  Patent right carotid endarterectomy site with no evidence of hyperplasia or restenosis.  Left internal carotid artery velocities suggest a <40% stenosis; however, velocities may be underestimated due to calcific plaque with acoustic shadowing.     Compared to the previous exam:  No significant change in comparison to the last exam on 05/23/2013.           Non-Invasive Vascular Imaging CAROTID DUPLEX 05/28/2015   CEREBROVASCULAR DUPLEX EVALUATION    INDICATION: Carotid artery disease    PREVIOUS INTERVENTION(S): Right carotid endarterectomy 2004  DUPLEX EXAM: Carotid duplex    RIGHT  LEFT  Peak Systolic Velocities (cm/s) End Diastolic Velocities (cm/s) Plaque LOCATION Peak Systolic Velocities (cm/s) End Diastolic Velocities (cm/s) Plaque  68 12 - CCA PROXIMAL 91 13 -  64 13 - CCA MID 79 18 HT  66 14 HT CCA DISTAL 79 13 -  383 38 HT ECA 100 12 -  422 110 HM ICA PROXIMAL 138 39 CP  78 11 - ICA MID 119 22 -  91 20 - ICA DISTAL 110 24 -    N/A ICA / CCA Ratio (PSV) 1.7  Antegrade Vertebral Flow Antegrade  248 Brachial Systolic Pressure (mmHg) 250  Triphasic Brachial Artery Waveforms Triphasic    Plaque Morphology:  HM = Homogeneous, HT = Heterogeneous, CP = Calcific Plaque, SP = Smooth Plaque, IP = Irregular Plaque  ADDITIONAL FINDINGS:     IMPRESSION: 1. 60 - 79% upper end of range restenosis of the right internal carotid artery with significant plaque. 2. 1 - 39% left internal carotid artery stenosis, higher velocity may be obscured due to calcific shadowing.    Compared to the previous exam:  Increased velocity from exam of 05/22/2014      Assessment: YULITZA SHORTS is a 69 y.o. female who is s/p right carotid endarterectomy in 2004. She had a TIA preoperative to the CEA, and another TIA in 2014, none subsequently. Today's carotid Duplex suggests 60 - 79% upper end of range  restenosis of the right internal carotid artery with significant plaque and 1 - 39% left internal carotid artery stenosis, higher velocity may be obscured due to calcific shadowing.  Fortunately her DM is in good control and she has never used tobacco. She is on a statin, ASA, and Plavix.  I discussed with Dr. Scot Dock pt HPI and results of today's carotid Duplex.  Plan: Follow-up in 3 months with Carotid Duplex and see Dr. Scot Dock    I discussed in depth with the patient the nature of atherosclerosis, and emphasized the importance of maximal medical management including strict control of blood pressure, blood glucose, and lipid levels, obtaining regular exercise, and continued cessation of smoking.  The patient is aware that without maximal medical management the underlying atherosclerotic disease process will progress, limiting the benefit of any interventions. The patient was given information about stroke prevention and what symptoms should prompt the patient to seek immediate medical care. Thank you for allowing [place] to participate in this patient's care.  Kara Chambers, RN, MSN, FNP-C [facility] Office: [phone]  Clinic Physician: Scot Dock  05/28/2015 12:11 PM

## 2015-05-28 NOTE — Patient Instructions (Signed)
Stroke Prevention Some medical conditions and behaviors are associated with an increased chance of having a stroke. You may prevent a stroke by making healthy choices and managing medical conditions. HOW CAN I REDUCE MY RISK OF HAVING A STROKE?   Stay physically active. Get at least 30 minutes of activity on most or all days.  Do not smoke. It may also be helpful to avoid exposure to secondhand smoke.  Limit alcohol use. Moderate alcohol use is considered to be:  No more than 2 drinks per day for men.  No more than 1 drink per day for nonpregnant women.  Eat healthy foods. This involves:  Eating 5 or more servings of fruits and vegetables a day.  Making dietary changes that address high blood pressure (hypertension), high cholesterol, diabetes, or obesity.  Manage your cholesterol levels.  Making food choices that are high in fiber and low in saturated fat, trans fat, and cholesterol may control cholesterol levels.  Take any prescribed medicines to control cholesterol as directed by your health care provider.  Manage your diabetes.  Controlling your carbohydrate and sugar intake is recommended to manage diabetes.  Take any prescribed medicines to control diabetes as directed by your health care provider.  Control your hypertension.  Making food choices that are low in salt (sodium), saturated fat, trans fat, and cholesterol is recommended to manage hypertension.  Take any prescribed medicines to control hypertension as directed by your health care provider.  Maintain a healthy weight.  Reducing calorie intake and making food choices that are low in sodium, saturated fat, trans fat, and cholesterol are recommended to manage weight.  Stop drug abuse.  Avoid taking birth control pills.  Talk to your health care provider about the risks of taking birth control pills if you are over 35 years old, smoke, get migraines, or have ever had a blood clot.  Get evaluated for sleep  disorders (sleep apnea).  Talk to your health care provider about getting a sleep evaluation if you snore a lot or have excessive sleepiness.  Take medicines only as directed by your health care provider.  For some people, aspirin or blood thinners (anticoagulants) are helpful in reducing the risk of forming abnormal blood clots that can lead to stroke. If you have the irregular heart rhythm of atrial fibrillation, you should be on a blood thinner unless there is a good reason you cannot take them.  Understand all your medicine instructions.  Make sure that other conditions (such as anemia or atherosclerosis) are addressed. SEEK IMMEDIATE MEDICAL CARE IF:   You have sudden weakness or numbness of the face, arm, or leg, especially on one side of the body.  Your face or eyelid droops to one side.  You have sudden confusion.  You have trouble speaking (aphasia) or understanding.  You have sudden trouble seeing in one or both eyes.  You have sudden trouble walking.  You have dizziness.  You have a loss of balance or coordination.  You have a sudden, severe headache with no known cause.  You have new chest pain or an irregular heartbeat. Any of these symptoms may represent a serious problem that is an emergency. Do not wait to see if the symptoms will go away. Get medical help at once. Call your local emergency services (911 in [place] Do not drive yourself to the hospital. Document Released: 11/04/2004 Document Revised: 02/11/2014 Document Reviewed: 03/30/2013 [employer] Patient Information 2015 [employer]. This information is not intended to replace advice given   to you by your health care provider. Make sure you discuss any questions you have with your health care provider.  

## 2015-06-09 ENCOUNTER — Telehealth: Payer: Self-pay | Admitting: Family Medicine

## 2015-06-09 NOTE — Telephone Encounter (Signed)
Pt is use to taking oscal over the counter chewable calcium supplement and is no longer able to find it in any stores here to [employer]. Pt is wanting to know what else you would recommend her to take.

## 2015-06-10 NOTE — Telephone Encounter (Signed)
Left message to return call 

## 2015-06-10 NOTE — Telephone Encounter (Signed)
I do not have any suggestions. It is a good idea to get at least 600 mg of calcium in. I would recommend she go to her regular pharmacy like [facility] and see if they carry anything she can take

## 2015-06-11 NOTE — Telephone Encounter (Signed)
Left message to return call 

## 2015-06-12 NOTE — Telephone Encounter (Signed)
[facility] 06/12/15

## 2015-06-12 NOTE — Telephone Encounter (Signed)
Spoke with patient and informed her per Dr.Scott-Dr.SCott does not have any suggestions. It is a good idea to get at least 600 mg of calcium in. Dr.Scott would recommend she go to her regular pharmacy like [facility] and see if they carry anything she can take. Patient verbalized understanding.

## 2015-06-27 ENCOUNTER — Other Ambulatory Visit: Payer: Self-pay | Admitting: Family Medicine

## 2015-06-27 ENCOUNTER — Telehealth: Payer: Self-pay | Admitting: Family Medicine

## 2015-06-27 NOTE — Telephone Encounter (Signed)
Pt is needing refills on atorvastatin and chlorthalidone. Pt has an appt on 10/6  [facility] [place]

## 2015-06-27 NOTE — Telephone Encounter (Signed)
Meds already refilled via rx refills from pharmacy

## 2015-07-03 DIAGNOSIS — H3581 Retinal edema: Secondary | ICD-10-CM | POA: Diagnosis not present

## 2015-07-03 DIAGNOSIS — H40003 Preglaucoma, unspecified, bilateral: Secondary | ICD-10-CM | POA: Diagnosis not present

## 2015-07-03 DIAGNOSIS — H25813 Combined forms of age-related cataract, bilateral: Secondary | ICD-10-CM | POA: Diagnosis not present

## 2015-07-03 DIAGNOSIS — H34832 Tributary (branch) retinal vein occlusion, left eye: Secondary | ICD-10-CM | POA: Diagnosis not present

## 2015-07-03 DIAGNOSIS — E11329 Type 2 diabetes mellitus with mild nonproliferative diabetic retinopathy without macular edema: Secondary | ICD-10-CM | POA: Diagnosis not present

## 2015-07-10 ENCOUNTER — Other Ambulatory Visit: Payer: Self-pay | Admitting: Family Medicine

## 2015-07-17 ENCOUNTER — Ambulatory Visit (INDEPENDENT_AMBULATORY_CARE_PROVIDER_SITE_OTHER): Payer: Medicare Other | Admitting: Family Medicine

## 2015-07-17 ENCOUNTER — Encounter: Payer: Self-pay | Admitting: Family Medicine

## 2015-07-17 VITALS — BP 136/68 | Ht 64.0 in | Wt 184.6 lb

## 2015-07-17 DIAGNOSIS — J3489 Other specified disorders of nose and nasal sinuses: Secondary | ICD-10-CM | POA: Diagnosis not present

## 2015-07-17 DIAGNOSIS — J301 Allergic rhinitis due to pollen: Secondary | ICD-10-CM | POA: Diagnosis not present

## 2015-07-17 DIAGNOSIS — Z23 Encounter for immunization: Secondary | ICD-10-CM | POA: Diagnosis not present

## 2015-07-17 DIAGNOSIS — I10 Essential (primary) hypertension: Secondary | ICD-10-CM | POA: Diagnosis not present

## 2015-07-17 DIAGNOSIS — I6523 Occlusion and stenosis of bilateral carotid arteries: Secondary | ICD-10-CM

## 2015-07-17 DIAGNOSIS — E785 Hyperlipidemia, unspecified: Secondary | ICD-10-CM | POA: Diagnosis not present

## 2015-07-17 DIAGNOSIS — E119 Type 2 diabetes mellitus without complications: Secondary | ICD-10-CM | POA: Diagnosis not present

## 2015-07-17 LAB — POCT GLYCOSYLATED HEMOGLOBIN (HGB A1C): Hemoglobin A1C: 6

## 2015-07-17 NOTE — Progress Notes (Signed)
   Subjective:    Patient ID: Kara Kirby, female    DOB: Mar 11, 1946, 69 y.o.   MRN: [id number]  Diabetes She presents for her follow-up diabetic visit. She has type 2 diabetes mellitus. Pertinent negatives for hypoglycemia include no confusion. Pertinent negatives for diabetes include no chest pain, no fatigue, no polydipsia, no polyphagia and no weakness. Risk factors for coronary artery disease include diabetes mellitus, dyslipidemia, obesity, hypertension and post-menopausal. Current diabetic treatment includes oral agent (dual therapy). She is compliant with treatment all of the time. Her weight is stable. She is following a diabetic diet. She has not had a previous visit with a dietitian. She does not see a podiatrist.Eye exam is current.   Patient c/o sinus pressure. Patient relates head congestion drainage sinus pressure states the mucus is fairly clear denies wheezing fevers chills.  Patient concerned because pharmacy changed the shape of her doxazosin she is concerned this could cause fluctuations in her blood pressure occasionally gets dizzy when she stands up Patient in constant fear of having another stroke. She has had appropriate testing by Dr. Doren Custard did show significant increase of moderate blockage they are repeating ultrasound again late November. Patient with history hyperlipidemia takes her medication watch his diet closely understands importance keeping it under control Patient states blood pressures been under good control recently denies chest tightness pressure pain shortness breath takes her medicine denies complications  Review of Systems  Constitutional: Negative for fever, activity change, appetite change and fatigue.  HENT: Positive for congestion and rhinorrhea. Negative for ear pain.   Eyes: Negative for discharge.  Respiratory: Positive for cough. Negative for shortness of breath and wheezing.   Cardiovascular: Negative for chest pain.  Gastrointestinal:  Negative for abdominal pain.  Endocrine: Negative for polydipsia and polyphagia.  Neurological: Negative for weakness.  Psychiatric/Behavioral: Negative for confusion.       Objective:   Physical Exam  Constitutional: She appears well-developed and well-nourished. No distress.  HENT:  Head: Normocephalic.  Nose: Nose normal.  Mouth/Throat: Oropharynx is clear and moist. No oropharyngeal exudate.  Neck: Neck supple.  Cardiovascular: Normal rate, regular rhythm and normal heart sounds.   No murmur heard. Pulmonary/Chest: Effort normal and breath sounds normal. No respiratory distress. She has no wheezes.  Musculoskeletal: She exhibits no edema.  Lymphadenopathy:    She has no cervical adenopathy.  Neurological: She is alert. She exhibits normal muscle tone.  Skin: Skin is warm and dry.  Psychiatric: Her behavior is normal.  Nursing note and vitals reviewed.  Sinuses nontender eardrums were normal. Blood pressure laying sitting standing show some drop in blood pressure but blood pressure normal.       Assessment & Plan:  Carotid artery disease progressive she need to continue her medicines importance of keeping all risk factors under good control with stress Diabetes very good control continue current measures watch diet stay active Blood pressure very good continue current measures patient not orthostatic Viral upper respiratory versus allergies you supportive measures no need for antibiotics currently Hyperlipidemia taking medication well last lab work looked good continue medicine watch diet closely repeat all of her labs again in 3-4 months. 25 minutes greater than half was spent discussing the patient's situation with her as well as discussing her recent ultrasound and all of her chronic health issues thank you

## 2015-07-27 ENCOUNTER — Other Ambulatory Visit: Payer: Self-pay | Admitting: Family Medicine

## 2015-08-06 ENCOUNTER — Other Ambulatory Visit: Payer: Self-pay | Admitting: Family Medicine

## 2015-08-17 ENCOUNTER — Other Ambulatory Visit: Payer: Self-pay | Admitting: Family Medicine

## 2015-08-18 ENCOUNTER — Telehealth: Payer: Self-pay | Admitting: Family Medicine

## 2015-08-18 NOTE — Telephone Encounter (Signed)
Pt is needing a refill on her doxazosin. Pt is out.   [facility] [place]

## 2015-08-26 ENCOUNTER — Other Ambulatory Visit: Payer: Self-pay | Admitting: Family Medicine

## 2015-09-03 ENCOUNTER — Encounter (HOSPITAL_COMMUNITY): Payer: Medicare Other

## 2015-09-03 ENCOUNTER — Ambulatory Visit: Payer: Medicare Other | Admitting: Vascular Surgery

## 2015-09-08 ENCOUNTER — Encounter: Payer: Self-pay | Admitting: Vascular Surgery

## 2015-09-10 ENCOUNTER — Other Ambulatory Visit: Payer: Self-pay | Admitting: Vascular Surgery

## 2015-09-10 ENCOUNTER — Encounter: Payer: Self-pay | Admitting: Vascular Surgery

## 2015-09-10 ENCOUNTER — Ambulatory Visit (INDEPENDENT_AMBULATORY_CARE_PROVIDER_SITE_OTHER): Payer: Medicare Other | Admitting: Vascular Surgery

## 2015-09-10 ENCOUNTER — Ambulatory Visit (HOSPITAL_COMMUNITY)
Admission: RE | Admit: 2015-09-10 | Discharge: 2015-09-10 | Disposition: A | Discharge status: Home / Self Care | Payer: Medicare Other | Source: Ambulatory Visit | Attending: Vascular Surgery | Admitting: Vascular Surgery

## 2015-09-10 VITALS — BP 131/93 | HR 80 | Ht 64.0 in | Wt 184.5 lb

## 2015-09-10 DIAGNOSIS — E785 Hyperlipidemia, unspecified: Secondary | ICD-10-CM | POA: Diagnosis not present

## 2015-09-10 DIAGNOSIS — Z01812 Encounter for preprocedural laboratory examination: Secondary | ICD-10-CM | POA: Diagnosis not present

## 2015-09-10 DIAGNOSIS — Z48812 Encounter for surgical aftercare following surgery on the circulatory system: Secondary | ICD-10-CM

## 2015-09-10 DIAGNOSIS — Z9889 Other specified postprocedural states: Secondary | ICD-10-CM | POA: Diagnosis not present

## 2015-09-10 DIAGNOSIS — I6523 Occlusion and stenosis of bilateral carotid arteries: Secondary | ICD-10-CM

## 2015-09-10 DIAGNOSIS — E119 Type 2 diabetes mellitus without complications: Secondary | ICD-10-CM | POA: Diagnosis not present

## 2015-09-10 DIAGNOSIS — I10 Essential (primary) hypertension: Secondary | ICD-10-CM | POA: Diagnosis not present

## 2015-09-10 DIAGNOSIS — Z01810 Encounter for preprocedural cardiovascular examination: Secondary | ICD-10-CM | POA: Diagnosis not present

## 2015-09-10 LAB — CREATININE, SERUM: Creat: 0.81 mg/dL (ref 0.60–0.93)

## 2015-09-10 LAB — BUN: BUN: 15 mg/dL (ref 7–25)

## 2015-09-10 MED ORDER — DIPHENHYDRAMINE HCL 50 MG PO CAPS: ORAL_CAPSULE | ORAL | Status: DC

## 2015-09-10 MED ORDER — PREDNISONE 50 MG PO TABS: ORAL_TABLET | ORAL | Status: DC

## 2015-09-10 MED ORDER — CLOPIDOGREL BISULFATE 75 MG PO TABS: 75.0000 mg | ORAL_TABLET | Freq: Every day | ORAL | Status: DC

## 2015-09-10 NOTE — Progress Notes (Signed)
[facility]  Patient name: Kara Kirby MRN: [id number] DOB: [birth year] Sex: female  REASON FOR VISIT: Follow up of carotid disease  HPI: Kara Kirby is a 69 y.o. female who was last seen in our office by the nurse practitioner on 05/28/2015. She is undergone a previous right carotid endarterectomy in 2004. Carotid duplex scan at that time showed a recurrent 60-79% recurrent right carotid stenosis. She had no significant carotid stenosis on the left. He was on aspirin and was on Plavix. However, she is not currently on Plavix. Not sure when this was discontinued. She comes in for a 3 month follow up visit.  She denies any history of stroke, TIAs, expressive or receptive aphasia, or amaurosis fugax since her last visit.  Past Medical History  Diagnosis Date  . DM (diabetes mellitus) ([place])   . HTN (hypertension)   . Sleep related leg cramps   . Hyperlipidemia   . GERD (gastroesophageal reflux disease)     intermittent Sx  . Allergy   . Hematuria   . Renal insufficiency, mild   . Hip arthritis 2003    Left  . Dyslipidemia   . CVA (cerebral vascular accident) ([place]) 02-19-13  . Arthritis     Left shoulder  . Carotid artery occlusion     Family History  Problem Relation Age of Onset  . Colon cancer Neg Hx   . Colon polyps Neg Hx   . Hypertension Mother   . Heart attack Mother   . Heart disease Mother   . Hypertension Sister   . Diabetes Sister     Amputation  . Hyperlipidemia Sister   . Heart attack Sister   . Diabetes Sister   . Hypertension Sister   . Heart disease Sister     Before age 57  . Heart disease Father     SOCIAL HISTORY: Social History  Substance Use Topics  . Smoking status: Never Smoker   . Smokeless tobacco: Never Used  . Alcohol Use: No    Allergies  Allergen Reactions  . Other Hives    IV DYE     Current Outpatient Prescriptions  Medication Sig Dispense Refill  . amLODipine (NORVASC) 10 MG  tablet Take 1 tablet (10 mg total) by mouth daily. 30 tablet 12  . aspirin 325 MG tablet Take 1 tablet (325 mg total) by mouth daily.    Marland Kitchen atorvastatin (LIPITOR) 80 MG tablet TAKE ONE TABLET BY MOUTH ONCE DAILY 30 tablet 5  . chlorthalidone (HYGROTON) 25 MG tablet TAKE ONE TABLET BY MOUTH ONCE DAILY **STOP  HCTZ** 30 tablet 5  . cyclobenzaprine (FLEXERIL) 10 MG tablet Take one every 8 hours prn spasms. Do not use while driving. Caution drowsiness. 30 tablet 0  . doxazosin (CARDURA) 4 MG tablet TAKE ONE TABLET BY MOUTH AT BEDTIME 30 tablet 5  . fluconazole (DIFLUCAN) 150 MG tablet TAKE ONE TABLET BY MOUTH AS A ONE-TIME DOSE 1 tablet 0  . fluticasone (FLONASE) 50 MCG/ACT nasal spray USE TWO SPRAY(S) IN EACH NOSTRIL ONCE DAILY 16 g 5  . glipiZIDE (GLUCOTROL) 5 MG tablet TAKE ONE TABLET BY MOUTH TWICE DAILY 60 tablet 5  . glucose blood (ONE TOUCH ULTRA TEST) test strip ONE STRIP TO TEST BLOOD SUGAR ONCE DAILY 50 each 5  . KLOR-CON M10 10 MEQ tablet TAKE TWO TABLETS BY MOUTH ONCE DAILY 180 tablet 1  . lisinopril (PRINIVIL,ZESTRIL) 20 MG tablet TAKE TWO TABLETS BY MOUTH ONCE DAILY  180 tablet 0  . metFORMIN (GLUCOPHAGE) 500 MG tablet TAKE ONE TABLET BY MOUTH TWICE DAILY WITH A MEAL 180 tablet 1  . NITROSTAT 0.4 MG SL tablet DISSOLVE ONE TABLET UNDER THE TONGUE EVERY 5 MINUTES AS NEEDED FOR CHEST PAIN.  DO NOT EXCEED A TOTAL OF 3 DOSES IN 15 MINUTES 25 tablet 5  . potassium chloride (KLOR-CON M10) 10 MEQ tablet TAKE TWO TABLETS BY MOUTH EVERY DAY 180 tablet 1  . ranitidine (ZANTAC) 300 MG tablet TAKE ONE TABLET BY MOUTH ONCE DAILY 30 tablet 5  . traMADol (ULTRAM) 50 MG tablet Take 1 tablet (50 mg total) by mouth every 6 (six) hours as needed. Drowsy precautions 24 tablet 0  . VENTOLIN HFA 108 (90 BASE) MCG/ACT inhaler INHALE TWO PUFFS BY MOUTH EVERY 4 HOURS AS NEEDED 18 each 2  . zolpidem (AMBIEN) 5 MG tablet Take 1 tablet (5 mg total) by mouth at bedtime as needed for sleep. 30 tablet 5   No current  facility-administered medications for this visit.    REVIEW OF SYSTEMS:  [X]  denotes positive finding, [ ]  denotes negative finding Cardiac  Comments:  Chest pain or chest pressure:    Shortness of breath upon exertion:    Short of breath when lying flat:    Irregular heart rhythm:        Vascular    Pain in calf, thigh, or hip brought on by ambulation:    Pain in feet at night that wakes you up from your sleep:     Blood clot in your veins:    Leg swelling:         Pulmonary    Oxygen at home:    Productive cough:     Wheezing:         Neurologic    Sudden weakness in arms or legs:     Sudden numbness in arms or legs:     Sudden onset of difficulty speaking or slurred speech:    Temporary loss of vision in one eye:     Problems with dizziness:         Gastrointestinal    Blood in stool:     Vomited blood:         Genitourinary    Burning when urinating:     Blood in urine:        Psychiatric    Major depression:         Hematologic    Bleeding problems:    Problems with blood clotting too easily:        Skin    Rashes or ulcers:        Constitutional    Fever or chills:      PHYSICAL EXAM: Filed Vitals:   09/10/15 1113 09/10/15 1116  BP: 163/82 131/93  Pulse: 80   Height: 5\' 4"  (1.626 m)   Weight: 184 lb 8 oz (83.689 kg)   SpO2: 100%     GENERAL: The patient is a well-nourished female, in no acute distress. The vital signs are documented above. CARDIAC: There is a regular rate and rhythm.  VASCULAR: I do not detect carotid bruits. She has palpable femoral pulses bilaterally. She has no significant lower extremity swelling. PULMONARY: There is good air exchange bilaterally without wheezing or rales. ABDOMEN: Soft and non-tender with normal pitched bowel sounds.  MUSCULOSKELETAL: There are no major deformities or cyanosis. NEUROLOGIC: No focal weakness or paresthesias are detected. SKIN: There are no ulcers or rashes  noted. PSYCHIATRIC: The  patient has a normal affect.  DATA:   CAROTID DUPLEX SCAN: I have independently interpreted the carotid duplex scan today.  On the right side, the stenosis has progressed to 80%. Peak systolic velocity is 0000000 cm/s with an end-diastolic velocity of [id number] cm/s. She is noted to have a high bifurcation on the right and also a normal ICA segment cannot be visualized beyond the stenosis.  MEDICAL ISSUES:  ASYMPTOMATIC GREATER THAN 80% RECURRENT RIGHT CAROTID STENOSIS: I have recommended that we proceed with a CT scan of the neck and upper chest in order to determine if she is a candidate for a carotid stent. She is not had any symptoms since 2014. However given the progression of the stenosis on the right and given that she is fairly young I think this should be addressed if possible. I will arrange for the CT scan and then have her follow up with Dr. Oneida Alar or Dr. Trula Slade in order to discuss possible carotid stenting. She is currently not on Plavix and this would have to be restarted if she is a candidate for stent. If she is not a candidate for stent, the CT angiogram would help [place] determine if the stenosis were surgically accessible. Based on the duplex scan, I'm concerned that it might not be surgically accessible. She is not a smoker. She is on aspirin. And she is on a statin.  HYPERTENSION: The patient's initial blood pressure today was elevated. We repeated this and this was still elevated. We have encouraged the patient to follow up with their primary care physician for management of their blood pressure.   Deitra Mayo [facility]: [phone]

## 2015-09-10 NOTE — Addendum Note (Signed)
Addended by: Mena Goes on: 09/10/2015 12:01 PM   Modules accepted: Orders

## 2015-09-10 NOTE — Patient Instructions (Addendum)
°Carotid Artery Disease °The carotid arteries are the two main arteries on either side of the neck that supply blood to the brain. Carotid artery disease, also called carotid artery stenosis, is the narrowing or blockage of one or both carotid arteries. Carotid artery disease increases your risk for a stroke or a transient ischemic attack (TIA). A TIA is an episode in which a waxy, fatty substance that accumulates within the artery (plaque) blocks blood flow to the brain. A TIA is considered a "warning stroke."  °CAUSES  °· Buildup of plaque inside the carotid arteries (atherosclerosis) (common). °· A weakened outpouching in an artery (aneurysm). °· Inflammation of the carotid artery (arteritis). °· A fibrous growth within the carotid artery (fibromuscular dysplasia). °· Tissue death within the carotid artery due to radiation treatment (post-radiation necrosis). °· Decreased blood flow due to spasms of the carotid artery (vasospasm). °· Separation of the walls of the carotid artery (carotid dissection). °RISK FACTORS °· High cholesterol (dyslipidemia).   °· High blood pressure (hypertension).   °· Smoking.   °· Obesity.   °· Diabetes.   °· Family history of cardiovascular disease.   °· Inactivity or lack of regular exercise.   °· Being female. Men have an increased risk of developing atherosclerosis earlier in life than women.   °SYMPTOMS  °Carotid artery disease does not cause symptoms. °DIAGNOSIS °Diagnosis of carotid artery disease may include:  °· A physical exam. Your health care provider may hear an abnormal sound (bruit) when listening to the carotid arteries.   °· Specific tests that look at the blood flow in the carotid arteries. These tests include:   °¨ Carotid artery ultrasonography.   °¨ Carotid or cerebral angiography.   °¨ Computerized tomographic angiography (CTA).   °¨ Magnetic resonance angiography (MRA).   °TREATMENT  °Treatment of carotid artery disease can include a combination of treatments.  Treatment options include: °· Surgery. You may have:   °¨ A carotid endarterectomy. This is a surgery to remove the blockages in the carotid arteries.   °¨ A carotid angioplasty with stenting. This is a nonsurgical interventional procedure. A wire mesh (stent) is used to widen the blocked carotid arteries.   °· Medicines to control blood pressure, cholesterol, and reduce blood clotting (antiplatelet therapy).   °· Adjusting your diet.   °· Lifestyle changes such as:   °¨ Quitting smoking.   °¨ Exercising as tolerated or as directed by your health care provider.   °¨ Controlling and maintaining a good blood pressure.   °¨ Keeping cholesterol levels under control.   °HOME CARE INSTRUCTIONS  °· Take medicines only as directed by your health care provider. Make sure you understand all your medicine instructions. Do not stop your medicines without talking to your health care provider.   °· Follow your health care provider's diet instructions. It is important to eat a healthy diet that is low in saturated fats and includes plenty of fresh fruits, vegetables, and lean meats. High-fat, high-sodium foods as well as foods that are fried, overly processed, or have poor nutritional value should be avoided. °· Maintain a healthy weight.   °· Stay physically active. It is recommended that you get at least 30 minutes of activity every day.   °· Do not use any tobacco products including cigarettes, chewing tobacco, or electronic cigarettes. If you need help quitting, ask your health care provider. °· Limit alcohol use to:   °¨ No more than 2 drinks per day for men.   °¨ No more than 1 drink per day for nonpregnant women.   °· Do not use illegal drugs.   °· Keep all follow-up visits as directed by your health   care provider.  SEEK IMMEDIATE MEDICAL CARE IF:  You develop TIA or stroke symptoms. These include:   Sudden weakness or numbness on one side of the body, such as in the face, arm, or leg.   Sudden confusion.    Trouble speaking (aphasia) or understanding.   Sudden trouble seeing out of one or both eyes.   Sudden trouble walking.   Dizziness or feeling like you might faint.   Loss of balance or coordination.   Sudden severe headache with no known cause.   Sudden trouble swallowing (dysphagia).  If you have any of these symptoms, call your local emergency services (911 in [place] Do not drive yourself to the clinic or hospital. This is a medical emergency.    This information is not intended to replace advice given to you by your health care provider. Make sure you discuss any questions you have with your health care provider.   Document Released: 12/20/2011 Document Revised: 10/18/2014 Document Reviewed: 03/28/2013 Elsevier Interactive Patient Education 2016 [place] TO CTA SCAN OF NECK: One tablet (50mg ) 13 hours prior to procedure; one tablet (50mg ) 7 hours prior to procedure and then one tablet (50 mg) one hour prior to procedure.   AND TAKE TWO of BENADRYL 25mg  CAPSULES ONE HOUR PRIOR TO TEST.

## 2015-09-18 ENCOUNTER — Ambulatory Visit
Admission: RE | Admit: 2015-09-18 | Discharge: 2015-09-18 | Disposition: A | Discharge status: Home / Self Care | Payer: Medicare Other | Source: Ambulatory Visit | Attending: Vascular Surgery | Admitting: Vascular Surgery

## 2015-09-18 DIAGNOSIS — Z01810 Encounter for preprocedural cardiovascular examination: Secondary | ICD-10-CM

## 2015-09-18 DIAGNOSIS — I6523 Occlusion and stenosis of bilateral carotid arteries: Secondary | ICD-10-CM

## 2015-09-18 MED ORDER — IOPAMIDOL (ISOVUE-370) INJECTION 76%
100.0000 mL | Freq: Once | INTRAVENOUS | Status: AC | PRN
  Administered 2015-09-18: 100 mL via INTRAVENOUS

## 2015-09-19 ENCOUNTER — Encounter: Payer: Self-pay | Admitting: Vascular Surgery

## 2015-09-25 ENCOUNTER — Other Ambulatory Visit: Payer: Self-pay

## 2015-09-25 ENCOUNTER — Ambulatory Visit (INDEPENDENT_AMBULATORY_CARE_PROVIDER_SITE_OTHER): Payer: Medicare Other | Admitting: Vascular Surgery

## 2015-09-25 ENCOUNTER — Encounter: Payer: Self-pay | Admitting: Vascular Surgery

## 2015-09-25 VITALS — BP 148/72 | HR 91 | Temp 98.3°F | Ht 64.0 in | Wt 184.0 lb

## 2015-09-25 DIAGNOSIS — I6521 Occlusion and stenosis of right carotid artery: Secondary | ICD-10-CM

## 2015-09-25 DIAGNOSIS — Z91041 Radiographic dye allergy status: Secondary | ICD-10-CM

## 2015-09-25 DIAGNOSIS — I6523 Occlusion and stenosis of bilateral carotid arteries: Secondary | ICD-10-CM

## 2015-09-25 DIAGNOSIS — Z01810 Encounter for preprocedural cardiovascular examination: Secondary | ICD-10-CM

## 2015-09-25 MED ORDER — DIPHENHYDRAMINE HCL 50 MG PO CAPS: ORAL_CAPSULE | ORAL | Status: DC

## 2015-09-25 MED ORDER — PREDNISONE 50 MG PO TABS: ORAL_TABLET | ORAL | Status: DC

## 2015-09-25 NOTE — Progress Notes (Signed)
[facility] [place] HISTORY AND PHYSICAL   History of Present Illness:  Patient is a 69 year old female referred by Dr. Scot Dock for evaluation for carotid stenting. The patient had a right carotid endarterectomy in 2004. She was noted on recent surveillance to have recurrent stenosis. She denies any current symptoms of TIA amaurosis or stroke. 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 This resolved. She states also in 2014 she may have had a stroke but really had nonfocal symptoms. She denies any recent symptoms. She currently is on Plavix and aspirin since early December. She has a history of a contrast reaction of hives. Chronic medical problems include diabetes hypertension hyperlipidemia mild renal insufficiency. These are all currently stable. Most recent serum creatinine in June 2016 was normal.   Past Medical History  Diagnosis Date  . DM (diabetes mellitus) ([place])   . HTN (hypertension)   . Sleep related leg cramps   . Hyperlipidemia   . GERD (gastroesophageal reflux disease)     intermittent Sx  . Allergy   . Hematuria   . Renal insufficiency, mild   . Hip arthritis 2003    Left  . Dyslipidemia   . CVA (cerebral vascular accident) ([place]) 02-19-13  . Arthritis     Left shoulder  . Carotid artery occlusion     Past Surgical History  Procedure Laterality Date  . S/p hysterectomy    . Colonoscopy    . Total abdominal hysterectomy      FIBROID, HYPERMENORRHEA  . Colonoscopy  10/19/2011    Procedure: COLONOSCOPY;  Surgeon: Dorothyann Peng, MD;  Location: [facility];  Service: Endoscopy;  Laterality: N/A;  10:15  . Carotid endarterectomy  RIGHT    Social History Social History  Substance Use Topics  . Smoking status: Never Smoker   . Smokeless tobacco: Never Used  . Alcohol Use: No    Family History Family History  Problem Relation Age of Onset  . Colon cancer Neg Hx   . Colon  polyps Neg Hx   . Hypertension Mother   . Heart attack Mother   . Heart disease Mother   . Hypertension Sister   . Diabetes Sister     Amputation  . Hyperlipidemia Sister   . Heart attack Sister   . Diabetes Sister   . Hypertension Sister   . Heart disease Sister     Before age 75  . Heart disease Father     Allergies  Allergies  Allergen Reactions  . Other Hives    IV DYE      Current Outpatient Prescriptions  Medication Sig Dispense Refill  . amLODipine (NORVASC) 10 MG tablet Take 1 tablet (10 mg total) by mouth daily. 30 tablet 12  . aspirin 325 MG tablet Take 1 tablet (325 mg total) by mouth daily. (Patient taking differently: Take 81 mg by mouth daily. )    . atorvastatin (LIPITOR) 80 MG tablet TAKE ONE TABLET BY MOUTH ONCE DAILY 30 tablet 5  . chlorthalidone (HYGROTON) 25 MG tablet TAKE ONE TABLET BY MOUTH ONCE DAILY **STOP  HCTZ** 30 tablet 5  . clopidogrel (PLAVIX) 75 MG tablet Take 1 tablet (75 mg total) by mouth daily. 30 tablet 6  . cyclobenzaprine (FLEXERIL) 10 MG tablet Take one every 8 hours prn spasms. Do not use while driving. Caution drowsiness. 30 tablet 0  . doxazosin (CARDURA) 4 MG tablet TAKE ONE TABLET  BY MOUTH AT BEDTIME 30 tablet 5  . fluconazole (DIFLUCAN) 150 MG tablet TAKE ONE TABLET BY MOUTH AS A ONE-TIME DOSE 1 tablet 0  . fluticasone (FLONASE) 50 MCG/ACT nasal spray USE TWO SPRAY(S) IN EACH NOSTRIL ONCE DAILY 16 g 5  . glipiZIDE (GLUCOTROL) 5 MG tablet TAKE ONE TABLET BY MOUTH TWICE DAILY 60 tablet 5  . glucose blood (ONE TOUCH ULTRA TEST) test strip ONE STRIP TO TEST BLOOD SUGAR ONCE DAILY 50 each 5  . KLOR-CON M10 10 MEQ tablet TAKE TWO TABLETS BY MOUTH ONCE DAILY 180 tablet 1  . lisinopril (PRINIVIL,ZESTRIL) 20 MG tablet TAKE TWO TABLETS BY MOUTH ONCE DAILY 180 tablet 0  . metFORMIN (GLUCOPHAGE) 500 MG tablet TAKE ONE TABLET BY MOUTH TWICE DAILY WITH A MEAL 180 tablet 1  . NITROSTAT 0.4 MG SL tablet DISSOLVE ONE TABLET UNDER THE TONGUE EVERY  5 MINUTES AS NEEDED FOR CHEST PAIN.  DO NOT EXCEED A TOTAL OF 3 DOSES IN 15 MINUTES 25 tablet 5  . predniSONE (DELTASONE) 50 MG tablet One tablet (50mg ) 13 hours prior to procedure; one tablet (50mg ) 7 hours prior to procedure and then one tablet (50 mg) one hour prior to procedure. 3 tablet 0  . ranitidine (ZANTAC) 300 MG tablet TAKE ONE TABLET BY MOUTH ONCE DAILY 30 tablet 5  . VENTOLIN HFA 108 (90 BASE) MCG/ACT inhaler INHALE TWO PUFFS BY MOUTH EVERY 4 HOURS AS NEEDED 18 each 2  . zolpidem (AMBIEN) 5 MG tablet Take 1 tablet (5 mg total) by mouth at bedtime as needed for sleep. 30 tablet 5  . diphenhydrAMINE (BENADRYL) 50 MG capsule Take 50 mg by mouth 1 hour prior to your procedure. (Patient not taking: Reported on 09/25/2015) 2 capsule 0  . [START ON 10/17/2015] diphenhydrAMINE (BENADRYL) 50 MG capsule Take 50 mg by mouth 1 hour prior to your procedure (10/17/15 at 6:30 am). 1 capsule 0  . potassium chloride (KLOR-CON M10) 10 MEQ tablet TAKE TWO TABLETS BY MOUTH EVERY DAY (Patient not taking: Reported on 09/25/2015) 180 tablet 1  . [START ON 10/16/2015] predniSONE (DELTASONE) 50 MG tablet One tablet (50mg ) 10/16/15 at 6:30 pm; one tablet (50mg ) 10/17/15 at 12:30 am and then one tablet (50 mg) 10/17/15 at 6:30 am. 3 tablet 0  . traMADol (ULTRAM) 50 MG tablet Take 1 tablet (50 mg total) by mouth every 6 (six) hours as needed. Drowsy precautions (Patient not taking: Reported on 09/25/2015) 24 tablet 0   No current facility-administered medications for this visit.    ROS:   General:  No weight loss, Fever, chills  HEENT: No recent headaches, no nasal bleeding, no visual changes, no sore throat  Neurologic: No dizziness, blackouts, seizures. No recent symptoms of stroke or mini- stroke. No recent episodes of slurred speech, or temporary blindness.  Cardiac: No recent episodes of chest pain/pressure, no shortness of breath at rest.  No shortness of breath with exertion.  Denies history of atrial fibrillation  or irregular heartbeat  Vascular: No history of rest pain in feet.  No history of claudication.  No history of non-healing ulcer, No history of DVT   Pulmonary: No home oxygen, no productive cough, no hemoptysis,  No asthma or wheezing  Musculoskeletal:  [ ]  Arthritis, [ ]  Low back pain,  [ ]  Joint pain  Hematologic:No history of hypercoagulable state.  No history of easy bleeding.  No history of anemia  Gastrointestinal: No hematochezia or melena,  No gastroesophageal reflux, no trouble swallowing  Urinary: [ ]  chronic Kidney disease, [ ]  on HD - [ ]  MWF or [ ]  TTHS, [ ]  Burning with urination, [ ]  Frequent urination, [ ]  Difficulty urinating;   Skin: No rashes  Psychological: No history of anxiety,  No history of depression   Physical Examination  Filed Vitals:   09/25/15 0845 09/25/15 0847  BP: 158/74 148/72  Pulse: 91   Temp: 98.3 F (36.8 C)   TempSrc: Oral   Height: 5\' 4"  (1.626 m)   Weight: 184 lb (83.462 kg)   SpO2: 99%     Body mass index is 31.57 kg/(m^2).  General:  Alert and oriented, no acute distress HEENT: Normal Neck: No bruit or JVD Pulmonary: Clear to auscultation bilaterally Cardiac: Regular Rate and Rhythm without murmur Abdomen: Soft, non-tender, non-distended, no mass, no scars Skin: No rash Extremity Pulses:  2+ radial, brachial, femoral pulses bilaterally Musculoskeletal: No deformity or edema  Neurologic: Upper and lower extremity motor 5/5 and symmetric, mild asymmetry coronary right mouth is slightly lower than left possibly due to her previous laceration in her left  DATA:  CT Angio the neck was reviewed this suggested bilateral high grade internal carotid artery stenosis (however left is very calcified and probably overestimated) and a 70% left subclavian stenosis.  Carotid duplex scan from our office on December 1 showed high-grade right internal carotid artery stenosis and 40-60% left internal carotid artery stenosis. Velocities on the  right were 420/123   ASSESSMENT:  Recurrent high-grade right internal carotid artery stenosis possible left internal carotid artery stenosis and left subclavian stenosis all of which are asymptomatic   PLAN:  Arch angiogram possible right carotid stenting scheduled for 10/17/2015. Risks benefits possible complications and procedure details were trying the patient today including but not limited to bleeding infection stroke risk of 3-5% recurrent stenosis risk risk contrast. We will premedicate her to per vent her from having contrast reaction which she has had in the past with a rash. This was recently prevented during her CT angiogram with prednisone and Benadryl.  Ruta Hinds, MD [facility] [place] Office: [phone] Pager: [phone]

## 2015-09-26 ENCOUNTER — Other Ambulatory Visit: Payer: Self-pay | Admitting: Family Medicine

## 2015-10-08 ENCOUNTER — Other Ambulatory Visit: Payer: Self-pay | Admitting: Family Medicine

## 2015-10-10 ENCOUNTER — Other Ambulatory Visit: Payer: Self-pay

## 2015-10-10 MED ORDER — GLUCOSE BLOOD VI STRP
ORAL_STRIP | Status: DC
Start: 2015-10-10 — End: 2016-11-11

## 2015-10-17 ENCOUNTER — Encounter (HOSPITAL_COMMUNITY): Payer: Self-pay | Admitting: Vascular Surgery

## 2015-10-17 ENCOUNTER — Encounter (HOSPITAL_COMMUNITY)
Admission: RE | Disposition: A | Discharge status: Home / Self Care | Payer: Medicare Other | Source: Ambulatory Visit | Attending: Vascular Surgery

## 2015-10-17 ENCOUNTER — Inpatient Hospital Stay (HOSPITAL_COMMUNITY)
Admission: RE | Admit: 2015-10-17 | Discharge: 2015-10-19 | DRG: 039 | Disposition: A | Discharge status: Home / Self Care | Payer: Medicare Other | Source: Ambulatory Visit | Attending: Vascular Surgery | Admitting: Vascular Surgery

## 2015-10-17 DIAGNOSIS — I6529 Occlusion and stenosis of unspecified carotid artery: Secondary | ICD-10-CM | POA: Diagnosis present

## 2015-10-17 DIAGNOSIS — Z7982 Long term (current) use of aspirin: Secondary | ICD-10-CM

## 2015-10-17 DIAGNOSIS — E876 Hypokalemia: Secondary | ICD-10-CM | POA: Diagnosis present

## 2015-10-17 DIAGNOSIS — I10 Essential (primary) hypertension: Secondary | ICD-10-CM | POA: Diagnosis present

## 2015-10-17 DIAGNOSIS — I6521 Occlusion and stenosis of right carotid artery: Principal | ICD-10-CM | POA: Diagnosis present

## 2015-10-17 DIAGNOSIS — E785 Hyperlipidemia, unspecified: Secondary | ICD-10-CM | POA: Diagnosis present

## 2015-10-17 DIAGNOSIS — Z7902 Long term (current) use of antithrombotics/antiplatelets: Secondary | ICD-10-CM

## 2015-10-17 DIAGNOSIS — Z7984 Long term (current) use of oral hypoglycemic drugs: Secondary | ICD-10-CM

## 2015-10-17 DIAGNOSIS — E1165 Type 2 diabetes mellitus with hyperglycemia: Secondary | ICD-10-CM | POA: Diagnosis present

## 2015-10-17 DIAGNOSIS — Z8673 Personal history of transient ischemic attack (TIA), and cerebral infarction without residual deficits: Secondary | ICD-10-CM

## 2015-10-17 HISTORY — PX: PERIPHERAL VASCULAR CATHETERIZATION: SHX172C

## 2015-10-17 LAB — POCT I-STAT, CHEM 8
BUN: 17 mg/dL (ref 6–20)
CALCIUM ION: 1.14 mmol/L (ref 1.13–1.30)
CHLORIDE: 103 mmol/L (ref 101–111)
CREATININE: 0.8 mg/dL (ref 0.44–1.00)
Glucose, Bld: 330 mg/dL — ABNORMAL HIGH (ref 65–99)
HCT: 46 % (ref 36.0–46.0)
Hemoglobin: 15.6 g/dL — ABNORMAL HIGH (ref 12.0–15.0)
Potassium: 3.5 mmol/L (ref 3.5–5.1)
Sodium: 141 mmol/L (ref 135–145)
TCO2: 24 mmol/L (ref 0–100)

## 2015-10-17 LAB — POCT ACTIVATED CLOTTING TIME: Activated Clotting Time: 343 seconds

## 2015-10-17 LAB — GLUCOSE, CAPILLARY: Glucose-Capillary: 251 mg/dL — ABNORMAL HIGH (ref 65–99)

## 2015-10-17 SURGERY — CAROTID PTA/STENT INTERVENTION
Laterality: Right

## 2015-10-17 MED ORDER — ATROPINE SULFATE 0.1 MG/ML IJ SOLN
INTRAMUSCULAR | Status: AC
  Filled 2015-10-17: qty 10

## 2015-10-17 MED ORDER — ONDANSETRON HCL 4 MG/2ML IJ SOLN: 4.0000 mg | Freq: Four times a day (QID) | INTRAMUSCULAR | Status: DC | PRN

## 2015-10-17 MED ORDER — METOPROLOL TARTRATE 1 MG/ML IV SOLN: 2.0000 mg | INTRAVENOUS | Status: DC | PRN

## 2015-10-17 MED ORDER — HYDRALAZINE HCL 20 MG/ML IJ SOLN: 5.0000 mg | INTRAMUSCULAR | Status: DC | PRN

## 2015-10-17 MED ORDER — LABETALOL HCL 5 MG/ML IV SOLN: 10.0000 mg | INTRAVENOUS | Status: DC | PRN

## 2015-10-17 MED ORDER — HEPARIN (PORCINE) IN NACL 2-0.9 UNIT/ML-% IJ SOLN
INTRAMUSCULAR | Status: AC
Start: 2015-10-17 — End: 2015-10-17
  Filled 2015-10-17: qty 1000

## 2015-10-17 MED ORDER — SODIUM CHLORIDE 0.9 % IV SOLN: 500.0000 mL | Freq: Once | INTRAVENOUS | Status: DC | PRN

## 2015-10-17 MED ORDER — BIVALIRUDIN 250 MG IV SOLR
INTRAVENOUS | Status: AC
  Filled 2015-10-17: qty 250

## 2015-10-17 MED ORDER — NOREPINEPHRINE BITARTRATE 1 MG/ML IV SOLN
INTRAVENOUS | Status: AC
  Filled 2015-10-17: qty 4

## 2015-10-17 MED ORDER — SODIUM CHLORIDE 0.45 % IV SOLN
INTRAVENOUS | Status: DC
  Administered 2015-10-17: 10:00:00 via INTRAVENOUS

## 2015-10-17 MED ORDER — MORPHINE SULFATE (PF) 2 MG/ML IV SOLN: 2.0000 mg | INTRAVENOUS | Status: DC | PRN

## 2015-10-17 MED ORDER — ALUM & MAG HYDROXIDE-SIMETH 200-200-20 MG/5ML PO SUSP: 15.0000 mL | ORAL | Status: DC | PRN

## 2015-10-17 MED ORDER — IODIXANOL 320 MG/ML IV SOLN
INTRAVENOUS | Status: DC | PRN
  Administered 2015-10-17: 190 mL via INTRA_ARTERIAL

## 2015-10-17 MED ORDER — MAGNESIUM SULFATE 2 GM/50ML IV SOLN: 2.0000 g | Freq: Every day | INTRAVENOUS | Status: DC | PRN

## 2015-10-17 MED ORDER — ACETAMINOPHEN 650 MG RE SUPP: 325.0000 mg | RECTAL | Status: DC | PRN

## 2015-10-17 MED ORDER — ACETAMINOPHEN 325 MG PO TABS: 325.0000 mg | ORAL_TABLET | ORAL | Status: DC | PRN

## 2015-10-17 MED ORDER — LIDOCAINE HCL (PF) 1 % IJ SOLN
INTRAMUSCULAR | Status: AC
  Filled 2015-10-17: qty 30

## 2015-10-17 MED ORDER — PANTOPRAZOLE SODIUM 40 MG PO TBEC
40.0000 mg | DELAYED_RELEASE_TABLET | Freq: Every day | ORAL | Status: DC
  Administered 2015-10-17 – 2015-10-19 (×3): 40 mg via ORAL
  Filled 2015-10-17 (×4): qty 1

## 2015-10-17 MED ORDER — BIVALIRUDIN BOLUS VIA INFUSION - CUPID
INTRAVENOUS | Status: DC | PRN
  Administered 2015-10-17: 62.625 mg via INTRAVENOUS

## 2015-10-17 MED ORDER — POTASSIUM CHLORIDE CRYS ER 20 MEQ PO TBCR
20.0000 meq | EXTENDED_RELEASE_TABLET | Freq: Every day | ORAL | Status: DC | PRN
Start: 2015-10-17 — End: 2015-10-19

## 2015-10-17 MED ORDER — LIDOCAINE HCL (PF) 1 % IJ SOLN
INTRAMUSCULAR | Status: DC | PRN
  Administered 2015-10-17: 09:00:00

## 2015-10-17 MED ORDER — DOCUSATE SODIUM 100 MG PO CAPS
100.0000 mg | ORAL_CAPSULE | Freq: Every day | ORAL | Status: DC
  Administered 2015-10-18 – 2015-10-19 (×2): 100 mg via ORAL
  Filled 2015-10-17 (×2): qty 1

## 2015-10-17 MED ORDER — OXYCODONE HCL 5 MG PO TABS: 5.0000 mg | ORAL_TABLET | ORAL | Status: DC | PRN

## 2015-10-17 MED ORDER — SODIUM CHLORIDE 0.9 % IV SOLN
INTRAVENOUS | Status: DC
  Administered 2015-10-17: 06:00:00 via INTRAVENOUS

## 2015-10-17 MED ORDER — SODIUM CHLORIDE 0.9 % IV SOLN
250.0000 mg | INTRAVENOUS | Status: DC | PRN
  Administered 2015-10-17: 1.75 mg/kg/h via INTRAVENOUS

## 2015-10-17 SURGICAL SUPPLY — 20 items
BALLN EMERGE MR 3.0X20 (BALLOONS) ×2
BALLN VIATRAC 5X20X135 (BALLOONS) ×2
BALLOON EMERGE MR 3.0X20 (BALLOONS) IMPLANT
BALLOON VIATRAC 5X20X135 (BALLOONS) IMPLANT
CATH ANGIO 5F BER2 100CM (CATHETERS) ×1 IMPLANT
CATH ANGIO 5F PIGTAIL 100CM (CATHETERS) ×1 IMPLANT
COVER PRB 48X5XTLSCP FOLD TPE (BAG) IMPLANT
COVER PROBE 5X48 (BAG) ×2
DEVICE EMBOSHIELD NAV6 4.0-7.0 (WIRE) ×1 IMPLANT
KIT ENCORE 26 ADVANTAGE (KITS) ×1 IMPLANT
KIT PV (KITS) ×2 IMPLANT
SHEATH PINNACLE 5F 10CM (SHEATH) ×1 IMPLANT
SHEATH PINNACLE 7F 10CM (SHEATH) ×1 IMPLANT
SHEATH SHUTTLE 7FR (SHEATH) ×1 IMPLANT
STENT XACT CAR 10-8X30X136 (Permanent Stent) ×1 IMPLANT
SYR MEDRAD MARK V 150ML (SYRINGE) ×2 IMPLANT
TRANSDUCER W/STOPCOCK (MISCELLANEOUS) ×2 IMPLANT
TRAY PV CATH (CUSTOM PROCEDURE TRAY) ×2 IMPLANT
WIRE AMPLATZ SSTIFF .035X260CM (WIRE) ×1 IMPLANT
WIRE HITORQ VERSACORE ST 145CM (WIRE) ×1 IMPLANT

## 2015-10-17 NOTE — Progress Notes (Addendum)
Inpatient Diabetes Program Recommendations  AACE/ADA: New Consensus Statement on Inpatient Glycemic Control (2015)  Target Ranges:  Prepandial:   less than 140 mg/dL      Peak postprandial:   less than 180 mg/dL (1-2 hours)      Critically ill patients:  140 - 180 mg/dL    Results for LEMMA, TRAINER (MRN [id number]) as of 10/17/2015 11:57  Ref. Range 10/17/2015 11:14  Glucose-Capillary Latest Ref Range: 65-99 mg/dL 251 (H)    Admit with: Carotid Stenosis  History: DM, HTN  Home DM Meds: Glipizide 5 mg bid         Metformin 500 mg bid  Current Insulin Orders:None yet    MD- Please place orders for Novolog Moderate SSI (0-15 units) TID AC + HS after surgery     --Will follow patient during hospitalization--  Wyn Quaker RN, MSN, CDE Diabetes Coordinator Inpatient Glycemic Control Team Team Pager: [phone] (8a-5p)

## 2015-10-17 NOTE — H&P (View-Only) (Signed)
[facility] [place] HISTORY AND PHYSICAL   History of Present Illness:  Patient is a 70 year old female referred by Dr. Scot Dock for evaluation for carotid stenting. The patient had a right carotid endarterectomy in 2004. She was noted on recent surveillance to have recurrent stenosis. She denies any current symptoms of TIA amaurosis or stroke. 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 This resolved. She states also in 2014 she may have had a stroke but really had nonfocal symptoms. She denies any recent symptoms. She currently is on Plavix and aspirin since early December. She has a history of a contrast reaction of hives. Chronic medical problems include diabetes hypertension hyperlipidemia mild renal insufficiency. These are all currently stable. Most recent serum creatinine in June 2016 was normal.   Past Medical History  Diagnosis Date  . DM (diabetes mellitus) ([place])   . HTN (hypertension)   . Sleep related leg cramps   . Hyperlipidemia   . GERD (gastroesophageal reflux disease)     intermittent Sx  . Allergy   . Hematuria   . Renal insufficiency, mild   . Hip arthritis 2003    Left  . Dyslipidemia   . CVA (cerebral vascular accident) ([place]) 02-19-13  . Arthritis     Left shoulder  . Carotid artery occlusion     Past Surgical History  Procedure Laterality Date  . S/p hysterectomy    . Colonoscopy    . Total abdominal hysterectomy      FIBROID, HYPERMENORRHEA  . Colonoscopy  10/19/2011    Procedure: COLONOSCOPY;  Surgeon: Dorothyann Peng, MD;  Location[facility];  Service: Endoscopy;  Laterality: N/A;  10:15  . Carotid endarterectomy  RIGHT    Social History Social History  Substance Use Topics  . Smoking status: Never Smoker   . Smokeless tobacco: Never Used  . Alcohol Use: No    Family History Family History  Problem Relation Age of Onset  . Colon cancer Neg Hx   . Colon  polyps Neg Hx   . Hypertension Mother   . Heart attack Mother   . Heart disease Mother   . Hypertension Sister   . Diabetes Sister     Amputation  . Hyperlipidemia Sister   . Heart attack Sister   . Diabetes Sister   . Hypertension Sister   . Heart disease Sister     Before age 8  . Heart disease Father     Allergies  Allergies  Allergen Reactions  . Other Hives    IV DYE      Current Outpatient Prescriptions  Medication Sig Dispense Refill  . amLODipine (NORVASC) 10 MG tablet Take 1 tablet (10 mg total) by mouth daily. 30 tablet 12  . aspirin 325 MG tablet Take 1 tablet (325 mg total) by mouth daily. (Patient taking differently: Take 81 mg by mouth daily. )    . atorvastatin (LIPITOR) 80 MG tablet TAKE ONE TABLET BY MOUTH ONCE DAILY 30 tablet 5  . chlorthalidone (HYGROTON) 25 MG tablet TAKE ONE TABLET BY MOUTH ONCE DAILY **STOP  HCTZ** 30 tablet 5  . clopidogrel (PLAVIX) 75 MG tablet Take 1 tablet (75 mg total) by mouth daily. 30 tablet 6  . cyclobenzaprine (FLEXERIL) 10 MG tablet Take one every 8 hours prn spasms. Do not use while driving. Caution drowsiness. 30 tablet 0  . doxazosin (CARDURA) 4 MG tablet TAKE ONE TABLET  BY MOUTH AT BEDTIME 30 tablet 5  . fluconazole (DIFLUCAN) 150 MG tablet TAKE ONE TABLET BY MOUTH AS A ONE-TIME DOSE 1 tablet 0  . fluticasone (FLONASE) 50 MCG/ACT nasal spray USE TWO SPRAY(S) IN EACH NOSTRIL ONCE DAILY 16 g 5  . glipiZIDE (GLUCOTROL) 5 MG tablet TAKE ONE TABLET BY MOUTH TWICE DAILY 60 tablet 5  . glucose blood (ONE TOUCH ULTRA TEST) test strip ONE STRIP TO TEST BLOOD SUGAR ONCE DAILY 50 each 5  . KLOR-CON M10 10 MEQ tablet TAKE TWO TABLETS BY MOUTH ONCE DAILY 180 tablet 1  . lisinopril (PRINIVIL,ZESTRIL) 20 MG tablet TAKE TWO TABLETS BY MOUTH ONCE DAILY 180 tablet 0  . metFORMIN (GLUCOPHAGE) 500 MG tablet TAKE ONE TABLET BY MOUTH TWICE DAILY WITH A MEAL 180 tablet 1  . NITROSTAT 0.4 MG SL tablet DISSOLVE ONE TABLET UNDER THE TONGUE EVERY  5 MINUTES AS NEEDED FOR CHEST PAIN.  DO NOT EXCEED A TOTAL OF 3 DOSES IN 15 MINUTES 25 tablet 5  . predniSONE (DELTASONE) 50 MG tablet One tablet (50mg ) 13 hours prior to procedure; one tablet (50mg ) 7 hours prior to procedure and then one tablet (50 mg) one hour prior to procedure. 3 tablet 0  . ranitidine (ZANTAC) 300 MG tablet TAKE ONE TABLET BY MOUTH ONCE DAILY 30 tablet 5  . VENTOLIN HFA 108 (90 BASE) MCG/ACT inhaler INHALE TWO PUFFS BY MOUTH EVERY 4 HOURS AS NEEDED 18 each 2  . zolpidem (AMBIEN) 5 MG tablet Take 1 tablet (5 mg total) by mouth at bedtime as needed for sleep. 30 tablet 5  . diphenhydrAMINE (BENADRYL) 50 MG capsule Take 50 mg by mouth 1 hour prior to your procedure. (Patient not taking: Reported on 09/25/2015) 2 capsule 0  . [START ON 10/17/2015] diphenhydrAMINE (BENADRYL) 50 MG capsule Take 50 mg by mouth 1 hour prior to your procedure (10/17/15 at 6:30 am). 1 capsule 0  . potassium chloride (KLOR-CON M10) 10 MEQ tablet TAKE TWO TABLETS BY MOUTH EVERY DAY (Patient not taking: Reported on 09/25/2015) 180 tablet 1  . [START ON 10/16/2015] predniSONE (DELTASONE) 50 MG tablet One tablet (50mg ) 10/16/15 at 6:30 pm; one tablet (50mg ) 10/17/15 at 12:30 am and then one tablet (50 mg) 10/17/15 at 6:30 am. 3 tablet 0  . traMADol (ULTRAM) 50 MG tablet Take 1 tablet (50 mg total) by mouth every 6 (six) hours as needed. Drowsy precautions (Patient not taking: Reported on 09/25/2015) 24 tablet 0   No current facility-administered medications for this visit.    ROS:   General:  No weight loss, Fever, chills  HEENT: No recent headaches, no nasal bleeding, no visual changes, no sore throat  Neurologic: No dizziness, blackouts, seizures. No recent symptoms of stroke or mini- stroke. No recent episodes of slurred speech, or temporary blindness.  Cardiac: No recent episodes of chest pain/pressure, no shortness of breath at rest.  No shortness of breath with exertion.  Denies history of atrial fibrillation  or irregular heartbeat  Vascular: No history of rest pain in feet.  No history of claudication.  No history of non-healing ulcer, No history of DVT   Pulmonary: No home oxygen, no productive cough, no hemoptysis,  No asthma or wheezing  Musculoskeletal:  [ ]  Arthritis, [ ]  Low back pain,  [ ]  Joint pain  Hematologic:No history of hypercoagulable state.  No history of easy bleeding.  No history of anemia  Gastrointestinal: No hematochezia or melena,  No gastroesophageal reflux, no trouble swallowing  Urinary: [ ]  chronic Kidney disease, [ ]  on HD - [ ]  MWF or [ ]  TTHS, [ ]  Burning with urination, [ ]  Frequent urination, [ ]  Difficulty urinating;   Skin: No rashes  Psychological: No history of anxiety,  No history of depression   Physical Examination  Filed Vitals:   09/25/15 0845 09/25/15 0847  BP: 158/74 148/72  Pulse: 91   Temp: 98.3 F (36.8 C)   TempSrc: Oral   Height: 5\' 4"  (1.626 m)   Weight: 184 lb (83.462 kg)   SpO2: 99%     Body mass index is 31.57 kg/(m^2).  General:  Alert and oriented, no acute distress HEENT: Normal Neck: No bruit or JVD Pulmonary: Clear to auscultation bilaterally Cardiac: Regular Rate and Rhythm without murmur Abdomen: Soft, non-tender, non-distended, no mass, no scars Skin: No rash Extremity Pulses:  2+ radial, brachial, femoral pulses bilaterally Musculoskeletal: No deformity or edema  Neurologic: Upper and lower extremity motor 5/5 and symmetric, mild asymmetry coronary right mouth is slightly lower than left possibly due to her previous laceration in her left  DATA:  CT Angio the neck was reviewed this suggested bilateral high grade internal carotid artery stenosis (however left is very calcified and probably overestimated) and a 70% left subclavian stenosis.  Carotid duplex scan from our office on December 1 showed high-grade right internal carotid artery stenosis and 40-60% left internal carotid artery stenosis. Velocities on the  right were 420/123   ASSESSMENT:  Recurrent high-grade right internal carotid artery stenosis possible left internal carotid artery stenosis and left subclavian stenosis all of which are asymptomatic   PLAN:  Arch angiogram possible right carotid stenting scheduled for 10/17/2015. Risks benefits possible complications and procedure details were trying the patient today including but not limited to bleeding infection stroke risk of 3-5% recurrent stenosis risk risk contrast. We will premedicate her to per vent her from having contrast reaction which she has had in the past with a rash. This was recently prevented during her CT angiogram with prednisone and Benadryl.  Ruta Hinds, MD [facility] [place] Office: [phone] Pager: [phone]

## 2015-10-17 NOTE — Progress Notes (Signed)
Eating [place] sandwich. Waiting on 3300 bed assignment.

## 2015-10-17 NOTE — Progress Notes (Signed)
Site area: rt groin fa sheath  Site Prior to Removal:  Level 0 Pressure Applied For:  20 minutes Manual:   yes Patient Status During Pull:  stable Post Pull Site:  Level  0 Post Pull Instructions Given:  yes Post Pull Pulses Present: yes Dressing Applied:  tegaderm Bedrest begins @ [id number] Comments:

## 2015-10-17 NOTE — Op Note (Signed)
Procedure: Righ carotid angiogram, right carotid angioplasty and stenting   Preoperative diagnosis: High-grade asymptomatic recurrent right internal carotid artery stenosis  Postoperative diagnosis: Same   Anesthesia: Local  Co-surgeon: Quay Burow M.D.   Operative findings: Right femoral sheath.  Right ICA stenosis treated 95% to 0% residual Abbott xact stent 10-8 x 30 mm  Operative details: After obtaining informed consent, the patient was taken to the [facility] lab. The patient was placed in supine position the Angio table. Both groins were prepped and draped in usual sterile fashion. Local anesthesia was infiltrated over the right common femoral artery. Ultrasound was used to locate the right common femoral artery.  An introducer needle was placed into the right common femoral artery and there was good backbleeding from this. A 0.035 Versacore wire was then threaded up the abdominal aorta under fluoroscopic guidance. A 5 Fr sheath was placed over the guidewire into the right common femoral artery.  This was thoroughly flushed with heparinized saline.  A 5 French pigtail catheter was advanced up into the ascending aorta and arch aortogram performed in a 40 LAO projection. This showed normal arch anatomy with a type II arch and patent great vessels. There was moderate tortuosity of the innominate and right common carotid. A 5 Fr Berenstein 2 catheter was placed over the wire and the wire advanced into the innominate followed by the right common carotid artery and this was confirmed by contrast angiogram.  There is a 95% stenosis of the right internal carotid with moderate calcification.  The narrowing is at the distal end of the patch at approximately the level of C2 vertebral body The external carotid is patent.  Measurements were taken of the proximal distal and length of the lesion.  10 x 8 x 30 Abbott xact stent was selected.  An 035 Amplatz wire was then placed throught the Berenstein catheter and  advanced to just below the mid right common carotid.  A 7 French Cook shuttle sheath was then brought up on the operative field and advanced over the guidewire up into the right common carotid artery.  This was advanced over the dilator to the distal right common carotid artery.  Angiomax bolus was given.  ACT was also checked and was confirmed > 200. At this point an Abbott 7 mm filter was brought on the operative field and this was advanced up to the level of the carotid stenosis.  With some manipulation the guidewire advanced through the stenosis with minimal difficulty. The filter was then advanced into the distal internal carotid artery at the level of the skull base. The filter was then deployed in this location. At this point a repeat contrast angiogram was performed to again determined the level of the stenosis. A 5 x 2 angioplasty balloon was brought in operative field and advanced to the level of stenosis and quickly inflated and deflated to 6 atmospheres. The predilatation balloon was removed. A 10 x 8 x 30 Abbott xact stent was then brought in the operative field and advanced to the level of stenosis.  Again using angiographic guidance, the stent was deployed so that the tip of the stent was past the level of the high-grade stenosis. The stent was then deployed . A postdilatation was then performed after the stent delivery device was removed. This was done with a 5 x 2 balloon inflated to 8 atmospheres up and down.  A completion arteriogram was performed which showed that the residual stenosis was essentially 0 and the stent  had good wall apposition.  The filter was retrieved Completion intracranial views were also performed in AP and lateral projection to be interpreted later by the neuroradiologist. The patient tolerated procedure well and there were no complications. After the completion arteriogram had been performed, the sheath was pulled back over a guidewire and these were then pulled down to the  guidewire and the sheath exchanged for a 7 French short sheath in the right femoral artery. This was thoroughly flushed with heparinized saline and was left in place to be pulled in the holding area after the ACT is less than 175.The patient tolerated the procedure well and there were no neurologic complications. The patient was transported to the holding area in stable condition.   Ruta Hinds, MD  [facility] [place]  Office: [phone]  Pager: [phone]

## 2015-10-17 NOTE — Interval H&P Note (Signed)
History and Physical Interval Note:  10/17/2015 7:41 AM  Kara Kirby  has presented today for surgery, with the diagnosis of right stent/right carotid stenosis  The various methods of treatment have been discussed with the patient and family. After consideration of risks, benefits and other options for treatment, the patient has consented to  Procedure(s): Carotid PTA/Stent Intervention (N/A) as a surgical intervention .  The patient's history has been reviewed, patient examined, no change in status, stable for surgery.  I have reviewed the patient's chart and labs.  Questions were answered to the patient's satisfaction.     Ruta Hinds

## 2015-10-17 NOTE — Progress Notes (Signed)
Neuro assessment unchanged. No deficits 

## 2015-10-17 NOTE — Progress Notes (Signed)
Utilization Review Completed.Kara Kirby T1/03/2016  

## 2015-10-18 LAB — BASIC METABOLIC PANEL
ANION GAP: 8 (ref 5–15)
BUN: 14 mg/dL (ref 6–20)
CALCIUM: 8.6 mg/dL — AB (ref 8.9–10.3)
CO2: 26 mmol/L (ref 22–32)
CREATININE: 0.87 mg/dL (ref 0.44–1.00)
Chloride: 105 mmol/L (ref 101–111)
Glucose, Bld: 244 mg/dL — ABNORMAL HIGH (ref 65–99)
Potassium: 3 mmol/L — ABNORMAL LOW (ref 3.5–5.1)
SODIUM: 139 mmol/L (ref 135–145)

## 2015-10-18 LAB — CBC
HEMATOCRIT: 31.1 % — AB (ref 36.0–46.0)
Hemoglobin: 10.2 g/dL — ABNORMAL LOW (ref 12.0–15.0)
MCH: 29.7 pg (ref 26.0–34.0)
MCHC: 32.8 g/dL (ref 30.0–36.0)
MCV: 90.4 fL (ref 78.0–100.0)
Platelets: 246 10*3/uL (ref 150–400)
RBC: 3.44 MIL/uL — ABNORMAL LOW (ref 3.87–5.11)
RDW: 13.8 % (ref 11.5–15.5)
WBC: 11.5 10*3/uL — AB (ref 4.0–10.5)

## 2015-10-18 LAB — GLUCOSE, CAPILLARY
GLUCOSE-CAPILLARY: 261 mg/dL — AB (ref 65–99)
GLUCOSE-CAPILLARY: 230 mg/dL — AB (ref 65–99)
Glucose-Capillary: 284 mg/dL — ABNORMAL HIGH (ref 65–99)
Glucose-Capillary: 192 mg/dL — ABNORMAL HIGH (ref 65–99)

## 2015-10-18 MED ORDER — POTASSIUM CHLORIDE CRYS ER 20 MEQ PO TBCR
20.0000 meq | EXTENDED_RELEASE_TABLET | Freq: Every day | ORAL | Status: DC
  Administered 2015-10-18 – 2015-10-19 (×2): 20 meq via ORAL
  Filled 2015-10-18 (×2): qty 1

## 2015-10-18 MED ORDER — AMLODIPINE BESYLATE 10 MG PO TABS
10.0000 mg | ORAL_TABLET | Freq: Every day | ORAL | Status: DC
  Administered 2015-10-18 – 2015-10-19 (×2): 10 mg via ORAL
  Filled 2015-10-18 (×2): qty 1

## 2015-10-18 MED ORDER — ASPIRIN 325 MG PO TABS
325.0000 mg | ORAL_TABLET | Freq: Every day | ORAL | Status: DC
  Administered 2015-10-19: 325 mg via ORAL
  Filled 2015-10-18 (×2): qty 1

## 2015-10-18 MED ORDER — INSULIN ASPART 100 UNIT/ML ~~LOC~~ SOLN
0.0000 [IU] | Freq: Three times a day (TID) | SUBCUTANEOUS | Status: DC
  Administered 2015-10-18: 5 [IU] via SUBCUTANEOUS
  Administered 2015-10-18: 8 [IU] via SUBCUTANEOUS
  Administered 2015-10-19: 5 [IU] via SUBCUTANEOUS

## 2015-10-18 MED ORDER — CHLORTHALIDONE 25 MG PO TABS
25.0000 mg | ORAL_TABLET | Freq: Every day | ORAL | Status: DC
  Administered 2015-10-19: 25 mg via ORAL
  Filled 2015-10-18: qty 1

## 2015-10-18 MED ORDER — CYCLOBENZAPRINE HCL 10 MG PO TABS: 10.0000 mg | ORAL_TABLET | Freq: Three times a day (TID) | ORAL | Status: DC | PRN

## 2015-10-18 MED ORDER — CHLORTHALIDONE 25 MG PO TABS
25.0000 mg | ORAL_TABLET | Freq: Every day | ORAL | Status: DC
  Filled 2015-10-18 (×2): qty 1

## 2015-10-18 MED ORDER — ALBUTEROL SULFATE (2.5 MG/3ML) 0.083% IN NEBU
3.0000 mL | INHALATION_SOLUTION | RESPIRATORY_TRACT | Status: DC
Start: 2015-10-18 — End: 2015-10-19
  Administered 2015-10-18 – 2015-10-19 (×4): 3 mL via RESPIRATORY_TRACT
  Filled 2015-10-18 (×4): qty 3

## 2015-10-18 MED ORDER — ZOLPIDEM TARTRATE 5 MG PO TABS: 5.0000 mg | ORAL_TABLET | Freq: Every evening | ORAL | Status: DC | PRN

## 2015-10-18 MED ORDER — GLIPIZIDE 5 MG PO TABS
5.0000 mg | ORAL_TABLET | Freq: Two times a day (BID) | ORAL | Status: DC
Start: 2015-10-18 — End: 2015-10-19
  Administered 2015-10-18 – 2015-10-19 (×3): 5 mg via ORAL
  Filled 2015-10-18 (×3): qty 1

## 2015-10-18 MED ORDER — ADULT MULTIVITAMIN W/MINERALS CH
1.0000 | ORAL_TABLET | Freq: Every day | ORAL | Status: DC
  Administered 2015-10-18 – 2015-10-19 (×2): 1 via ORAL
  Filled 2015-10-18 (×3): qty 1

## 2015-10-18 MED ORDER — NITROGLYCERIN 0.4 MG SL SUBL: 0.4000 mg | SUBLINGUAL_TABLET | SUBLINGUAL | Status: DC | PRN

## 2015-10-18 MED ORDER — ATORVASTATIN CALCIUM 80 MG PO TABS
80.0000 mg | ORAL_TABLET | Freq: Every day | ORAL | Status: DC
  Administered 2015-10-18: 80 mg via ORAL
  Filled 2015-10-18: qty 1

## 2015-10-18 MED ORDER — LISINOPRIL 40 MG PO TABS
40.0000 mg | ORAL_TABLET | Freq: Every day | ORAL | Status: DC
  Administered 2015-10-19: 40 mg via ORAL
  Filled 2015-10-18 (×2): qty 1

## 2015-10-18 MED ORDER — CLOPIDOGREL BISULFATE 75 MG PO TABS
75.0000 mg | ORAL_TABLET | Freq: Every day | ORAL | Status: DC
  Administered 2015-10-18 – 2015-10-19 (×2): 75 mg via ORAL
  Filled 2015-10-18 (×2): qty 1

## 2015-10-18 MED ORDER — DOXAZOSIN MESYLATE 4 MG PO TABS
4.0000 mg | ORAL_TABLET | Freq: Every day | ORAL | Status: DC
Start: 2015-10-18 — End: 2015-10-19
  Administered 2015-10-18: 4 mg via ORAL
  Filled 2015-10-18 (×3): qty 1

## 2015-10-18 MED ORDER — FLUTICASONE PROPIONATE 50 MCG/ACT NA SUSP
1.0000 | Freq: Every day | NASAL | Status: DC
  Administered 2015-10-18 – 2015-10-19 (×2): 1 via NASAL
  Filled 2015-10-18: qty 16

## 2015-10-18 NOTE — Progress Notes (Addendum)
[facility] [place]  Subjective  - feels ok   Objective 138/76 72 97.9 F (36.6 C) (Oral) 14 98%  Intake/Output Summary (Last 24 hours) at 10/18/15 [id number] Last data filed at 10/17/15 1500  Gross per 24 hour  Intake    200 ml  Output      0 ml  Net    200 ml   Right groin no hematoma Neuro ue/le 5/5 motor sensation and speech intact no facial asymmetry   Assessment/Planning: Doing well post carotid stent D/c home today if she can get a ride with weather conditions Plavix/ASA indefinitely Hypokalemia replete Hyperglycemia continue current meds resume metformin tomorrow  Ruta Hinds 10/18/2015 8:28 AM --  Laboratory Lab Results:  Recent Labs  10/17/15 0616 10/18/15 0348  WBC  --  11.5*  HGB 15.6* 10.2*  HCT 46.0 31.1*  PLT  --  246   BMET  Recent Labs  10/17/15 0616 10/18/15 0348  NA 141 139  K 3.5 3.0*  CL 103 105  CO2  --  26  GLUCOSE 330* 244*  BUN 17 14  CREATININE 0.80 0.87  CALCIUM  --  8.6*    COAG Lab Results  Component Value Date   INR 0.91 02/18/2013   No results found for: PTT

## 2015-10-19 LAB — MRSA PCR SCREENING: MRSA by PCR: NEGATIVE

## 2015-10-19 LAB — GLUCOSE, CAPILLARY: GLUCOSE-CAPILLARY: 228 mg/dL — AB (ref 65–99)

## 2015-10-19 MED ORDER — ALBUTEROL SULFATE (2.5 MG/3ML) 0.083% IN NEBU: 3.0000 mL | INHALATION_SOLUTION | RESPIRATORY_TRACT | Status: DC | PRN

## 2015-10-19 NOTE — Progress Notes (Signed)
[facility] [place]  Subjective  - feels ok   Objective 149/80 89 97.9 F (36.6 C) (Oral) 14 100%  Intake/Output Summary (Last 24 hours) at 10/19/15 0932 Last data filed at 10/19/15 0521  Gross per 24 hour  Intake    240 ml  Output    600 ml  Net   -360 ml   Neuro alert awake no weakness  Assessment/Planning: D/c home today  Kara Kirby 10/19/2015 9:32 AM --  Laboratory Lab Results:  Recent Labs  10/17/15 0616 10/18/15 0348  WBC  --  11.5*  HGB 15.6* 10.2*  HCT 46.0 31.1*  PLT  --  246   BMET  Recent Labs  10/17/15 0616 10/18/15 0348  NA 141 139  K 3.5 3.0*  CL 103 105  CO2  --  26  GLUCOSE 330* 244*  BUN 17 14  CREATININE 0.80 0.87  CALCIUM  --  8.6*    COAG Lab Results  Component Value Date   INR 0.91 02/18/2013   No results found for: PTT     \

## 2015-10-19 NOTE — Progress Notes (Signed)
Patient given discharge instructions and med req.   Desmond Dike RN

## 2015-10-20 ENCOUNTER — Other Ambulatory Visit: Payer: Self-pay | Admitting: *Deleted

## 2015-10-20 DIAGNOSIS — I6523 Occlusion and stenosis of bilateral carotid arteries: Secondary | ICD-10-CM

## 2015-10-21 ENCOUNTER — Telehealth: Payer: Self-pay | Admitting: Vascular Surgery

## 2015-10-21 MED FILL — Atropine Sulfate Inj 0.1 MG/ML: INTRAMUSCULAR | Qty: 10 | Status: CN

## 2015-10-21 NOTE — Telephone Encounter (Signed)
-----   Message from Gabriel Earing, [place] sent at 10/17/2015 12:35 PM EST ----- S/p carotid stenting 10/17/15.  F/u with Dr. Oneida Alar in 4 weeks with carotid duplex.  Thanks, Aldona Bar

## 2015-10-21 NOTE — Telephone Encounter (Signed)
LM for pt re appt, dpm °

## 2015-10-23 NOTE — Discharge Summary (Signed)
Discharge Summary     Kara Kirby [birth year] 70 y.o. female  [id number]  Admission Date: 10/17/2015  Discharge Date: 10/19/15  Physician: No att. providers found  Admission Diagnosis: right stent right carotid stenosis   HPI:   This is a 70 y.o. female referred by Dr. Scot Dock for evaluation for carotid stenting. The patient had a right carotid endarterectomy in 2004. She was noted on recent surveillance to have recurrent stenosis. She denies any current symptoms of TIA amaurosis or stroke. 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 This resolved. She states also in 2014 she may have had a stroke but really had nonfocal symptoms. She denies any recent symptoms. She currently is on Plavix and aspirin since early December. She has a history of a contrast reaction of hives. Chronic medical problems include diabetes hypertension hyperlipidemia mild renal insufficiency. These are all currently stable. Most recent serum creatinine in June 2016 was normal.  Hospital Course:  The patient was admitted to the hospital and taken to the operating room on 10/17/2015 and underwent Right carotid angiogram, right carotid angioplasty and stenting.   The pt tolerated the procedure well and was transported to the PACU in good condition.   By POD 1, the pt neuro status ws in tact.  Neuro ue/le 5/5 motor sensation and speech intact no facial asymmetry.  She was discharged on Plavix/ASA indefinitely.  She did have hypokalemia and it was supplemented.  Her Metformin was held for 2 days.  She was discharged on POD 2 only due to weather conditions.  The remainder of the hospital course consisted of increasing mobilization and increasing intake of solids without difficulty.  CBC    Component Value Date/Time   WBC 11.5* 10/18/2015 0348   RBC 3.44* 10/18/2015 0348   HGB 10.2* 10/18/2015 0348   HCT 31.1* 10/18/2015 0348   PLT 246  10/18/2015 0348   MCV 90.4 10/18/2015 0348   MCH 29.7 10/18/2015 0348   MCHC 32.8 10/18/2015 0348   RDW 13.8 10/18/2015 0348   LYMPHSABS 3.2 02/18/2013 1655   MONOABS 0.7 02/18/2013 1655   EOSABS 0.1 02/18/2013 1655   BASOSABS 0.0 02/18/2013 1655   BMET    Component Value Date/Time   NA 139 10/18/2015 0348   NA 143 03/14/2015 0823   K 3.0* 10/18/2015 0348   CL 105 10/18/2015 0348   CO2 26 10/18/2015 0348   GLUCOSE 244* 10/18/2015 0348   GLUCOSE 83 03/14/2015 0823   BUN 14 10/18/2015 0348   BUN 17 03/14/2015 0823   CREATININE 0.87 10/18/2015 0348   CREATININE 0.81 09/10/2015 1342   CALCIUM 8.6* 10/18/2015 0348   GFRNONAA >60 10/18/2015 0348   GFRAA >60 10/18/2015 0348      Discharge Instructions    Call MD for:  redness, tenderness, or signs of infection (pain, swelling, bleeding, redness, odor or green/yellow discharge around incision site)    Complete by:  As directed      Call MD for:  severe or increased pain, loss or decreased feeling  in affected limb(s)    Complete by:  As directed      Call MD for:  temperature >100.5    Complete by:  As directed      Driving Restrictions    Complete by:  As directed   No driving until Monday, October 20, 2015     Lifting restrictions    Complete by:  As  directed   No lifting for 4 weeks     Resume previous diet    Complete by:  As directed      may wash over wound with mild soap and water    Complete by:  As directed            Discharge Diagnosis:  right stent right carotid stenosis  Secondary Diagnosis: Patient Active Problem List   Diagnosis Date Noted  . Carotid stenosis 10/17/2015  . Insomnia 06/03/2014  . Atherosclerotic peripheral vascular disease with intermittent claudication ([place]) 02/01/2014  . Aftercare following surgery of the circulatory system, [place] 05/23/2013  . H/O acute sinusitis 03/02/2013  . HTN (hypertension) 02/19/2013  . Diabetes ([place]) 02/19/2013  . Hyperlipidemia 02/19/2013  . Stroke ([place])  02/18/2013  . Occlusion and stenosis of carotid artery without mention of cerebral infarction 01/10/2013   Past Medical History  Diagnosis Date  . DM (diabetes mellitus) ([place])   . HTN (hypertension)   . Sleep related leg cramps   . Hyperlipidemia   . GERD (gastroesophageal reflux disease)     intermittent Sx  . Allergy   . Hematuria   . Renal insufficiency, mild   . Hip arthritis 2003    Left  . Dyslipidemia   . CVA (cerebral vascular accident) ([place]) 02-19-13  . Arthritis     Left shoulder  . Carotid artery occlusion       Medication List    STOP taking these medications        diphenhydrAMINE 50 MG capsule  Commonly known as:  BENADRYL     predniSONE 50 MG tablet  Commonly known as:  DELTASONE      TAKE these medications        acetaminophen 325 MG tablet  Commonly known as:  TYLENOL  Take 325-650 mg by mouth 3 (three) times daily as needed for mild pain or headache.     amLODipine 10 MG tablet  Commonly known as:  NORVASC  TAKE ONE TABLET BY MOUTH ONCE DAILY FOR BLOOD PRESSURE     aspirin 325 MG tablet  Take 1 tablet (325 mg total) by mouth daily.     atorvastatin 80 MG tablet  Commonly known as:  LIPITOR  TAKE ONE TABLET BY MOUTH ONCE DAILY     chlorthalidone 25 MG tablet  Commonly known as:  HYGROTON  TAKE ONE TABLET BY MOUTH ONCE DAILY **STOP  HCTZ**     clopidogrel 75 MG tablet  Commonly known as:  PLAVIX  Take 1 tablet (75 mg total) by mouth daily.     cyclobenzaprine 10 MG tablet  Commonly known as:  FLEXERIL  TAKE ONE TABLET BY MOUTH EVERY 8 HOURS AS NEEDED FOR  SPASMS -  DO  NOT  USE  WHILE  DRIVING  - CAUTION  DROWSINESS     doxazosin 4 MG tablet  Commonly known as:  CARDURA  TAKE ONE TABLET BY MOUTH AT BEDTIME     fluticasone 50 MCG/ACT nasal spray  Commonly known as:  FLONASE  USE TWO SPRAY(S) IN EACH NOSTRIL ONCE DAILY     glipiZIDE 5 MG tablet  Commonly known as:  GLUCOTROL  TAKE ONE TABLET BY MOUTH TWICE DAILY     KLOR-CON M10  10 MEQ tablet  Generic drug:  potassium chloride  TAKE TWO TABLETS BY MOUTH ONCE DAILY     lisinopril 20 MG tablet  Commonly known as:  PRINIVIL,ZESTRIL  TAKE TWO TABLETS BY MOUTH ONCE DAILY  metFORMIN 500 MG tablet  Commonly known as:  GLUCOPHAGE  TAKE ONE TABLET BY MOUTH TWICE DAILY WITH A MEAL     multivitamin tablet  Take 1 tablet by mouth daily.     NITROSTAT 0.4 MG SL tablet  Generic drug:  nitroGLYCERIN  DISSOLVE ONE TABLET UNDER THE TONGUE EVERY 5 MINUTES AS NEEDED FOR CHEST PAIN.  DO NOT EXCEED A TOTAL OF 3 DOSES IN 15 MINUTES     ONE TOUCH ULTRA TEST test strip  Generic drug:  glucose blood  USE ONE STRIP TO CHECK GLUCOSE ONCE DAILY     glucose blood test strip  Commonly known as:  ONE TOUCH ULTRA TEST  USE ONE STRIP TO CHECK GLUCOSE ONCE DAILY, DX- E11.9     ranitidine 300 MG tablet  Commonly known as:  ZANTAC  TAKE ONE TABLET BY MOUTH ONCE DAILY     VENTOLIN HFA 108 (90 Base) MCG/ACT inhaler  Generic drug:  albuterol  INHALE TWO PUFFS BY MOUTH EVERY 4 HOURS AS NEEDED     zolpidem 5 MG tablet  Commonly known as:  AMBIEN  Take 1 tablet (5 mg total) by mouth at bedtime as needed for sleep.        Prescriptions given: None-pt was on Plavix pre-operatively  Disposition: home  Patient's condition: is Good  Follow up: 1. Dr. Oneida Alar in 4 weeks with carotid duplex   Leontine Locket, PA-C [facility] [phone]  --- For [facility] use --- Instructions: Press F2 to tab through selections.  Delete question if not applicable.   Modified Rankin score at D/C (0-6): 0  IV medication needed for:  1. Hypertension: No 2. Hypotension: No  Post-op Complications: No  1. Post-op CVA or TIA: No  If yes: Event classification (right eye, left eye, right cortical, left cortical, verterobasilar, other): n/a  If yes: Timing of event (intra-op, <6 hrs post-op, >=6 hrs post-op, unknown): n/a  2. CN injury: No  If yes: CN n/a injuried    3. Myocardial infarction: No  If yes: Dx by (EKG or clinical, Troponin): n/a  4.  CHF: No  5.  Dysrhythmia (new): No  6. Wound infection: No  7. Reperfusion symptoms: No  8. Return to OR: No  If yes: return to OR for (bleeding, neurologic, other CEA incision, other): n/a  Discharge medications: Statin use:  Yes If No: [ ]  For Medical reasons, [ ]  Non-compliant, [ ]  Not-indicated ASA use:  Yes  If No: [ ]  For Medical reasons, [ ]  Non-compliant, [ ]  Not-indicated Beta blocker use:  No If No: [ ]  For Medical reasons, [ ]  Non-compliant, [ ]  Not-indicated ACE-Inhibitor use:  Yes If No: [ ]  For Medical reasons, [ ]  Non-compliant, [ ]  Not-indicated ARB use:  No P2Y12 Antagonist use: Yes, [ x] Plavix, [ ]  Plasugrel, [ ]  Ticlopinine, [ ]  Ticagrelor, [ ]  Other, [ ]  No for medical reason, [ ]  Non-compliant, [ ]  Not-indicated Anti-coagulant use:  No, [ ]  Warfarin, [ ]  Rivaroxaban, [ ]  Dabigatran, [ ]  Other, [ ]  No for medical reason, [ ]  Non-compliant, [ ]  Not-indicated

## 2015-10-28 ENCOUNTER — Other Ambulatory Visit: Payer: Self-pay | Admitting: Family Medicine

## 2015-10-30 NOTE — Consult Note (Signed)
NAMELEHUA, EDEN          ACCOUNT NO.:  [id number]  MEDICAL RECORD NO.:  [id number]  LOCATION:  3S07C                        FACILITY:  [facility]  PHYSICIAN:  Dejay Kronk K. Ivelis Norgard, M.D.DATE OF BIRTH:  10/15/1945  DATE OF CONSULTATION: DATE OF DISCHARGE:  10/19/2015                                CONSULTATION   CLINICAL HISTORY:  Right carotid stenosis.  EXAMINATION:  Right common carotid arteriograms, intracranial interpretation pre and post stent-assistant angioplasty proximally.  FINDINGS:    The preangioplasty right common carotid arteriogram demonstrates the distal cervical petrous, cavernous, and supraclinoid segments to be widely patent.  Opacification of right middle cerebral artery is noted into the capillary and the venous phases and  non visualization of the right anterior cerebral artery is noted.  Post stent-assisted angioplasty right common carotid arteriogram demonstrates wide patency of the petrous, cavernous, and supraclinoid right internal carotid artery.  Opacification of right middle cerebral artery is normal into the capillary and venous phases.  There is  now flash opacification of the hypoplastic right anterior cerebral artery A1 segment.  No evidence of occlusions, stenoses, dissections, or intraluminal  filling defects are  seen.  IMPRESSION:    Pre and post right common carotid arteriograms demonstrating improved angiographic flow into the right anterior circulation following the stent-assisted angioplasty.          ______________________________ Fritz Pickerel Estanislado Pandy, M.D.     SKD/MEDQ  D:  10/29/2015  T:  10/30/2015  Job:  [id number]

## 2015-11-06 ENCOUNTER — Ambulatory Visit (INDEPENDENT_AMBULATORY_CARE_PROVIDER_SITE_OTHER): Payer: Medicare Other | Admitting: Family Medicine

## 2015-11-06 ENCOUNTER — Encounter: Payer: Self-pay | Admitting: Family Medicine

## 2015-11-06 VITALS — BP 154/90 | HR 69 | Temp 98.8°F | Ht 64.0 in | Wt 184.5 lb

## 2015-11-06 DIAGNOSIS — J4520 Mild intermittent asthma, uncomplicated: Secondary | ICD-10-CM | POA: Diagnosis not present

## 2015-11-06 DIAGNOSIS — J683 Other acute and subacute respiratory conditions due to chemicals, gases, fumes and vapors: Secondary | ICD-10-CM

## 2015-11-06 DIAGNOSIS — I6523 Occlusion and stenosis of bilateral carotid arteries: Secondary | ICD-10-CM | POA: Diagnosis not present

## 2015-11-06 NOTE — Progress Notes (Signed)
   Subjective:    Patient ID: Kara Kirby, female    DOB: 1945/10/16, 70 y.o.   MRN: [id number]  Cough This is a new problem. The current episode started in the past 7 days. The cough is non-productive. Associated symptoms include wheezing. Associated symptoms comments: Left side pain. Treatments tried: Ventolin inhaler.    patient notes occasional sharp pain left-sided. Worse with certain motions.    Patient has concerns of recent stent surgery. Wonders if she can lie on the same side as her carotid repair patient advised she can  Review of Systems  Respiratory: Positive for cough and wheezing.     no headache no fever cough generally nonproductive    Objective:   Physical Exam  alert vitals stable HEENT normal lungs clear other than mild wheezes no tachypnea heart regular rate and rhythm       Assessment & Plan:   impression 1 mild flare of reactive airways #2 musculoskeletal pain discussed plan Tylenol up to 4 times a day when necessary. Increase albuterol 2 sprays 4 times a day. Hold off on antibiotics. Follow-up as scheduled warning signs discussed WSL

## 2015-11-14 ENCOUNTER — Encounter: Payer: Self-pay | Admitting: Vascular Surgery

## 2015-11-14 ENCOUNTER — Telehealth: Payer: Self-pay

## 2015-11-14 NOTE — Telephone Encounter (Signed)
-----   Message from Elam Dutch, MD sent at 11/14/2015  3:31 PM EST ----- Regarding: RE: pre-med for dental hygiene Low risk of infection after 6 weeks.  Can prophylax if pt wishes in the future but overall low risk  Ruta Hinds ----- Message -----    From: Denman George, RN    Sent: 11/14/2015  12:42 PM      To: Elam Dutch, MD Subject: pre-med for dental hygiene                     rec'd call from Dr. Rickard Rhymes dental office inquiring about pre-med for dental hygiene. The pt. Is s/p (R) carotid angioplasty and stenting 10/17/15.  I advised that she should receive pre-med today, since pt. Is  1 mo. Post procedure.  How long should the pt. receive pre-med for dental procedures; is this indefinite?

## 2015-11-18 ENCOUNTER — Ambulatory Visit: Payer: Medicare Other | Admitting: Family Medicine

## 2015-11-18 NOTE — Telephone Encounter (Signed)
Notified Dr. Rickard Rhymes office representative, Otila Kluver, of recommendations per Dr. Oneida Alar re: future need for antibiotic prophylaxis.  Will fax note to [phone].

## 2015-11-19 ENCOUNTER — Ambulatory Visit: Payer: Medicare Other | Admitting: Family Medicine

## 2015-11-20 ENCOUNTER — Ambulatory Visit (INDEPENDENT_AMBULATORY_CARE_PROVIDER_SITE_OTHER): Payer: Self-pay | Admitting: Vascular Surgery

## 2015-11-20 ENCOUNTER — Encounter: Payer: Self-pay | Admitting: Vascular Surgery

## 2015-11-20 ENCOUNTER — Ambulatory Visit (HOSPITAL_COMMUNITY)
Admission: RE | Admit: 2015-11-20 | Discharge: 2015-11-20 | Disposition: A | Discharge status: Home / Self Care | Payer: Medicare Other | Source: Ambulatory Visit | Attending: Vascular Surgery | Admitting: Vascular Surgery

## 2015-11-20 VITALS — BP 174/89 | HR 76 | Temp 98.3°F | Resp 16 | Ht 64.0 in | Wt 183.0 lb

## 2015-11-20 DIAGNOSIS — I6523 Occlusion and stenosis of bilateral carotid arteries: Secondary | ICD-10-CM | POA: Diagnosis not present

## 2015-11-20 DIAGNOSIS — I10 Essential (primary) hypertension: Secondary | ICD-10-CM | POA: Diagnosis not present

## 2015-11-20 DIAGNOSIS — E785 Hyperlipidemia, unspecified: Secondary | ICD-10-CM | POA: Insufficient documentation

## 2015-11-20 DIAGNOSIS — E119 Type 2 diabetes mellitus without complications: Secondary | ICD-10-CM | POA: Diagnosis not present

## 2015-11-20 NOTE — Progress Notes (Signed)
[facility] [place] HISTORY AND PHYSICAL    History of Present Illness:  Patient is a 70 y.o. female who presents for post-operative follow-up after right carotid stent for recurrent stenosis. The procedure was on 10/17/2015. She also has a moderate chronic left internal carotid artery stenosis. She denies any symptoms of TIA amaurosis or stroke. She is currently on Plavix and aspirin.  Physical Examination  Filed Vitals:   11/20/15 1142 11/20/15 1143  BP: 176/91 174/89  Pulse: 76 76  Temp:    Resp:      Body mass index is 31.4 kg/(m^2).  General:  Alert and oriented, no acute distress Neck: No bruit or JVD Skin: No rash Neurologic: Upper and lower extremity motor 5/5 and symmetric  DATA: Patient had a carotid duplex scan today which showed a widely patent right internal carotid artery stent. 40-60 left internal carotid artery stenosis.   ASSESSMENT: Patent right internal carotid artery stent, 40-60% left internal carotid artery stenosis.   PLAN: Patient will follow-up in 6 months time for repeat carotid duplex scan. She will see our nurse practitioner at that time. If she has progression of disease over time I will continue to follow her carotid stent. Dr. Scot Dock will continue to take care of her other vascular issues that she had previously done her carotid endarterectomy at the patient's request.  Ruta Hinds, MD [facility] Office: [phone] Pager: [phone]

## 2015-11-20 NOTE — Progress Notes (Signed)
Filed Vitals:   11/20/15 1141 11/20/15 1142 11/20/15 1143  BP: 168/88 176/91 174/89  Pulse: 75 76 76  Temp: 98.3 F (36.8 C)    Resp: 16    Height: 5\' 4"  (1.626 m)    Weight: 183 lb (83.008 kg)    SpO2: 99%

## 2015-11-21 ENCOUNTER — Telehealth: Payer: Self-pay | Admitting: Nurse Practitioner

## 2015-11-21 ENCOUNTER — Encounter: Payer: Self-pay | Admitting: Family Medicine

## 2015-11-21 ENCOUNTER — Ambulatory Visit (INDEPENDENT_AMBULATORY_CARE_PROVIDER_SITE_OTHER): Payer: Medicare Other | Admitting: Family Medicine

## 2015-11-21 VITALS — BP 134/68 | Ht 64.0 in | Wt 183.5 lb

## 2015-11-21 DIAGNOSIS — E119 Type 2 diabetes mellitus without complications: Secondary | ICD-10-CM

## 2015-11-21 DIAGNOSIS — G4700 Insomnia, unspecified: Secondary | ICD-10-CM

## 2015-11-21 DIAGNOSIS — I6523 Occlusion and stenosis of bilateral carotid arteries: Secondary | ICD-10-CM

## 2015-11-21 DIAGNOSIS — E785 Hyperlipidemia, unspecified: Secondary | ICD-10-CM

## 2015-11-21 DIAGNOSIS — E876 Hypokalemia: Secondary | ICD-10-CM

## 2015-11-21 DIAGNOSIS — I10 Essential (primary) hypertension: Secondary | ICD-10-CM | POA: Diagnosis not present

## 2015-11-21 LAB — POCT GLYCOSYLATED HEMOGLOBIN (HGB A1C): HEMOGLOBIN A1C: 6.9

## 2015-11-21 MED ORDER — FLUCONAZOLE 150 MG PO TABS: 150.0000 mg | ORAL_TABLET | Freq: Once | ORAL | Status: DC

## 2015-11-21 NOTE — Telephone Encounter (Signed)
I recommend a preventive health physical but a PAP will most likely not be necessary due to age if her previous ones were normal.

## 2015-11-21 NOTE — Telephone Encounter (Signed)
[facility] 11/21/15

## 2015-11-21 NOTE — Addendum Note (Signed)
Addended by: Dorthula Rue L on: 11/21/2015 10:45 AM   Modules accepted: Orders

## 2015-11-21 NOTE — Progress Notes (Signed)
   Subjective:    Patient ID: Kara Kirby, female    DOB: [birth year], 70 y.o.   MRN: [id number]  Diabetes She presents for her follow-up diabetic visit. She has type 2 diabetes mellitus. There are no hypoglycemic associated symptoms. There are no diabetic associated symptoms. There are no hypoglycemic complications. There are no diabetic complications. There are no known risk factors for coronary artery disease. Current diabetic treatment includes oral agent (dual therapy). She is compliant with treatment all of the time.   Patient states that she has a knot on her right hand.   Patient wants to know can she take regular vitamins daily.  Patient states she has to take penicillin when she goes to the dentist. Patient states she is trying to watch starches in your diet stay physically active She does take her cholesterol medicine watches fats in the diet She does try to do good job watching her blood pressure take her medicines on a regular basis She does have some mild insomnia but medication seemed to help She has had some recent surgery how the carotid artery and she does follow-up with her eye specialist Patient states that she has some slight congestion and sniffles.   Review of Systems    currently no chest tightness pressure pain shortness breath nausea vomiting diarrhea no fevers chills low but a cough runny nose Objective:   Physical Exam Neck no masses lungs are clear no crackles heart regular pulse normal abdomen soft extremities no edema skin warm dry neurologic grossly normal patient with mild upper respiratory symptoms but no acute findings of bacterial component  25 minutes was spent with the patient. Greater than half the time was spent in discussion and answering questions and counseling regarding the issues that the patient came in for today.      Assessment & Plan:   viral upper respiratory illness no need for antibiotics just yet call us if ongoing troubles  hyperlipidemia continue current measures watch diet check lab work  hypertension good control no need to change medicines Insomnia Ambien does well caution drowsiness Hypokalemia check metabolic 7 Diabetes good control check urine creatinine ratio  Follow-up again in 4 months time  greater than 25 minutes spent with the patient greater than half time in discussion of these multiple issues

## 2015-11-21 NOTE — Telephone Encounter (Signed)
Kara Kirby  Pt was wondering if she needed to come in at some point in the future  For a pap smear?  I don't see she has had one since we have been on  Epic, but told her due to her age it may not be required any more.   Please advise

## 2015-11-21 NOTE — Telephone Encounter (Signed)
Spoke with patient and informed her per Dixon Boos recommend a preventive health physical but a PAP will most likely not be necessary due to age if her previous ones were normal. Patient verbalized understanding.

## 2015-11-22 LAB — MICROALBUMIN / CREATININE URINE RATIO
Creatinine, Urine: 88.8 mg/dL
MICROALB/CREAT RATIO: 7.1 mg/g creat (ref 0.0–30.0)
Microalbumin, Urine: 6.3 ug/mL

## 2015-11-22 LAB — BASIC METABOLIC PANEL
BUN / CREAT RATIO: 16 (ref 11–26)
BUN: 13 mg/dL (ref 8–27)
CO2: 24 mmol/L (ref 18–29)
CREATININE: 0.82 mg/dL (ref 0.57–1.00)
Calcium: 9.7 mg/dL (ref 8.7–10.3)
Chloride: 100 mmol/L (ref 96–106)
GFR, EST AFRICAN AMERICAN: 84 mL/min/{1.73_m2} (ref >59)
GFR, EST NON AFRICAN AMERICAN: 73 mL/min/{1.73_m2} (ref >59)
GLUCOSE: 156 mg/dL — AB (ref 65–99)
Potassium: 3.7 mmol/L (ref 3.5–5.2)
SODIUM: 142 mmol/L (ref 134–144)

## 2015-11-22 LAB — LIPID PANEL
CHOLESTEROL TOTAL: 161 mg/dL (ref 100–199)
Chol/HDL Ratio: 3.6 ratio units (ref 0.0–4.4)
HDL: 45 mg/dL (ref >39)
LDL Calculated: 90 mg/dL (ref 0–99)
TRIGLYCERIDES: 129 mg/dL (ref 0–149)
VLDL CHOLESTEROL CAL: 26 mg/dL (ref 5–40)

## 2015-11-23 ENCOUNTER — Encounter: Payer: Self-pay | Admitting: Family Medicine

## 2015-12-24 ENCOUNTER — Other Ambulatory Visit: Payer: Self-pay | Admitting: Family Medicine

## 2015-12-25 NOTE — Telephone Encounter (Signed)
May have refill of each with 5 additional refills

## 2015-12-25 NOTE — Telephone Encounter (Signed)
May we refill Ambien?

## 2016-01-01 DIAGNOSIS — E113299 Type 2 diabetes mellitus with mild nonproliferative diabetic retinopathy without macular edema, unspecified eye: Secondary | ICD-10-CM | POA: Diagnosis not present

## 2016-01-01 DIAGNOSIS — H40003 Preglaucoma, unspecified, bilateral: Secondary | ICD-10-CM | POA: Diagnosis not present

## 2016-01-01 DIAGNOSIS — H25813 Combined forms of age-related cataract, bilateral: Secondary | ICD-10-CM | POA: Diagnosis not present

## 2016-01-01 DIAGNOSIS — H34832 Tributary (branch) retinal vein occlusion, left eye, with macular edema: Secondary | ICD-10-CM | POA: Diagnosis not present

## 2016-01-15 ENCOUNTER — Other Ambulatory Visit: Payer: Self-pay | Admitting: Family Medicine

## 2016-01-20 DIAGNOSIS — E119 Type 2 diabetes mellitus without complications: Secondary | ICD-10-CM | POA: Diagnosis not present

## 2016-01-20 DIAGNOSIS — H2513 Age-related nuclear cataract, bilateral: Secondary | ICD-10-CM | POA: Diagnosis not present

## 2016-01-20 LAB — HM DIABETES EYE EXAM

## 2016-01-21 ENCOUNTER — Encounter: Payer: Self-pay | Admitting: *Deleted

## 2016-01-24 ENCOUNTER — Other Ambulatory Visit: Payer: Self-pay | Admitting: Family Medicine

## 2016-02-13 ENCOUNTER — Other Ambulatory Visit: Payer: Self-pay | Admitting: Family Medicine

## 2016-03-04 DIAGNOSIS — H25813 Combined forms of age-related cataract, bilateral: Secondary | ICD-10-CM | POA: Diagnosis not present

## 2016-03-04 DIAGNOSIS — H34832 Tributary (branch) retinal vein occlusion, left eye, with macular edema: Secondary | ICD-10-CM | POA: Diagnosis not present

## 2016-03-04 DIAGNOSIS — E113299 Type 2 diabetes mellitus with mild nonproliferative diabetic retinopathy without macular edema, unspecified eye: Secondary | ICD-10-CM | POA: Diagnosis not present

## 2016-03-04 DIAGNOSIS — H40003 Preglaucoma, unspecified, bilateral: Secondary | ICD-10-CM | POA: Diagnosis not present

## 2016-03-19 ENCOUNTER — Ambulatory Visit (INDEPENDENT_AMBULATORY_CARE_PROVIDER_SITE_OTHER): Payer: Medicare Other | Admitting: Family Medicine

## 2016-03-19 ENCOUNTER — Encounter: Payer: Self-pay | Admitting: Family Medicine

## 2016-03-19 VITALS — Ht 64.0 in | Wt 181.4 lb

## 2016-03-19 DIAGNOSIS — J019 Acute sinusitis, unspecified: Secondary | ICD-10-CM | POA: Diagnosis not present

## 2016-03-19 DIAGNOSIS — R3589 Other polyuria: Secondary | ICD-10-CM

## 2016-03-19 DIAGNOSIS — E119 Type 2 diabetes mellitus without complications: Secondary | ICD-10-CM

## 2016-03-19 DIAGNOSIS — I6523 Occlusion and stenosis of bilateral carotid arteries: Secondary | ICD-10-CM

## 2016-03-19 DIAGNOSIS — R358 Other polyuria: Secondary | ICD-10-CM | POA: Diagnosis not present

## 2016-03-19 DIAGNOSIS — E785 Hyperlipidemia, unspecified: Secondary | ICD-10-CM

## 2016-03-19 DIAGNOSIS — I10 Essential (primary) hypertension: Secondary | ICD-10-CM | POA: Diagnosis not present

## 2016-03-19 DIAGNOSIS — B9689 Other specified bacterial agents as the cause of diseases classified elsewhere: Secondary | ICD-10-CM

## 2016-03-19 LAB — POCT URINALYSIS DIPSTICK
Blood, UA: NEGATIVE
Leukocytes, UA: NEGATIVE
Spec Grav, UA: 1.02
pH, UA: 5

## 2016-03-19 LAB — POCT GLYCOSYLATED HEMOGLOBIN (HGB A1C): HEMOGLOBIN A1C: 6.8

## 2016-03-19 MED ORDER — AMOXICILLIN 500 MG PO TABS: 500.0000 mg | ORAL_TABLET | Freq: Three times a day (TID) | ORAL | Status: DC

## 2016-03-19 NOTE — Progress Notes (Signed)
   Subjective:    Patient ID: Kara Kirby, female    DOB: Mar 04, 1946, 70 y.o.   MRN: [id number]  Diabetes She presents for her follow-up diabetic visit. She has type 2 diabetes mellitus. Eye exam is current (April 2017).   Patient with moderate sinus symptoms. Sinus congestion drainage coughing Tries watch diet takes blood pressure medicine cholesterol medicine on a regular basis Patient denies any stroke symptoms She is tolerating her cholesterol medicine recent lab work reviewed with patient Patient does take her blood pressure medicine watches her diet tries to stay physically active Patient also has concerns of sinusitis, and lower abdominal pain.  Review of Systems Negative for chest tightness pressure pain shortness breath nausea vomiting diarrhea    Objective:   Physical Exam Lungs are clear hearts regular abdomen soft no guarding rebound tenderness extremities no edema skin warm dry  Mild sinus tenderness. Diabetic foot exam normal.     Assessment & Plan:  Diabetes under good control continue current measures Blood pressure under good control continue measures new site no sign of any type of stroke Hyperlipidemia recent lab work was reviewed with the patient continue current medications keep LDL under good control Sinusitis antibiotics prescribed warning signs discussed follow-up if problems Intermittent lower abdominal pain up-to-date on colonoscopy no rectal bleeding patient will follow this over the course of the next several weeks if it persists she will let us know otherwise follow-up on next visit

## 2016-03-27 ENCOUNTER — Other Ambulatory Visit: Payer: Self-pay | Admitting: Vascular Surgery

## 2016-03-27 ENCOUNTER — Other Ambulatory Visit: Payer: Self-pay | Admitting: Family Medicine

## 2016-04-02 ENCOUNTER — Other Ambulatory Visit: Payer: Self-pay | Admitting: Family Medicine

## 2016-04-15 ENCOUNTER — Other Ambulatory Visit: Payer: Self-pay | Admitting: Family Medicine

## 2016-04-21 ENCOUNTER — Ambulatory Visit (INDEPENDENT_AMBULATORY_CARE_PROVIDER_SITE_OTHER): Payer: Medicare Other | Admitting: Family Medicine

## 2016-04-21 ENCOUNTER — Encounter: Payer: Self-pay | Admitting: Family Medicine

## 2016-04-21 VITALS — BP 138/86 | Ht 64.0 in | Wt 181.0 lb

## 2016-04-21 DIAGNOSIS — I6523 Occlusion and stenosis of bilateral carotid arteries: Secondary | ICD-10-CM | POA: Diagnosis not present

## 2016-04-21 DIAGNOSIS — T783XXA Angioneurotic edema, initial encounter: Secondary | ICD-10-CM

## 2016-04-21 MED ORDER — TRIAMTERENE-HCTZ 37.5-25 MG PO CAPS: 1.0000 | ORAL_CAPSULE | Freq: Every day | ORAL | Status: DC

## 2016-04-21 MED ORDER — PREDNISONE 20 MG PO TABS: ORAL_TABLET | ORAL | Status: DC

## 2016-04-21 NOTE — Progress Notes (Signed)
   Subjective:    Patient ID: Kara Kirby, female    DOB: 1945/12/08, 70 y.o.   MRN: [id number]  HPI Patient arrives with c/o lip swelling since last night- Pharmacy changed her manufacturer of her lisinopril Patient is concerned that this could be the lisinopril causing this or possibly a new formulation she states she's never had this problem before but she did have a family member who had something similar patient denies any difficulty swallowing breathing wheezing. PMH benign.  Review of Systems see above.    Objective:   Physical Exam    Lungs clear heart regular throat no swelling noted in the throat she does have swelling on the left side of her tongue but her airway is good she is not respiratory distress    Assessment & Plan:   angioedema due to ACE inhibitor. Stop ACE inhibitor. Short course prednisone although this may or may not help If airway compromise immediately go to ER Although ARB medications can help with her blood pressure there are still some risk of angioedema with this therefore we will change her diuretic have her follow-up in several weeks to recheck blood pressure

## 2016-04-21 NOTE — Patient Instructions (Signed)
Stop lisinopril, hygroton, and potassium  Use dyazide(new diuretic- HCTZ/Triamterene)  Recheck 2 weeks

## 2016-04-26 ENCOUNTER — Other Ambulatory Visit: Payer: Self-pay | Admitting: Family Medicine

## 2016-05-06 ENCOUNTER — Ambulatory Visit (INDEPENDENT_AMBULATORY_CARE_PROVIDER_SITE_OTHER): Payer: Medicare Other | Admitting: Family Medicine

## 2016-05-06 ENCOUNTER — Encounter: Payer: Self-pay | Admitting: Family Medicine

## 2016-05-06 VITALS — BP 136/66 | Ht 64.0 in | Wt 179.2 lb

## 2016-05-06 DIAGNOSIS — I6523 Occlusion and stenosis of bilateral carotid arteries: Secondary | ICD-10-CM

## 2016-05-06 DIAGNOSIS — E119 Type 2 diabetes mellitus without complications: Secondary | ICD-10-CM | POA: Diagnosis not present

## 2016-05-06 DIAGNOSIS — I10 Essential (primary) hypertension: Secondary | ICD-10-CM

## 2016-05-06 DIAGNOSIS — E785 Hyperlipidemia, unspecified: Secondary | ICD-10-CM

## 2016-05-06 NOTE — Progress Notes (Signed)
   Subjective:    Patient ID: Kara Kirby, female    DOB: [birth year], 70 y.o.   MRN: [id number]  HPI Patient arrives for recheck after recent allergic reaction. Patient states she is feeling better. She denies any chest tightness pressure pain states her blood pressure under decent control denies any high fevers relates takes her medicine on a regular basis and stays physically active  Review of Systems  Constitutional: Negative for activity change, fatigue and fever.  Respiratory: Negative for cough and shortness of breath.   Cardiovascular: Negative for chest pain and leg swelling.  Neurological: Negative for headaches.       Objective:   Physical Exam  Constitutional: She appears well-nourished. No distress.  Cardiovascular: Normal rate, regular rhythm and normal heart sounds.   No murmur heard. Pulmonary/Chest: Effort normal and breath sounds normal. No respiratory distress.  Musculoskeletal: She exhibits no edema.  Lymphadenopathy:    She has no cervical adenopathy.  Neurological: She is alert. She exhibits normal muscle tone.  Psychiatric: Her behavior is normal.  Vitals reviewed.         Assessment & Plan:  Patient had ACE inhibitor angioedema this is gone away  Patient on Dyazide currently tolerating this medication well Blood pressure overall doing good Patient follow-up in the fall  Patient is due for lipid liver profile and A1c because of prediabetes previous lab work reviewed with patient Patient due for metabolic 7 given her new medications  Patient to keep appointment in September

## 2016-05-06 NOTE — Patient Instructions (Signed)
DASH Eating Plan  DASH stands for "Dietary Approaches to Stop Hypertension." The DASH eating plan is a healthy eating plan that has been shown to reduce high blood pressure (hypertension). Additional health benefits may include reducing the risk of type 2 diabetes mellitus, heart disease, and stroke. The DASH eating plan may also help with weight loss.  WHAT DO I NEED TO KNOW ABOUT THE DASH EATING PLAN?  For the DASH eating plan, you will follow these general guidelines:  · Choose foods with a percent daily value for sodium of less than 5% (as listed on the food label).  · Use salt-free seasonings or herbs instead of table salt or sea salt.  · Check with your health care provider or pharmacist before using salt substitutes.  · Eat lower-sodium products, often labeled as "lower sodium" or "no salt added."  · Eat fresh foods.  · Eat more vegetables, fruits, and low-fat dairy products.  · Choose whole grains. Look for the word "whole" as the first word in the ingredient list.  · Choose fish and skinless chicken or turkey more often than red meat. Limit fish, poultry, and meat to 6 oz (170 g) each day.  · Limit sweets, desserts, sugars, and sugary drinks.  · Choose heart-healthy fats.  · Limit cheese to 1 oz (28 g) per day.  · Eat more home-cooked food and less restaurant, buffet, and fast food.  · Limit fried foods.  · Cook foods using methods other than frying.  · Limit canned vegetables. If you do use them, rinse them well to decrease the sodium.  · When eating at a restaurant, ask that your food be prepared with less salt, or no salt if possible.  WHAT FOODS CAN I EAT?  Seek help from a dietitian for individual calorie needs.  Grains  Whole grain or whole wheat bread. Brown rice. Whole grain or whole wheat pasta. Quinoa, bulgur, and whole grain cereals. Low-sodium cereals. Corn or whole wheat flour tortillas. Whole grain cornbread. Whole grain crackers. Low-sodium crackers.  Vegetables  Fresh or frozen vegetables  (raw, steamed, roasted, or grilled). Low-sodium or reduced-sodium tomato and vegetable juices. Low-sodium or reduced-sodium tomato sauce and paste. Low-sodium or reduced-sodium canned vegetables.   Fruits  All fresh, canned (in natural juice), or frozen fruits.  Meat and Other Protein Products  Ground beef (85% or leaner), grass-fed beef, or beef trimmed of fat. Skinless chicken or turkey. Ground chicken or turkey. Pork trimmed of fat. All fish and seafood. Eggs. Dried beans, peas, or lentils. Unsalted nuts and seeds. Unsalted canned beans.  Dairy  Low-fat dairy products, such as skim or 1% milk, 2% or reduced-fat cheeses, low-fat ricotta or cottage cheese, or plain low-fat yogurt. Low-sodium or reduced-sodium cheeses.  Fats and Oils  Tub margarines without trans fats. Light or reduced-fat mayonnaise and salad dressings (reduced sodium). Avocado. Safflower, olive, or canola oils. Natural peanut or almond butter.  Other  Unsalted popcorn and pretzels.  The items listed above may not be a complete list of recommended foods or beverages. Contact your dietitian for more options.  WHAT FOODS ARE NOT RECOMMENDED?  Grains  White bread. White pasta. White rice. Refined cornbread. Bagels and croissants. Crackers that contain trans fat.  Vegetables  Creamed or fried vegetables. Vegetables in a cheese sauce. Regular canned vegetables. Regular canned tomato sauce and paste. Regular tomato and vegetable juices.  Fruits  Dried fruits. Canned fruit in light or heavy syrup. Fruit juice.  Meat and Other Protein   Products  Fatty cuts of meat. Ribs, chicken wings, bacon, sausage, bologna, salami, chitterlings, fatback, hot dogs, bratwurst, and packaged luncheon meats. Salted nuts and seeds. Canned beans with salt.  Dairy  Whole or 2% milk, cream, half-and-half, and cream cheese. Whole-fat or sweetened yogurt. Full-fat cheeses or blue cheese. Nondairy creamers and whipped toppings. Processed cheese, cheese spreads, or cheese  curds.  Condiments  Onion and garlic salt, seasoned salt, table salt, and sea salt. Canned and packaged gravies. Worcestershire sauce. Tartar sauce. Barbecue sauce. Teriyaki sauce. Soy sauce, including reduced sodium. Steak sauce. Fish sauce. Oyster sauce. Cocktail sauce. Horseradish. Ketchup and mustard. Meat flavorings and tenderizers. Bouillon cubes. Hot sauce. Tabasco sauce. Marinades. Taco seasonings. Relishes.  Fats and Oils  Butter, stick margarine, lard, shortening, ghee, and bacon fat. Coconut, palm kernel, or palm oils. Regular salad dressings.  Other  Pickles and olives. Salted popcorn and pretzels.  The items listed above may not be a complete list of foods and beverages to avoid. Contact your dietitian for more information.  WHERE CAN I FIND MORE INFORMATION?  National Heart, Lung, and [employer] Institute: www.nhlbi.nih.gov/health/health-topics/topics/dash/     This information is not intended to replace advice given to you by your health care provider. Make sure you discuss any questions you have with your health care provider.     Document Released: 09/16/2011 Document Revised: 10/18/2014 Document Reviewed: 08/01/2013  Elsevier Interactive Patient Education ©2016 Elsevier Inc.

## 2016-05-07 ENCOUNTER — Other Ambulatory Visit: Payer: Self-pay | Admitting: Vascular Surgery

## 2016-05-13 ENCOUNTER — Encounter: Payer: Self-pay | Admitting: Vascular Surgery

## 2016-05-20 ENCOUNTER — Ambulatory Visit (INDEPENDENT_AMBULATORY_CARE_PROVIDER_SITE_OTHER): Payer: Medicare Other | Admitting: Vascular Surgery

## 2016-05-20 ENCOUNTER — Encounter: Payer: Self-pay | Admitting: Vascular Surgery

## 2016-05-20 ENCOUNTER — Ambulatory Visit (HOSPITAL_COMMUNITY)
Admission: RE | Admit: 2016-05-20 | Discharge: 2016-05-20 | Disposition: A | Discharge status: Home / Self Care | Payer: Medicare Other | Source: Ambulatory Visit | Attending: Vascular Surgery | Admitting: Vascular Surgery

## 2016-05-20 VITALS — BP 138/80 | HR 78 | Temp 97.6°F | Resp 16 | Ht 64.0 in | Wt 180.0 lb

## 2016-05-20 DIAGNOSIS — I6523 Occlusion and stenosis of bilateral carotid arteries: Secondary | ICD-10-CM | POA: Diagnosis not present

## 2016-05-20 DIAGNOSIS — I10 Essential (primary) hypertension: Secondary | ICD-10-CM | POA: Insufficient documentation

## 2016-05-20 DIAGNOSIS — E785 Hyperlipidemia, unspecified: Secondary | ICD-10-CM | POA: Diagnosis not present

## 2016-05-20 DIAGNOSIS — K219 Gastro-esophageal reflux disease without esophagitis: Secondary | ICD-10-CM | POA: Diagnosis not present

## 2016-05-20 DIAGNOSIS — E119 Type 2 diabetes mellitus without complications: Secondary | ICD-10-CM | POA: Diagnosis not present

## 2016-05-20 MED ORDER — CLOPIDOGREL BISULFATE 75 MG PO TABS: 75.0000 mg | ORAL_TABLET | Freq: Every day | ORAL | 12 refills | Status: DC

## 2016-05-20 NOTE — Progress Notes (Signed)
[facility] HISTORY AND PHYSICAL    History of Present Illness:  Patient is a 70 year old female status post right carotid stent in January 2017 after prior right carotid endarterectomy in 2004. She denies any current symptoms of TIA amaurosis or stroke. 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 This resolved. She states also in 2014 she may have had a stroke but really had nonfocal symptoms. She denies any recent symptoms. She currently is on Plavix and aspirin since early December. She has a history of a contrast reaction of hives. Chronic medical problems include diabetes hypertension hyperlipidemia mild renal insufficiency. These are all currently stable. Most recent serum creatinine was normal.          Past Medical History  Diagnosis Date  . DM (diabetes mellitus) ([place])    . HTN (hypertension)    . Sleep related leg cramps    . Hyperlipidemia    . GERD (gastroesophageal reflux disease)        intermittent Sx  . Allergy    . Hematuria    . Renal insufficiency, mild    . Hip arthritis 2003      Left  . Dyslipidemia    . CVA (cerebral vascular accident) ([place]) 02-19-13  . Arthritis        Left shoulder  . Carotid artery occlusion              Past Surgical History  Procedure Laterality Date  . S/p hysterectomy      . Colonoscopy      . Total abdominal hysterectomy          FIBROID, HYPERMENORRHEA  . Colonoscopy   10/19/2011      Procedure: COLONOSCOPY;  Surgeon: Dorothyann Peng, MD;  Location: [facility];  Service: Endoscopy;  Laterality: N/A;  10:15  . Carotid endarterectomy   RIGHT      Social History     Social History  Substance Use Topics  . Smoking status: Never Smoker   . Smokeless tobacco: Never Used  . Alcohol Use: No      Family History       Family History  Problem Relation Age of Onset  . Colon cancer Neg Hx    . Colon polyps Neg Hx    . Hypertension  Mother    . Heart attack Mother    . Heart disease Mother    . Hypertension Sister    . Diabetes Sister        Amputation  . Hyperlipidemia Sister    . Heart attack Sister    . Diabetes Sister    . Hypertension Sister    . Heart disease Sister        Before age 46  . Heart disease Father        Allergies        Allergies  Allergen Reactions  . Other Hives      IV DYE      Current Outpatient Prescriptions on File Prior to Visit  Medication Sig Dispense Refill  . acetaminophen (TYLENOL) 325 MG tablet Take 325-650 mg by mouth 3 (three) times daily as needed for mild pain or headache.    Marland Kitchen amLODipine (NORVASC) 10 MG tablet TAKE ONE TABLET BY MOUTH ONCE DAILY FOR BLOOD PRESSURE 30 tablet 5  . amoxicillin (AMOXIL) 500 MG capsule TAKE ONE CAPSULE BY MOUTH THREE TIMES DAILY 30  capsule 0  . aspirin 81 MG tablet Take 81 mg by mouth daily.    Marland Kitchen atorvastatin (LIPITOR) 80 MG tablet TAKE ONE TABLET BY MOUTH ONCE DAILY 30 tablet 5  . cyclobenzaprine (FLEXERIL) 10 MG tablet TAKE ONE TABLET BY MOUTH EVERY 8 HOURS AS NEEDED FOR  SPASMS -  DO  NOT  USE  WHILE  DRIVING  - CAUTION  DROWSINESS 30 tablet 0  . doxazosin (CARDURA) 4 MG tablet TAKE ONE TABLET BY MOUTH AT BEDTIME 30 tablet 5  . fluticasone (FLONASE) 50 MCG/ACT nasal spray USE TWO SPRAY(S) IN EACH NOSTRIL ONCE DAILY 16 g 5  . glipiZIDE (GLUCOTROL) 5 MG tablet TAKE ONE TABLET BY MOUTH TWICE DAILY 60 tablet 5  . glucose blood (ONE TOUCH ULTRA TEST) test strip USE ONE STRIP TO CHECK GLUCOSE ONCE DAILY, DX- E11.9 50 each 5  . metFORMIN (GLUCOPHAGE) 500 MG tablet TAKE ONE TABLET BY MOUTH TWICE DAILY WITH A MEAL 180 tablet 0  . Multiple Vitamin (MULTIVITAMIN) tablet Take 1 tablet by mouth daily.    Marland Kitchen NITROSTAT 0.4 MG SL tablet DISSOLVE ONE TABLET UNDER THE TONGUE EVERY 5 MINUTES AS NEEDED FOR CHEST PAIN.  DO NOT EXCEED A TOTAL OF 3 DOSES IN 15 MINUTES 25 tablet 5  . ONE TOUCH ULTRA TEST test strip USE ONE STRIP TO CHECK GLUCOSE ONCE DAILY 50  each 5  . predniSONE (DELTASONE) 20 MG tablet 2 qd for 5 days 10 tablet 0  . ranitidine (ZANTAC) 300 MG tablet TAKE ONE TABLET BY MOUTH ONCE DAILY 30 tablet 5  . triamterene-hydrochlorothiazide (DYAZIDE) 37.5-25 MG capsule Take 1 each (1 capsule total) by mouth daily. 30 capsule 5  . VENTOLIN HFA 108 (90 BASE) MCG/ACT inhaler INHALE TWO PUFFS BY MOUTH EVERY 4 HOURS AS NEEDED (Patient taking differently: INHALE TWO PUFFS BY MOUTH EVERY 4 HOURS AS NEEDED for wheezing or shortness of breath) 18 each 2  . zolpidem (AMBIEN) 5 MG tablet TAKE ONE TABLET BY MOUTH AT BEDTIME AS NEEDED FOR SLEEP 30 tablet 5   No current facility-administered medications on file prior to visit.      ROS:    Neurologic: No dizziness, blackouts, seizures. No recent symptoms of stroke or mini- stroke. No recent episodes of slurred speech, or temporary blindness.   Cardiac: No recent episodes of chest pain/pressure, no shortness of breath at rest.  No shortness of breath with exertion.  Denies history of atrial fibrillation or irregular heartbeat   Vascular: No history of rest pain in feet.  No history of claudication.  No history of non-healing ulcer, No history of DVT    Pulmonary: No home oxygen, no productive cough, no hemoptysis,  No asthma or wheezing       Physical Examination   Vitals:   05/20/16 1100 05/20/16 1103  BP: 140/70 138/80  Pulse: 78   Resp: 16   Temp: 97.6 F (36.4 C)   TempSrc: Oral   SpO2: 99%   Weight: 180 lb (81.6 kg)   Height: 5\' 4"  (1.626 m)      General:  Alert and oriented, no acute distress HEENT: Normal Neck:  Soft right carotid bruit no JVD Pulmonary: Clear to auscultation bilaterally Cardiac: Regular Rate and Rhythm without murmur Skin: No rash Extremity Pulses:  2+ radial, brachial, femoral dorsalis pedis pulses bilaterally Musculoskeletal: No deformity or edema                Neurologic: Upper and lower extremity motor 5/5 and  symmetric, mild asymmetry coronary right  mouth is slightly lower than left    DATA:   carotid duplex scan was performed today which shows 40-60% left internal carotid artery stenosis widely patent stent right side   ASSESSMENT:   doing well status post right carotid stenting moderate carotid disease contralateral side. Patient will continue her Plavix and aspirin.     PLAN: Plavix 75 mg once daily. 12 refills given. Patient will follow-up in one year with carotid duplex   Ruta Hinds, MD [facility] Office: [phone] Pager: [phone]

## 2016-06-10 DIAGNOSIS — H25813 Combined forms of age-related cataract, bilateral: Secondary | ICD-10-CM | POA: Diagnosis not present

## 2016-06-10 DIAGNOSIS — H34832 Tributary (branch) retinal vein occlusion, left eye, with macular edema: Secondary | ICD-10-CM | POA: Diagnosis not present

## 2016-06-10 DIAGNOSIS — H40003 Preglaucoma, unspecified, bilateral: Secondary | ICD-10-CM | POA: Diagnosis not present

## 2016-06-10 DIAGNOSIS — E113299 Type 2 diabetes mellitus with mild nonproliferative diabetic retinopathy without macular edema, unspecified eye: Secondary | ICD-10-CM | POA: Diagnosis not present

## 2016-06-16 NOTE — Addendum Note (Signed)
Addended by: Kaleen Mask on: 06/16/2016 02:17 PM   Modules accepted: Orders

## 2016-06-21 ENCOUNTER — Other Ambulatory Visit: Payer: Self-pay | Admitting: Family Medicine

## 2016-06-21 ENCOUNTER — Telehealth: Payer: Self-pay | Admitting: Family Medicine

## 2016-06-21 MED ORDER — ALBUTEROL SULFATE HFA 108 (90 BASE) MCG/ACT IN AERS: INHALATION_SPRAY | RESPIRATORY_TRACT | 6 refills | Status: DC

## 2016-06-21 NOTE — Telephone Encounter (Signed)
This refill + 6 additional refills

## 2016-06-21 NOTE — Telephone Encounter (Signed)
Pt is needing a refill on her VENTOLIN HFA 108 (90 Base) MCG/ACT inhaler   [facility] [place]

## 2016-06-21 NOTE — Telephone Encounter (Signed)
Left message on voicemail notifying patient refill sent to pharmacy.  

## 2016-07-01 DIAGNOSIS — E785 Hyperlipidemia, unspecified: Secondary | ICD-10-CM | POA: Diagnosis not present

## 2016-07-01 DIAGNOSIS — I10 Essential (primary) hypertension: Secondary | ICD-10-CM | POA: Diagnosis not present

## 2016-07-01 DIAGNOSIS — E119 Type 2 diabetes mellitus without complications: Secondary | ICD-10-CM | POA: Diagnosis not present

## 2016-07-02 ENCOUNTER — Encounter: Payer: Self-pay | Admitting: Family Medicine

## 2016-07-02 ENCOUNTER — Ambulatory Visit (INDEPENDENT_AMBULATORY_CARE_PROVIDER_SITE_OTHER): Payer: Medicare Other | Admitting: Family Medicine

## 2016-07-02 VITALS — BP 140/80 | Ht 64.0 in | Wt 180.4 lb

## 2016-07-02 DIAGNOSIS — Z23 Encounter for immunization: Secondary | ICD-10-CM

## 2016-07-02 DIAGNOSIS — I6523 Occlusion and stenosis of bilateral carotid arteries: Secondary | ICD-10-CM | POA: Diagnosis not present

## 2016-07-02 DIAGNOSIS — E785 Hyperlipidemia, unspecified: Secondary | ICD-10-CM | POA: Diagnosis not present

## 2016-07-02 DIAGNOSIS — I10 Essential (primary) hypertension: Secondary | ICD-10-CM

## 2016-07-02 DIAGNOSIS — G4700 Insomnia, unspecified: Secondary | ICD-10-CM | POA: Diagnosis not present

## 2016-07-02 DIAGNOSIS — E119 Type 2 diabetes mellitus without complications: Secondary | ICD-10-CM

## 2016-07-02 LAB — HEPATIC FUNCTION PANEL
ALT: 22 IU/L (ref 0–32)
AST: 19 IU/L (ref 0–40)
Albumin: 4.1 g/dL (ref 3.5–4.8)
Alkaline Phosphatase: 73 IU/L (ref 39–117)
BILIRUBIN TOTAL: 0.5 mg/dL (ref 0.0–1.2)
Bilirubin, Direct: 0.13 mg/dL (ref 0.00–0.40)
Total Protein: 6.5 g/dL (ref 6.0–8.5)

## 2016-07-02 LAB — BASIC METABOLIC PANEL WITH GFR
BUN/Creatinine Ratio: 16 (ref 12–28)
BUN: 14 mg/dL (ref 8–27)
CO2: 27 mmol/L (ref 18–29)
Calcium: 9.2 mg/dL (ref 8.7–10.3)
Chloride: 101 mmol/L (ref 96–106)
Creatinine, Ser: 0.85 mg/dL (ref 0.57–1.00)
GFR calc Af Amer: 80 mL/min/{1.73_m2}
GFR calc non Af Amer: 69 mL/min/{1.73_m2}
Glucose: 151 mg/dL — ABNORMAL HIGH (ref 65–99)
Potassium: 3.6 mmol/L (ref 3.5–5.2)
Sodium: 142 mmol/L (ref 134–144)

## 2016-07-02 LAB — HEMOGLOBIN A1C
ESTIMATED AVERAGE GLUCOSE: 169 mg/dL
HEMOGLOBIN A1C: 7.5 % — AB (ref 4.8–5.6)

## 2016-07-02 LAB — LIPID PANEL
CHOL/HDL RATIO: 3.5 ratio (ref 0.0–4.4)
Cholesterol, Total: 158 mg/dL (ref 100–199)
HDL: 45 mg/dL (ref >39)
LDL Calculated: 93 mg/dL (ref 0–99)
Triglycerides: 102 mg/dL (ref 0–149)
VLDL Cholesterol Cal: 20 mg/dL (ref 5–40)

## 2016-07-02 NOTE — Progress Notes (Signed)
Subjective:    Patient ID: Kara Kirby, female    DOB: [birth year], 70 y.o.   MRN: [id number]  Diabetes  She presents for her follow-up diabetic visit. She has type 2 diabetes mellitus. There are no hypoglycemic associated symptoms. Pertinent negatives for hypoglycemia include no confusion. There are no diabetic associated symptoms. Pertinent negatives for diabetes include no chest pain, no fatigue, no polydipsia, no polyphagia and no weakness. There are no hypoglycemic complications. There are no diabetic complications. There are no known risk factors for coronary artery disease. Current diabetic treatment includes oral agent (dual therapy). She is compliant with treatment all of the time.   Results for orders placed or performed in visit on 05/06/16  Hemoglobin A1c  Result Value Ref Range   Hgb A1c MFr Bld 7.5 (H) 4.8 - 5.6 %   Est. average glucose Bld gHb Est-mCnc 169 mg/dL  Lipid panel  Result Value Ref Range   Cholesterol, Total 158 100 - 199 mg/dL   Triglycerides 102 0 - 149 mg/dL   HDL 45 >39 mg/dL   VLDL Cholesterol Cal 20 5 - 40 mg/dL   LDL Calculated 93 0 - 99 mg/dL   Chol/HDL Ratio 3.5 0.0 - 4.4 ratio units  Hepatic function panel  Result Value Ref Range   Total Protein 6.5 6.0 - 8.5 g/dL   Albumin 4.1 3.5 - 4.8 g/dL   Bilirubin Total 0.5 0.0 - 1.2 mg/dL   Bilirubin, Direct 0.13 0.00 - 0.40 mg/dL   Alkaline Phosphatase 73 39 - 117 IU/L   AST 19 0 - 40 IU/L   ALT 22 0 - 32 IU/L  Basic metabolic panel  Result Value Ref Range   Glucose 151 (H) 65 - 99 mg/dL   BUN 14 8 - 27 mg/dL   Creatinine, Ser 0.85 0.57 - 1.00 mg/dL   GFR calc non Af Amer 69 >59 mL/min/1.73   GFR calc Af Amer 80 >59 mL/min/1.73   BUN/Creatinine Ratio 16 12 - 28   Sodium 142 134 - 144 mmol/L   Potassium 3.6 3.5 - 5.2 mmol/L   Chloride 101 96 - 106 mmol/L   CO2 27 18 - 29 mmol/L   Calcium 9.2 8.7 - 10.3 mg/dL   Patient states that her great toe on her left foot is leaning more toward the  left.   Going to [place] to see her daughter  Review of Systems  Constitutional: Negative for activity change, appetite change and fatigue.  HENT: Negative for congestion.   Respiratory: Negative for cough.   Cardiovascular: Negative for chest pain.  Gastrointestinal: Negative for abdominal pain.  Endocrine: Negative for polydipsia and polyphagia.  Neurological: Negative for weakness.  Psychiatric/Behavioral: Negative for confusion.       Objective:   Physical Exam  Constitutional: She appears well-nourished. No distress.  Cardiovascular: Normal rate, regular rhythm and normal heart sounds.   No murmur heard. Pulmonary/Chest: Effort normal and breath sounds normal. No respiratory distress.  Musculoskeletal: She exhibits no edema.  Lymphadenopathy:    She has no cervical adenopathy.  Neurological: She is alert. She exhibits normal muscle tone.  Psychiatric: Her behavior is normal.  Vitals reviewed.   Insomnia stable  25 minutes was spent with the patient. Greater than half the time was spent in discussion and answering questions and counseling regarding the issues that the patient came in for today.    recheck A1c in approximately 4 months Assessment & Plan:  Diabetes subpar control long discussion  held about proper diet regular physical activity try to get the A1c better if not we will need to initiate more medications  Patient does have some toe pain but I find no evidence of neuropathy  Hyperlipidemia previous lab work reviewed patient encourage watch diet  Insomnia uses Ambien this helps her

## 2016-07-12 ENCOUNTER — Other Ambulatory Visit: Payer: Self-pay | Admitting: Family Medicine

## 2016-07-12 DIAGNOSIS — Z1231 Encounter for screening mammogram for malignant neoplasm of breast: Secondary | ICD-10-CM

## 2016-07-14 ENCOUNTER — Ambulatory Visit (HOSPITAL_COMMUNITY): Payer: Medicare Other

## 2016-07-15 ENCOUNTER — Other Ambulatory Visit: Payer: Self-pay | Admitting: Family Medicine

## 2016-07-15 ENCOUNTER — Ambulatory Visit: Payer: Medicare Other | Admitting: Family Medicine

## 2016-07-15 ENCOUNTER — Ambulatory Visit (HOSPITAL_COMMUNITY)
Admission: RE | Admit: 2016-07-15 | Discharge: 2016-07-15 | Disposition: A | Discharge status: Home / Self Care | Payer: Medicare Other | Source: Ambulatory Visit | Attending: Family Medicine | Admitting: Family Medicine

## 2016-07-15 DIAGNOSIS — Z1231 Encounter for screening mammogram for malignant neoplasm of breast: Secondary | ICD-10-CM

## 2016-07-20 ENCOUNTER — Other Ambulatory Visit: Payer: Self-pay | Admitting: Family Medicine

## 2016-08-18 ENCOUNTER — Other Ambulatory Visit: Payer: Self-pay | Admitting: Family Medicine

## 2016-09-16 ENCOUNTER — Other Ambulatory Visit: Payer: Self-pay | Admitting: Family Medicine

## 2016-09-19 ENCOUNTER — Other Ambulatory Visit: Payer: Self-pay | Admitting: Family Medicine

## 2016-09-23 DIAGNOSIS — H25813 Combined forms of age-related cataract, bilateral: Secondary | ICD-10-CM | POA: Diagnosis not present

## 2016-09-23 DIAGNOSIS — E113299 Type 2 diabetes mellitus with mild nonproliferative diabetic retinopathy without macular edema, unspecified eye: Secondary | ICD-10-CM | POA: Diagnosis not present

## 2016-09-23 DIAGNOSIS — H468 Other optic neuritis: Secondary | ICD-10-CM | POA: Insufficient documentation

## 2016-09-23 DIAGNOSIS — H34832 Tributary (branch) retinal vein occlusion, left eye, with macular edema: Secondary | ICD-10-CM | POA: Diagnosis not present

## 2016-09-23 DIAGNOSIS — H47012 Ischemic optic neuropathy, left eye: Secondary | ICD-10-CM | POA: Diagnosis not present

## 2016-09-25 ENCOUNTER — Other Ambulatory Visit: Payer: Self-pay | Admitting: Family Medicine

## 2016-09-27 ENCOUNTER — Other Ambulatory Visit: Payer: Self-pay | Admitting: Family Medicine

## 2016-09-27 ENCOUNTER — Ambulatory Visit (INDEPENDENT_AMBULATORY_CARE_PROVIDER_SITE_OTHER): Payer: Medicare Other | Admitting: Family Medicine

## 2016-09-27 VITALS — BP 132/84 | HR 89 | Temp 98.8°F | Wt 182.0 lb

## 2016-09-27 DIAGNOSIS — J019 Acute sinusitis, unspecified: Secondary | ICD-10-CM

## 2016-09-27 DIAGNOSIS — I6523 Occlusion and stenosis of bilateral carotid arteries: Secondary | ICD-10-CM

## 2016-09-27 DIAGNOSIS — B9689 Other specified bacterial agents as the cause of diseases classified elsewhere: Secondary | ICD-10-CM

## 2016-09-27 MED ORDER — AMOXICILLIN 500 MG PO TABS: 500.0000 mg | ORAL_TABLET | Freq: Three times a day (TID) | ORAL | 0 refills | Status: DC

## 2016-09-27 NOTE — Telephone Encounter (Signed)
May have this +3 refills 

## 2016-09-27 NOTE — Progress Notes (Signed)
   Subjective:    Patient ID: Kara Kirby, female    DOB: 18-Jun-1946, 70 y.o.   MRN: [id number]  HPI   Patient in office today c/o throat pain, cough, SOB, sore chest, and coughing up mucous and blood together since the end of last week.  She states Mucus Relief helps some. Mild sinus pain and congestion Some chest congestion Some wheeze No fever but some chills No n or v Slight doe Increased coughing at night  Review of Systems  Constitutional: Negative for activity change and fever.  HENT: Positive for congestion and rhinorrhea. Negative for ear pain.   Eyes: Negative for discharge.  Respiratory: Positive for cough. Negative for shortness of breath and wheezing.   Cardiovascular: Negative for chest pain.       Objective:   Physical Exam  Constitutional: She appears well-developed.  HENT:  Head: Normocephalic.  Nose: Nose normal.  Mouth/Throat: Oropharynx is clear and moist. No oropharyngeal exudate.  Neck: Neck supple.  Cardiovascular: Normal rate and normal heart sounds.   No murmur heard. Pulmonary/Chest: Effort normal and breath sounds normal. She has no wheezes.  Lymphadenopathy:    She has no cervical adenopathy.  Skin: Skin is warm and dry.  Nursing note and vitals reviewed.    No pneumonia     Assessment & Plan:  Patient was seen today for upper respiratory illness. It is felt that the patient is dealing with sinusitis. Antibiotics were prescribed today. Importance of compliance with medication was discussed. Symptoms should gradually resolve over the course of the next several days. If high fevers, progressive illness, difficulty breathing, worsening condition or failure for symptoms to improve over the next several days then the patient is to follow-up. If any emergent conditions the patient is to follow-up in the emergency department otherwise to follow-up in the office.

## 2016-10-05 ENCOUNTER — Other Ambulatory Visit: Payer: Self-pay | Admitting: Family Medicine

## 2016-10-12 ENCOUNTER — Telehealth: Payer: Self-pay | Admitting: Family Medicine

## 2016-10-12 NOTE — Telephone Encounter (Signed)
Pt is wanting to know if phenylalanine is the ingredient that Dr. Nicki Reaper told her not to take. Pt was given a bene fiber drink mix and noticed on the back afterwards that the phenylalanine was in it. Please advise.

## 2016-10-13 NOTE — Telephone Encounter (Signed)
Phenylephrine which is a decongestant is the ingredient I would never want her to take typically those are found in some multisymptom cold medicines like TheraFlu. The ingredient she speaks of is what is in a lot of artificial sweeteners which is fine for her to use

## 2016-10-13 NOTE — Telephone Encounter (Signed)
Patient aware and given the spelling of the name of ingredient that she should avoid.

## 2016-10-13 NOTE — Telephone Encounter (Signed)
Left message to return call 

## 2016-10-26 ENCOUNTER — Other Ambulatory Visit: Payer: Self-pay | Admitting: Family Medicine

## 2016-11-02 ENCOUNTER — Encounter: Payer: Self-pay | Admitting: Family Medicine

## 2016-11-02 ENCOUNTER — Ambulatory Visit (INDEPENDENT_AMBULATORY_CARE_PROVIDER_SITE_OTHER): Payer: Medicare Other | Admitting: Family Medicine

## 2016-11-02 VITALS — BP 122/82 | Ht 64.0 in | Wt 181.8 lb

## 2016-11-02 DIAGNOSIS — L989 Disorder of the skin and subcutaneous tissue, unspecified: Secondary | ICD-10-CM | POA: Diagnosis not present

## 2016-11-02 DIAGNOSIS — E119 Type 2 diabetes mellitus without complications: Secondary | ICD-10-CM | POA: Diagnosis not present

## 2016-11-02 DIAGNOSIS — I10 Essential (primary) hypertension: Secondary | ICD-10-CM | POA: Diagnosis not present

## 2016-11-02 DIAGNOSIS — E784 Other hyperlipidemia: Secondary | ICD-10-CM

## 2016-11-02 DIAGNOSIS — E7849 Other hyperlipidemia: Secondary | ICD-10-CM

## 2016-11-02 LAB — POCT GLYCOSYLATED HEMOGLOBIN (HGB A1C): Hemoglobin A1C: 5.5

## 2016-11-02 MED ORDER — GLIPIZIDE 5 MG PO TABS: ORAL_TABLET | ORAL | 1 refills | Status: DC

## 2016-11-02 NOTE — Progress Notes (Signed)
   Subjective:    Patient ID: Kara Kirby, female    DOB: Nov 04, 1945, 71 y.o.   MRN: [id number]  Diabetes  She presents for her follow-up diabetic visit. She has type 2 diabetes mellitus. Pertinent negatives for hypoglycemia include no confusion. Pertinent negatives for diabetes include no chest pain, no fatigue, no polydipsia, no polyphagia and no weakness. Risk factors for coronary artery disease include diabetes mellitus, dyslipidemia and post-menopausal. Current diabetic treatment includes oral agent (dual therapy). She is compliant with treatment all of the time.   Patient has bump in her head and would like to discuss  Patient does relate head congestion drainage sinus symptoms. Patient has hyperlipidemia takes her medication previous labs reviewed with patient She has chronic allergies uses Flonase She does have blood pressure issues takes her medicine without problems watching salt diet staying physically active doing dance class Patient denies any flareups of wheezing She denies any stroke symptoms in Has seen ophthalmology  Review of Systems  Constitutional: Negative for activity change, appetite change and fatigue.  HENT: Negative for congestion.   Respiratory: Negative for cough.   Cardiovascular: Negative for chest pain.  Gastrointestinal: Negative for abdominal pain.  Endocrine: Negative for polydipsia and polyphagia.  Neurological: Negative for weakness.  Psychiatric/Behavioral: Negative for confusion.       Objective:   Physical Exam  Constitutional: She appears well-nourished. No distress.  Cardiovascular: Normal rate, regular rhythm and normal heart sounds.   No murmur heard. Pulmonary/Chest: Effort normal and breath sounds normal. No respiratory distress.  Musculoskeletal: She exhibits no edema.  Lymphadenopathy:    She has no cervical adenopathy.  Neurological: She is alert. She exhibits normal muscle tone.  Psychiatric: Her behavior is normal.    Vitals reviewed.  The area on the top of the head looks like a seborrheic keratosis versus the possibility of atypical nevi       Assessment & Plan:  Spot on her scalp hyperpigmented. Needs to see dermatology possible biopsy could be a seborrheic keratosis but also could be abnormal nevus  Diabetes back down on medicine watch for low sugar spells follow-up in 3-4 months  HTN good control continue current measures  Sinus congestion no sign of infection hold off on antibiotics  Hyperlipidemia previous labs reviewed continue current medicine watch diet  Follow-up as planned in 3-4 months

## 2016-11-11 ENCOUNTER — Other Ambulatory Visit: Payer: Self-pay | Admitting: Family Medicine

## 2016-11-15 ENCOUNTER — Other Ambulatory Visit: Payer: Self-pay

## 2016-11-15 ENCOUNTER — Other Ambulatory Visit: Payer: Self-pay | Admitting: *Deleted

## 2016-11-15 ENCOUNTER — Telehealth: Payer: Self-pay | Admitting: Family Medicine

## 2016-11-15 DIAGNOSIS — L658 Other specified nonscarring hair loss: Secondary | ICD-10-CM | POA: Diagnosis not present

## 2016-11-15 DIAGNOSIS — L820 Inflamed seborrheic keratosis: Secondary | ICD-10-CM | POA: Diagnosis not present

## 2016-11-15 DIAGNOSIS — L218 Other seborrheic dermatitis: Secondary | ICD-10-CM | POA: Diagnosis not present

## 2016-11-15 MED ORDER — BLOOD GLUCOSE MONITOR KIT: PACK | 0 refills | Status: DC

## 2016-11-15 MED ORDER — GLUCOSE BLOOD VI STRP: ORAL_STRIP | 5 refills | Status: DC

## 2016-11-15 NOTE — Telephone Encounter (Signed)
error 

## 2016-11-16 ENCOUNTER — Other Ambulatory Visit: Payer: Self-pay | Admitting: *Deleted

## 2016-11-16 MED ORDER — GLUCOSE BLOOD VI STRP: ORAL_STRIP | 5 refills | Status: DC

## 2016-11-25 ENCOUNTER — Ambulatory Visit: Payer: Medicare Other | Admitting: Vascular Surgery

## 2016-11-25 ENCOUNTER — Encounter (HOSPITAL_COMMUNITY): Payer: Medicare Other

## 2016-12-11 ENCOUNTER — Other Ambulatory Visit: Payer: Self-pay | Admitting: Family Medicine

## 2016-12-13 ENCOUNTER — Other Ambulatory Visit: Payer: Self-pay | Admitting: Family Medicine

## 2016-12-13 NOTE — Telephone Encounter (Signed)
Patient is requesting Rx for 90 day supply.

## 2016-12-15 ENCOUNTER — Other Ambulatory Visit: Payer: Self-pay

## 2016-12-15 MED ORDER — TRIAMTERENE-HCTZ 37.5-25 MG PO TABS: ORAL_TABLET | ORAL | 1 refills | Status: DC

## 2016-12-23 DIAGNOSIS — E113292 Type 2 diabetes mellitus with mild nonproliferative diabetic retinopathy without macular edema, left eye: Secondary | ICD-10-CM | POA: Diagnosis not present

## 2016-12-23 DIAGNOSIS — H47012 Ischemic optic neuropathy, left eye: Secondary | ICD-10-CM | POA: Diagnosis not present

## 2016-12-23 DIAGNOSIS — H25813 Combined forms of age-related cataract, bilateral: Secondary | ICD-10-CM | POA: Diagnosis not present

## 2016-12-23 DIAGNOSIS — H34832 Tributary (branch) retinal vein occlusion, left eye, with macular edema: Secondary | ICD-10-CM | POA: Diagnosis not present

## 2016-12-27 ENCOUNTER — Telehealth: Payer: Self-pay | Admitting: Family Medicine

## 2016-12-27 DIAGNOSIS — E119 Type 2 diabetes mellitus without complications: Secondary | ICD-10-CM

## 2016-12-27 NOTE — Telephone Encounter (Signed)
Kara Kirby received a call from [facility] today asking to see her because Dr. Nicki Reaper instructed her to.  Kara Kirby is needing a referral put in the system for this.

## 2016-12-27 NOTE — Telephone Encounter (Signed)
So this was part of the 3 classes advertised by the hospital I had no idea referral was necessary for free class-please put in referral for nutritional services then let Brendale forward from there

## 2016-12-27 NOTE — Telephone Encounter (Signed)
Referral ordered in EPIC. 

## 2016-12-30 ENCOUNTER — Encounter: Payer: Medicare Other | Attending: Family Medicine | Admitting: Nutrition

## 2016-12-30 VITALS — Ht 64.0 in | Wt 184.0 lb

## 2016-12-30 DIAGNOSIS — E119 Type 2 diabetes mellitus without complications: Secondary | ICD-10-CM | POA: Diagnosis not present

## 2016-12-30 DIAGNOSIS — Z713 Dietary counseling and surveillance: Secondary | ICD-10-CM | POA: Diagnosis not present

## 2016-12-30 NOTE — Progress Notes (Signed)
Medical Nutrition Therapy:  Appt start time: 1430 end time:  7096.  Assessment:  Primary concerns today: Diabetes Type 2.. Eats out some and cooks  baked and fried. Eats 3 meals a day..  FBS in am 109 Evening 137-180 mg/dl. A1C 5.5%. Admits to night sweats at times and light headed sometimes. Glipizide 5 mg in am and 2.5 mg at dinner. (Glipizide was just reduced to 2.5 mg at night she notes).  Metformin 500mg  BID. PMH: [place].  She notes her BS has been below 70 sometimes at night and in  early am. Diet is inconsistent of carbs at times causing low blood sugars possible in the middle of night.  Diet needs more vegetables and fresh fruits with meals. Needs to eat 2-3 carb choices per meal with protein for blood sugar stability.  A1C was 7.5% 06/2016 and now is down to 5.5% 10/2016.    Lab Results  Component Value Date   HGBA1C 5.5 11/02/2016   Lipid Panel     Component Value Date/Time   CHOL 158 07/01/2016 0825   TRIG 102 07/01/2016 0825   HDL 45 07/01/2016 0825   CHOLHDL 3.5 07/01/2016 0825   CHOLHDL 3.3 05/27/2014 1041   VLDL 16 05/27/2014 1041   LDLCALC 93 07/01/2016 0825   CMP Latest Ref Rng & Units 07/01/2016 11/21/2015 10/18/2015  Glucose 65 - 99 mg/dL 151(H) 156(H) 244(H)  BUN 8 - 27 mg/dL 14 13 14   Creatinine 0.57 - 1.00 mg/dL 0.85 0.82 0.87  Sodium 134 - 144 mmol/L 142 142 139  Potassium 3.5 - 5.2 mmol/L 3.6 3.7 3.0(L)  Chloride 96 - 106 mmol/L 101 100 105  CO2 18 - 29 mmol/L 27 24 26   Calcium 8.7 - 10.3 mg/dL 9.2 9.7 8.6(L)  Total Protein 6.0 - 8.5 g/dL 6.5 - -  Total Bilirubin 0.0 - 1.2 mg/dL 0.5 - -  Alkaline Phos 39 - 117 IU/L 73 - -  AST 0 - 40 IU/L 19 - -  ALT 0 - 32 IU/L 22 - -     Preferred Learning Style:  No preference indicated   Learning Readiness:   Ready  Change in progress  MEDICATIONS:   DIETARY INTAKE:  24-hr recall:  B ( AM):Corn flakes or oatmeal plan, 1 cup, raisin, water or hot tea Snk ( AM):  L ( PM): Bologna on wheat bread,  water  Snk ( PM): none D ( PM): Pintos,  [place] neck and bread 2 slices bread, water or Diet Sprite Snk ( PM): potato chips Beverages: water  Usual physical activity:  Walking 30 minutes and dance class, yoga,   Estimated energy needs: 1200  calories 135  g carbohydrates 90 g protein 33 g fat  Progress Towards Goal(s):  In progress.   Nutritional Diagnosis:  NB-1.1 Food and nutrition-related knowledge deficit As related to Diabetes.  As evidenced by A1C > 7.5% .    Intervention:  Nutrition and Diabetes education provided on My Plate, CHO counting, meal planning, portion sizes, timing of meals, avoiding snacks between meals unless having a low blood sugar, target ranges for A1C and blood sugars, signs/symptoms and treatment of hyper/hypoglycemia, monitoring blood sugars, taking medications as prescribed, benefits of exercising 30 minutes per day and prevention of complications of DM.  Goals 1. Follow Plate Method 2. Eat meals on time 3. Eat 2-3 carb choices per meal 4. Increase vegetables and fruit with meals 5. Prevent low blood sugar (BS less than 80 for your age) Test blood  sugar before breakfast and bedtime a few times per week. Make sure blood sugar is above 100 mg/dl before bedtime. If blood sugar is less than 70-80, drink 4 oz juice, tea, or soda. Wait 15 minutes and recheck blood sugar. IF BS is less than 100 before bed, eat 1/2 peanut butter sandwich or 1 cup yogurt or pb and crackers and make sure blood sugar is above 100 mg/dl before you go to bed. IF BS are below 80's, let Dr. Wolfgang Phoenix know to see if he wants to reduce or stop your Glipizide.   Teaching Method Utilized:  Visual Auditory Hands on  Handouts given during visit include:  The Plate Method  Meal Plan Card  Barriers to learning/adherence to lifestyle change: none  Demonstrated degree of understanding via:  Teach Back   Monitoring/Evaluation:  Dietary intake, exercise, meal planning, SBG, and body  weight in 1 month(s). She may benefit from stopping Glipizide if BS continue to drop at night or between meals. May would benefit from increasing Metformin to 1000 mg BID with good kidney function.

## 2016-12-30 NOTE — Patient Instructions (Addendum)
Goals 1. Follow Plate Method 2. Eat meals on time 3. Eat 2-3 carb choices per meal 4. Increase vegetables and fruit with meals 5. Prevent low blood sugar (BS less than 80 for your age) Test blood sugar before breakfast and bedtime a few times per week. Make sure blood sugar is above 100 mg/dl before bedtime. If blood sugar is less than 70-80, drink 4 oz juice, tea, or soda. Wait 15 minutes and recheck blood sugar. IF BS is less than 100 before bed, eat 1/2 peanut butter sandwich or 1 cup yogurt or pb and crackers and make sure blood sugar is above 100 mg/dl before you go to bed. IF BS are below 80's, let Dr. Wolfgang Phoenix know to see if he wants to reduce or stop your Glipizide.

## 2017-01-10 ENCOUNTER — Telehealth: Payer: Self-pay | Admitting: Nutrition

## 2017-01-10 NOTE — Telephone Encounter (Signed)
TC from patient inquiring what to do if her BS was 160 mg/dl in am or if it was low ,70 mg/dl or less. Reviewed treatments for hyper/hypoglycemia and when to call MD to discuss medications. Discussed importance of balanced meals of carbs and protein, timing of meals and avoiding skipping meals. She verbalized understanding. Advised if she has low blood sugars in am, at night or between meals, to let Dr. Francetta Found know so he can decide if he wants to adjust any medications. She verbalized understanding. She is on 500 mg of Metformin BID and Glipizide 5 mg in am and 1/2 tablet at night.

## 2017-01-19 ENCOUNTER — Other Ambulatory Visit: Payer: Self-pay | Admitting: Family Medicine

## 2017-01-24 ENCOUNTER — Other Ambulatory Visit: Payer: Self-pay | Admitting: Family Medicine

## 2017-01-25 ENCOUNTER — Encounter: Payer: Self-pay | Admitting: Family Medicine

## 2017-01-25 DIAGNOSIS — H5213 Myopia, bilateral: Secondary | ICD-10-CM | POA: Diagnosis not present

## 2017-01-25 DIAGNOSIS — H2513 Age-related nuclear cataract, bilateral: Secondary | ICD-10-CM | POA: Insufficient documentation

## 2017-01-25 DIAGNOSIS — H524 Presbyopia: Secondary | ICD-10-CM | POA: Diagnosis not present

## 2017-01-25 DIAGNOSIS — H34832 Tributary (branch) retinal vein occlusion, left eye, with macular edema: Secondary | ICD-10-CM | POA: Diagnosis not present

## 2017-01-25 DIAGNOSIS — E119 Type 2 diabetes mellitus without complications: Secondary | ICD-10-CM | POA: Diagnosis not present

## 2017-01-25 DIAGNOSIS — H52203 Unspecified astigmatism, bilateral: Secondary | ICD-10-CM | POA: Diagnosis not present

## 2017-01-25 LAB — HM DIABETES EYE EXAM

## 2017-02-03 ENCOUNTER — Encounter: Payer: Medicare Other | Attending: Family Medicine | Admitting: Nutrition

## 2017-02-03 VITALS — Ht 64.0 in | Wt 184.0 lb

## 2017-02-03 DIAGNOSIS — E119 Type 2 diabetes mellitus without complications: Secondary | ICD-10-CM | POA: Diagnosis not present

## 2017-02-03 DIAGNOSIS — Z713 Dietary counseling and surveillance: Secondary | ICD-10-CM | POA: Insufficient documentation

## 2017-02-03 DIAGNOSIS — E669 Obesity, unspecified: Secondary | ICD-10-CM

## 2017-02-03 NOTE — Progress Notes (Signed)
  Medical Nutrition Therapy:  Appt start time: 1430 end time:  3299.  Assessment:  Primary concerns today: Diabetes Type 2.  Getting up for breakfast.  Exericising by dancing, yard work, yoga. Blood sugars are fantastic. Denies any low blood sugars now. Still taking Metformin 500 mg BID and Glipizide 5 mg in am and 2.5 mg in pm. Eating better balanced meals. Needs more lower carb vegetables.    Lab Results  Component Value Date   HGBA1C 5.5 11/02/2016   Lipid Panel     Component Value Date/Time   CHOL 158 07/01/2016 0825   TRIG 102 07/01/2016 0825   HDL 45 07/01/2016 0825   CHOLHDL 3.5 07/01/2016 0825   CHOLHDL 3.3 05/27/2014 1041   VLDL 16 05/27/2014 1041   LDLCALC 93 07/01/2016 0825   CMP Latest Ref Rng & Units 07/01/2016 11/21/2015 10/18/2015  Glucose 65 - 99 mg/dL 151(H) 156(H) 244(H)  BUN 8 - 27 mg/dL 14 13 14   Creatinine 0.57 - 1.00 mg/dL 0.85 0.82 0.87  Sodium 134 - 144 mmol/L 142 142 139  Potassium 3.5 - 5.2 mmol/L 3.6 3.7 3.0(L)  Chloride 96 - 106 mmol/L 101 100 105  CO2 18 - 29 mmol/L 27 24 26   Calcium 8.7 - 10.3 mg/dL 9.2 9.7 8.6(L)  Total Protein 6.0 - 8.5 g/dL 6.5 - -  Total Bilirubin 0.0 - 1.2 mg/dL 0.5 - -  Alkaline Phos 39 - 117 IU/L 73 - -  AST 0 - 40 IU/L 19 - -  ALT 0 - 32 IU/L 22 - -     Preferred Learning Style:  No preference indicated   Learning Readiness:   Ready  Change in progress  MEDICATIONS:   DIETARY INTAKE:  24-hr recall:  B ( AM): oatmeal plan, 1 cup,  1 egg and  water or hot tea Snk ( AM):  L ( PM): [place] with bun, string beans and small potato,  Snk ( PM): none D ( PM): Pintos,  [place] neck and bread 2 slices bread, water or Diet Sprite Snk ( PM): potato chips Beverages: water  Usual physical activity:  Walking 30 minutes and dance class, yoga,   Estimated energy needs: 1200  calories 135  g carbohydrates 90 g protein 33 g fat  Progress Towards Goal(s):  In progress.   Nutritional Diagnosis:  NB-1.1 Food  and nutrition-related knowledge deficit As related to Diabetes.  As evidenced by A1C > 7.5% .    Intervention:  Nutrition and Diabetes education provided on My Plate, CHO counting, meal planning, portion sizes, timing of meals, avoiding snacks between meals unless having a low blood sugar, target ranges for A1C and blood sugars, signs/symptoms and treatment of hyper/hypoglycemia, monitoring blood sugars, taking medications as prescribed, benefits of exercising 30 minutes per day and prevention of complications of DM.  Goals 1. Keep up the great job! 2. Continue to follow Plate Method 3.Keep exercising daily. 4. Prevent low blood sugars Keep A1C less than 6.5%.  Teaching Method Utilized:  Visual Auditory Hands on  Handouts given during visit include:  The Plate Method  Meal Plan Card  Barriers to learning/adherence to lifestyle change: none  Demonstrated degree of understanding via:  Teach Back   Monitoring/Evaluation:  Dietary intake, exercise, meal planning, SBG, and body weight in 3 month(s).

## 2017-02-03 NOTE — Patient Instructions (Signed)
Goals 1. Keep up the great job! 2. Continue to follow Plate Method 3.Keep exercising daily. 4. Prevent low blood sugars Keep A1C less than 6.5%.

## 2017-02-04 ENCOUNTER — Ambulatory Visit (HOSPITAL_COMMUNITY)
Admission: RE | Admit: 2017-02-04 | Discharge: 2017-02-04 | Disposition: A | Discharge status: Home / Self Care | Payer: Medicare Other | Source: Ambulatory Visit | Attending: Family Medicine | Admitting: Family Medicine

## 2017-02-04 ENCOUNTER — Encounter: Payer: Self-pay | Admitting: Family Medicine

## 2017-02-04 ENCOUNTER — Ambulatory Visit (INDEPENDENT_AMBULATORY_CARE_PROVIDER_SITE_OTHER): Payer: Medicare Other | Admitting: Family Medicine

## 2017-02-04 VITALS — BP 120/82 | Ht 64.0 in | Wt 183.0 lb

## 2017-02-04 DIAGNOSIS — I10 Essential (primary) hypertension: Secondary | ICD-10-CM | POA: Diagnosis not present

## 2017-02-04 DIAGNOSIS — J301 Allergic rhinitis due to pollen: Secondary | ICD-10-CM | POA: Diagnosis not present

## 2017-02-04 DIAGNOSIS — E784 Other hyperlipidemia: Secondary | ICD-10-CM

## 2017-02-04 DIAGNOSIS — J4530 Mild persistent asthma, uncomplicated: Secondary | ICD-10-CM | POA: Diagnosis not present

## 2017-02-04 DIAGNOSIS — G4700 Insomnia, unspecified: Secondary | ICD-10-CM | POA: Diagnosis not present

## 2017-02-04 DIAGNOSIS — E7849 Other hyperlipidemia: Secondary | ICD-10-CM

## 2017-02-04 DIAGNOSIS — E119 Type 2 diabetes mellitus without complications: Secondary | ICD-10-CM | POA: Diagnosis not present

## 2017-02-04 DIAGNOSIS — R05 Cough: Secondary | ICD-10-CM | POA: Diagnosis not present

## 2017-02-04 LAB — POCT GLYCOSYLATED HEMOGLOBIN (HGB A1C): HEMOGLOBIN A1C: 6.6

## 2017-02-04 MED ORDER — GLIPIZIDE 5 MG PO TABS: ORAL_TABLET | ORAL | 1 refills | Status: DC

## 2017-02-04 MED ORDER — CLOPIDOGREL BISULFATE 75 MG PO TABS: 75.0000 mg | ORAL_TABLET | Freq: Every day | ORAL | 3 refills | Status: DC

## 2017-02-04 MED ORDER — TRIAMTERENE-HCTZ 37.5-25 MG PO TABS: ORAL_TABLET | ORAL | 3 refills | Status: DC

## 2017-02-04 NOTE — Progress Notes (Signed)
   Subjective:    Patient ID: Kara Kirby, female    DOB: [birth year], 71 y.o.   MRN: [id number]  Diabetes  She presents for her follow-up diabetic visit. She has type 2 diabetes mellitus. There are no hypoglycemic associated symptoms. There are no diabetic associated symptoms. Pertinent negatives for diabetes include no chest pain. There are no hypoglycemic complications. There are no diabetic complications. There are no known risk factors for coronary artery disease. Current diabetic treatment includes oral agent (dual therapy). She is compliant with treatment all of the time.   Patient has drainage, sore throat and congestion. Onset several days ago.  Patient with allergy symptoms congestion coughing She does have intermittent wheezing issues uses albuterol couple times at night denies severe shortness of breath Moderate insomnia medicine helps Hyperlipidemia previous labs reviewed taking her medication watching her diet Hopes to get back in exercise Blood pressure under decent control watch his salt the diet 25 minutes was spent with the patient. Greater than half the time was spent in discussion and answering questions and counseling regarding the issues that the patient came in for today.   Review of Systems  Constitutional: Negative for activity change and fever.  HENT: Positive for congestion and rhinorrhea. Negative for ear pain.   Eyes: Negative for discharge.  Respiratory: Positive for cough. Negative for shortness of breath and wheezing.   Cardiovascular: Negative for chest pain.       Objective:   Physical Exam  Constitutional: She appears well-developed.  HENT:  Head: Normocephalic.  Nose: Nose normal.  Mouth/Throat: Oropharynx is clear and moist. No oropharyngeal exudate.  Neck: Neck supple.  Cardiovascular: Normal rate and normal heart sounds.   No murmur heard. Pulmonary/Chest: Effort normal and breath sounds normal. She has no wheezes.  Lymphadenopathy:    She has no cervical adenopathy.  Skin: Skin is warm and dry.  Nursing note and vitals reviewed.         Assessment & Plan:  Diabetes-her A1c is 6.6. According to the latest guidelines this is a safer zone for this patient to be and given that she is on glipizide. We will not increase the dose at this point watch diet stay physically active keep weight under control  Hyperlipidemia previous labs reviewed continue current measures  Skin good blood pressure readings continue current measures refills given  Allergies allergic rhinitis continue Flonase allergy tablet follow-up of problems no sinus sinusitis  Reactive airway-no sign of any major compromise but I recommend chest x-ray pulmonary function testing pre-and post may need to be on inhaled steroid as well.  Follow-up 4-5 months

## 2017-02-05 LAB — BASIC METABOLIC PANEL
BUN/Creatinine Ratio: 18 (ref 12–28)
BUN: 17 mg/dL (ref 8–27)
CALCIUM: 9.4 mg/dL (ref 8.7–10.3)
CO2: 25 mmol/L (ref 18–29)
CREATININE: 0.96 mg/dL (ref 0.57–1.00)
Chloride: 100 mmol/L (ref 96–106)
GFR calc Af Amer: 69 mL/min/{1.73_m2} (ref >59)
GFR calc non Af Amer: 60 mL/min/{1.73_m2} (ref >59)
GLUCOSE: 125 mg/dL — AB (ref 65–99)
POTASSIUM: 3.4 mmol/L — AB (ref 3.5–5.2)
SODIUM: 142 mmol/L (ref 134–144)

## 2017-02-05 LAB — LIPID PANEL
CHOLESTEROL TOTAL: 153 mg/dL (ref 100–199)
Chol/HDL Ratio: 3.8 ratio (ref 0.0–4.4)
HDL: 40 mg/dL (ref >39)
LDL Calculated: 85 mg/dL (ref 0–99)
Triglycerides: 142 mg/dL (ref 0–149)
VLDL Cholesterol Cal: 28 mg/dL (ref 5–40)

## 2017-02-05 LAB — HEPATIC FUNCTION PANEL
ALBUMIN: 4.3 g/dL (ref 3.5–4.8)
ALT: 24 IU/L (ref 0–32)
AST: 17 IU/L (ref 0–40)
Alkaline Phosphatase: 72 IU/L (ref 39–117)
Bilirubin Total: 0.5 mg/dL (ref 0.0–1.2)
Bilirubin, Direct: 0.12 mg/dL (ref 0.00–0.40)
TOTAL PROTEIN: 6.5 g/dL (ref 6.0–8.5)

## 2017-02-05 LAB — HEMOGLOBIN A1C
ESTIMATED AVERAGE GLUCOSE: 151 mg/dL
Hgb A1c MFr Bld: 6.9 % — ABNORMAL HIGH (ref 4.8–5.6)

## 2017-02-05 LAB — MICROALBUMIN / CREATININE URINE RATIO
CREATININE, UR: 60.8 mg/dL
Microalb/Creat Ratio: 89.1 mg/g creat — ABNORMAL HIGH (ref 0.0–30.0)
Microalbumin, Urine: 54.2 ug/mL

## 2017-02-06 ENCOUNTER — Encounter: Payer: Self-pay | Admitting: Family Medicine

## 2017-02-07 ENCOUNTER — Other Ambulatory Visit: Payer: Self-pay | Admitting: *Deleted

## 2017-02-07 DIAGNOSIS — I10 Essential (primary) hypertension: Secondary | ICD-10-CM

## 2017-02-07 DIAGNOSIS — E119 Type 2 diabetes mellitus without complications: Secondary | ICD-10-CM

## 2017-02-11 ENCOUNTER — Ambulatory Visit (HOSPITAL_COMMUNITY)
Admission: RE | Admit: 2017-02-11 | Discharge: 2017-02-11 | Disposition: A | Discharge status: Home / Self Care | Payer: Medicare Other | Source: Ambulatory Visit | Attending: Family Medicine | Admitting: Family Medicine

## 2017-02-11 DIAGNOSIS — J4530 Mild persistent asthma, uncomplicated: Secondary | ICD-10-CM | POA: Diagnosis present

## 2017-02-11 MED ORDER — ALBUTEROL SULFATE (2.5 MG/3ML) 0.083% IN NEBU
2.5000 mg | INHALATION_SOLUTION | Freq: Once | RESPIRATORY_TRACT | Status: AC
  Administered 2017-02-11: 2.5 mg via RESPIRATORY_TRACT

## 2017-02-13 LAB — PULMONARY FUNCTION TEST
FEF 25-75 POST: 3.14 L/s
FEF 25-75 PRE: 2.35 L/s
FEF2575-%CHANGE-POST: 33 %
FEF2575-%PRED-POST: 193 %
FEF2575-%PRED-PRE: 145 %
FEV1-%Change-Post: 4 %
FEV1-%PRED-POST: 117 %
FEV1-%Pred-Pre: 112 %
FEV1-Post: 2.11 L
FEV1-Pre: 2.02 L
FEV1FVC-%CHANGE-POST: 3 %
FEV1FVC-%PRED-PRE: 110 %
FEV6-%Change-Post: 0 %
FEV6-%PRED-POST: 106 %
FEV6-%Pred-Pre: 106 %
FEV6-PRE: 2.37 L
FEV6-Post: 2.36 L
FEV6FVC-%Pred-Post: 104 %
FEV6FVC-%Pred-Pre: 104 %
FVC-%Change-Post: 0 %
FVC-%Pred-Post: 103 %
FVC-%Pred-Pre: 102 %
FVC-POST: 2.38 L
FVC-Pre: 2.37 L
POST FEV1/FVC RATIO: 89 %
PRE FEV1/FVC RATIO: 85 %
Post FEV6/FVC ratio: 100 %
Pre FEV6/FVC Ratio: 100 %

## 2017-02-16 ENCOUNTER — Telehealth: Payer: Self-pay | Admitting: Family Medicine

## 2017-02-16 NOTE — Telephone Encounter (Signed)
Pt dropped off her blood glucose readings. Readings are in nurse box.

## 2017-02-17 NOTE — Telephone Encounter (Signed)
Glucose readings look good keep follow-up in August

## 2017-02-18 NOTE — Telephone Encounter (Signed)
Discussed with pt. Pt verbalized understanding.  °

## 2017-03-17 ENCOUNTER — Other Ambulatory Visit: Payer: Self-pay | Admitting: Family Medicine

## 2017-03-31 DIAGNOSIS — H47012 Ischemic optic neuropathy, left eye: Secondary | ICD-10-CM | POA: Diagnosis not present

## 2017-03-31 DIAGNOSIS — H25813 Combined forms of age-related cataract, bilateral: Secondary | ICD-10-CM | POA: Diagnosis not present

## 2017-03-31 DIAGNOSIS — E113299 Type 2 diabetes mellitus with mild nonproliferative diabetic retinopathy without macular edema, unspecified eye: Secondary | ICD-10-CM | POA: Diagnosis not present

## 2017-03-31 DIAGNOSIS — H34832 Tributary (branch) retinal vein occlusion, left eye, with macular edema: Secondary | ICD-10-CM | POA: Diagnosis not present

## 2017-04-06 ENCOUNTER — Ambulatory Visit: Payer: Medicare Other | Admitting: Nutrition

## 2017-04-19 ENCOUNTER — Telehealth: Payer: Self-pay | Admitting: Family Medicine

## 2017-04-19 ENCOUNTER — Encounter: Payer: Medicare Other | Attending: Family Medicine | Admitting: Nutrition

## 2017-04-19 ENCOUNTER — Encounter: Payer: Self-pay | Admitting: Family Medicine

## 2017-04-19 VITALS — Ht 64.0 in | Wt 186.0 lb

## 2017-04-19 DIAGNOSIS — E119 Type 2 diabetes mellitus without complications: Secondary | ICD-10-CM | POA: Diagnosis not present

## 2017-04-19 DIAGNOSIS — Z713 Dietary counseling and surveillance: Secondary | ICD-10-CM | POA: Diagnosis not present

## 2017-04-19 DIAGNOSIS — E669 Obesity, unspecified: Secondary | ICD-10-CM

## 2017-04-19 NOTE — Progress Notes (Signed)
Medical Nutrition Therapy:  Appt start time: 2952  end time:  1500 Assessment:  Primary concerns today: Diabetes Type 2.  Changes: Trying to eat more fresh fruits and vegetables. No low blood sugars. BS log brought in. FBS 90-120 mg/dl. Going to yoga classes and [employer]. Blood sugars are fantastic. Feels good. Sometimes can't eat all of the food advised. . Still taking Metformin 500 mg BID and Glipizide 5 mg in am and 2.5 mg in pm. Eating better balanced meals. Needs more lower carb vegetables.  Gained 3 lbs. She is going to try to be more active and get back to dancing for exercise to lose weight.    Lab Results  Component Value Date   HGBA1C 5.5 11/02/2016   Lipid Panel     Component Value Date/Time   CHOL 158 07/01/2016 0825   TRIG 102 07/01/2016 0825   HDL 45 07/01/2016 0825   CHOLHDL 3.5 07/01/2016 0825   CHOLHDL 3.3 05/27/2014 1041   VLDL 16 05/27/2014 1041   LDLCALC 93 07/01/2016 0825   CMP Latest Ref Rng & Units 07/01/2016 11/21/2015 10/18/2015  Glucose 65 - 99 mg/dL 151(H) 156(H) 244(H)  BUN 8 - 27 mg/dL 14 13 14   Creatinine 0.57 - 1.00 mg/dL 0.85 0.82 0.87  Sodium 134 - 144 mmol/L 142 142 139  Potassium 3.5 - 5.2 mmol/L 3.6 3.7 3.0(L)  Chloride 96 - 106 mmol/L 101 100 105  CO2 18 - 29 mmol/L 27 24 26   Calcium 8.7 - 10.3 mg/dL 9.2 9.7 8.6(L)  Total Protein 6.0 - 8.5 g/dL 6.5 - -  Total Bilirubin 0.0 - 1.2 mg/dL 0.5 - -  Alkaline Phos 39 - 117 IU/L 73 - -  AST 0 - 40 IU/L 19 - -  ALT 0 - 32 IU/L 22 - -     Preferred Learning Style:  No preference indicated   Learning Readiness:   Ready  Change in progress  MEDICATIONS:   DIETARY INTAKE:  24-hr recall:  B ( AM): oatmeal plan, 1 cup,  1 egg and  water or hot tea Snk ( AM):  L ( PM): String beans, chicken and okra tender, water Snk ( PM): none D ( PM): Watermelon, chicken, vegetables. Rice, Snk ( PM):  Beverages: water  Usual physical activity:  Walking 30 minutes and dance class, yoga,   Estimated  energy needs: 1200  calories 135  g carbohydrates 90 g protein 33 g fat  Progress Towards Goal(s):  In progress.   Nutritional Diagnosis:  NB-1.1 Food and nutrition-related knowledge deficit As related to Diabetes.  As evidenced by A1C > 7.5% .    Intervention:  Nutrition and Diabetes education provided on My Plate, CHO counting, meal planning, portion sizes, timing of meals, avoiding snacks between meals unless having a low blood sugar, target ranges for A1C and blood sugars, signs/symptoms and treatment of hyper/hypoglycemia, monitoring blood sugars, taking medications as prescribed, benefits of exercising 30 minutes per day and prevention of complications of DM.  Goals 1. Increase exercise to 30 or more minutes a day 2. Keep drinking water 3. Measure foods out for portion control Lose 2 lbs per month. Keep A1C less 6.5%.   Teaching Method Utilized:  Visual Auditory Hands on  Handouts given during visit include:  The Plate Method  Meal Plan Card  Barriers to learning/adherence to lifestyle change: none  Demonstrated degree of understanding via:  Teach Back   Monitoring/Evaluation:  Dietary intake, exercise, meal planning, SBG, and body  weight in 3 month(s).

## 2017-04-19 NOTE — Patient Instructions (Addendum)
Goals 1. Increase exercise to 30 or more minutes a day 2. Keep drinking water 3. Measure foods out for portion control Lose 2 lbs per month. Keep A1C less 6.5%.

## 2017-04-19 NOTE — Telephone Encounter (Signed)
Her review over all looks good follow-up in August let her sent to the patient

## 2017-04-19 NOTE — Telephone Encounter (Signed)
Patient dropped off a blood glucose log for you to review.  See in yellow box.

## 2017-04-21 ENCOUNTER — Other Ambulatory Visit: Payer: Self-pay | Admitting: Family Medicine

## 2017-05-17 ENCOUNTER — Encounter: Payer: Self-pay | Admitting: Vascular Surgery

## 2017-05-26 ENCOUNTER — Ambulatory Visit (HOSPITAL_COMMUNITY)
Admission: RE | Admit: 2017-05-26 | Discharge: 2017-05-26 | Disposition: A | Discharge status: Home / Self Care | Payer: Medicare Other | Source: Ambulatory Visit | Attending: Vascular Surgery | Admitting: Vascular Surgery

## 2017-05-26 ENCOUNTER — Encounter: Payer: Self-pay | Admitting: Vascular Surgery

## 2017-05-26 ENCOUNTER — Ambulatory Visit (INDEPENDENT_AMBULATORY_CARE_PROVIDER_SITE_OTHER): Payer: Medicare Other | Admitting: Vascular Surgery

## 2017-05-26 VITALS — BP 161/87 | HR 70 | Temp 98.4°F | Resp 16 | Ht 64.0 in | Wt 181.0 lb

## 2017-05-26 DIAGNOSIS — Z48812 Encounter for surgical aftercare following surgery on the circulatory system: Secondary | ICD-10-CM | POA: Insufficient documentation

## 2017-05-26 DIAGNOSIS — Z95828 Presence of other vascular implants and grafts: Secondary | ICD-10-CM | POA: Diagnosis not present

## 2017-05-26 DIAGNOSIS — I6523 Occlusion and stenosis of bilateral carotid arteries: Secondary | ICD-10-CM

## 2017-05-26 LAB — VAS US CAROTID
LCCADSYS: -75 cm/s
LCCAPDIAS: 14 cm/s
LEFT ECA DIAS: -10 cm/s
LICADDIAS: -14 cm/s
LICADSYS: -61 cm/s
Left CCA dist dias: -15 cm/s
Left CCA prox sys: 81 cm/s
Left ICA prox dias: -21 cm/s
Left ICA prox sys: -80 cm/s
RIGHT CCA MID DIAS: 15 cm/s
RIGHT ECA DIAS: 11 cm/s
Right CCA prox dias: -5 cm/s
Right CCA prox sys: -63 cm/s
Right cca dist sys: -52 cm/s

## 2017-05-26 NOTE — Progress Notes (Signed)
Patient is a 71 year old female status post right carotid stent January 2017. She had previously had a right carotid endarterectomy in 2000 for by my partner Dr. Scot Dock. She denies any new symptoms of TIA amaurosis or stroke. 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 This has resolved. She currently is on Plavix and aspirin with no bleeding difficulties. Chronic medical problems continue to remain diabetes hypertension hyperlipidemia and mild renal insufficiency. All of these are currently stable.  Review of systems: She denies any shortness of breath. She denies chest pain. She has had an upper respiratory infection or some chronic coughing recently. She states this was evaluated by pulmonary.  Physical exam:  Vitals:   05/26/17 1135 05/26/17 1137  BP: (!) 158/90 (!) 161/87  Pulse: 70   Resp: 16   Temp: 98.4 F (36.9 C)   TempSrc: Oral   SpO2: 99%   Weight: 181 lb (82.1 kg)   Height: 5\' 4"  (1.626 m)     Neck: 2+ carotid pulses no bruits Chest: Distant breath sounds otherwise clear to auscultation Cardiac: Regular rate and rhythm without murmur Sherman's: 2+ dorsalis pedis pulses bilaterally Neuro: Symmetric upper and lower extremity motor strength which is 5 over 5 no facial asymmetry  Data: Patient had a carotid duplex exam today. This showed wide patency of the right carotid stent. She had a 40-60% left internal carotid artery stenosis. This is essentially unchanged from August 2017.  Assessment: Mild to moderate asymptomatic left internal carotid artery stenosis. Widely patent right internal carotid artery stent.  Plan: The patient will follow-up with her nurse practitioner in 1 year with repeat carotid duplex scan at that time. I would continue her Plavix and aspirin combination therapy unless she has bleeding problems.  Ruta Hinds, MD [facility] Office:  [phone] Pager: [phone]

## 2017-05-30 NOTE — Addendum Note (Signed)
Addended by: Lianne Cure A on: 05/30/2017 04:00 PM   Modules accepted: Orders

## 2017-06-02 ENCOUNTER — Ambulatory Visit (INDEPENDENT_AMBULATORY_CARE_PROVIDER_SITE_OTHER): Payer: Medicare Other | Admitting: Family Medicine

## 2017-06-02 ENCOUNTER — Telehealth: Payer: Self-pay | Admitting: Family Medicine

## 2017-06-02 ENCOUNTER — Encounter: Payer: Self-pay | Admitting: Family Medicine

## 2017-06-02 VITALS — BP 150/84 | Ht 64.0 in | Wt 181.0 lb

## 2017-06-02 DIAGNOSIS — E119 Type 2 diabetes mellitus without complications: Secondary | ICD-10-CM | POA: Diagnosis not present

## 2017-06-02 DIAGNOSIS — I6523 Occlusion and stenosis of bilateral carotid arteries: Secondary | ICD-10-CM | POA: Diagnosis not present

## 2017-06-02 DIAGNOSIS — E876 Hypokalemia: Secondary | ICD-10-CM

## 2017-06-02 DIAGNOSIS — E784 Other hyperlipidemia: Secondary | ICD-10-CM | POA: Diagnosis not present

## 2017-06-02 DIAGNOSIS — I10 Essential (primary) hypertension: Secondary | ICD-10-CM

## 2017-06-02 DIAGNOSIS — E7849 Other hyperlipidemia: Secondary | ICD-10-CM

## 2017-06-02 LAB — POCT GLYCOSYLATED HEMOGLOBIN (HGB A1C): Hemoglobin A1C: 6.1

## 2017-06-02 MED ORDER — RANITIDINE HCL 300 MG PO TABS: 300.0000 mg | ORAL_TABLET | Freq: Every day | ORAL | 3 refills | Status: DC

## 2017-06-02 NOTE — Telephone Encounter (Signed)
Pt wanted me to ask Dr. Nicki Reaper if she needs to follow up in 3 months?  Nothing noted in checkout screen, please advise  Please call pt

## 2017-06-02 NOTE — Progress Notes (Signed)
   Subjective:    Patient ID: Kara Kirby, female    DOB: 04/13/1946, 71 y.o.   MRN: [id number]  Diabetes  She has type 2 diabetes mellitus. Pertinent negatives for hypoglycemia include no confusion. Pertinent negatives for diabetes include no chest pain, no fatigue, no polydipsia, no polyphagia and no weakness. Current diabetic treatments: Glipizide and Metformin. She is following a generally healthy diet. She has had a previous visit with a dietitian. She participates in exercise three times a week. She does not see a podiatrist.Eye exam is current.   Also wants to mention she is having a itchy scalp. Results for orders placed or performed in visit on 06/02/17  POCT glycosylated hemoglobin (Hb A1C)  Result Value Ref Range   Hemoglobin A1C 6.1   On multiple chronic meds Followed by vascular specialist for carotid artery disease Takes blood pressure medicine regular basis watch his diet his had blood pressure for years no complications  Hyperlipidemia takes medication no side effects watch his diet is had this for years previous labs reviewed  Recent hypokalemia needs repeat met 7  Review of Systems  Constitutional: Negative for activity change, appetite change and fatigue.  HENT: Negative for congestion.   Respiratory: Negative for cough.   Cardiovascular: Negative for chest pain.  Gastrointestinal: Negative for abdominal pain.  Endocrine: Negative for polydipsia and polyphagia.  Skin: Negative for color change.  Neurological: Negative for weakness.  Psychiatric/Behavioral: Negative for confusion.       Objective:   Physical Exam  Constitutional: She appears well-developed and well-nourished. No distress.  HENT:  Head: Normocephalic and atraumatic.  Eyes: Right eye exhibits no discharge. Left eye exhibits no discharge.  Neck: No tracheal deviation present.  Cardiovascular: Normal rate, regular rhythm and normal heart sounds.   No murmur heard. Pulmonary/Chest: Effort  normal and breath sounds normal. No respiratory distress. She has no wheezes. She has no rales.  Musculoskeletal: She exhibits no edema.  Lymphadenopathy:    She has no cervical adenopathy.  Neurological: She is alert. She exhibits normal muscle tone.  Skin: Skin is warm and dry. No erythema.  Psychiatric: Her behavior is normal.  Vitals reviewed.  Diabetic foot exam done today       Assessment & Plan:  Intermittent itchiness of the scalp-recommend T gel shampoo couple times a week 20 minutes at a time  Blood pressure good control continue current measures watch diet  Hyperlipidemia continue current measures. Previous labs reviewed. No new labs necessary currently  Hypokalemia repeat met 7  Diabetes good control no low sugars A1c looks good  Carotid artery disease followed by specialist  Patient with chronic sinus issues and allergies recommend OTC measures and nasal spray  25 minutes was spent with the patient. Greater than half the time was spent in discussion and answering questions and counseling regarding the issues that the patient came in for today.  

## 2017-06-02 NOTE — Telephone Encounter (Signed)
Patient may follow-up in 4 months no later than 5 months

## 2017-06-03 ENCOUNTER — Encounter: Payer: Self-pay | Admitting: Family Medicine

## 2017-06-03 LAB — BASIC METABOLIC PANEL
BUN/Creatinine Ratio: 15 (ref 12–28)
BUN: 14 mg/dL (ref 8–27)
CALCIUM: 10 mg/dL (ref 8.7–10.3)
CO2: 26 mmol/L (ref 20–29)
Chloride: 101 mmol/L (ref 96–106)
Creatinine, Ser: 0.93 mg/dL (ref 0.57–1.00)
GFR, EST AFRICAN AMERICAN: 71 mL/min/{1.73_m2} (ref >59)
GFR, EST NON AFRICAN AMERICAN: 62 mL/min/{1.73_m2} (ref >59)
Glucose: 123 mg/dL — ABNORMAL HIGH (ref 65–99)
Potassium: 3.6 mmol/L (ref 3.5–5.2)
SODIUM: 143 mmol/L (ref 134–144)

## 2017-06-03 NOTE — Telephone Encounter (Signed)
Spoke with patient, notified her that Dr. Nicki Reaper wants her to follow up in 4-5 months.  She asked that we send her a recall letter.

## 2017-06-06 ENCOUNTER — Other Ambulatory Visit: Payer: Self-pay | Admitting: Family Medicine

## 2017-07-12 ENCOUNTER — Other Ambulatory Visit: Payer: Self-pay | Admitting: Family Medicine

## 2017-07-20 ENCOUNTER — Ambulatory Visit: Payer: Medicare Other | Admitting: Nutrition

## 2017-07-20 ENCOUNTER — Encounter: Payer: Medicare Other | Attending: Family Medicine | Admitting: Nutrition

## 2017-07-20 ENCOUNTER — Other Ambulatory Visit: Payer: Self-pay | Admitting: Family Medicine

## 2017-07-20 VITALS — Ht 64.0 in | Wt 182.0 lb

## 2017-07-20 DIAGNOSIS — Z1231 Encounter for screening mammogram for malignant neoplasm of breast: Secondary | ICD-10-CM

## 2017-07-20 DIAGNOSIS — E669 Obesity, unspecified: Secondary | ICD-10-CM

## 2017-07-20 DIAGNOSIS — Z713 Dietary counseling and surveillance: Secondary | ICD-10-CM | POA: Insufficient documentation

## 2017-07-20 DIAGNOSIS — E119 Type 2 diabetes mellitus without complications: Secondary | ICD-10-CM | POA: Diagnosis not present

## 2017-07-20 NOTE — Patient Instructions (Signed)
Goals Keep up the great job!! Keep eating three meals per day Stop drinking water at 7 pm Walk 30 minutes a day Keep A1C 6.1 or less.

## 2017-07-20 NOTE — Progress Notes (Signed)
  Medical Nutrition Therapy:  Appt start time: 1884 end time:  1645  Assessment:  Primary concerns today: Diabetes Type 2. F/u  Changes: Trying to eat more fresh fruits and vegetables. No low blood sugars. BS log brought in. FBS 90-120 mg/dl. A1C 6.1%. Feels good. Wt stable. Going to yoga classes and [employer]. Blood sugars are fantastic. Feels good. Sometimes can't eat all of the food advised. . Still taking Metformin 500 mg BID and Glipizide 5 mg in am and 2.5 mg in pm. Eating better balanced meals. Needs more lower carb vegetables.   She is going to try to be more active and get back to dancing for exercise to lose weight.  CMP Latest Ref Rng & Units 06/02/2017 02/04/2017 07/01/2016  Glucose 65 - 99 mg/dL 123(H) 125(H) 151(H)  BUN 8 - 27 mg/dL 14 17 14   Creatinine 0.57 - 1.00 mg/dL 0.93 0.96 0.85  Sodium 134 - 144 mmol/L 143 142 142  Potassium 3.5 - 5.2 mmol/L 3.6 3.4(L) 3.6  Chloride 96 - 106 mmol/L 101 100 101  CO2 20 - 29 mmol/L 26 25 27   Calcium 8.7 - 10.3 mg/dL 10.0 9.4 9.2  Total Protein 6.0 - 8.5 g/dL - 6.5 6.5  Total Bilirubin 0.0 - 1.2 mg/dL - 0.5 0.5  Alkaline Phos 39 - 117 IU/L - 72 73  AST 0 - 40 IU/L - 17 19  ALT 0 - 32 IU/L - 24 22   Lab Results  Component Value Date   HGBA1C 6.1 06/02/2017       Preferred Learning Style:  No preference indicated   Learning Readiness:   Ready  Change in progress  MEDICATIONS:   DIETARY INTAKE:  24-hr recall:  B ( AM): oatmeal plan, 1 cup,  1 egg and  water or hot tea Snk ( AM):  L ( PM): String beans, chicken and okra tender, water Snk ( PM): none D ( PM): Watermelon, chicken, vegetables. Rice, Snk ( PM):  Beverages: water  Usual physical activity:  Walking 30 minutes and dance class, yoga,   Estimated energy needs: 1200  calories 135  g carbohydrates 90 g protein 33 g fat  Progress Towards Goal(s):  In progress.   Nutritional Diagnosis:  NB-1.1 Food and nutrition-related knowledge deficit As related to  Diabetes.  As evidenced by A1C > 7.5% .    Intervention:  Nutrition and Diabetes education provided on My Plate, CHO counting, meal planning, portion sizes, timing of meals, avoiding snacks between meals unless having a low blood sugar, target ranges for A1C and blood sugars, signs/symptoms and treatment of hyper/hypoglycemia, monitoring blood sugars, taking medications as prescribed, benefits of exercising 30 minutes per day and prevention of complications of DM.   Goals Keep up the great job!! Keep eating three meals per day Stop drinking water at 7 pm Walk 30 minutes a day Keep A1C 6.1 or less.   Teaching Method Utilized:  Visual Auditory Hands on  Handouts given during visit include:  The Plate Method  Meal Plan Card  Barriers to learning/adherence to lifestyle change: none  Demonstrated degree of understanding via:  Teach Back   Monitoring/Evaluation:  Dietary intake, exercise, meal planning, SBG, and body weight in 6 month(s).

## 2017-07-22 ENCOUNTER — Ambulatory Visit (HOSPITAL_COMMUNITY)
Admission: RE | Admit: 2017-07-22 | Discharge: 2017-07-22 | Disposition: A | Discharge status: Home / Self Care | Payer: Medicare Other | Source: Ambulatory Visit | Attending: Family Medicine | Admitting: Family Medicine

## 2017-07-22 DIAGNOSIS — Z1231 Encounter for screening mammogram for malignant neoplasm of breast: Secondary | ICD-10-CM | POA: Diagnosis not present

## 2017-07-24 ENCOUNTER — Other Ambulatory Visit: Payer: Self-pay | Admitting: Family Medicine

## 2017-07-26 ENCOUNTER — Ambulatory Visit (INDEPENDENT_AMBULATORY_CARE_PROVIDER_SITE_OTHER): Payer: Medicare Other

## 2017-07-26 DIAGNOSIS — Z23 Encounter for immunization: Secondary | ICD-10-CM

## 2017-07-27 ENCOUNTER — Other Ambulatory Visit: Payer: Self-pay | Admitting: Family Medicine

## 2017-08-01 ENCOUNTER — Other Ambulatory Visit: Payer: Self-pay | Admitting: Family Medicine

## 2017-08-02 NOTE — Telephone Encounter (Signed)
Last seen 06/02/2017 for DM.

## 2017-08-11 DIAGNOSIS — E113293 Type 2 diabetes mellitus with mild nonproliferative diabetic retinopathy without macular edema, bilateral: Secondary | ICD-10-CM | POA: Diagnosis not present

## 2017-08-11 DIAGNOSIS — H35372 Puckering of macula, left eye: Secondary | ICD-10-CM | POA: Diagnosis not present

## 2017-08-11 DIAGNOSIS — H468 Other optic neuritis: Secondary | ICD-10-CM | POA: Diagnosis not present

## 2017-08-11 DIAGNOSIS — H40003 Preglaucoma, unspecified, bilateral: Secondary | ICD-10-CM | POA: Diagnosis not present

## 2017-08-11 DIAGNOSIS — H34832 Tributary (branch) retinal vein occlusion, left eye, with macular edema: Secondary | ICD-10-CM | POA: Diagnosis not present

## 2017-08-11 DIAGNOSIS — H25813 Combined forms of age-related cataract, bilateral: Secondary | ICD-10-CM | POA: Diagnosis not present

## 2017-09-14 ENCOUNTER — Other Ambulatory Visit: Payer: Self-pay | Admitting: *Deleted

## 2017-09-14 ENCOUNTER — Other Ambulatory Visit: Payer: Self-pay | Admitting: Family Medicine

## 2017-09-14 MED ORDER — NITROGLYCERIN 0.4 MG SL SUBL: SUBLINGUAL_TABLET | SUBLINGUAL | 0 refills | Status: DC

## 2017-10-18 ENCOUNTER — Ambulatory Visit (INDEPENDENT_AMBULATORY_CARE_PROVIDER_SITE_OTHER): Payer: Medicare Other | Admitting: Family Medicine

## 2017-10-18 ENCOUNTER — Encounter: Payer: Self-pay | Admitting: Family Medicine

## 2017-10-18 VITALS — BP 148/70 | Ht 64.0 in | Wt 178.0 lb

## 2017-10-18 DIAGNOSIS — G4700 Insomnia, unspecified: Secondary | ICD-10-CM

## 2017-10-18 DIAGNOSIS — E7849 Other hyperlipidemia: Secondary | ICD-10-CM | POA: Diagnosis not present

## 2017-10-18 DIAGNOSIS — E119 Type 2 diabetes mellitus without complications: Secondary | ICD-10-CM

## 2017-10-18 DIAGNOSIS — R252 Cramp and spasm: Secondary | ICD-10-CM

## 2017-10-18 DIAGNOSIS — I10 Essential (primary) hypertension: Secondary | ICD-10-CM

## 2017-10-18 DIAGNOSIS — R5383 Other fatigue: Secondary | ICD-10-CM | POA: Diagnosis not present

## 2017-10-18 DIAGNOSIS — Z79899 Other long term (current) drug therapy: Secondary | ICD-10-CM

## 2017-10-18 LAB — POCT GLYCOSYLATED HEMOGLOBIN (HGB A1C): Hemoglobin A1C: 6.5

## 2017-10-18 NOTE — Patient Instructions (Signed)
DASH Eating Plan DASH stands for "Dietary Approaches to Stop Hypertension." The DASH eating plan is a healthy eating plan that has been shown to reduce high blood pressure (hypertension). It may also reduce your risk for type 2 diabetes, heart disease, and stroke. The DASH eating plan may also help with weight loss. What are tips for following this plan? General guidelines  Avoid eating more than 2,300 mg (milligrams) of salt (sodium) a day. If you have hypertension, you may need to reduce your sodium intake to 1,500 mg a day.  Limit alcohol intake to no more than 1 drink a day for nonpregnant women and 2 drinks a day for men. One drink equals 12 oz of beer, 5 oz of wine, or 1 oz of hard liquor.  Work with your health care provider to maintain a healthy body weight or to lose weight. Ask what an ideal weight is for you.  Get at least 30 minutes of exercise that causes your heart to beat faster (aerobic exercise) most days of the week. Activities may include walking, swimming, or biking.  Work with your health care provider or diet and nutrition specialist (dietitian) to adjust your eating plan to your individual calorie needs. Reading food labels  Check food labels for the amount of sodium per serving. Choose foods with less than 5 percent of the Daily Value of sodium. Generally, foods with less than 300 mg of sodium per serving fit into this eating plan.  To find whole grains, look for the word "whole" as the first word in the ingredient list. Shopping  Buy products labeled as "low-sodium" or "no salt added."  Buy fresh foods. Avoid canned foods and premade or frozen meals. Cooking  Avoid adding salt when cooking. Use salt-free seasonings or herbs instead of table salt or sea salt. Check with your health care provider or pharmacist before using salt substitutes.  Do not fry foods. Cook foods using healthy methods such as baking, boiling, grilling, and broiling instead.  Cook with  heart-healthy oils, such as olive, canola, soybean, or sunflower oil. Meal planning   Eat a balanced diet that includes: ? 5 or more servings of fruits and vegetables each day. At each meal, try to fill half of your plate with fruits and vegetables. ? Up to 6-8 servings of whole grains each day. ? Less than 6 oz of lean meat, poultry, or fish each day. A 3-oz serving of meat is about the same size as a deck of cards. One egg equals 1 oz. ? 2 servings of low-fat dairy each day. ? A serving of nuts, seeds, or beans 5 times each week. ? Heart-healthy fats. Healthy fats called Omega-3 fatty acids are found in foods such as flaxseeds and coldwater fish, like sardines, salmon, and mackerel.  Limit how much you eat of the following: ? Canned or prepackaged foods. ? Food that is high in trans fat, such as fried foods. ? Food that is high in saturated fat, such as fatty meat. ? Sweets, desserts, sugary drinks, and other foods with added sugar. ? Full-fat dairy products.  Do not salt foods before eating.  Try to eat at least 2 vegetarian meals each week.  Eat more home-cooked food and less restaurant, buffet, and fast food.  When eating at a restaurant, ask that your food be prepared with less salt or no salt, if possible. What foods are recommended? The items listed may not be a complete list. Talk with your dietitian about what   dietary choices are best for you. Grains Whole-grain or whole-wheat bread. Whole-grain or whole-wheat pasta. Brown rice. Oatmeal. Quinoa. Bulgur. Whole-grain and low-sodium cereals. Pita bread. Low-fat, low-sodium crackers. Whole-wheat flour tortillas. Vegetables Fresh or frozen vegetables (raw, steamed, roasted, or grilled). Low-sodium or reduced-sodium tomato and vegetable juice. Low-sodium or reduced-sodium tomato sauce and tomato paste. Low-sodium or reduced-sodium canned vegetables. Fruits All fresh, dried, or frozen fruit. Canned fruit in natural juice (without  added sugar). Meat and other protein foods Skinless chicken or turkey. Ground chicken or turkey. Pork with fat trimmed off. Fish and seafood. Egg whites. Dried beans, peas, or lentils. Unsalted nuts, nut butters, and seeds. Unsalted canned beans. Lean cuts of beef with fat trimmed off. Low-sodium, lean deli meat. Dairy Low-fat (1%) or fat-free (skim) milk. Fat-free, low-fat, or reduced-fat cheeses. Nonfat, low-sodium ricotta or cottage cheese. Low-fat or nonfat yogurt. Low-fat, low-sodium cheese. Fats and oils Soft margarine without trans fats. Vegetable oil. Low-fat, reduced-fat, or light mayonnaise and salad dressings (reduced-sodium). Canola, safflower, olive, soybean, and sunflower oils. Avocado. Seasoning and other foods Herbs. Spices. Seasoning mixes without salt. Unsalted popcorn and pretzels. Fat-free sweets. What foods are not recommended? The items listed may not be a complete list. Talk with your dietitian about what dietary choices are best for you. Grains Baked goods made with fat, such as croissants, muffins, or some breads. Dry pasta or rice meal packs. Vegetables Creamed or fried vegetables. Vegetables in a cheese sauce. Regular canned vegetables (not low-sodium or reduced-sodium). Regular canned tomato sauce and paste (not low-sodium or reduced-sodium). Regular tomato and vegetable juice (not low-sodium or reduced-sodium). Pickles. Olives. Fruits Canned fruit in a light or heavy syrup. Fried fruit. Fruit in cream or butter sauce. Meat and other protein foods Fatty cuts of meat. Ribs. Fried meat. Bacon. Sausage. Bologna and other processed lunch meats. Salami. Fatback. Hotdogs. Bratwurst. Salted nuts and seeds. Canned beans with added salt. Canned or smoked fish. Whole eggs or egg yolks. Chicken or turkey with skin. Dairy Whole or 2% milk, cream, and half-and-half. Whole or full-fat cream cheese. Whole-fat or sweetened yogurt. Full-fat cheese. Nondairy creamers. Whipped toppings.  Processed cheese and cheese spreads. Fats and oils Butter. Stick margarine. Lard. Shortening. Ghee. Bacon fat. Tropical oils, such as coconut, palm kernel, or palm oil. Seasoning and other foods Salted popcorn and pretzels. Onion salt, garlic salt, seasoned salt, table salt, and sea salt. Worcestershire sauce. Tartar sauce. Barbecue sauce. Teriyaki sauce. Soy sauce, including reduced-sodium. Steak sauce. Canned and packaged gravies. Fish sauce. Oyster sauce. Cocktail sauce. Horseradish that you find on the shelf. Ketchup. Mustard. Meat flavorings and tenderizers. Bouillon cubes. Hot sauce and Tabasco sauce. Premade or packaged marinades. Premade or packaged taco seasonings. Relishes. Regular salad dressings. Where to find more information:  National Heart, Lung, and Blood Institute: www.nhlbi.nih.gov  American Heart Association: www.heart.org Summary  The DASH eating plan is a healthy eating plan that has been shown to reduce high blood pressure (hypertension). It may also reduce your risk for type 2 diabetes, heart disease, and stroke.  With the DASH eating plan, you should limit salt (sodium) intake to 2,300 mg a day. If you have hypertension, you may need to reduce your sodium intake to 1,500 mg a day.  When on the DASH eating plan, aim to eat more fresh fruits and vegetables, whole grains, lean proteins, low-fat dairy, and heart-healthy fats.  Work with your health care provider or diet and nutrition specialist (dietitian) to adjust your eating plan to your individual   calorie needs. This information is not intended to replace advice given to you by your health care provider. Make sure you discuss any questions you have with your health care provider. Document Released: 09/16/2011 Document Revised: 09/20/2016 Document Reviewed: 09/20/2016 Elsevier Interactive Patient Education  2018 Elsevier Inc.  

## 2017-10-18 NOTE — Progress Notes (Signed)
Subjective:    Patient ID: Kara Kirby, female    DOB: June 16, 1946, 72 y.o.   MRN: [id number]  HPI  Patient is here today to follow up on her DM. She is on Glipizide 5 mg one in am and 1/2 at supper,and Metformin 500 mg one BID . She eats good and exercises . She sees [facility] Dr.Fields. Patient overall takes her medicine she denies any problem with the medicine.  25 minutes spent with patient discussing her blood pressure cholesterol and diabetes and also insomnia She denies any chest tightness pressure pain denies any stroke symptoms Results for orders placed or performed in visit on 10/18/17  POCT glycosylated hemoglobin (Hb A1C)  Result Value Ref Range   Hemoglobin A1C 6.5    Review of Systems  Constitutional: Negative for activity change, appetite change and fatigue.  HENT: Negative for congestion.   Respiratory: Negative for cough.   Cardiovascular: Negative for chest pain.  Gastrointestinal: Negative for abdominal pain.  Endocrine: Negative for polydipsia and polyphagia.  Skin: Negative for color change.  Neurological: Negative for weakness.  Psychiatric/Behavioral: Negative for confusion.       Objective:   Physical Exam  Constitutional: She appears well-developed and well-nourished. No distress.  HENT:  Head: Normocephalic and atraumatic.  Eyes: Right eye exhibits no discharge. Left eye exhibits no discharge.  Neck: No tracheal deviation present.  Cardiovascular: Normal rate, regular rhythm and normal heart sounds.  No murmur heard. Pulmonary/Chest: Effort normal and breath sounds normal. No respiratory distress. She has no wheezes. She has no rales.  Musculoskeletal: She exhibits no edema.  Lymphadenopathy:    She has no cervical adenopathy.  Neurological: She is alert. She exhibits normal muscle tone.  Skin: Skin is warm and dry. No erythema.  Psychiatric: Her behavior is normal.  Vitals reviewed.  Diabetic foot exam completed  25 minutes  was spent with the patient. Greater than half the time was spent in discussion and answering questions and counseling regarding the issues that the patient came in for today.      Assessment & Plan:  HTN- Patient was seen today as part of a visit regarding hypertension. The importance of healthy diet and regular physical activity was discussed. The importance of compliance with medications discussed. Ideal goal is to keep blood pressure low elevated levels certainly below 606/30 when possible. The patient was counseled that keeping blood pressure under control lessen his risk of heart attack, stroke, kidney failure, and early death. The importance of regular follow-ups was discussed with the patient. Low-salt diet such as DASH recommended. Regular physical activity was recommended as well. Patient was advised to keep regular follow-ups. Patient is going to work hard at diet when I checked her blood pressure sitting then again standing it drops to where increasing medication would increase her risk of accidents or injuries therefore we will keep the medicine as this  The patient was seen today as part of an evaluation regarding hyperlipidemia. Recent lab work has been reviewed with the patient as well as the goals for good cholesterol care. In addition to this medications have been discussed the importance of compliance with diet and medications discussed as well. Patient has been informed of potential side effects of medications in the importance to notify us should any problems occur. Finally the patient is aware that poor control of cholesterol, noncompliance can dramatically increase her risk of heart attack strokes and premature death. The patient will keep regular office visits and  the patient does agreed to periodic lab work.  The patient was seen today as part of a comprehensive visit for diabetes. The importance of keeping her A1c at or below 7 was discussed. Importance of regular physical activity  was discussed. Proper monitoring of glucose levels with glucometer discussed. The importance of adherence to medication as well as a controlled low starch/sugar diet was also discussed. Also discussion regarding the importance of diabetic foot checks including self check every day. Also yearly diabetic eye exams recommended. The importance of keeping blood pressure under control and keeping LDL below 70 was also discussed. Also the importance of avoiding smoking. Standard follow-up visit recommended. Finally failure to follow good diabetic measures including self effort and compliance with recommendations can certainly increase the risk of heart disease strokes kidney failure blindness loss of limb and early death was discussed with the patient.  Insomnia recommend melatonin at nighttime  Muscle cramps we will check lab work

## 2017-10-19 ENCOUNTER — Encounter: Payer: Self-pay | Admitting: Family Medicine

## 2017-10-19 LAB — BASIC METABOLIC PANEL
BUN / CREAT RATIO: 14 (ref 12–28)
BUN: 14 mg/dL (ref 8–27)
CALCIUM: 9.8 mg/dL (ref 8.7–10.3)
CHLORIDE: 101 mmol/L (ref 96–106)
CO2: 26 mmol/L (ref 20–29)
Creatinine, Ser: 0.99 mg/dL (ref 0.57–1.00)
GFR calc non Af Amer: 57 mL/min/{1.73_m2} — ABNORMAL LOW (ref >59)
GFR, EST AFRICAN AMERICAN: 66 mL/min/{1.73_m2} (ref >59)
Glucose: 166 mg/dL — ABNORMAL HIGH (ref 65–99)
POTASSIUM: 3.7 mmol/L (ref 3.5–5.2)
Sodium: 143 mmol/L (ref 134–144)

## 2017-10-19 LAB — CBC WITH DIFFERENTIAL/PLATELET
Basophils Absolute: 0 10*3/uL (ref 0.0–0.2)
Basos: 1 %
EOS (ABSOLUTE): 0.1 10*3/uL (ref 0.0–0.4)
EOS: 2 %
HEMOGLOBIN: 13.5 g/dL (ref 11.1–15.9)
Hematocrit: 40.5 % (ref 34.0–46.6)
Immature Grans (Abs): 0 10*3/uL (ref 0.0–0.1)
Immature Granulocytes: 0 %
LYMPHS ABS: 2.3 10*3/uL (ref 0.7–3.1)
Lymphs: 39 %
MCH: 29.5 pg (ref 26.6–33.0)
MCHC: 33.3 g/dL (ref 31.5–35.7)
MCV: 89 fL (ref 79–97)
MONOCYTES: 7 %
Monocytes Absolute: 0.4 10*3/uL (ref 0.1–0.9)
NEUTROS ABS: 3 10*3/uL (ref 1.4–7.0)
Neutrophils: 51 %
Platelets: 302 10*3/uL (ref 150–379)
RBC: 4.57 x10E6/uL (ref 3.77–5.28)
RDW: 14.4 % (ref 12.3–15.4)
WBC: 5.8 10*3/uL (ref 3.4–10.8)

## 2017-10-19 LAB — LIPID PANEL
Chol/HDL Ratio: 3.6 ratio (ref 0.0–4.4)
Cholesterol, Total: 149 mg/dL (ref 100–199)
HDL: 41 mg/dL (ref >39)
LDL Calculated: 81 mg/dL (ref 0–99)
TRIGLYCERIDES: 137 mg/dL (ref 0–149)
VLDL Cholesterol Cal: 27 mg/dL (ref 5–40)

## 2017-10-19 LAB — HEPATIC FUNCTION PANEL
ALT: 23 IU/L (ref 0–32)
AST: 17 IU/L (ref 0–40)
Albumin: 4.2 g/dL (ref 3.5–4.8)
Alkaline Phosphatase: 75 IU/L (ref 39–117)
Bilirubin Total: 0.5 mg/dL (ref 0.0–1.2)
Bilirubin, Direct: 0.15 mg/dL (ref 0.00–0.40)
Total Protein: 6.9 g/dL (ref 6.0–8.5)

## 2017-10-19 LAB — MAGNESIUM: Magnesium: 1.7 mg/dL (ref 1.6–2.3)

## 2017-11-03 DIAGNOSIS — H401134 Primary open-angle glaucoma, bilateral, indeterminate stage: Secondary | ICD-10-CM | POA: Diagnosis not present

## 2017-11-05 ENCOUNTER — Other Ambulatory Visit: Payer: Self-pay | Admitting: Family Medicine

## 2017-11-07 ENCOUNTER — Encounter: Payer: Self-pay | Admitting: Family Medicine

## 2017-11-07 ENCOUNTER — Telehealth: Payer: Self-pay | Admitting: Family Medicine

## 2017-11-07 DIAGNOSIS — H409 Unspecified glaucoma: Secondary | ICD-10-CM | POA: Insufficient documentation

## 2017-11-07 NOTE — Telephone Encounter (Signed)
A letter was dictated regarding that it was safe for her to use timolol eyedrops-please send this letter to Dr. Dionne Ano MD-[facility] [place]-fax number [phone]

## 2017-11-08 ENCOUNTER — Other Ambulatory Visit: Payer: Self-pay | Admitting: Family Medicine

## 2017-11-21 ENCOUNTER — Encounter: Payer: Self-pay | Admitting: Cardiology

## 2017-12-08 DIAGNOSIS — H401134 Primary open-angle glaucoma, bilateral, indeterminate stage: Secondary | ICD-10-CM | POA: Diagnosis not present

## 2017-12-20 ENCOUNTER — Other Ambulatory Visit: Payer: Self-pay | Admitting: Family Medicine

## 2017-12-21 ENCOUNTER — Other Ambulatory Visit: Payer: Self-pay

## 2017-12-21 MED ORDER — AMLODIPINE BESYLATE 10 MG PO TABS: ORAL_TABLET | ORAL | 1 refills | Status: DC

## 2018-01-04 ENCOUNTER — Other Ambulatory Visit: Payer: Self-pay | Admitting: Family Medicine

## 2018-01-19 ENCOUNTER — Encounter: Payer: Self-pay | Admitting: Nutrition

## 2018-01-19 ENCOUNTER — Encounter: Payer: Medicare Other | Attending: Family Medicine | Admitting: Nutrition

## 2018-01-19 VITALS — Ht 64.0 in | Wt 176.0 lb

## 2018-01-19 DIAGNOSIS — Z713 Dietary counseling and surveillance: Secondary | ICD-10-CM | POA: Insufficient documentation

## 2018-01-19 DIAGNOSIS — IMO0002 Reserved for concepts with insufficient information to code with codable children: Secondary | ICD-10-CM

## 2018-01-19 DIAGNOSIS — E118 Type 2 diabetes mellitus with unspecified complications: Secondary | ICD-10-CM

## 2018-01-19 DIAGNOSIS — Z6830 Body mass index (BMI) 30.0-30.9, adult: Secondary | ICD-10-CM | POA: Diagnosis not present

## 2018-01-19 DIAGNOSIS — E119 Type 2 diabetes mellitus without complications: Secondary | ICD-10-CM | POA: Diagnosis not present

## 2018-01-19 DIAGNOSIS — E1165 Type 2 diabetes mellitus with hyperglycemia: Secondary | ICD-10-CM

## 2018-01-19 NOTE — Progress Notes (Signed)
Medical Nutrition Therapy:  Appt start time: 1000  end time: 1030 Assessment:  Primary concerns today: Diabetes Type 2. F/u   FBS 88-110's  Has been dancing some and working in yard for exercise.  Lost 6 lbs. Eating much better and staying active. Glipizide  1 in am and 1/2 in pm and Metformin 500 mg BID. Needs any low blod sugars below 70. Tests BS usually once a day.  Needs to reschedule f/u appt with Dr. Wolfgang Phoenix. Didn't bring meter today. Lab Results  Component Value Date   HGBA1C 6.5 10/18/2017      She is going to try to be more active and get back to dancing for exercise to lose weight.  CMP Latest Ref Rng & Units 10/18/2017 06/02/2017 02/04/2017  Glucose 65 - 99 mg/dL 166(H) 123(H) 125(H)  BUN 8 - 27 mg/dL 14 14 17   Creatinine 0.57 - 1.00 mg/dL 0.99 0.93 0.96  Sodium 134 - 144 mmol/L 143 143 142  Potassium 3.5 - 5.2 mmol/L 3.7 3.6 3.4(L)  Chloride 96 - 106 mmol/L 101 101 100  CO2 20 - 29 mmol/L 26 26 25   Calcium 8.7 - 10.3 mg/dL 9.8 10.0 9.4  Total Protein 6.0 - 8.5 g/dL 6.9 - 6.5  Total Bilirubin 0.0 - 1.2 mg/dL 0.5 - 0.5  Alkaline Phos 39 - 117 IU/L 75 - 72  AST 0 - 40 IU/L 17 - 17  ALT 0 - 32 IU/L 23 - 24   Lab Results  Component Value Date   HGBA1C 6.5 10/18/2017       Preferred Learning Style:  No preference indicated   Learning Readiness:   Ready  Change in progress  MEDICATIONS:   DIETARY INTAKE:  24-hr recall:  B ( AM): oatmeal plan, 1 cup,  1 egg and  water or hot tea Snk ( AM):  L ( PM): Tunafish sandwich and 2 boiled eggs, fruit, water, or Diet Soda Snk ( PM):  D ( PM): Corn on cob, meat and vegggies, water   Snk ( PM):  Beverages: water  Usual physical activity:  Walking 30 minutes and dance class, yoga,   Estimated energy needs: 1200  calories 135  g carbohydrates 90 g protein 33 g fat  Progress Towards Goal(s):  In progress.   Nutritional Diagnosis:  NB-1.1 Food and nutrition-related knowledge deficit As related to Diabetes.  As  evidenced by A1C > 7.5% .    Intervention:  Nutrition and Diabetes education provided on My Plate, CHO counting, meal planning, portion sizes, timing of meals, avoiding snacks between meals unless having a low blood sugar, target ranges for A1C and blood sugars, signs/symptoms and treatment of hyper/hypoglycemia, monitoring blood sugars, taking medications as prescribed, benefits of exercising 30 minutes per day and prevention of complications of DM.   Goals Keep up the great job!! Keep eating three meals per day Stop drinking water at 7 pm Walk 30 minutes a day Keep A1C 6.1 or less.   Teaching Method Utilized:  Visual Auditory Hands on  Handouts given during visit include:  The Plate Method  Meal Plan Card  Barriers to learning/adherence to lifestyle change: none  Demonstrated degree of understanding via:  Teach Back   Monitoring/Evaluation:  Dietary intake, exercise, meal planning, SBG, and body weight in 6 month(s).    Medical Nutrition Therapy:  Appt start time: 1443 end time:  1645  Assessment:  Primary concerns today: Diabetes Type 2. F/u  Changes: Trying to eat more fresh fruits and  vegetables. No low blood sugars. BS log brought in. FBS 90-120 mg/dl. A1C 6.1%. Feels good. Wt stable. Going to yoga classes and [employer]. Blood sugars are fantastic. Feels good. Sometimes can't eat all of the food advised. . Still taking Metformin 500 mg BID and Glipizide 5 mg in am and 2.5 mg in pm. Eating better balanced meals. Needs more lower carb vegetables.   She is going to try to be more active and get back to dancing for exercise to lose weight.  CMP Latest Ref Rng & Units 10/18/2017 06/02/2017 02/04/2017  Glucose 65 - 99 mg/dL 166(H) 123(H) 125(H)  BUN 8 - 27 mg/dL 14 14 17   Creatinine 0.57 - 1.00 mg/dL 0.99 0.93 0.96  Sodium 134 - 144 mmol/L 143 143 142  Potassium 3.5 - 5.2 mmol/L 3.7 3.6 3.4(L)  Chloride 96 - 106 mmol/L 101 101 100  CO2 20 - 29 mmol/L 26 26 25   Calcium 8.7 -  10.3 mg/dL 9.8 10.0 9.4  Total Protein 6.0 - 8.5 g/dL 6.9 - 6.5  Total Bilirubin 0.0 - 1.2 mg/dL 0.5 - 0.5  Alkaline Phos 39 - 117 IU/L 75 - 72  AST 0 - 40 IU/L 17 - 17  ALT 0 - 32 IU/L 23 - 24   Lab Results  Component Value Date   HGBA1C 6.5 10/18/2017       Preferred Learning Style:  No preference indicated   Learning Readiness:   Ready  Change in progress  MEDICATIONS:   DIETARY INTAKE:  24-hr recall:  B ( AM): oatmeal plan, 1 cup,  1 egg and  water or hot tea Snk ( AM):  L ( PM): String beans, chicken and okra tender, water Snk ( PM): none D ( PM): Watermelon, chicken, vegetables. Rice, Snk ( PM):  Beverages: water  Usual physical activity:  Walking 30 minutes and dance class, yoga,   Estimated energy needs: 1200  calories 135  g carbohydrates 90 g protein 33 g fat  Progress Towards Goal(s):  In progress.   Nutritional Diagnosis:  NB-1.1 Food and nutrition-related knowledge deficit As related to Diabetes.  As evidenced by A1C > 7.5% .    Intervention:  Nutrition and Diabetes education provided on My Plate, CHO counting, meal planning, portion sizes, timing of meals, avoiding snacks between meals unless having a low blood sugar, target ranges for A1C and blood sugars, signs/symptoms and treatment of hyper/hypoglycemia, monitoring blood sugars, taking medications as prescribed, benefits of exercising 30 minutes per day and prevention of complications of DM. High Fiber Low Sodium  And Heart Healthy foods.  Goals Keep exercising  Increase fiber rich foods Keep drinking water Get A1C to 6%.   Teaching Method Utilized:  Visual Auditory Hands on  Handouts given during visit include:  The Plate Method  Meal Plan Card  Barriers to learning/adherence to lifestyle change: none  Demonstrated degree of understanding via:  Teach Back   Monitoring/Evaluation:  Dietary intake, exercise, meal planning, SBG, and body weight in 6 month(s).

## 2018-01-19 NOTE — Patient Instructions (Signed)
Goals Keep exercising  Increase fiber rich foods Keep drinking water Get A1C to 6%.

## 2018-02-04 ENCOUNTER — Other Ambulatory Visit: Payer: Self-pay | Admitting: Family Medicine

## 2018-02-07 ENCOUNTER — Other Ambulatory Visit: Payer: Self-pay | Admitting: *Deleted

## 2018-02-07 MED ORDER — GLUCOSE BLOOD VI STRP: ORAL_STRIP | 5 refills | Status: DC

## 2018-03-09 ENCOUNTER — Ambulatory Visit: Payer: Medicare Other | Admitting: Family Medicine

## 2018-03-10 ENCOUNTER — Ambulatory Visit (INDEPENDENT_AMBULATORY_CARE_PROVIDER_SITE_OTHER): Payer: Medicare Other | Admitting: Family Medicine

## 2018-03-10 ENCOUNTER — Encounter: Payer: Self-pay | Admitting: Family Medicine

## 2018-03-10 VITALS — BP 144/70 | Temp 98.6°F | Ht 64.0 in | Wt 171.6 lb

## 2018-03-10 DIAGNOSIS — E785 Hyperlipidemia, unspecified: Secondary | ICD-10-CM

## 2018-03-10 DIAGNOSIS — E119 Type 2 diabetes mellitus without complications: Secondary | ICD-10-CM

## 2018-03-10 DIAGNOSIS — K921 Melena: Secondary | ICD-10-CM

## 2018-03-10 DIAGNOSIS — L509 Urticaria, unspecified: Secondary | ICD-10-CM

## 2018-03-10 DIAGNOSIS — Z79899 Other long term (current) drug therapy: Secondary | ICD-10-CM

## 2018-03-10 DIAGNOSIS — I10 Essential (primary) hypertension: Secondary | ICD-10-CM | POA: Diagnosis not present

## 2018-03-10 LAB — HEMOCCULT GUIAC POC 1CARD (OFFICE): Fecal Occult Blood, POC: NEGATIVE

## 2018-03-10 LAB — POCT GLYCOSYLATED HEMOGLOBIN (HGB A1C): Hemoglobin A1C: 6.7 % — AB (ref 4.0–5.6)

## 2018-03-10 MED ORDER — PREDNISONE 10 MG PO TABS: ORAL_TABLET | ORAL | 0 refills | Status: DC

## 2018-03-10 NOTE — Progress Notes (Signed)
Subjective:    Patient ID: Kara Kirby, female    DOB: 11/01/1945, 72 y.o.   MRN: [id number]   Diabetes   She presents for her follow-up diabetic visit. She has type 2 diabetes mellitus. Pertinent negatives for hypoglycemia include no confusion. Pertinent negatives for diabetes include no chest pain, no fatigue, no polydipsia, no polyphagia and no weakness.  The patient was seen today as part of a comprehensive diabetic check up.The patient relates medication compliance. No significant side effects to the medications. Denies any low glucose spells. Relates compliance with diet to a reasonable level. Patient does do labwork intermittently and understands the dangers of diabetes.   Patient here for follow-up regarding cholesterol.  Patient does try to maintain a reasonable diet.  Patient does take the medication on a regular basis.  Denies missing a dose.  The patient denies any obvious side effects.  Prior blood work results reviewed with the patient.  The patient is aware of his cholesterol goals and the need to keep it under good control to lessen the risk of disease.  Patient for blood pressure check up. Patient relates compliance with meds. Todays BP reviewed with the patient. Patient denies issues with medication. Patient relates reasonable diet. Patient tries to minimize salt. Patient aware of BP goals. Patient also having A lot of head congestion drainage coughing sinus allergy symptoms.  Denies wheezing difficulty breathing and has had this for years but it gets worse during certain times of the year  Results for orders placed or performed in visit on 03/10/18  POCT glycosylated hemoglobin (Hb A1C)  Result Value Ref Range   Hemoglobin A1C 6.7 (A) 4.0 - 5.6 %   HbA1c, POC (prediabetic range)  5.7 - 6.4 %   HbA1c, POC (controlled diabetic range)  0.0 - 7.0 %   Rash on right leg and abdomen. Started this week. Itchy. Hives patient relates hives itching on the abdomen and also on  the right leg apparently there for no reason denies any particular food or medication it triggered this.  Has seen some bright red blood on toliet tissue when wiping after a BM a couple of times.  Patient is up-to-date on colonoscopy she has had some moderate firm stools she had a bowel movement that had no blood mixed in with it but when she wiped there was a small amount of blood-there is been no weight loss no abdominal pain issues  Allergies     Review of Systems  Constitutional: Negative for activity change, appetite change and fatigue.  HENT: Positive for congestion and rhinorrhea. Negative for trouble swallowing.   Respiratory: Negative for cough and shortness of breath.   Cardiovascular: Negative for chest pain and leg swelling.  Gastrointestinal: Positive for blood in stool and constipation. Negative for abdominal pain, diarrhea and nausea.  Endocrine: Negative for polydipsia and polyphagia.  Genitourinary: Negative for difficulty urinating and flank pain.  Musculoskeletal: Negative for arthralgias and back pain.  Skin: Negative for color change.  Neurological: Negative for weakness.  Psychiatric/Behavioral: Negative for agitation and confusion.       Objective:   Physical Exam  Constitutional: She appears well-nourished. No distress.  HENT:  Head: Normocephalic and atraumatic.  Right Ear: External ear normal.  Left Ear: External ear normal.  Mouth/Throat: Oropharynx is clear and moist.  Eyes: Right eye exhibits no discharge. Left eye exhibits no discharge.  Neck: No tracheal deviation present.  Cardiovascular: Normal rate, regular rhythm and normal heart sounds.  No  murmur heard. Pulmonary/Chest: Effort normal and breath sounds normal. No stridor. No respiratory distress. She has no wheezes.  Abdominal: Soft. She exhibits no distension. There is no tenderness. There is no guarding.  Musculoskeletal: She exhibits no edema or tenderness.  Lymphadenopathy:    She has no  cervical adenopathy.  Neurological: She is alert. She exhibits normal muscle tone.  Skin: Skin is warm and dry. Rash noted.  Psychiatric: She has a normal mood and affect. Her behavior is normal.  Vitals reviewed.  Patient does have a hive on her right leg approximately 1 inch x 2 inches and a small spot on her abdomen Rectal exam is normal Hemoccult card negative       Assessment & Plan:  Hives-Short course of prednisone.  Hard to pinpoint what is causing this if frequent trouble with this next step would be referral to allergist  Minimal blood on the tissue more than likely this is related to a anal stretch/tear I recommend stool test for blood patient up-to-date on colonoscopy I do not feel she needs to do colonoscopy currently  Diabetes fairly good control watch diet stay physically active  Hyperlipidemia check lab work previous labs reviewed continue medication  High risk of stroke she is to follow-up on a yearly basis with vascular doctor for carotid testing.  Continue Plavix and aspirin no bleeding issues with this I do not feel that the small amount of blood on tissue had to do with this medication  Blood pressure decent control.  Watch diet closely. Significant allergy issues.  Continue allergy medicines.   Patient was counseled regarding low sugar spells and to avoid these  To recheck blood pressure in 4 weeks.Patient will watch hard on diet and stay physically active we will see if this will improve things

## 2018-03-10 NOTE — Patient Instructions (Signed)
DASH Eating Plan DASH stands for "Dietary Approaches to Stop Hypertension." The DASH eating plan is a healthy eating plan that has been shown to reduce high blood pressure (hypertension). It may also reduce your risk for type 2 diabetes, heart disease, and stroke. The DASH eating plan may also help with weight loss. What are tips for following this plan? General guidelines  Avoid eating more than 2,300 mg (milligrams) of salt (sodium) a day. If you have hypertension, you may need to reduce your sodium intake to 1,500 mg a day.  Limit alcohol intake to no more than 1 drink a day for nonpregnant women and 2 drinks a day for men. One drink equals 12 oz of beer, 5 oz of wine, or 1 oz of hard liquor.  Work with your health care provider to maintain a healthy body weight or to lose weight. Ask what an ideal weight is for you.  Get at least 30 minutes of exercise that causes your heart to beat faster (aerobic exercise) most days of the week. Activities may include walking, swimming, or biking.  Work with your health care provider or diet and nutrition specialist (dietitian) to adjust your eating plan to your individual calorie needs. Reading food labels  Check food labels for the amount of sodium per serving. Choose foods with less than 5 percent of the Daily Value of sodium. Generally, foods with less than 300 mg of sodium per serving fit into this eating plan.  To find whole grains, look for the word "whole" as the first word in the ingredient list. Shopping  Buy products labeled as "low-sodium" or "no salt added."  Buy fresh foods. Avoid canned foods and premade or frozen meals. Cooking  Avoid adding salt when cooking. Use salt-free seasonings or herbs instead of table salt or sea salt. Check with your health care provider or pharmacist before using salt substitutes.  Do not fry foods. Cook foods using healthy methods such as baking, boiling, grilling, and broiling instead.  Cook with  heart-healthy oils, such as olive, canola, soybean, or sunflower oil. Meal planning   Eat a balanced diet that includes: ? 5 or more servings of fruits and vegetables each day. At each meal, try to fill half of your plate with fruits and vegetables. ? Up to 6-8 servings of whole grains each day. ? Less than 6 oz of lean meat, poultry, or fish each day. A 3-oz serving of meat is about the same size as a deck of cards. One egg equals 1 oz. ? 2 servings of low-fat dairy each day. ? A serving of nuts, seeds, or beans 5 times each week. ? Heart-healthy fats. Healthy fats called Omega-3 fatty acids are found in foods such as flaxseeds and coldwater fish, like sardines, salmon, and mackerel.  Limit how much you eat of the following: ? Canned or prepackaged foods. ? Food that is high in trans fat, such as fried foods. ? Food that is high in saturated fat, such as fatty meat. ? Sweets, desserts, sugary drinks, and other foods with added sugar. ? Full-fat dairy products.  Do not salt foods before eating.  Try to eat at least 2 vegetarian meals each week.  Eat more home-cooked food and less restaurant, buffet, and fast food.  When eating at a restaurant, ask that your food be prepared with less salt or no salt, if possible. What foods are recommended? The items listed may not be a complete list. Talk with your dietitian about what   dietary choices are best for you. Grains Whole-grain or whole-wheat bread. Whole-grain or whole-wheat pasta. Brown rice. Oatmeal. Quinoa. Bulgur. Whole-grain and low-sodium cereals. Pita bread. Low-fat, low-sodium crackers. Whole-wheat flour tortillas. Vegetables Fresh or frozen vegetables (raw, steamed, roasted, or grilled). Low-sodium or reduced-sodium tomato and vegetable juice. Low-sodium or reduced-sodium tomato sauce and tomato paste. Low-sodium or reduced-sodium canned vegetables. Fruits All fresh, dried, or frozen fruit. Canned fruit in natural juice (without  added sugar). Meat and other protein foods Skinless chicken or turkey. Ground chicken or turkey. Pork with fat trimmed off. Fish and seafood. Egg whites. Dried beans, peas, or lentils. Unsalted nuts, nut butters, and seeds. Unsalted canned beans. Lean cuts of beef with fat trimmed off. Low-sodium, lean deli meat. Dairy Low-fat (1%) or fat-free (skim) milk. Fat-free, low-fat, or reduced-fat cheeses. Nonfat, low-sodium ricotta or cottage cheese. Low-fat or nonfat yogurt. Low-fat, low-sodium cheese. Fats and oils Soft margarine without trans fats. Vegetable oil. Low-fat, reduced-fat, or light mayonnaise and salad dressings (reduced-sodium). Canola, safflower, olive, soybean, and sunflower oils. Avocado. Seasoning and other foods Herbs. Spices. Seasoning mixes without salt. Unsalted popcorn and pretzels. Fat-free sweets. What foods are not recommended? The items listed may not be a complete list. Talk with your dietitian about what dietary choices are best for you. Grains Baked goods made with fat, such as croissants, muffins, or some breads. Dry pasta or rice meal packs. Vegetables Creamed or fried vegetables. Vegetables in a cheese sauce. Regular canned vegetables (not low-sodium or reduced-sodium). Regular canned tomato sauce and paste (not low-sodium or reduced-sodium). Regular tomato and vegetable juice (not low-sodium or reduced-sodium). Pickles. Olives. Fruits Canned fruit in a light or heavy syrup. Fried fruit. Fruit in cream or butter sauce. Meat and other protein foods Fatty cuts of meat. Ribs. Fried meat. Bacon. Sausage. Bologna and other processed lunch meats. Salami. Fatback. Hotdogs. Bratwurst. Salted nuts and seeds. Canned beans with added salt. Canned or smoked fish. Whole eggs or egg yolks. Chicken or turkey with skin. Dairy Whole or 2% milk, cream, and half-and-half. Whole or full-fat cream cheese. Whole-fat or sweetened yogurt. Full-fat cheese. Nondairy creamers. Whipped toppings.  Processed cheese and cheese spreads. Fats and oils Butter. Stick margarine. Lard. Shortening. Ghee. Bacon fat. Tropical oils, such as coconut, palm kernel, or palm oil. Seasoning and other foods Salted popcorn and pretzels. Onion salt, garlic salt, seasoned salt, table salt, and sea salt. Worcestershire sauce. Tartar sauce. Barbecue sauce. Teriyaki sauce. Soy sauce, including reduced-sodium. Steak sauce. Canned and packaged gravies. Fish sauce. Oyster sauce. Cocktail sauce. Horseradish that you find on the shelf. Ketchup. Mustard. Meat flavorings and tenderizers. Bouillon cubes. Hot sauce and Tabasco sauce. Premade or packaged marinades. Premade or packaged taco seasonings. Relishes. Regular salad dressings. Where to find more information:  National Heart, Lung, and Blood Institute: www.nhlbi.nih.gov  American Heart Association: www.heart.org Summary  The DASH eating plan is a healthy eating plan that has been shown to reduce high blood pressure (hypertension). It may also reduce your risk for type 2 diabetes, heart disease, and stroke.  With the DASH eating plan, you should limit salt (sodium) intake to 2,300 mg a day. If you have hypertension, you may need to reduce your sodium intake to 1,500 mg a day.  When on the DASH eating plan, aim to eat more fresh fruits and vegetables, whole grains, lean proteins, low-fat dairy, and heart-healthy fats.  Work with your health care provider or diet and nutrition specialist (dietitian) to adjust your eating plan to your individual   calorie needs. This information is not intended to replace advice given to you by your health care provider. Make sure you discuss any questions you have with your health care provider. Document Released: 09/16/2011 Document Revised: 09/20/2016 Document Reviewed: 09/20/2016 Elsevier Interactive Patient Education  2018 Elsevier Inc.  

## 2018-03-13 ENCOUNTER — Other Ambulatory Visit: Payer: Self-pay | Admitting: Family Medicine

## 2018-03-16 ENCOUNTER — Other Ambulatory Visit (INDEPENDENT_AMBULATORY_CARE_PROVIDER_SITE_OTHER): Payer: Medicare Other

## 2018-03-16 DIAGNOSIS — K921 Melena: Secondary | ICD-10-CM | POA: Diagnosis not present

## 2018-03-16 LAB — IFOBT (OCCULT BLOOD): IMMUNOLOGICAL FECAL OCCULT BLOOD TEST: POSITIVE

## 2018-03-20 ENCOUNTER — Encounter: Payer: Self-pay | Admitting: Family Medicine

## 2018-03-20 ENCOUNTER — Encounter: Payer: Self-pay | Admitting: Gastroenterology

## 2018-03-23 ENCOUNTER — Telehealth: Payer: Self-pay | Admitting: *Deleted

## 2018-03-23 NOTE — Telephone Encounter (Signed)
Pt dropped off letter for dr Nicki Reaper. Pt wants to know if its ok to take benefiber drink mix because it contains phenylalanne. Dr Nicki Reaper states it is safe in limited quantities and to avoid "diet" sodas to minimize use. Pt notified.  Letter sent to medical records to scan into chart.

## 2018-04-05 ENCOUNTER — Other Ambulatory Visit: Payer: Self-pay | Admitting: Family Medicine

## 2018-04-06 DIAGNOSIS — H34832 Tributary (branch) retinal vein occlusion, left eye, with macular edema: Secondary | ICD-10-CM | POA: Diagnosis not present

## 2018-04-06 DIAGNOSIS — H468 Other optic neuritis: Secondary | ICD-10-CM | POA: Diagnosis not present

## 2018-04-06 DIAGNOSIS — H401124 Primary open-angle glaucoma, left eye, indeterminate stage: Secondary | ICD-10-CM | POA: Diagnosis not present

## 2018-04-06 DIAGNOSIS — H401111 Primary open-angle glaucoma, right eye, mild stage: Secondary | ICD-10-CM | POA: Diagnosis not present

## 2018-04-09 DIAGNOSIS — H401111 Primary open-angle glaucoma, right eye, mild stage: Secondary | ICD-10-CM | POA: Insufficient documentation

## 2018-04-20 ENCOUNTER — Telehealth: Payer: Self-pay | Admitting: *Deleted

## 2018-04-20 NOTE — Telephone Encounter (Signed)
Discussed with pt. Pt verbalized understanding.  °

## 2018-04-20 NOTE — Telephone Encounter (Signed)
Left message to return call  Pt dropped off letter asking if she should take her meds with food and if her appt with specialist was scheduled too far out. Dr Richardson Landry states she should take meds with food and she does need to see specialist and unfortunately many specialist are stretched out that far and appt on 06/21/18  is ok. Letter in nurse basket until after pt calls back and then send to be scanned into pt's chart.

## 2018-04-21 ENCOUNTER — Other Ambulatory Visit: Payer: Self-pay | Admitting: Family Medicine

## 2018-05-22 ENCOUNTER — Encounter: Payer: Medicare Other | Attending: Family Medicine | Admitting: Nutrition

## 2018-05-22 ENCOUNTER — Encounter: Payer: Self-pay | Admitting: Nutrition

## 2018-05-22 VITALS — Ht 64.0 in | Wt 172.0 lb

## 2018-05-22 DIAGNOSIS — Z713 Dietary counseling and surveillance: Secondary | ICD-10-CM | POA: Diagnosis not present

## 2018-05-22 DIAGNOSIS — Z6829 Body mass index (BMI) 29.0-29.9, adult: Secondary | ICD-10-CM | POA: Insufficient documentation

## 2018-05-22 DIAGNOSIS — E119 Type 2 diabetes mellitus without complications: Secondary | ICD-10-CM | POA: Insufficient documentation

## 2018-05-22 NOTE — Patient Instructions (Addendum)
GOALS   Keep up the great job! Keep fresh fruits and veggies. Eat high fiber foods  Try prune juice warm if constipated Keep drinking water May talk to MD about stopping Glipizide and increasing Metformin for better blood sugar control.

## 2018-05-22 NOTE — Progress Notes (Signed)
Medical Nutrition Therapy:  Appt start time: 1030  end time: 1100 Assessment:  Primary concerns today: Diabetes Type 2. F/u   FBS 95-120's.s  She doesn't check every day .Has been walking some and working in yard for exercise.  Wt gained 1 lb.  She notes she is constipated often and had blood in stool a few times. Scheduled to see GI in next few weeks.  . Eating much better and staying active. Glipizide  1 in am and 1/2 in pm and Metformin 500 mg BID. Tests BS usually once a day.   .Didn't bring meter today. She says sometimes she gets a little dizzy and sees black spots when she first wakes up. Says she stays hungry between meals. Eating proper carbs per meal. A1C good for her age at 6.7%, up from 6.1% though and was having some low's. May benefit from stopping GLipizide as risks outweigh benefits due to her age.  Lab Results  Component Value Date   HGBA1C 6.7 (A) 03/10/2018       She is going to try to be more active and get back to dancing for exercise to lose weight.  CMP Latest Ref Rng & Units 10/18/2017 06/02/2017 02/04/2017  Glucose 65 - 99 mg/dL 166(H) 123(H) 125(H)  BUN 8 - 27 mg/dL 14 14 17   Creatinine 0.57 - 1.00 mg/dL 0.99 0.93 0.96  Sodium 134 - 144 mmol/L 143 143 142  Potassium 3.5 - 5.2 mmol/L 3.7 3.6 3.4(L)  Chloride 96 - 106 mmol/L 101 101 100  CO2 20 - 29 mmol/L 26 26 25   Calcium 8.7 - 10.3 mg/dL 9.8 10.0 9.4  Total Protein 6.0 - 8.5 g/dL 6.9 - 6.5  Total Bilirubin 0.0 - 1.2 mg/dL 0.5 - 0.5  Alkaline Phos 39 - 117 IU/L 75 - 72  AST 0 - 40 IU/L 17 - 17  ALT 0 - 32 IU/L 23 - 24   Lab Results  Component Value Date   HGBA1C 6.7 (A) 03/10/2018       Preferred Learning Style:  No preference indicated   Learning Readiness:   Ready  Change in progress  MEDICATIONS:   DIETARY INTAKE:  24-hr recall:  B ( AM): oatmeal plan, 1 cup,  1 egg and  water or hot tea Snk ( AM):  L ( PM): Tunafish sandwich and 2 boiled eggs, fruit, water, or Diet Soda Snk ( PM):   D ( PM): Corn on cob, meat and vegggies, water   Snk ( PM):  Beverages: water  Usual physical activity:  Walking 30 minutes and dance class, yoga,   Estimated energy needs: 1200  calories 135  g carbohydrates 90 g protein 33 g fat  Progress Towards Goal(s):  In progress.   Nutritional Diagnosis:  NB-1.1 Food and nutrition-related knowledge deficit As related to Diabetes.  As evidenced by A1C > 7.5% .    Intervention:  Nutrition and Diabetes education provided on My Plate, CHO counting, meal planning, portion sizes, timing of meals, avoiding snacks between meals unless having a low blood sugar, target ranges for A1C and blood sugars, signs/symptoms and treatment of hyper/hypoglycemia, monitoring blood sugars, taking medications as prescribed, benefits of exercising 30 minutes per day and prevention of complications of DM.   GOALS   Keep up the great job! Keep fresh fruits and veggies. Eat high fiber foods  Try prune juice warm if constipated Keep drinking water May talk to MD about stopping Glipizide and increasing Metformin for better blood sugar  control.  Teaching Method Utilized:  Visual Auditory Hands on  Handouts given during visit include:  The Plate Method  Meal Plan Card  Barriers to learning/adherence to lifestyle change: none  Demonstrated degree of understanding via:  Teach Back   Monitoring/Evaluation:  Dietary intake, exercise, meal planning, SBG, and body weight in 3  Month(s). May consider stopping Glipizide and increase Metformin for greater benefit with her age.

## 2018-05-24 ENCOUNTER — Telehealth: Payer: Self-pay | Admitting: Family Medicine

## 2018-05-24 NOTE — Telephone Encounter (Signed)
Please advise 

## 2018-05-24 NOTE — Telephone Encounter (Signed)
Pt would like to know when she needs to follow up with Dr. Nicki Reaper. Would he like to see her before or after her appt with Dr. Oneida Alar

## 2018-05-24 NOTE — Telephone Encounter (Signed)
The patient has lab work ordered that she should do before her follow-up visit I would recommend the patient do a follow-up visit later in September after her appointment with Dr. Oneida Alar Or early October at the [facility] if any troubles

## 2018-05-24 NOTE — Telephone Encounter (Signed)
Patient is aware 

## 2018-05-24 NOTE — Telephone Encounter (Signed)
I called and left a message asked that she please return call. 

## 2018-05-25 DIAGNOSIS — H40003 Preglaucoma, unspecified, bilateral: Secondary | ICD-10-CM | POA: Diagnosis not present

## 2018-05-25 DIAGNOSIS — H468 Other optic neuritis: Secondary | ICD-10-CM | POA: Diagnosis not present

## 2018-05-25 DIAGNOSIS — H25813 Combined forms of age-related cataract, bilateral: Secondary | ICD-10-CM | POA: Diagnosis not present

## 2018-05-25 DIAGNOSIS — H34832 Tributary (branch) retinal vein occlusion, left eye, with macular edema: Secondary | ICD-10-CM | POA: Diagnosis not present

## 2018-05-25 DIAGNOSIS — H34833 Tributary (branch) retinal vein occlusion, bilateral, with macular edema: Secondary | ICD-10-CM | POA: Diagnosis not present

## 2018-05-25 DIAGNOSIS — E113299 Type 2 diabetes mellitus with mild nonproliferative diabetic retinopathy without macular edema, unspecified eye: Secondary | ICD-10-CM | POA: Diagnosis not present

## 2018-05-29 DIAGNOSIS — H401124 Primary open-angle glaucoma, left eye, indeterminate stage: Secondary | ICD-10-CM | POA: Diagnosis not present

## 2018-05-29 DIAGNOSIS — H401111 Primary open-angle glaucoma, right eye, mild stage: Secondary | ICD-10-CM | POA: Diagnosis not present

## 2018-06-21 ENCOUNTER — Ambulatory Visit (INDEPENDENT_AMBULATORY_CARE_PROVIDER_SITE_OTHER): Payer: Medicare Other | Admitting: Gastroenterology

## 2018-06-21 ENCOUNTER — Encounter: Payer: Self-pay | Admitting: *Deleted

## 2018-06-21 ENCOUNTER — Other Ambulatory Visit: Payer: Self-pay | Admitting: *Deleted

## 2018-06-21 ENCOUNTER — Encounter: Payer: Self-pay | Admitting: Gastroenterology

## 2018-06-21 DIAGNOSIS — K625 Hemorrhage of anus and rectum: Secondary | ICD-10-CM

## 2018-06-21 MED ORDER — PEG-KCL-NACL-NASULF-NA ASC-C 140 G PO SOLR: 1.0000 | ORAL | 0 refills | Status: DC

## 2018-06-21 NOTE — Progress Notes (Signed)
Subjective:    Patient ID: Kara Kirby, female    DOB: [birth year], 72 y.o.   MRN: [id number]  Kathyrn Drown, MD  HPI SAW BLOOD IN HER STOOL A COUPLE OF TIMES WHEN SHE STRAINS. HAD BLOOD DETECTED IN HER STOOL. VOMITED ON THE WAY. BMs: 2-3 TIMES A DAY(RABBIT BALLS).   PT DENIES FEVER, CHILLS, HEMATEMESIS, nausea, vomiting, melena, diarrhea, CHEST PAIN, SHORTNESS OF BREATH, CHANGE IN BOWEL IN HABITS, abdominal pain, problems swallowing, problems with sedation, OR heartburn or indigestion.   Past Medical History:  Diagnosis Date  . Allergy   . Arthritis    Left shoulder  . Carotid artery occlusion   . CVA (cerebral vascular accident) ([place]) 02-19-13  . DM (diabetes mellitus) ([place])   . Dyslipidemia   . GERD (gastroesophageal reflux disease)    intermittent Sx  . Hematuria   . Hip arthritis 2003   Left  . HTN (hypertension)   . Hyperlipidemia   . Renal insufficiency, mild   . Sleep related leg cramps     Past Surgical History:  Procedure Laterality Date  . CAROTID ENDARTERECTOMY  RIGHT  . COLONOSCOPY    . COLONOSCOPY  10/19/2011   Procedure: COLONOSCOPY;  Surgeon: Dorothyann Peng, MD;  [facility];  Service: Endoscopy;  Laterality: N/A;  10:15  . PERIPHERAL VASCULAR CATHETERIZATION Right 10/17/2015   Procedure: Carotid PTA/Stent Intervention;  Surgeon: Elam Dutch, MD;  Location: [facility];  Service: Cardiovascular;  Laterality: Right;  . PERIPHERAL VASCULAR CATHETERIZATION Right 10/17/2015   Procedure: Carotid Angiography;  Surgeon: Elam Dutch, MD;  Location: [facility];  Service: Cardiovascular;  Laterality: Right;  . S/P Hysterectomy    . TOTAL ABDOMINAL HYSTERECTOMY     FIBROID, HYPERMENORRHEA   . Allergies  Allergen Reactions  . Other Hives    IV DYE   . Iodinated Diagnostic Agents Hives    Pre-meditate with benadryl and prednisone  . Lisinopril     Angioedema of tongue July 2017    Current Outpatient Medications    Medication Sig    . acetaminophen (TYLENOL) 325 MG tablet Take 325-650 mg by mouth 3 (three) times daily as needed for mild pain or headache.    Marland Kitchen amLODipine (NORVASC) 10 MG tablet TAKE 1 TABLET BY MOUTH ONCE DAILY FOR BLOOD PRESSURE    . aspirin 81 MG tablet Take 81 mg by mouth daily.    Marland Kitchen atorvastatin (LIPITOR) 80 MG tablet TAKE 1 TABLET BY MOUTH ONCE DAILY    . clopidogrel (PLAVIX) 75 MG tablet TAKE 1 TABLET BY MOUTH ONCE DAILY    . doxazosin (CARDURA) 4 MG tablet TAKE 1 TABLET BY MOUTH ONCE DAILY AT BEDTIME    . doxazosin (CARDURA) 4 MG tablet TAKE 1 TABLET BY MOUTH ONCE DAILY AT BEDTIME    . fluticasone (FLONASE) 50 MCG/ACT nasal spray USE TWO SPRAY(S) IN EACH NOSTRIL ONCE DAILY    . glipiZIDE (GLUCOTROL) 5 MG tablet TAKE 1 TABLET BY MOUTH ONCE DAILY IN THE MORNING AND 1/2 (ONE-HALF) TABLET AT SUPPER    . glucose blood (ONE TOUCH ULTRA TEST) test strip USE ONE STRIP TO CHECK GLUCOSE ONCE DAILY. ICD - 10 code E11.9    . metFORMIN (GLUCOPHAGE) 500 MG tablet TAKE 1 TABLET BY MOUTH TWICE DAILY WITH MEALS    . Multiple Vitamin (MULTIVITAMIN) tablet Take 1 tablet by mouth daily.    . nitroGLYCERIN (NITROSTAT) 0.4 MG SL tablet DISSOLVE ONE TABLET UNDER  THE TONGUE EVERY 5 MINUTES AS NEEDED FOR CHEST PAIN.  DO NOT EXCEED A TOTAL OF 3 DOSES IN 15 MINUTES    . ranitidine (ZANTAC) 300 MG tablet Take 1 tablet (300 mg total) by mouth daily.    Marland Kitchen triamterene-hydrochlorothiazide (MAXZIDE-25) 37.5-25 MG tablet TAKE 1 TABLET BY MOUTH ONCE DAILY     . VENTOLIN HFA 108 (90 Base) MCG/ACT inhaler INHALE TWO PUFFS BY MOUTH EVERY 4 HOURS AS NEEDED    .        Family History  Problem Relation Age of Onset  . Colon cancer Neg Hx   . Colon polyps Neg Hx   . Hypertension Mother   . Heart attack Mother   . Heart disease Mother   . Hypertension Sister   . Diabetes Sister        Amputation  . Hyperlipidemia Sister   . Heart attack Sister   . Diabetes Sister   . Hypertension Sister   . Heart disease Sister         Before age 68  . Heart disease Father    Social History   Socioeconomic History  . Marital status: Divorced    Spouse name: Not on file  . Number of children: Not on file  . Years of education: Not on file  . Highest education level: Not on file  Occupational History  . Not on file  Social Needs  . Financial resource strain: Not on file  . Food insecurity:    Worry: Not on file    Inability: Not on file  . Transportation needs:    Medical: Not on file    Non-medical: Not on file  Tobacco Use  . Smoking status: Never Smoker  . Smokeless tobacco: Never Used  Substance and Sexual Activity  . Alcohol use: No    Alcohol/week: 0.0 standard drinks  . Drug use: No  . Sexual activity: Not on file  Lifestyle  . Physical activity:    Days per week: Not on file    Minutes per session: Not on file  . Stress: Not on file  Relationships  . Social connections:    Talks on phone: Not on file    Gets together: Not on file    Attends religious service: Not on file    Active member of club or organization: Not on file    Attends meetings of clubs or organizations: Not on file    Relationship status: Not on file  Other Topics Concern  . 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, BELONGS TO THE KICKERS AND DANCES IN THE PARADES/NURSING HOMES)   Review of Systems PER HPI OTHERWISE ALL SYSTEMS ARE NEGATIVE.    Objective:   Physical Exam  Constitutional: She is oriented to person, place, and time. She appears well-developed and well-nourished. No distress.  HENT:  Head: Normocephalic and atraumatic.  Mouth/Throat: Oropharynx is clear and moist. No oropharyngeal exudate.  Eyes: Pupils are equal, round, and reactive to light. No scleral icterus.  Neck: Normal range of motion. Neck supple.  Cardiovascular: Normal rate, regular rhythm and normal heart sounds.  Pulmonary/Chest: Effort normal and breath sounds normal. No respiratory distress.   Abdominal: Soft. Bowel sounds are normal. She exhibits no distension. There is no tenderness.  Musculoskeletal: She exhibits no edema.  Lymphadenopathy:    She has no cervical adenopathy.  Neurological: She is alert and oriented to person, place, and time.  NO  NEW  FOCAL DEFICITS  Psychiatric: She has a normal mood and affect.  Vitals reviewed.     Assessment & Plan:

## 2018-06-21 NOTE — Patient Instructions (Signed)
  COMPLETE COLONOSCOPY IN 3-4 WEEKS. YOU MAY BRING THE ENEMA TO ADMINISTER IN THE PREOP AREA.  ON DAY BEFORE COLONOSCOPY DO NOT TAKE MAXZIDE.  ON DAY OF COLONOSCOPY HOLD GLIPIZIDE. IT IS OK TO TAKE GLUCOPHAGE.  FOLLOW UP IN THE OFFICE WILL BE SCHEDULED IF NEEDED AFTER ENDOSCOPY.

## 2018-06-21 NOTE — Assessment & Plan Note (Addendum)
SYMPTOMS FAIRLY WELL CONTROLLED. DIFFERENTIAL DIAGNOSIS INCLUDES SKIN TAG, HEMORRHOIDS, COLON POLYPS, AVMs, & LESS LIKELY COLON CA.   COMPLETE COLONOSCOPY IN 3-4 WEEKS. YOU MAY BRING THE ENEMA TO ADMINISTER IN THE PREOP AREA. ON DAY BEFORE COLONOSCOPY DO NOT TAKE MAXZIDE. ON DAY OF COLONOSCOPY HOLD GLIPIZIDE. IT IS OK TO TAKE GLUCOPHAGE. DISCUSSED PROCEDURE, BENEFITS, & RISKS: < 1% chance of medication reaction, bleeding, perforation, or rupture of spleen/liver. FOLLOW UP IN THE OFFICE WILL BE SCHEDULED IF NEEDED AFTER ENDOSCOPY. PT HAS HAD AGE APPROPRIATE COLON CANCER SCREENING. NO NEED TO HEME TEST STOOLS.

## 2018-06-21 NOTE — Progress Notes (Signed)
CC'ED TO PCP 

## 2018-06-26 ENCOUNTER — Telehealth: Payer: Self-pay | Admitting: *Deleted

## 2018-06-26 NOTE — Telephone Encounter (Signed)
Patient stopped by the office. She was given coupon card for plenvu. It was too expensive for her still ($60). I provided patient with sample of plenvu. FYI to [facility].

## 2018-06-26 NOTE — Telephone Encounter (Signed)
REVIEWED-NO ADDITIONAL RECOMMENDATIONS. 

## 2018-07-15 ENCOUNTER — Other Ambulatory Visit: Payer: Self-pay | Admitting: Family Medicine

## 2018-07-19 ENCOUNTER — Telehealth: Payer: Self-pay | Admitting: Family Medicine

## 2018-07-19 ENCOUNTER — Ambulatory Visit (INDEPENDENT_AMBULATORY_CARE_PROVIDER_SITE_OTHER): Payer: Medicare Other

## 2018-07-19 ENCOUNTER — Other Ambulatory Visit: Payer: Self-pay | Admitting: Family Medicine

## 2018-07-19 DIAGNOSIS — Z23 Encounter for immunization: Secondary | ICD-10-CM

## 2018-07-19 DIAGNOSIS — Z1231 Encounter for screening mammogram for malignant neoplasm of breast: Secondary | ICD-10-CM

## 2018-07-19 MED ORDER — FAMOTIDINE 20 MG PO TABS: 20.0000 mg | ORAL_TABLET | Freq: Two times a day (BID) | ORAL | 0 refills | Status: DC

## 2018-07-19 NOTE — Telephone Encounter (Signed)
Pt was in for nurse visit today and wanted to know if she should get the pneumonia shot. Pt also had questions regarding ranitidine. Pt states she had saw where this medication was on the news and wanted to know if this was safe to take. Please advise. Thank you.

## 2018-07-19 NOTE — Addendum Note (Signed)
Addended by: Dairl Ponder on: 07/19/2018 03:50 PM   Modules accepted: Orders

## 2018-07-19 NOTE — Telephone Encounter (Signed)
1.  She is up-to-date on the pneumonia vaccine  2.  I would recommend stopping ranitidine because of associated potential problems with  3.  I would recommend she try Pepcid- the generic for Pepcid is available over-the-counter 20 mg twice daily  Please change these medications on her epic list

## 2018-07-19 NOTE — Telephone Encounter (Signed)
Left message to return call 

## 2018-07-19 NOTE — Telephone Encounter (Addendum)
Patient advised 1.  She is up-to-date on the pneumonia vaccine                            2.  Dr Nicki Reaper would recommend stopping ranitidine because of associated potential problems with                            3.  Dr Nicki Reaper would recommend she try Pepcid- the generic for Pepcid is available over-the-counter                                             20mg  twice daily  Patient verbalized understanding.

## 2018-07-27 ENCOUNTER — Ambulatory Visit (HOSPITAL_COMMUNITY)
Admission: RE | Admit: 2018-07-27 | Discharge: 2018-07-27 | Disposition: A | Discharge status: Home / Self Care | Payer: Medicare Other | Source: Ambulatory Visit | Attending: Family Medicine | Admitting: Family Medicine

## 2018-07-27 DIAGNOSIS — Z1231 Encounter for screening mammogram for malignant neoplasm of breast: Secondary | ICD-10-CM | POA: Diagnosis not present

## 2018-08-16 ENCOUNTER — Encounter (HOSPITAL_COMMUNITY): Payer: Self-pay | Admitting: *Deleted

## 2018-08-16 ENCOUNTER — Encounter (HOSPITAL_COMMUNITY)
Admission: RE | Disposition: A | Discharge status: Home / Self Care | Payer: Self-pay | Source: Ambulatory Visit | Attending: Gastroenterology

## 2018-08-16 ENCOUNTER — Ambulatory Visit (HOSPITAL_COMMUNITY)
Admission: RE | Admit: 2018-08-16 | Discharge: 2018-08-16 | Disposition: A | Discharge status: Home / Self Care | Payer: Medicare Other | Source: Ambulatory Visit | Attending: Gastroenterology | Admitting: Gastroenterology

## 2018-08-16 ENCOUNTER — Other Ambulatory Visit: Payer: Self-pay

## 2018-08-16 DIAGNOSIS — K5730 Diverticulosis of large intestine without perforation or abscess without bleeding: Secondary | ICD-10-CM | POA: Insufficient documentation

## 2018-08-16 DIAGNOSIS — E119 Type 2 diabetes mellitus without complications: Secondary | ICD-10-CM | POA: Diagnosis not present

## 2018-08-16 DIAGNOSIS — K921 Melena: Secondary | ICD-10-CM | POA: Insufficient documentation

## 2018-08-16 DIAGNOSIS — I10 Essential (primary) hypertension: Secondary | ICD-10-CM | POA: Insufficient documentation

## 2018-08-16 DIAGNOSIS — Z7902 Long term (current) use of antithrombotics/antiplatelets: Secondary | ICD-10-CM | POA: Insufficient documentation

## 2018-08-16 DIAGNOSIS — Z8673 Personal history of transient ischemic attack (TIA), and cerebral infarction without residual deficits: Secondary | ICD-10-CM | POA: Insufficient documentation

## 2018-08-16 DIAGNOSIS — K625 Hemorrhage of anus and rectum: Secondary | ICD-10-CM | POA: Diagnosis not present

## 2018-08-16 DIAGNOSIS — Z7984 Long term (current) use of oral hypoglycemic drugs: Secondary | ICD-10-CM | POA: Diagnosis not present

## 2018-08-16 DIAGNOSIS — Z79899 Other long term (current) drug therapy: Secondary | ICD-10-CM | POA: Diagnosis not present

## 2018-08-16 DIAGNOSIS — E785 Hyperlipidemia, unspecified: Secondary | ICD-10-CM | POA: Diagnosis not present

## 2018-08-16 DIAGNOSIS — K644 Residual hemorrhoidal skin tags: Secondary | ICD-10-CM | POA: Insufficient documentation

## 2018-08-16 DIAGNOSIS — K219 Gastro-esophageal reflux disease without esophagitis: Secondary | ICD-10-CM | POA: Insufficient documentation

## 2018-08-16 DIAGNOSIS — Z7982 Long term (current) use of aspirin: Secondary | ICD-10-CM | POA: Insufficient documentation

## 2018-08-16 DIAGNOSIS — K648 Other hemorrhoids: Secondary | ICD-10-CM | POA: Insufficient documentation

## 2018-08-16 DIAGNOSIS — Q438 Other specified congenital malformations of intestine: Secondary | ICD-10-CM | POA: Diagnosis not present

## 2018-08-16 HISTORY — PX: COLONOSCOPY: SHX5424

## 2018-08-16 LAB — GLUCOSE, CAPILLARY: Glucose-Capillary: 165 mg/dL — ABNORMAL HIGH (ref 70–99)

## 2018-08-16 SURGERY — COLONOSCOPY
Anesthesia: Moderate Sedation

## 2018-08-16 MED ORDER — LIDOCAINE HCL URETHRAL/MUCOSAL 2 % EX GEL
CUTANEOUS | Status: AC
  Filled 2018-08-16: qty 30

## 2018-08-16 MED ORDER — MEPERIDINE HCL 100 MG/ML IJ SOLN
INTRAMUSCULAR | Status: AC
  Filled 2018-08-16: qty 2

## 2018-08-16 MED ORDER — STERILE WATER FOR IRRIGATION IR SOLN
Status: DC | PRN
  Administered 2018-08-16: 100 mL

## 2018-08-16 MED ORDER — MEPERIDINE HCL 100 MG/ML IJ SOLN
INTRAMUSCULAR | Status: DC | PRN
  Administered 2018-08-16: 50 mg via INTRAVENOUS
  Administered 2018-08-16: 25 mg via INTRAVENOUS

## 2018-08-16 MED ORDER — SODIUM CHLORIDE 0.9 % IV SOLN
INTRAVENOUS | Status: DC
  Administered 2018-08-16: 09:00:00 via INTRAVENOUS

## 2018-08-16 MED ORDER — MIDAZOLAM HCL 5 MG/5ML IJ SOLN
INTRAMUSCULAR | Status: DC | PRN
  Administered 2018-08-16: 1 mg via INTRAVENOUS
  Administered 2018-08-16 (×2): 2 mg via INTRAVENOUS

## 2018-08-16 MED ORDER — MIDAZOLAM HCL 5 MG/5ML IJ SOLN
INTRAMUSCULAR | Status: AC
  Filled 2018-08-16: qty 10

## 2018-08-16 NOTE — H&P (Signed)
Primary Care Physician:  Kathyrn Drown, MD Primary Gastroenterologist:  Dr. Oneida Alar  Pre-Procedure History & Physical: HPI:  Kara Kirby is a 72 y.o. female here for [facility].  Past Medical History:  Diagnosis Date  . Allergy   . Arthritis    Left shoulder  . Carotid artery occlusion   . CVA (cerebral vascular accident) ([place]) 02-19-13  . DM (diabetes mellitus) ([place])   . Dyslipidemia   . GERD (gastroesophageal reflux disease)    intermittent Sx  . Hematuria   . Hip arthritis 2003   Left  . HTN (hypertension)   . Hyperlipidemia   . Renal insufficiency, mild   . Sleep related leg cramps     Past Surgical History:  Procedure Laterality Date  . CAROTID ENDARTERECTOMY  RIGHT  . COLONOSCOPY    . COLONOSCOPY  10/19/2011   Procedure: COLONOSCOPY;  Surgeon: Dorothyann Peng, MD;  [facility];  Service: Endoscopy;  Laterality: N/A;  10:15  . PERIPHERAL VASCULAR CATHETERIZATION Right 10/17/2015   Procedure: Carotid PTA/Stent Intervention;  Surgeon: Elam Dutch, MD;  Location: [facility];  Service: Cardiovascular;  Laterality: Right;  . PERIPHERAL VASCULAR CATHETERIZATION Right 10/17/2015   Procedure: Carotid Angiography;  Surgeon: Elam Dutch, MD;  Location: [facility];  Service: Cardiovascular;  Laterality: Right;  . S/P Hysterectomy    . TOTAL ABDOMINAL HYSTERECTOMY     FIBROID, HYPERMENORRHEA    Prior to Admission medications   Medication Sig Start Date End Date Taking? Authorizing Provider  acetaminophen (TYLENOL) 500 MG tablet Take 500-1,000 mg by mouth 2 (two) times daily as needed for mild pain.   Yes [provider]  amLODipine (NORVASC) 10 MG tablet TAKE 1 TABLET BY MOUTH ONCE DAILY FOR BLOOD PRESSURE Patient taking differently: Take 10 mg by mouth daily.  12/21/17  Yes Kathyrn Drown, MD  aspirin 81 MG tablet Take 81 mg by mouth daily.   Yes [provider]  atorvastatin (LIPITOR) 80 MG tablet TAKE 1  TABLET BY MOUTH ONCE DAILY Patient taking differently: Take 80 mg by mouth daily at 6 PM.  04/21/18  Yes Luking, Elayne Snare, MD  clopidogrel (PLAVIX) 75 MG tablet TAKE 1 TABLET BY MOUTH ONCE DAILY Patient taking differently: Take 75 mg by mouth daily.  04/21/18  Yes Luking, Elayne Snare, MD  doxazosin (CARDURA) 4 MG tablet TAKE 1 TABLET BY MOUTH ONCE DAILY AT BEDTIME 04/05/18  Yes Luking, Scott A, MD  fluticasone (FLONASE) 50 MCG/ACT nasal spray USE TWO SPRAY(S) IN EACH NOSTRIL ONCE DAILY Patient taking differently: Place 2 sprays into both nostrils daily as needed for allergies.  10/05/16  Yes Luking, Scott A, MD  glipiZIDE (GLUCOTROL) 5 MG tablet TAKE 1 TABLET BY MOUTH ONCE DAILY IN THE MORNING AND 1/2 (ONE-HALF) TABLET AT SUPPER Patient taking differently: Take 2.5-5 mg by mouth See admin instructions. TAKE 1 TABLET BY MOUTH ONCE DAILY IN THE MORNING AND 1/2 (ONE-HALF) TABLET AT SUPPER 04/21/18  Yes Luking, Scott A, MD  latanoprost (XALATAN) 0.005 % ophthalmic solution Place 1 drop into both eyes at bedtime.   Yes [provider]  metFORMIN (GLUCOPHAGE) 500 MG tablet TAKE 1 TABLET BY MOUTH TWICE DAILY WITH MEALS Patient taking differently: Take 500 mg by mouth 2 (two) times daily with a meal.  07/17/18  Yes Luking, Elayne Snare, MD  Multiple Vitamin (MULTIVITAMIN) tablet Take 1 tablet by mouth daily.   Yes [provider]  nitroGLYCERIN (  NITROSTAT) 0.4 MG SL tablet DISSOLVE ONE TABLET UNDER THE TONGUE EVERY 5 MINUTES AS NEEDED FOR CHEST PAIN.  DO NOT EXCEED A TOTAL OF 3 DOSES IN 15 MINUTES Patient taking differently: Place 0.4 mg under the tongue every 5 (five) minutes as needed for chest pain. DISSOLVE ONE TABLET UNDER THE TONGUE EVERY 5 MINUTES AS NEEDED FOR CHEST PAIN.  DO NOT EXCEED A TOTAL OF 3 DOSES IN 15 MINUTES 02/07/18  Yes Luking, Elayne Snare, MD  PEG-KCl-NaCl-NaSulf-Na Asc-C (PLENVU) 140 g SOLR Take 1 kit by mouth as directed. 06/21/18  Yes Shawntina Diffee L, MD  timolol (TIMOPTIC) 0.5 %  ophthalmic solution Place 1 drop into both eyes daily. 06/20/18  Yes [provider]  triamterene-hydrochlorothiazide (MAXZIDE-25) 37.5-25 MG tablet TAKE 1 TABLET BY MOUTH ONCE DAILY **TAKING  PLACE  OF  CHLORTHALIDONE** Patient taking differently: Take 1 tablet by mouth daily. **TAKING  PLACE  OF  CHLORTHALIDONE** 03/13/18  Yes Luking, Elayne Snare, MD  VENTOLIN HFA 108 (90 Base) MCG/ACT inhaler INHALE TWO PUFFS BY MOUTH EVERY 4 HOURS AS NEEDED Patient taking differently: Inhale 2 puffs into the lungs every 4 (four) hours as needed for wheezing or shortness of breath.  09/14/17  Yes Kathyrn Drown, MD  famotidine (PEPCID) 20 MG tablet Take 1 tablet (20 mg total) by mouth 2 (two) times daily. Patient not taking: Reported on 08/10/2018 07/19/18   Kathyrn Drown, MD  glucose blood (ONE TOUCH ULTRA TEST) test strip USE ONE STRIP TO CHECK GLUCOSE ONCE DAILY. ICD - 10 code E11.9 02/07/18   Kathyrn Drown, MD    Allergies as of 06/21/2018 - Review Complete 06/21/2018  Allergen Reaction Noted  . Other Hives 08/04/2011  . Iodinated diagnostic agents Hives 10/06/2015  . Lisinopril  04/21/2016    Family History  Problem Relation Age of Onset  . Hypertension Mother   . Heart attack Mother   . Heart disease Mother   . Hypertension Sister   . Diabetes Sister        Amputation  . Hyperlipidemia Sister   . Heart attack Sister   . Diabetes Sister   . Hypertension Sister   . Heart disease Sister        Before age 63  . Heart disease Father   . Colon cancer Neg Hx   . Colon polyps Neg Hx     Social History   Socioeconomic History  . Marital status: Divorced    Spouse name: Not on file  . Number of children: Not on file  . Years of education: Not on file  . Highest education level: Not on file  Occupational History  . Not on file  Social Needs  . Financial resource strain: Not on file  . Food insecurity:    Worry: Not on file    Inability: Not on file  . Transportation needs:     Medical: Not on file    Non-medical: Not on file  Tobacco Use  . Smoking status: Never Smoker  . Smokeless tobacco: Never Used  Substance and Sexual Activity  . Alcohol use: No    Alcohol/week: 0.0 standard drinks  . Drug use: No  . Sexual activity: Not on file  Lifestyle  . Physical activity:    Days per week: Not on file    Minutes per session: Not on file  . Stress: Not on file  Relationships  . Social connections:    Talks on phone: Not on file  Gets together: Not on file    Attends religious service: Not on file    Active member of club or organization: Not on file    Attends meetings of clubs or organizations: Not on file    Relationship status: Not on file  . Intimate partner violence:    Fear of current or ex partner: Not on file    Emotionally abused: Not on file    Physically abused: Not on file    Forced sexual activity: Not on file  Other Topics Concern  . 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, BELONGS TO THE KICKERS IN THE [place])    Review of Systems: See HPI, otherwise negative ROS   Physical Exam: BP (!) 184/97   Pulse (!) 105   Temp 97.8 F (36.6 C) (Oral)   Ht _0  (1.626 m)   Wt 77.1 kg   SpO2 97%   BMI 29.18 kg/m  General:   Alert,  pleasant and cooperative in NAD Head:  Normocephalic and atraumatic. Neck:  Supple; Lungs:  Clear throughout to auscultation.    Heart:  Regular rate and rhythm. Abdomen:  Soft, nontender and nondistended. Normal bowel sounds, without guarding, and without rebound.   Neurologic:  Alert and  oriented x4;  grossly normal neurologically.  Impression/Plan:    BRBPR  PLAN: TCS TODAY DISCUSSED PROCEDURE, BENEFITS, & RISKS: < 1% chance of medication reaction, bleeding, perforation, or rupture of spleen/liver.

## 2018-08-16 NOTE — Op Note (Signed)
[facility] Patient Name: Kara Kirby Procedure Date: 08/16/2018 9:14 AM MRN: [id number] Date of Birth: 01-09-1946 Attending MD: Barney Drain MD, MD CSN: [id number] Age: 72 Admit Type: Outpatient Procedure:                Colonoscopy, DIAGNOSTIC Indications:              Hematochezia Providers:                Barney Drain MD, MD, Janeece Riggers, RN, Rosina Lowenstein,                            RN Referring MD:             Elayne Snare Luking Medicines:                Meperidine 75 mg IV, Midazolam 5 mg IV Complications:            No immediate complications. Estimated Blood Loss:     Estimated blood loss: none. Procedure:                Pre-Anesthesia Assessment:                           - Prior to the procedure, a History and Physical                            was performed, and patient medications and                            allergies were reviewed. The patient's tolerance of                            previous anesthesia was also reviewed. The risks                            and benefits of the procedure and the sedation                            options and risks were discussed with the patient.                            All questions were answered, and informed consent                            was obtained. Prior Anticoagulants: The patient has                            taken aspirin, last dose was 1 day prior to                            procedure. ASA Grade Assessment: II - A patient                            with mild systemic disease. After reviewing the  risks and benefits, the patient was deemed in                            satisfactory condition to undergo the procedure.                            After obtaining informed consent, the colonoscope                            was passed under direct vision. Throughout the                            procedure, the patient's blood pressure, pulse, and                            oxygen  saturations were monitored continuously. The                            CF-HQ190L ([id number]) scope was introduced through                            the anus and advanced to the the cecum, identified                            by appendiceal orifice and ileocecal valve. The                            colonoscopy was somewhat difficult due to a                            tortuous colon. Successful completion of the                            procedure was aided by increasing the dose of                            sedation medication, straightening and shortening                            the scope to obtain bowel loop reduction and                            COLOWRAP. The patient tolerated the procedure                            fairly well. The quality of the bowel preparation                            was excellent. The ileocecal valve, appendiceal                            orifice, and rectum were photographed. Scope In: 9:48:14 AM Scope Out: 10:04:19 AM Scope Withdrawal Time: 0 hours 13 minutes 23 seconds  Total  Procedure Duration: 0 hours 16 minutes 5 seconds  Findings:      Multiple small and large-mouthed diverticula were found in the       recto-sigmoid colon, sigmoid colon and cecum.      The recto-sigmoid colon and sigmoid colon were mildly tortuous.      Internal hemorrhoids were found. The hemorrhoids were small.      External hemorrhoids were found. The hemorrhoids were moderate. Impression:               - MODERATE Diverticulosis in the recto-sigmoid                            colon, in the sigmoid colon and MILD in the cecum.                           - Tortuous LEFT colon.                           - Internal hemorrhoids.                           - External hemorrhoids. Moderate Sedation:      Moderate (conscious) sedation was administered by the endoscopy nurse       and supervised by the endoscopist. The following parameters were       monitored: oxygen saturation,  heart rate, blood pressure, and response       to care. Total physician intraservice time was 27 minutes. Recommendation:           - Patient has a contact number available for                            emergencies. The signs and symptoms of potential                            delayed complications were discussed with the                            patient. Return to normal activities tomorrow.                            Written discharge instructions were provided to the                            patient.                           - High fiber diet.                           - Continue present medications.                           - Repeat colonoscopy is not recommended due to                            current age (45 years or older) for surveillance.                           -  Return to my office in 4 months. Procedure Code(s):        --- Professional ---                           [phone], Colonoscopy, flexible; diagnostic, including                            collection of specimen(s) by brushing or washing,                            when performed (separate procedure)                           99153, Moderate sedation; each additional 15                            minutes intraservice time                           G0500, Moderate sedation services provided by the                            same physician or other qualified health care                            professional performing a gastrointestinal                            endoscopic service that sedation supports,                            requiring the presence of an independent trained                            observer to assist in the monitoring of the                            patient's level of consciousness and physiological                            status; initial 15 minutes of intra-service time;                            patient age 68 years or older (additional time may                            be  reported with [phone], as appropriate) Diagnosis Code(s):        --- Professional ---                           K64.4, Residual hemorrhoidal skin tags                           K64.8, Other hemorrhoids  K92.1, Melena (includes Hematochezia)                           K57.30, Diverticulosis of large intestine without                            perforation or abscess without bleeding                           Q43.8, Other specified congenital malformations of                            intestine CPT copyright 2018 [employer]. All rights reserved. The codes documented in this report are preliminary and upon coder review may  be revised to meet current compliance requirements. Barney Drain, MD Barney Drain MD, MD 08/16/2018 10:17:57 AM This report has been signed electronically. Number of Addenda: 0

## 2018-08-16 NOTE — Discharge Instructions (Signed)
THE RECTAL BLEEDING IS MOST LIKELY DUE TO small internal hemorrhoids and diverticulosis IN YOUR RIGHT AND LEFT COLON. YOU DID NOT HAVE ANY POLYPS.    DRINK WATER TO KEEP YOUR URINE LIGHT YELLOW.  FOLLOW A HIGH FIBER DIET. AVOID ITEMS THAT CAUSE BLOATING & GAS. SEE INFO BELOW.    USE PREPARATION H FOUR TIMES  A DAY IF NEEDED TO RELIEVE RECTAL PAIN/PRESSURE/BLEEDING.   FOLLOW UP IN 4 MOS WITH DR. Lorriane Dehart.  PLEASE CALL WITH QUESTIONS OR CONCERNS.  We do not routinely screen for polyps after the age of 75.    Colonoscopy Care After Read the instructions outlined below and refer to this sheet in the next week. These discharge instructions provide you with general information on caring for yourself after you leave the hospital. While your treatment has been planned according to the most current medical practices available, unavoidable complications occasionally occur. If you have any problems or questions after discharge, call DR. Latrish Mogel, [phone].  ACTIVITY  You may resume your regular activity, but move at a slower pace for the next 24 hours.   Take frequent rest periods for the next 24 hours.   Walking will help get rid of the air and reduce the bloated feeling in your belly (abdomen).   No driving for 24 hours (because of the medicine (anesthesia) used during the test).   You may shower.   Do not sign any important legal documents or operate any machinery for 24 hours (because of the anesthesia used during the test).    NUTRITION  Drink plenty of fluids.   You may resume your normal diet as instructed by your doctor.   Begin with a light meal and progress to your normal diet. Heavy or fried foods are harder to digest and may make you feel sick to your stomach (nauseated).   Avoid alcoholic beverages for 24 hours or as instructed.    MEDICATIONS  You may resume your normal medications.   WHAT YOU CAN EXPECT TODAY  Some feelings of bloating in the abdomen.    Passage of more gas than usual.   Spotting of blood in your stool or on the toilet paper  .  IF YOU HAD POLYPS REMOVED DURING THE COLONOSCOPY:  Eat a soft diet IF YOU HAVE NAUSEA, BLOATING, ABDOMINAL PAIN, OR VOMITING.    FINDING OUT THE RESULTS OF YOUR TEST Not all test results are available during your visit. DR. Oneida Alar WILL CALL YOU WITHIN 7 DAYS OF YOUR PROCEDUE WITH YOUR RESULTS. Do not assume everything is normal if you have not heard from DR. Savahanna Almendariz IN ONE WEEK, CALL HER OFFICE AT [phone].  SEEK IMMEDIATE MEDICAL ATTENTION AND CALL THE OFFICE: [phone] IF:  You have more than a spotting of blood in your stool.   Your belly is swollen (abdominal distention).   You are nauseated or vomiting.   You have a temperature over 101F.   You have abdominal pain or discomfort that is severe or gets worse throughout the day.  High-Fiber Diet A high-fiber diet changes your normal diet to include more whole grains, legumes, fruits, and vegetables. Changes in the diet involve replacing refined carbohydrates with unrefined foods. The calorie level of the diet is essentially unchanged. The Dietary Reference Intake (recommended amount) for adult males is 38 grams per day. For adult females, it is 25 grams per day. Pregnant and lactating women should consume 28 grams of fiber per day. Fiber is the intact part of a plant that  is not broken down during digestion. Functional fiber is fiber that has been isolated from the plant to provide a beneficial effect in the body. PURPOSE  Increase stool bulk.   Ease and regulate bowel movements.   Lower cholesterol.   REDUCE RISK OF COLON CANCER  INDICATIONS THAT YOU NEED MORE FIBER  Constipation and hemorrhoids.   Uncomplicated diverticulosis (intestine condition) and irritable bowel syndrome.   Weight management.   As a protective measure against hardening of the arteries (atherosclerosis), diabetes, and cancer.   GUIDELINES FOR  INCREASING FIBER IN THE DIET  Start adding fiber to the diet slowly. A gradual increase of about 5 more grams (2 slices of whole-wheat bread, 2 servings of most fruits or vegetables, or 1 bowl of high-fiber cereal) per day is best. Too rapid an increase in fiber may result in constipation, flatulence, and bloating.   Drink enough water and fluids to keep your urine clear or pale yellow. Water, juice, or caffeine-free drinks are recommended. Not drinking enough fluid may cause constipation.   Eat a variety of high-fiber foods rather than one type of fiber.   Try to increase your intake of fiber through using high-fiber foods rather than fiber pills or supplements that contain small amounts of fiber.   The goal is to change the types of food eaten. Do not supplement your present diet with high-fiber foods, but replace foods in your present diet.   INCLUDE A VARIETY OF FIBER SOURCES  Replace refined and processed grains with whole grains, canned fruits with fresh fruits, and incorporate other fiber sources. White rice, white breads, and most bakery goods contain little or no fiber.   Brown whole-grain rice, buckwheat oats, and many fruits and vegetables are all good sources of fiber. These include: broccoli, Brussels sprouts, cabbage, cauliflower, beets, sweet potatoes, white potatoes (skin on), carrots, tomatoes, eggplant, squash, berries, fresh fruits, and dried fruits.   Cereals appear to be the richest source of fiber. Cereal fiber is found in whole grains and bran. Bran is the fiber-rich outer coat of cereal grain, which is largely removed in refining. In whole-grain cereals, the bran remains. In breakfast cereals, the largest amount of fiber is found in those with "bran" in their names. The fiber content is sometimes indicated on the label.   You may need to include additional fruits and vegetables each day.   In baking, for 1 cup white flour, you may use the following substitutions:   1  cup whole-wheat flour minus 2 tablespoons.   1/2 cup white flour plus 1/2 cup whole-wheat flour.   Diverticulosis Diverticulosis is a common condition that develops when small pouches (diverticula) form in the wall of the colon. The risk of diverticulosis increases with age. It happens more often in people who eat a low-fiber diet. Most individuals with diverticulosis have no symptoms. Those individuals with symptoms usually experience belly (abdominal) pain, constipation, or loose stools (diarrhea).  HOME CARE INSTRUCTIONS  Increase the amount of fiber in your diet as directed by your caregiver or dietician. This may reduce symptoms of diverticulosis.   Drink at least 6 to 8 glasses of water each day to prevent constipation.   Try not to strain when you have a bowel movement.   Avoiding nuts and seeds to prevent complications is NOT NECESSARY.   FOODS HAVING HIGH FIBER CONTENT INCLUDE:  Fruits. Apple, peach, pear, tangerine, raisins, prunes.   Vegetables. Brussels sprouts, asparagus, broccoli, cabbage, carrot, cauliflower, romaine lettuce, spinach, summer squash,  tomato, winter squash, zucchini.   Starchy Vegetables. Baked beans, kidney beans, lima beans, split peas, lentils, potatoes (with skin).   Grains. Whole wheat bread, brown rice, bran flake cereal, plain oatmeal, white rice, shredded wheat, bran muffins.    SEEK IMMEDIATE MEDICAL CARE IF:  You develop increasing pain or severe bloating.   You have an oral temperature above 101F.   You develop vomiting or bowel movements that are bloody or black.   Hemorrhoids Hemorrhoids are dilated (enlarged) veins around the rectum. Sometimes clots will form in the veins. This makes them swollen and painful. These are called thrombosed hemorrhoids. Causes of hemorrhoids include:  Constipation.   Straining to have a bowel movement.   HEAVY LIFTING  HOME CARE INSTRUCTIONS  Eat a well balanced diet and drink 6 to 8 glasses of  water every day to avoid constipation. You may also use a bulk laxative.   Avoid straining to have bowel movements.   Keep anal area dry and clean.   Do not use a donut shaped pillow or sit on the toilet for long periods. This increases blood pooling and pain.   Move your bowels when your body has the urge; this will require less straining and will decrease pain and pressure.

## 2018-08-17 ENCOUNTER — Other Ambulatory Visit: Payer: Self-pay

## 2018-08-17 MED ORDER — FLUTICASONE PROPIONATE 50 MCG/ACT NA SUSP: NASAL | 0 refills | Status: DC

## 2018-08-22 ENCOUNTER — Ambulatory Visit: Payer: Medicare Other | Admitting: Nutrition

## 2018-08-23 ENCOUNTER — Encounter (HOSPITAL_COMMUNITY): Payer: Self-pay | Admitting: Gastroenterology

## 2018-08-28 DIAGNOSIS — H401124 Primary open-angle glaucoma, left eye, indeterminate stage: Secondary | ICD-10-CM | POA: Diagnosis not present

## 2018-08-28 DIAGNOSIS — H401111 Primary open-angle glaucoma, right eye, mild stage: Secondary | ICD-10-CM | POA: Diagnosis not present

## 2018-08-28 DIAGNOSIS — H2513 Age-related nuclear cataract, bilateral: Secondary | ICD-10-CM | POA: Diagnosis not present

## 2018-08-29 ENCOUNTER — Telehealth: Payer: Self-pay | Admitting: Family Medicine

## 2018-08-29 DIAGNOSIS — Z79899 Other long term (current) drug therapy: Secondary | ICD-10-CM

## 2018-08-29 DIAGNOSIS — Z13220 Encounter for screening for lipoid disorders: Secondary | ICD-10-CM

## 2018-08-29 DIAGNOSIS — E119 Type 2 diabetes mellitus without complications: Secondary | ICD-10-CM

## 2018-08-29 NOTE — Telephone Encounter (Signed)
Patient has a follow up appt scheduled for 09/13/2018, pt wondering if she will need blood work or not. Advise.

## 2018-08-29 NOTE — Telephone Encounter (Signed)
Last labs on 03/10/2018 hepatic, hgb a1c, microalbumin, bmet, lipid. Please advise. Thank you

## 2018-08-30 ENCOUNTER — Telehealth: Payer: Self-pay | Admitting: Family Medicine

## 2018-08-30 NOTE — Telephone Encounter (Signed)
Everything that was ordered on May 31 can be updated and given to the patient to complete before her office visit Please let the patient know that the urine test is also part of the testing

## 2018-08-30 NOTE — Telephone Encounter (Signed)
I called left a message to have the pt to r/c. I re entered the labs into Epic.

## 2018-08-30 NOTE — Telephone Encounter (Signed)
Pt returned call and made aware lab work has been updated and will also be checking urine as well.

## 2018-08-30 NOTE — Telephone Encounter (Signed)
Patient called back and was made aware that the labs were entered.

## 2018-08-31 DIAGNOSIS — H25813 Combined forms of age-related cataract, bilateral: Secondary | ICD-10-CM | POA: Diagnosis not present

## 2018-08-31 DIAGNOSIS — H34832 Tributary (branch) retinal vein occlusion, left eye, with macular edema: Secondary | ICD-10-CM | POA: Diagnosis not present

## 2018-08-31 DIAGNOSIS — Z7984 Long term (current) use of oral hypoglycemic drugs: Secondary | ICD-10-CM | POA: Diagnosis not present

## 2018-08-31 DIAGNOSIS — E113291 Type 2 diabetes mellitus with mild nonproliferative diabetic retinopathy without macular edema, right eye: Secondary | ICD-10-CM | POA: Diagnosis not present

## 2018-08-31 DIAGNOSIS — E113212 Type 2 diabetes mellitus with mild nonproliferative diabetic retinopathy with macular edema, left eye: Secondary | ICD-10-CM | POA: Diagnosis not present

## 2018-08-31 DIAGNOSIS — H401134 Primary open-angle glaucoma, bilateral, indeterminate stage: Secondary | ICD-10-CM | POA: Diagnosis not present

## 2018-09-01 ENCOUNTER — Other Ambulatory Visit: Payer: Self-pay | Admitting: Family Medicine

## 2018-09-01 ENCOUNTER — Other Ambulatory Visit: Payer: Self-pay

## 2018-09-01 MED ORDER — TRIAMTERENE-HCTZ 37.5-25 MG PO TABS: 1.0000 | ORAL_TABLET | Freq: Every day | ORAL | 1 refills | Status: DC

## 2018-09-11 DIAGNOSIS — E119 Type 2 diabetes mellitus without complications: Secondary | ICD-10-CM | POA: Diagnosis not present

## 2018-09-11 DIAGNOSIS — Z79899 Other long term (current) drug therapy: Secondary | ICD-10-CM | POA: Diagnosis not present

## 2018-09-11 DIAGNOSIS — Z13220 Encounter for screening for lipoid disorders: Secondary | ICD-10-CM | POA: Diagnosis not present

## 2018-09-12 LAB — MICROALBUMIN / CREATININE URINE RATIO
CREATININE, UR: 81 mg/dL
Microalb/Creat Ratio: 56.5 mg/g creat — ABNORMAL HIGH (ref 0.0–30.0)
Microalbumin, Urine: 45.8 ug/mL

## 2018-09-12 LAB — BASIC METABOLIC PANEL
BUN/Creatinine Ratio: 17 (ref 12–28)
BUN: 16 mg/dL (ref 8–27)
CALCIUM: 9.1 mg/dL (ref 8.7–10.3)
CO2: 23 mmol/L (ref 20–29)
CREATININE: 0.94 mg/dL (ref 0.57–1.00)
Chloride: 103 mmol/L (ref 96–106)
GFR calc Af Amer: 70 mL/min/{1.73_m2} (ref >59)
GFR calc non Af Amer: 60 mL/min/{1.73_m2} (ref >59)
GLUCOSE: 164 mg/dL — AB (ref 65–99)
Potassium: 4 mmol/L (ref 3.5–5.2)
SODIUM: 141 mmol/L (ref 134–144)

## 2018-09-12 LAB — HEPATIC FUNCTION PANEL
ALBUMIN: 4.1 g/dL (ref 3.5–4.8)
ALK PHOS: 77 IU/L (ref 39–117)
ALT: 27 IU/L (ref 0–32)
AST: 20 IU/L (ref 0–40)
BILIRUBIN TOTAL: 0.4 mg/dL (ref 0.0–1.2)
BILIRUBIN, DIRECT: 0.06 mg/dL (ref 0.00–0.40)
Total Protein: 6.3 g/dL (ref 6.0–8.5)

## 2018-09-12 LAB — LIPID PANEL
CHOLESTEROL TOTAL: 164 mg/dL (ref 100–199)
Chol/HDL Ratio: 4.1 ratio (ref 0.0–4.4)
HDL: 40 mg/dL (ref >39)
LDL Calculated: 84 mg/dL (ref 0–99)
TRIGLYCERIDES: 198 mg/dL — AB (ref 0–149)
VLDL Cholesterol Cal: 40 mg/dL (ref 5–40)

## 2018-09-12 LAB — HEMOGLOBIN A1C
Est. average glucose Bld gHb Est-mCnc: 146 mg/dL
Hgb A1c MFr Bld: 6.7 % — ABNORMAL HIGH (ref 4.8–5.6)

## 2018-09-13 ENCOUNTER — Encounter: Payer: Self-pay | Admitting: Family Medicine

## 2018-09-13 ENCOUNTER — Ambulatory Visit (INDEPENDENT_AMBULATORY_CARE_PROVIDER_SITE_OTHER): Payer: Medicare Other | Admitting: Family Medicine

## 2018-09-13 VITALS — BP 124/80 | Temp 98.6°F | Ht 64.0 in | Wt 174.0 lb

## 2018-09-13 DIAGNOSIS — E7849 Other hyperlipidemia: Secondary | ICD-10-CM | POA: Diagnosis not present

## 2018-09-13 DIAGNOSIS — R030 Elevated blood-pressure reading, without diagnosis of hypertension: Secondary | ICD-10-CM

## 2018-09-13 DIAGNOSIS — E119 Type 2 diabetes mellitus without complications: Secondary | ICD-10-CM | POA: Diagnosis not present

## 2018-09-13 DIAGNOSIS — I10 Essential (primary) hypertension: Secondary | ICD-10-CM | POA: Diagnosis not present

## 2018-09-13 MED ORDER — METFORMIN HCL 500 MG PO TABS: 1000.0000 mg | ORAL_TABLET | Freq: Two times a day (BID) | ORAL | 2 refills | Status: DC

## 2018-09-13 MED ORDER — AMOXICILLIN 500 MG PO TABS: 500.0000 mg | ORAL_TABLET | Freq: Three times a day (TID) | ORAL | 0 refills | Status: DC

## 2018-09-13 NOTE — Progress Notes (Signed)
Subjective:    Patient ID: Kara Kirby, female    DOB: 11-15-1945, 72 y.o.   MRN: [id number]  Diabetes  She presents for her follow-up diabetic visit. She has type 2 diabetes mellitus. Pertinent negatives for hypoglycemia include no confusion or dizziness. Pertinent negatives for diabetes include no chest pain, no fatigue, no polydipsia, no polyphagia and no weakness. She is compliant with treatment all of the time. Exercise: walks every day. Home blood sugar record trend: sometimes good and sometimes bad. depends on what she eats. She does not see a podiatrist.Eye exam current: last month.   Glands swollen and some sinus symptoms.  Head congestion drainage coughing sinus pressure symptoms several days  Would like something for dry skin. States legs are flaky.  Dry skin uses lotion nothing seems to help  Patient here for follow-up regarding cholesterol.  The patient does have hyperlipidemia.  Patient does try to maintain a reasonable diet.  Patient does take the medication on a regular basis.  Denies missing a dose.  The patient denies any obvious side effects.  Prior blood work results reviewed with the patient.  The patient is aware of his cholesterol goals and the need to keep it under good control to lessen the risk of disease.  Patient for blood pressure check up.  The patient does have hypertension.  The patient is on medication.  Patient relates compliance with meds. Todays BP reviewed with the patient. Patient denies issues with medication. Patient relates reasonable diet. Patient tries to minimize salt. Patient aware of BP goals.  Would like to get a muscle relaxer ( flexeril) for legs. States she has not refilled in a few years.  Patient with intermittent muscle spasms I encouraged her not to be on a muscle relaxer because of increased risk of falls and injury  Review of Systems  Constitutional: Negative for activity change, appetite change and fatigue.  HENT: Negative for  congestion and rhinorrhea.   Respiratory: Negative for cough and shortness of breath.   Cardiovascular: Negative for chest pain and leg swelling.  Gastrointestinal: Negative for abdominal pain and diarrhea.  Endocrine: Negative for polydipsia and polyphagia.  Skin: Negative for color change.  Neurological: Negative for dizziness and weakness.  Psychiatric/Behavioral: Negative for behavioral problems and confusion.       Objective:   Physical Exam  Constitutional: She appears well-nourished. No distress.  HENT:  Head: Normocephalic and atraumatic.  Eyes: Right eye exhibits no discharge. Left eye exhibits no discharge.  Neck: No tracheal deviation present.  Cardiovascular: Normal rate, regular rhythm and normal heart sounds.  No murmur heard. Pulmonary/Chest: Effort normal and breath sounds normal. No respiratory distress.  Musculoskeletal: She exhibits no edema.  Lymphadenopathy:    She has no cervical adenopathy.  Neurological: She is alert. Coordination normal.  Skin: Skin is warm and dry.  Psychiatric: She has a normal mood and affect. Her behavior is normal.  Vitals reviewed.         Assessment & Plan:  Dry skin recommend lotion  Diabetes increase of metformin if side effects notify us stop glipizide follow sugars repeat A1c in 3 months  Cholesterol good control continue current measures  Blood pressure good control continue current measures  Sinusitis antibiotics as prescribed  25 minutes was spent with the patient.  This statement verifies that 25 minutes was indeed spent with the patient.  More than 50% of this visit-total duration of the visit-was spent in counseling and coordination of care. The issues  that the patient came in for today as reflected in the diagnosis (s) please refer to documentation for further details.  Blood pressure slightly elevated systolic recheck blood pressure in 1 week bring all medications with her  History of stroke continue  antiplatelet therapy.

## 2018-09-13 NOTE — Patient Instructions (Addendum)
Results for orders placed or performed in visit on 08/29/18  Hepatic function panel  Result Value Ref Range   Total Protein 6.3 6.0 - 8.5 g/dL   Albumin 4.1 3.5 - 4.8 g/dL   Bilirubin Total 0.4 0.0 - 1.2 mg/dL   Bilirubin, Direct 0.06 0.00 - 0.40 mg/dL   Alkaline Phosphatase 77 39 - 117 IU/L   AST 20 0 - 40 IU/L   ALT 27 0 - 32 IU/L  Hemoglobin A1c  Result Value Ref Range   Hgb A1c MFr Bld 6.7 (H) 4.8 - 5.6 %   Est. average glucose Bld gHb Est-mCnc 146 mg/dL  Microalbumin / creatinine urine ratio  Result Value Ref Range   Creatinine, Urine 81.0 Not Estab. mg/dL   Microalbumin, Urine 45.8 Not Estab. ug/mL   Microalb/Creat Ratio 56.5 (H) 0.0 - 30.0 mg/g creat  Basic metabolic panel  Result Value Ref Range   Glucose 164 (H) 65 - 99 mg/dL   BUN 16 8 - 27 mg/dL   Creatinine, Ser 0.94 0.57 - 1.00 mg/dL   GFR calc non Af Amer 60 >59 mL/min/1.73   GFR calc Af Amer 70 >59 mL/min/1.73   BUN/Creatinine Ratio 17 12 - 28   Sodium 141 134 - 144 mmol/L   Potassium 4.0 3.5 - 5.2 mmol/L   Chloride 103 96 - 106 mmol/L   CO2 23 20 - 29 mmol/L   Calcium 9.1 8.7 - 10.3 mg/dL  Lipid panel  Result Value Ref Range   Cholesterol, Total 164 100 - 199 mg/dL   Triglycerides 198 (H) 0 - 149 mg/dL   HDL 40 >39 mg/dL   VLDL Cholesterol Cal 40 5 - 40 mg/dL   LDL Calculated 84 0 - 99 mg/dL   Chol/HDL Ratio 4.1 0.0 - 4.4 ratio   Metformin increase to 2 tablets twice a day Stop glipizide  Recheck BP in 1 week

## 2018-09-18 ENCOUNTER — Other Ambulatory Visit: Payer: Self-pay | Admitting: Family Medicine

## 2018-09-18 ENCOUNTER — Encounter: Payer: Medicare Other | Attending: Family Medicine | Admitting: Nutrition

## 2018-09-18 VITALS — Ht 64.0 in | Wt 176.0 lb

## 2018-09-18 DIAGNOSIS — IMO0002 Reserved for concepts with insufficient information to code with codable children: Secondary | ICD-10-CM

## 2018-09-18 DIAGNOSIS — E669 Obesity, unspecified: Secondary | ICD-10-CM | POA: Insufficient documentation

## 2018-09-18 DIAGNOSIS — E118 Type 2 diabetes mellitus with unspecified complications: Secondary | ICD-10-CM | POA: Diagnosis not present

## 2018-09-18 DIAGNOSIS — E1165 Type 2 diabetes mellitus with hyperglycemia: Secondary | ICD-10-CM | POA: Insufficient documentation

## 2018-09-18 NOTE — Patient Instructions (Signed)
Goals 1. Avoid bacon and bologna 2. Increase fresh fruits and vegetables 3. Increase water 4. Walk 30 minutes 3-4 times per week.

## 2018-09-18 NOTE — Progress Notes (Signed)
Medical Nutrition Therapy:  Appt start time: 1030  end time: 1100 Assessment:  Primary concerns today: Diabetes Type 2. F/u   A1C 6.7% FBS 95-120's.s  She doesn't check every day .Has been walking some and working in yard for exercise.   Saw Dr. Sallee Lange  Last week. Stopped GLipizide and increased Metformin 500 mg BID.  Wt Readings from Last 3 Encounters:  09/20/18 174 lb (78.9 kg)  09/18/18 176 lb (79.8 kg)  09/13/18 174 lb (78.9 kg)   Ht Readings from Last 3 Encounters:  09/20/18 5\' 4"  (1.626 m)  09/18/18 5\' 4"  (1.626 m)  09/13/18 5\' 4"  (1.626 m)   Body mass index is 30.21 kg/m. CMP Latest Ref Rng & Units 09/11/2018 10/18/2017 06/02/2017  Glucose 65 - 99 mg/dL 164(H) 166(H) 123(H)  BUN 8 - 27 mg/dL 16 14 14   Creatinine 0.57 - 1.00 mg/dL 0.94 0.99 0.93  Sodium 134 - 144 mmol/L 141 143 143  Potassium 3.5 - 5.2 mmol/L 4.0 3.7 3.6  Chloride 96 - 106 mmol/L 103 101 101  CO2 20 - 29 mmol/L 23 26 26   Calcium 8.7 - 10.3 mg/dL 9.1 9.8 10.0  Total Protein 6.0 - 8.5 g/dL 6.3 6.9 -  Total Bilirubin 0.0 - 1.2 mg/dL 0.4 0.5 -  Alkaline Phos 39 - 117 IU/L 77 75 -  AST 0 - 40 IU/L 20 17 -  ALT 0 - 32 IU/L 27 23 -   Lipid Panel     Component Value Date/Time   CHOL 164 09/11/2018 0933   TRIG 198 (H) 09/11/2018 0933   HDL 40 09/11/2018 0933   CHOLHDL 4.1 09/11/2018 0933   CHOLHDL 3.3 05/27/2014 1041   VLDL 16 05/27/2014 1041   LDLCALC 84 09/11/2018 0933   Lab Results  Component Value Date   HGBA1C 6.7 (H) 09/11/2018     CMP Latest Ref Rng & Units 09/11/2018 10/18/2017 06/02/2017  Glucose 65 - 99 mg/dL 164(H) 166(H) 123(H)  BUN 8 - 27 mg/dL 16 14 14   Creatinine 0.57 - 1.00 mg/dL 0.94 0.99 0.93  Sodium 134 - 144 mmol/L 141 143 143  Potassium 3.5 - 5.2 mmol/L 4.0 3.7 3.6  Chloride 96 - 106 mmol/L 103 101 101  CO2 20 - 29 mmol/L 23 26 26   Calcium 8.7 - 10.3 mg/dL 9.1 9.8 10.0  Total Protein 6.0 - 8.5 g/dL 6.3 6.9 -  Total Bilirubin 0.0 - 1.2 mg/dL 0.4 0.5 -  Alkaline Phos 39 - 117  IU/L 77 75 -  AST 0 - 40 IU/L 20 17 -  ALT 0 - 32 IU/L 27 23 -   Lab Results  Component Value Date   HGBA1C 6.7 (H) 09/11/2018       Preferred Learning Style:  No preference indicated   Learning Readiness:   Ready  Change in progress  MEDICATIONS:   DIETARY INTAKE:  24-hr recall:  B ( AM): Oatmeal, raisins and 1 slice bacon,  Water or coffee Snk ( AM):  L ( PM):hadn't had lunch today; hamburger with tomatoes, lettuce, with bun,  Snk ( PM):  D ( PM): Vegetables chicken soup,   Bologna sandwich, water  Snk ( PM):  Beverages: water  Usual physical activity:  Walking 30 minutes and dance class, yoga,   Estimated energy needs: 1200  calories 135  g carbohydrates 90 g protein 33 g fat  Progress Towards Goal(s):  In progress.   Nutritional Diagnosis:  NB-1.1 Food and nutrition-related knowledge deficit As related  to Diabetes.  As evidenced by A1C > 7.5% .    Intervention:  Nutrition and Diabetes education provided on My Plate, CHO counting, meal planning, portion sizes, timing of meals, avoiding snacks between meals unless having a low blood sugar, target ranges for A1C and blood sugars, signs/symptoms and treatment of hyper/hypoglycemia, monitoring blood sugars, taking medications as prescribed, benefits of exercising 30 minutes per day and prevention of complications of DM.  Goals 1. Avoid bacon and bologna 2. Increase fresh fruits and vegetables 3. Increase water 4. Walk 30 minutes 3-4 times per week.  Teaching Method Utilized:  Visual Auditory Hands on  Handouts given during visit include:  The Plate Method  Meal Plan Card  Barriers to learning/adherence to lifestyle change: none  Demonstrated degree of understanding via:  Teach Back   Monitoring/Evaluation:  Dietary intake, exercise, meal planning, SBG, and body weight in 3  Month(s). May consider stopping Glipizide and increase Metformin for greater benefit with her age.

## 2018-09-20 ENCOUNTER — Encounter: Payer: Self-pay | Admitting: Family Medicine

## 2018-09-20 ENCOUNTER — Ambulatory Visit (INDEPENDENT_AMBULATORY_CARE_PROVIDER_SITE_OTHER): Payer: Medicare Other | Admitting: Family Medicine

## 2018-09-20 VITALS — BP 134/76 | Ht 64.0 in | Wt 174.0 lb

## 2018-09-20 DIAGNOSIS — I10 Essential (primary) hypertension: Secondary | ICD-10-CM

## 2018-09-20 DIAGNOSIS — R0981 Nasal congestion: Secondary | ICD-10-CM | POA: Diagnosis not present

## 2018-09-20 NOTE — Progress Notes (Signed)
   Subjective:    Patient ID: Kara Kirby, female    DOB: September 26, 1946, 72 y.o.   MRN: [id number]  HPI  Patient is here today to follow up on her blood pressure. She says she has not taken her bp at home as she has no machine.She is taking Norvasc 10 mg one po Qd, Cardura 4 mg Qhs,Triamterene- HCTZ 37.5-25 one per day.   She says she has sinus drainage and was given and antibotic amoxicillin TID and has not finished it. She says she tries to eat healthy and gets exercise.  Review of Systems  Constitutional: Negative for activity change, appetite change and fatigue.  HENT: Negative for congestion and rhinorrhea.   Respiratory: Negative for cough and shortness of breath.   Cardiovascular: Negative for chest pain and leg swelling.  Gastrointestinal: Negative for abdominal pain and diarrhea.  Endocrine: Negative for polydipsia and polyphagia.  Skin: Negative for color change.  Neurological: Negative for dizziness and weakness.  Psychiatric/Behavioral: Negative for behavioral problems and confusion.       Objective:   Physical Exam  Constitutional: She appears well-nourished. No distress.  HENT:  Head: Normocephalic and atraumatic.  Eyes: Right eye exhibits no discharge. Left eye exhibits no discharge.  Neck: No tracheal deviation present.  Cardiovascular: Normal rate, regular rhythm and normal heart sounds.  No murmur heard. Pulmonary/Chest: Effort normal and breath sounds normal. No respiratory distress.  Musculoskeletal: She exhibits no edema.  Lymphadenopathy:    She has no cervical adenopathy.  Neurological: She is alert. Coordination normal.  Skin: Skin is warm and dry.  Psychiatric: She has a normal mood and affect. Her behavior is normal.  Vitals reviewed.         Assessment & Plan:  I reviewed overall of her medicine Blood pressure very good today Continue current medications No changes in medicine Patient will follow-up in approximately 3 to 4  months  Sinus congestion I believe she is over the infection I believe is just mainly congestion I do not recommend any type of antibiotics currently

## 2018-09-25 ENCOUNTER — Encounter: Payer: Self-pay | Admitting: Nutrition

## 2018-09-29 ENCOUNTER — Telehealth: Payer: Self-pay | Admitting: Family Medicine

## 2018-09-29 ENCOUNTER — Other Ambulatory Visit: Payer: Self-pay | Admitting: Family Medicine

## 2018-09-29 MED ORDER — AMOXICILLIN 500 MG PO CAPS: ORAL_CAPSULE | ORAL | 0 refills | Status: DC

## 2018-09-29 NOTE — Telephone Encounter (Signed)
Pt returned call and was informed that amoxicillin has been sent in. Pt verbalized understanding

## 2018-09-29 NOTE — Telephone Encounter (Signed)
amox 500 tid ten d 

## 2018-09-29 NOTE — Telephone Encounter (Signed)
Pt saw Dr.Scott last Wednesday 09/20/2018 and is not feeling any better, pt states she still has same symptoms, pt states at last appt there was a discussion that she could start a another medication, pt would like to start another medication if possible.    Pharmacy:  [facility] [address] [place] [address]

## 2018-09-29 NOTE — Telephone Encounter (Signed)
Please advise. Thank you

## 2018-09-29 NOTE — Telephone Encounter (Signed)
Medication sent in. Left message to return call 

## 2018-10-23 ENCOUNTER — Other Ambulatory Visit: Payer: Self-pay

## 2018-10-23 DIAGNOSIS — I6523 Occlusion and stenosis of bilateral carotid arteries: Secondary | ICD-10-CM

## 2018-10-24 ENCOUNTER — Other Ambulatory Visit: Payer: Self-pay | Admitting: Family Medicine

## 2018-10-27 ENCOUNTER — Telehealth: Payer: Self-pay | Admitting: Family Medicine

## 2018-10-27 NOTE — Telephone Encounter (Signed)
Please advise. Thank you

## 2018-10-27 NOTE — Telephone Encounter (Signed)
Pt dropped off envelope in regards of her glucose, placed in red folder in Dr.Scott's box.

## 2018-10-31 ENCOUNTER — Other Ambulatory Visit: Payer: Self-pay | Admitting: Family Medicine

## 2018-10-31 ENCOUNTER — Encounter: Payer: Self-pay | Admitting: Gastroenterology

## 2018-10-31 NOTE — Telephone Encounter (Signed)
Discussed with pt. Pt verbalized understanding. I printed off some glucose logs and left at the front for pt to pickup. She also wanted me to ask dr Nicki Reaper if she should continue taking the glipizide. I looked at med list and this med was not on her list. She then told me it was stopped because she is now taking metformin 500mg  2 bid but she still takes one half of the 5mg  of glipizide in the evening if her blood sugar is above 200.

## 2018-10-31 NOTE — Telephone Encounter (Signed)
Patient advised Dr Nicki Reaper does not blame her for doing so but the whole idea behind her checking some sugars at various times of the day is to get a handle for the scope with this issue so Dr Nicki Reaper can guide her from a scientific/medical point of view on the medication rather than sometimes taking the glipizide sometimes not  If at all possible please have the patient just do the metformin in the diet follow her sugars at least over the next 7 to 10 days then send [place] those readings in approximately 10 days then Dr Nicki Reaper can give her a much more scientific point of view on what to do.   Patient verbalized understanding.

## 2018-10-31 NOTE — Telephone Encounter (Signed)
I do not blame her for doing so but the whole idea behind her checking some sugars at various times of the day is to get a handle for the scope with this issue so I can guide her from a scientific/medical point of view on the medication rather than sometimes taking the glipizide sometimes not  If at all possible please have the patient just do the metformin in the diet follow her sugars at least over the next 7 to 10 days then send [place] those readings in approximately 10 days then I can give her a much more scientific point of view on what to do

## 2018-10-31 NOTE — Telephone Encounter (Signed)
Left message to return call 

## 2018-10-31 NOTE — Telephone Encounter (Signed)
Please let the patient know that I did review over her glucose readings  I agree with the patient that some of her readings are higher than what I would like to see  I would recommend moving forward that she check her sugar a few times per week in the morning such as 3 days a week in the morning And check her sugar a couple times a week before supper and once a week before bedtime  Do this over the course of the next couple weeks then send [place] readings  Provide her with glucose log sheets if she needs them  We may be making adjustments on her medicine based upon those readings thank you Patient has a follow-up office visit in April to keep that thank you

## 2018-11-02 ENCOUNTER — Encounter: Payer: Medicare Other | Attending: Family Medicine | Admitting: Nutrition

## 2018-11-02 NOTE — Progress Notes (Incomplete)
B) Oatmeal with raisin and 1 slice , 4 oz coffee-splenda L) Roasted chicken sandwich 6",  Water D) 2 cuties,; meat and vegetables.  FBS 145-

## 2018-11-29 NOTE — Progress Notes (Signed)
HISTORY AND PHYSICAL     CC:  follow up. Requesting Provider:  Kathyrn Drown, MD  HPI: This is a 73 y.o. female here for follow up for carotid artery stenosis.  Pt is s/p right CEA by Dr. Scot Dock in 2000.  She subsequently underwent right carotid stent placement in January 2017 by Dr. Oneida Alar.   Pt was last seen in August 2018 by Dr. Oneida Alar.  At that time she was symptomatic.  Prior to her CEA, she had a right brain stroke with left leg weakness that resolved. She was on plavix.   She presents today and is doing well.  She denies any amaurosis fugax, facial droop, speech difficulties, clumsiness or hemiparesis.  She states she has had some trouble with her right eye with a blood vessel that burst and she has open angle glaucoma and is being treated for this at [facility].    She continues to take her plavix and asa and statin.  She has hx of mild renal insufficiency.  She states that she has a cough.   The pt is on a statin for cholesterol management.  The pt is diabetic.   The pt is on CCB for hypertension.   Tobacco hx:  never The pt is on a daily aspirin. Other AC:  Plavix    Past Medical History:  Diagnosis Date  . Allergy   . Arthritis    Left shoulder  . Carotid artery occlusion   . CVA (cerebral vascular accident) ([place]) 02-19-13  . DM (diabetes mellitus) ([place])   . Dyslipidemia   . GERD (gastroesophageal reflux disease)    intermittent Sx  . Hematuria   . Hip arthritis 2003   Left  . HTN (hypertension)   . Hyperlipidemia   . Renal insufficiency, mild   . Sleep related leg cramps     Past Surgical History:  Procedure Laterality Date  . CAROTID ENDARTERECTOMY  RIGHT  . COLONOSCOPY    . COLONOSCOPY  10/19/2011   Procedure: COLONOSCOPY;  Surgeon: Dorothyann Peng, MD;  Location: [facility];  Service: Endoscopy;  Laterality: N/A;  10:15  . COLONOSCOPY N/A 08/16/2018   Procedure: COLONOSCOPY;  Surgeon: Danie Binder, MD;  Location: [facility];  Service: Endoscopy;   Laterality: N/A;  9:30am  . PERIPHERAL VASCULAR CATHETERIZATION Right 10/17/2015   Procedure: Carotid PTA/Stent Intervention;  Surgeon: Elam Dutch, MD;  Location: [place] [facility];  Service: Cardiovascular;  Laterality: Right;  . PERIPHERAL VASCULAR CATHETERIZATION Right 10/17/2015   Procedure: Carotid Angiography;  Surgeon: Elam Dutch, MD;  Location: [place] [facility];  Service: Cardiovascular;  Laterality: Right;  . S/P Hysterectomy    . TOTAL ABDOMINAL HYSTERECTOMY     FIBROID, HYPERMENORRHEA    Allergies  Allergen Reactions  . Other Hives    IV DYE   . Iodinated Diagnostic Agents Hives    Pre-medicate with benadryl and prednisone  . Lisinopril     Angioedema of tongue July 2017    Current Outpatient Medications  Medication Sig Dispense Refill  . acetaminophen (TYLENOL) 500 MG tablet Take 500-1,000 mg by mouth 2 (two) times daily as needed for mild pain.    Marland Kitchen amLODipine (NORVASC) 10 MG tablet TAKE 1 TABLET BY MOUTH ONCE DAILY FOR BLOOD PRESSURE (Patient taking differently: Take 10 mg by mouth daily. ) 90 tablet 1  . aspirin 81 MG tablet Take 81 mg by mouth daily.    Marland Kitchen atorvastatin (LIPITOR) 80 MG tablet TAKE  1 TABLET BY MOUTH ONCE DAILY (Patient taking differently: Take 80 mg by mouth daily at 6 PM. ) 90 tablet 1  . clopidogrel (PLAVIX) 75 MG tablet Take 1 tablet (75 mg total) by mouth daily. 90 tablet 0  . doxazosin (CARDURA) 4 MG tablet TAKE 1 TABLET BY MOUTH ONCE DAILY AT BEDTIME 90 tablet 0  . famotidine (PEPCID) 20 MG tablet Take 1 tablet (20 mg total) by mouth 2 (two) times daily. 60 tablet 0  . fluticasone (FLONASE) 50 MCG/ACT nasal spray USE TWO SPRAY(S) IN EACH NOSTRIL ONCE DAILY 16 g 0  . glucose blood (ONE TOUCH ULTRA TEST) test strip USE ONE STRIP TO CHECK GLUCOSE ONCE DAILY. ICD - 10 code E11.9 50 each 5  . latanoprost (XALATAN) 0.005 % ophthalmic solution Place 1 drop into both eyes at bedtime.    . metFORMIN (GLUCOPHAGE) 500 MG tablet Take 2 tablets  (1,000 mg total) by mouth 2 (two) times daily with a meal. 360 tablet 2  . Multiple Vitamin (MULTIVITAMIN) tablet Take 1 tablet by mouth daily.    . nitroGLYCERIN (NITROSTAT) 0.4 MG SL tablet DISSOLVE ONE TABLET UNDER THE TONGUE EVERY 5 MINUTES AS NEEDED FOR CHEST PAIN.  DO NOT EXCEED A TOTAL OF 3 DOSES IN 15 MINUTES (Patient taking differently: Place 0.4 mg under the tongue every 5 (five) minutes as needed for chest pain. DISSOLVE ONE TABLET UNDER THE TONGUE EVERY 5 MINUTES AS NEEDED FOR CHEST PAIN.  DO NOT EXCEED A TOTAL OF 3 DOSES IN 15 MINUTES) 25 tablet 0  . timolol (TIMOPTIC) 0.5 % ophthalmic solution Place 1 drop into both eyes daily.    Marland Kitchen triamterene-hydrochlorothiazide (MAXZIDE-25) 37.5-25 MG tablet Take 1 tablet by mouth daily. **TAKING  PLACE  OF  CHLORTHALIDONE** 90 tablet 1  . VENTOLIN HFA 108 (90 Base) MCG/ACT inhaler INHALE TWO PUFFS BY MOUTH EVERY 4 HOURS AS NEEDED (Patient taking differently: Inhale 2 puffs into the lungs every 4 (four) hours as needed for wheezing or shortness of breath. ) 18 each 2   No current facility-administered medications for this visit.     Family History  Problem Relation Age of Onset  . Hypertension Mother   . Heart attack Mother   . Heart disease Mother   . Hypertension Sister   . Diabetes Sister        Amputation  . Hyperlipidemia Sister   . Heart attack Sister   . Diabetes Sister   . Hypertension Sister   . Heart disease Sister        Before age 63  . Heart disease Father   . Colon cancer Neg Hx   . Colon polyps Neg Hx     Social History   Socioeconomic History  . Marital status: Divorced    Spouse name: Not on file  . Number of children: Not on file  . Years of education: Not on file  . Highest education level: Not on file  Occupational History  . Not on file  Social Needs  . Financial resource strain: Not on file  . Food insecurity:    Worry: Not on file    Inability: Not on file  . Transportation needs:    Medical: Not on  file    Non-medical: Not on file  Tobacco Use  . Smoking status: Never Smoker  . Smokeless tobacco: Never Used  Substance and Sexual Activity  . Alcohol use: No    Alcohol/week: 0.0 standard drinks  . Drug use: No  .  Sexual activity: Not on file  Lifestyle  . Physical activity:    Days per week: Not on file    Minutes per session: Not on file  . Stress: Not on file  Relationships  . Social connections:    Talks on phone: Not on file    Gets together: Not on file    Attends religious service: Not on file    Active member of club or organization: Not on file    Attends meetings of clubs or organizations: Not on file    Relationship status: Not on file  . Intimate partner violence:    Fear of current or ex partner: Not on file    Emotionally abused: Not on file    Physically abused: Not on file    Forced sexual activity: Not on file  Other Topics Concern  . 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, BELONGS TO [place] [employer] [place])     REVIEW OF SYSTEMS:   [X]  denotes positive finding, [ ]  denotes negative finding Cardiac  Comments:  Chest pain or chest pressure:    Shortness of breath upon exertion:    Short of breath when lying flat:    Irregular heart rhythm:        Vascular    Pain in calf, thigh, or hip brought on by ambulation:    Pain in feet at night that wakes you up from your sleep:     Blood clot in your veins:    Leg swelling:         Pulmonary    Oxygen at home:    Productive cough:     Wheezing:         Neurologic    Sudden weakness in arms or legs:     Sudden numbness in arms or legs:     Sudden onset of difficulty speaking or slurred speech:    Temporary loss of vision in one eye:     Problems with dizziness:         Gastrointestinal    Blood in stool:     Vomited blood:         Genitourinary    Burning when urinating:     Blood in urine:        Psychiatric    Major depression:         Hematologic     Bleeding problems:    Problems with blood clotting too easily:        Skin    Rashes or ulcers:        Constitutional    Fever or chills:      PHYSICAL EXAMINATION:  Today's Vitals   11/30/18 1127  BP: (!) 182/86  Pulse: 73  Resp: 14  Temp: 97.9 F (36.6 C)  TempSrc: Oral  SpO2: 98%  Weight: 170 lb 4.9 oz (77.3 kg)  Height: 5\' 4"  (1.626 m)   Body mass index is 29.23 kg/m.   General:  WDWN in NAD; vital signs documented above Gait: Not observed HENT: WNL, normocephalic Pulmonary: normal non-labored breathing , without Rales, rhonchi,  wheezing Cardiac: regular HR, without  Murmurs, rubs or gallops; without carotid bruits Abdomen: soft, NT, no masses Skin: without rashes Vascular Exam/Pulses:  Right Left  Radial 2+ (normal) 2+ (normal)  Popliteal Unable to palpate  Unable to palpate   DP 2+ (normal) 2+ (normal)  PT Unable to palpate  Unable to palpate  Extremities: without ischemic changes, without Gangrene , without cellulitis; without open wounds;  Musculoskeletal: no muscle wasting or atrophy  Neurologic: A&O X 3 Psychiatric:  The pt has Normal affect.   Non-Invasive Vascular Imaging:   Carotid Duplex on 11/30/2018: Right:  Patent right ICA stent Left:  40-59% stenosis Vertebrals:  Bilateral vertebral arteries demonstrate antegrade flow. Subclavians: Normal flow hemodynamics were seen in bilateral subclavian arteries.  Previous Carotid duplex on 05/26/18: Right: patent right ICA stent; velocities WNL Left:   40-59% stenosis No significant change from August 2017   ASSESSMENT/PLAN:: 73 y.o. female here for follow up carotid artery stenosis with hx of right CEA by Dr. Scot Dock in 2000.  She subsequently underwent right carotid stent placement in January 2017 by Dr. Oneida Alar.   -pt doing well and remains asymptomatic.   -continue plavix/asa/statin -discussed s/s of stroke with pt and they understand should they develop any of these sx, they will go  to the nearest ER. -f/u in one year with carotid duplex.   -she will call sooner if she has any issues.  Reassured her that her position of sleep will not affect her stent.   Leontine Locket, PA-C [facility] [phone]   Clinic MD:  Oneida Alar

## 2018-11-30 ENCOUNTER — Ambulatory Visit (INDEPENDENT_AMBULATORY_CARE_PROVIDER_SITE_OTHER): Payer: Medicare Other | Admitting: Physician Assistant

## 2018-11-30 ENCOUNTER — Encounter: Payer: Self-pay | Admitting: Family

## 2018-11-30 ENCOUNTER — Ambulatory Visit (HOSPITAL_COMMUNITY)
Admission: RE | Admit: 2018-11-30 | Discharge: 2018-11-30 | Disposition: A | Discharge status: Home / Self Care | Payer: Medicare Other | Source: Ambulatory Visit | Attending: Family | Admitting: Family

## 2018-11-30 VITALS — BP 182/86 | HR 73 | Temp 97.9°F | Resp 14 | Ht 64.0 in | Wt 170.3 lb

## 2018-11-30 DIAGNOSIS — I6523 Occlusion and stenosis of bilateral carotid arteries: Secondary | ICD-10-CM | POA: Diagnosis not present

## 2018-12-16 ENCOUNTER — Other Ambulatory Visit: Payer: Self-pay | Admitting: Family Medicine

## 2019-01-03 ENCOUNTER — Ambulatory Visit: Payer: Medicare Other | Admitting: Nutrition

## 2019-01-04 ENCOUNTER — Other Ambulatory Visit: Payer: Self-pay | Admitting: Family Medicine

## 2019-01-10 ENCOUNTER — Other Ambulatory Visit: Payer: Self-pay | Admitting: Family Medicine

## 2019-01-24 ENCOUNTER — Ambulatory Visit: Payer: Medicare Other | Admitting: Family Medicine

## 2019-02-04 ENCOUNTER — Other Ambulatory Visit: Payer: Self-pay | Admitting: Family Medicine

## 2019-02-22 DIAGNOSIS — H3554 Dystrophies primarily involving the retinal pigment epithelium: Secondary | ICD-10-CM | POA: Diagnosis not present

## 2019-02-22 DIAGNOSIS — H40003 Preglaucoma, unspecified, bilateral: Secondary | ICD-10-CM | POA: Diagnosis not present

## 2019-02-22 DIAGNOSIS — H34832 Tributary (branch) retinal vein occlusion, left eye, with macular edema: Secondary | ICD-10-CM | POA: Diagnosis not present

## 2019-02-22 DIAGNOSIS — E113293 Type 2 diabetes mellitus with mild nonproliferative diabetic retinopathy without macular edema, bilateral: Secondary | ICD-10-CM | POA: Diagnosis not present

## 2019-02-22 DIAGNOSIS — H25813 Combined forms of age-related cataract, bilateral: Secondary | ICD-10-CM | POA: Diagnosis not present

## 2019-03-04 ENCOUNTER — Other Ambulatory Visit: Payer: Self-pay | Admitting: Family Medicine

## 2019-03-06 ENCOUNTER — Telehealth: Payer: Self-pay | Admitting: Nutrition

## 2019-03-06 ENCOUNTER — Encounter: Payer: Medicare Other | Attending: Nutrition | Admitting: Nutrition

## 2019-03-06 NOTE — Telephone Encounter (Signed)
VM left. Pt. Went to office instead of telephone visit. Will call back later. VM left.

## 2019-03-07 ENCOUNTER — Other Ambulatory Visit: Payer: Self-pay

## 2019-03-07 ENCOUNTER — Encounter: Payer: Medicare Other | Attending: Family Medicine | Admitting: Nutrition

## 2019-03-07 DIAGNOSIS — E1165 Type 2 diabetes mellitus with hyperglycemia: Secondary | ICD-10-CM

## 2019-03-07 DIAGNOSIS — E118 Type 2 diabetes mellitus with unspecified complications: Secondary | ICD-10-CM | POA: Insufficient documentation

## 2019-03-07 DIAGNOSIS — IMO0002 Reserved for concepts with insufficient information to code with codable children: Secondary | ICD-10-CM

## 2019-03-07 NOTE — Progress Notes (Signed)
  Medical Nutrition Therapy:  Appt start time: 1400 end time:  1430.   Assessment:  Primary concerns today: Diabetes Type 2. She went to office instead of taking telephone visit at home.  FBS 145 mg/dl  Bedtime 150-170's. Metformin 1000 mg BID. Walks and mows her yard..  She notes she is watching portions and avoiding snacks between meals. Suppose to see Dr. Luan Pulling in July 2020.  Lab Results  Component Value Date   HGBA1C 6.7 (H) 09/11/2018   CMP Latest Ref Rng & Units 09/11/2018 10/18/2017 06/02/2017  Glucose 65 - 99 mg/dL 164(H) 166(H) 123(H)  BUN 8 - 27 mg/dL 16 14 14   Creatinine 0.57 - 1.00 mg/dL 0.94 0.99 0.93  Sodium 134 - 144 mmol/L 141 143 143  Potassium 3.5 - 5.2 mmol/L 4.0 3.7 3.6  Chloride 96 - 106 mmol/L 103 101 101  CO2 20 - 29 mmol/L 23 26 26   Calcium 8.7 - 10.3 mg/dL 9.1 9.8 10.0  Total Protein 6.0 - 8.5 g/dL 6.3 6.9 -  Total Bilirubin 0.0 - 1.2 mg/dL 0.4 0.5 -  Alkaline Phos 39 - 117 IU/L 77 75 -  AST 0 - 40 IU/L 20 17 -  ALT 0 - 32 IU/L 27 23 -        Preferred Learning Style:    No preference indicated   Learning Readiness:     Ready  Change in progress   MEDICATIONS: see list   DIETARY INTAKE:    24-hr recall:  B) Oatmeal with raisin and 1 slice , 4 oz coffee-splenda L) Roasted chicken sandwich 6",  Water D) 2 cuties,; meat and vegetables.  Beverages: water,   Usual physical activity:   Estimated energy needs: 1500  calories 170 g carbohydrates 112 g protein 42g fat  Progress Towards Goal(s):  In progress.Goals Nutritional Diagnosis:  NB-1.1 Food and nutrition-related knowledge deficit As related to Diabetes Type 2.  As evidenced by A1C .    Intervention:  Nutrition and Diabetes education provided on My Plate, CHO counting, meal planning, portion sizes, timing of meals, avoiding snacks between meals unless having a low blood sugar, target ranges for A1C and blood sugars, signs/symptoms and treatment of hyper/hypoglycemia,  monitoring blood sugars, taking medications as prescribed, benefits of exercising 30 minutes per day and prevention of complications of DM. Marland Kitchen Continue to build on exercise daily Stick to 2-3 carb choices per meal Keep up the great job! Be sure to get plenty of lower carb veggies with lunch and dinner. Get A1C to 6% or less.    Teaching Method Utilized:   Auditory  Handouts given during visit include:  Verbally went over Plate Method, CHO counting and meal planning   Barriers to learning/adherence to lifestyle change: none  Demonstrated degree of understanding via:  Teach Back   Monitoring/Evaluation:  Dietary intake, exercise, , and body weight in 3 month(s).

## 2019-03-08 NOTE — Telephone Encounter (Signed)
Schedule virtual visit for June May have 1 refill

## 2019-03-21 ENCOUNTER — Encounter: Payer: Self-pay | Admitting: Nutrition

## 2019-03-21 NOTE — Patient Instructions (Signed)
Goals Continue to build on exercise daily Stick to 2-3 carb choices per meal Keep up the great job! Be sure to get plenty of lower carb veggies with lunch and dinner. Get A1C to 6% or less.

## 2019-03-28 ENCOUNTER — Other Ambulatory Visit: Payer: Self-pay

## 2019-03-28 ENCOUNTER — Encounter: Payer: Self-pay | Admitting: Family Medicine

## 2019-03-28 ENCOUNTER — Ambulatory Visit (INDEPENDENT_AMBULATORY_CARE_PROVIDER_SITE_OTHER): Payer: Medicare Other | Admitting: Family Medicine

## 2019-03-28 VITALS — Wt 170.0 lb

## 2019-03-28 DIAGNOSIS — E7849 Other hyperlipidemia: Secondary | ICD-10-CM

## 2019-03-28 DIAGNOSIS — E1159 Type 2 diabetes mellitus with other circulatory complications: Secondary | ICD-10-CM | POA: Diagnosis not present

## 2019-03-28 DIAGNOSIS — I10 Essential (primary) hypertension: Secondary | ICD-10-CM

## 2019-03-28 MED ORDER — ATORVASTATIN CALCIUM 80 MG PO TABS: 80.0000 mg | ORAL_TABLET | Freq: Every day | ORAL | 1 refills | Status: DC

## 2019-03-28 MED ORDER — CLOPIDOGREL BISULFATE 75 MG PO TABS: 75.0000 mg | ORAL_TABLET | Freq: Every day | ORAL | 1 refills | Status: DC

## 2019-03-28 MED ORDER — DOXAZOSIN MESYLATE 4 MG PO TABS: 4.0000 mg | ORAL_TABLET | Freq: Every day | ORAL | 1 refills | Status: DC

## 2019-03-28 NOTE — Progress Notes (Signed)
Subjective:    Patient ID: Kara Kirby, female    DOB: Aug 10, 1946, 73 y.o.   MRN: [id number]  Hypertension This is a chronic problem. Associated symptoms include headaches. Pertinent negatives include no chest pain or shortness of breath. Risk factors for coronary artery disease include diabetes mellitus. There are no compliance problems.   Diabetes She presents for her follow-up diabetic visit. She has type 2 diabetes mellitus. Hypoglycemia symptoms include headaches. Pertinent negatives for hypoglycemia include no confusion or dizziness. There are no diabetic associated symptoms. Pertinent negatives for diabetes include no chest pain, no fatigue, no polydipsia, no polyphagia and no weakness. There are no hypoglycemic complications. There are no diabetic complications. Risk factors for coronary artery disease include hypertension. She does not see a podiatrist.Eye exam is current.   The patient was seen today as part of a comprehensive diabetic check up.the patient does have diabetes.  The patient follows here on a regular basis.  The patient relates medication compliance. No significant side effects to the medications. Denies any low glucose spells. Relates compliance with diet to a reasonable level. Patient does do labwork intermittently and understands the dangers of diabetes.  Patient here for follow-up regarding cholesterol.  The patient does have hyperlipidemia.  Patient does try to maintain a reasonable diet.  Patient does take the medication on a regular basis.  Denies missing a dose.  The patient denies any obvious side effects.  Prior blood work results reviewed with the patient.  The patient is aware of his cholesterol goals and the need to keep it under good control to lessen the risk of disease.  Pt is having sinus issues. Pt states she has had a headache, cough, clear mucus, and hoarseness. Pt states this has been going on for a few weeks.   Virtual Visit via Video Note  I  connected with Kara Kirby on 03/28/19 at  9:00 AM EDT by a video enabled telemedicine application and verified that I am speaking with the correct person using two identifiers.  Location: Patient: home Provider: office   I discussed the limitations of evaluation and management by telemedicine and the availability of in person appointments. The patient expressed understanding and agreed to proceed.  History of Present Illness:    Observations/Objective:   Assessment and Plan:   Follow Up Instructions:    I discussed the assessment and treatment plan with the patient. The patient was provided an opportunity to ask questions and all were answered. The patient agreed with the plan and demonstrated an understanding of the instructions.   The patient was advised to call back or seek an in-person evaluation if the symptoms worsen or if the condition fails to improve as anticipated.  I provided 15 minutes of non-face-to-face time during this encounter.   Vicente Males, LPN    Review of Systems  Constitutional: Negative for activity change, appetite change and fatigue.  HENT: Negative for congestion and rhinorrhea.   Respiratory: Negative for cough and shortness of breath.   Cardiovascular: Negative for chest pain and leg swelling.  Gastrointestinal: Negative for abdominal pain and diarrhea.  Endocrine: Negative for polydipsia and polyphagia.  Skin: Negative for color change.  Neurological: Positive for headaches. Negative for dizziness and weakness.  Psychiatric/Behavioral: Negative for behavioral problems and confusion.       Objective:   Physical Exam  Today's visit was via telephone Physical exam was not possible for this visit       Assessment & Plan:  The patient was seen today as part of a comprehensive visit for diabetes. The importance of keeping her A1c at or below 7 was discussed.  Importance of regular physical activity was discussed.   The  importance of adherence to medication as well as a controlled low starch/sugar diet was also discussed.  Standard follow-up visit recommended.  Also patient aware failure to keep diabetes under control increases the risk of complications.  HTN- Patient was seen today as part of a visit regarding hypertension. The importance of healthy diet and regular physical activity was discussed. The importance of compliance with medications discussed.  Ideal goal is to keep blood pressure low elevated levels certainly below 540/98 when possible.  The patient was counseled that keeping blood pressure under control lessen his risk of complications.  The importance of regular follow-ups was discussed with the patient.  Low-salt diet such as DASH recommended.  Regular physical activity was recommended as well.  Patient was advised to keep regular follow-ups.  The patient will do lab work later this month and will do a follow-up visit in the first part of July so we can check her blood pressure she states that her blood pressure was elevated recently at ophthalmologist office

## 2019-04-04 DIAGNOSIS — H34832 Tributary (branch) retinal vein occlusion, left eye, with macular edema: Secondary | ICD-10-CM | POA: Diagnosis not present

## 2019-04-04 DIAGNOSIS — H401124 Primary open-angle glaucoma, left eye, indeterminate stage: Secondary | ICD-10-CM | POA: Diagnosis not present

## 2019-04-04 DIAGNOSIS — H401111 Primary open-angle glaucoma, right eye, mild stage: Secondary | ICD-10-CM | POA: Diagnosis not present

## 2019-04-07 ENCOUNTER — Other Ambulatory Visit: Payer: Self-pay | Admitting: Family Medicine

## 2019-04-08 ENCOUNTER — Other Ambulatory Visit: Payer: Self-pay | Admitting: Family Medicine

## 2019-04-09 ENCOUNTER — Telehealth: Payer: Self-pay | Admitting: Family Medicine

## 2019-04-09 MED ORDER — TRIAMTERENE-HCTZ 37.5-25 MG PO TABS: 1.0000 | ORAL_TABLET | Freq: Every day | ORAL | 1 refills | Status: DC

## 2019-04-09 NOTE — Telephone Encounter (Signed)
triamterene-hydrochlorothiazide (MAXZIDE-25) 37.5-25 MG tablet   Patient wants a 3 month supply.  Last seen 03/28/19   [facility] in [place]

## 2019-04-09 NOTE — Telephone Encounter (Signed)
Pt is having some itching and pt states that she did have some pelvic pain before going to the bathroom but none at this time. No fever. Please advise. Thank you

## 2019-04-10 ENCOUNTER — Other Ambulatory Visit: Payer: Self-pay

## 2019-04-10 MED ORDER — FLUCONAZOLE 150 MG PO TABS: ORAL_TABLET | ORAL | 0 refills | Status: DC

## 2019-04-10 NOTE — Telephone Encounter (Signed)
May refill each follow-up if problems

## 2019-04-10 NOTE — Telephone Encounter (Signed)
Pt returned call and verbalized understanding. Pt states she is having a yeast infection. Diflucan sent in per protocol

## 2019-04-10 NOTE — Telephone Encounter (Signed)
Medication sent in yesterday. Left message to return call

## 2019-04-11 ENCOUNTER — Other Ambulatory Visit: Payer: Self-pay

## 2019-04-11 MED ORDER — ONETOUCH ULTRA VI STRP: ORAL_STRIP | 0 refills | Status: DC

## 2019-04-12 ENCOUNTER — Other Ambulatory Visit: Payer: Self-pay | Admitting: *Deleted

## 2019-04-12 MED ORDER — BLOOD GLUCOSE METER KIT: PACK | 0 refills | Status: DC

## 2019-04-18 ENCOUNTER — Other Ambulatory Visit: Payer: Self-pay

## 2019-04-19 ENCOUNTER — Encounter: Payer: Self-pay | Admitting: Family Medicine

## 2019-04-19 ENCOUNTER — Ambulatory Visit (INDEPENDENT_AMBULATORY_CARE_PROVIDER_SITE_OTHER): Payer: Medicare Other | Admitting: Family Medicine

## 2019-04-19 VITALS — BP 136/68 | Temp 97.3°F | Wt 166.0 lb

## 2019-04-19 DIAGNOSIS — J301 Allergic rhinitis due to pollen: Secondary | ICD-10-CM | POA: Diagnosis not present

## 2019-04-19 DIAGNOSIS — I6523 Occlusion and stenosis of bilateral carotid arteries: Secondary | ICD-10-CM

## 2019-04-19 DIAGNOSIS — I10 Essential (primary) hypertension: Secondary | ICD-10-CM | POA: Diagnosis not present

## 2019-04-19 DIAGNOSIS — E1159 Type 2 diabetes mellitus with other circulatory complications: Secondary | ICD-10-CM | POA: Diagnosis not present

## 2019-04-19 DIAGNOSIS — E7849 Other hyperlipidemia: Secondary | ICD-10-CM | POA: Diagnosis not present

## 2019-04-19 LAB — POCT GLYCOSYLATED HEMOGLOBIN (HGB A1C): Hemoglobin A1C: 6.3 % — AB (ref 4.0–5.6)

## 2019-04-19 MED ORDER — SHINGRIX 50 MCG/0.5ML IM SUSR: 0.5000 mL | Freq: Once | INTRAMUSCULAR | 1 refills | Status: AC

## 2019-04-19 MED ORDER — ATORVASTATIN CALCIUM 80 MG PO TABS: 80.0000 mg | ORAL_TABLET | Freq: Every day | ORAL | 1 refills | Status: DC

## 2019-04-19 MED ORDER — AMLODIPINE BESYLATE 10 MG PO TABS: ORAL_TABLET | ORAL | 1 refills | Status: DC

## 2019-04-19 NOTE — Patient Instructions (Signed)

## 2019-04-19 NOTE — Progress Notes (Signed)
Subjective:    Patient ID: Kara Kirby, female    DOB: [birth year], 73 y.o.   MRN: [id number]  Hypertension This is a chronic problem. Pertinent negatives include no chest pain or shortness of breath. Risk factors for coronary artery disease include diabetes mellitus. There are no compliance problems.   Diabetes She presents for her follow-up diabetic visit. She has type 2 diabetes mellitus. There are no hypoglycemic associated symptoms. Pertinent negatives for hypoglycemia include no confusion or dizziness. There are no diabetic associated symptoms. Pertinent negatives for diabetes include no chest pain, no fatigue, no polydipsia, no polyphagia and no weakness. There are no hypoglycemic complications. There are no diabetic complications. Eye exam is current.   Pt states she is having some sinus issues. Relates a lot of allergy issues head congestion drainage a little upper chest congestion denies high fever chills sweats    Pt states she is checking sugar about 3-4 times a week.  Patient here for follow-up regarding cholesterol.  The patient does have hyperlipidemia.  Patient does try to maintain a reasonable diet.  Patient does take the medication on a regular basis.  Denies missing a dose.  The patient denies any obvious side effects.  Prior blood work results reviewed with the patient.  The patient is aware of his cholesterol goals and the need to keep it under good control to lessen the risk of disease.  Patient for blood pressure check up.  The patient does have hypertension.  The patient is on medication.  Patient relates compliance with meds. Todays BP reviewed with the patient. Patient denies issues with medication. Patient relates reasonable diet. Patient tries to minimize salt. Patient aware of BP goals.    Review of Systems  Constitutional: Negative for activity change, appetite change and fatigue.  HENT: Negative for congestion and rhinorrhea.   Respiratory: Negative for  cough and shortness of breath.   Cardiovascular: Negative for chest pain and leg swelling.  Gastrointestinal: Negative for abdominal pain and diarrhea.  Endocrine: Negative for polydipsia and polyphagia.  Skin: Negative for color change.  Neurological: Negative for dizziness and weakness.  Psychiatric/Behavioral: Negative for behavioral problems and confusion.       Objective:   Physical Exam Vitals signs reviewed.  Constitutional:      General: She is not in acute distress. HENT:     Head: Normocephalic and atraumatic.  Eyes:     General:        Right eye: No discharge.        Left eye: No discharge.  Neck:     Trachea: No tracheal deviation.  Cardiovascular:     Rate and Rhythm: Normal rate and regular rhythm.     Heart sounds: Normal heart sounds. No murmur.  Pulmonary:     Effort: Pulmonary effort is normal. No respiratory distress.     Breath sounds: Normal breath sounds.  Lymphadenopathy:     Cervical: No cervical adenopathy.  Skin:    General: Skin is warm and dry.  Neurological:     Mental Status: She is alert.     Coordination: Coordination normal.  Psychiatric:        Behavior: Behavior normal.           Assessment & Plan:  HTN- Patient was seen today as part of a visit regarding hypertension. The importance of healthy diet and regular physical activity was discussed. The importance of compliance with medications discussed.  Ideal goal is to keep blood pressure low  elevated levels certainly below 371/06 when possible.  The patient was counseled that keeping blood pressure under control lessen his risk of complications.  The importance of regular follow-ups was discussed with the patient.  Low-salt diet such as DASH recommended.  Regular physical activity was recommended as well.  Patient was advised to keep regular follow-ups.  The patient was seen today as part of an evaluation regarding hyperlipidemia.  Recent lab work has been reviewed with the  patient as well as the goals for good cholesterol care.  In addition to this medications have been discussed the importance of compliance with diet and medications discussed as well.  Finally the patient is aware that poor control of cholesterol, noncompliance can dramatically increase the risk of complications. The patient will keep regular office visits and the patient does agreed to periodic lab work.  The patient was seen today as part of a comprehensive visit for diabetes. The importance of keeping her A1c at or below 7 was discussed.  Importance of regular physical activity was discussed.   The importance of adherence to medication as well as a controlled low starch/sugar diet was also discussed.  Standard follow-up visit recommended.  Also patient aware failure to keep diabetes under control increases the risk of complications.  Head congestion allergy issues use OTC medicine  25 minutes was spent with the patient.  This statement verifies that 25 minutes was indeed spent with the patient.  More than 50% of this visit-total duration of the visit-was spent in counseling and coordination of care. The issues that the patient came in for today as reflected in the diagnosis (s) please refer to documentation for further details.

## 2019-04-20 LAB — BASIC METABOLIC PANEL
BUN/Creatinine Ratio: 17 (ref 12–28)
BUN: 13 mg/dL (ref 8–27)
CO2: 25 mmol/L (ref 20–29)
Calcium: 9.4 mg/dL (ref 8.7–10.3)
Chloride: 102 mmol/L (ref 96–106)
Creatinine, Ser: 0.75 mg/dL (ref 0.57–1.00)
GFR calc Af Amer: 91 mL/min/{1.73_m2} (ref >59)
GFR calc non Af Amer: 79 mL/min/{1.73_m2} (ref >59)
Glucose: 170 mg/dL — ABNORMAL HIGH (ref 65–99)
Potassium: 4.4 mmol/L (ref 3.5–5.2)
Sodium: 143 mmol/L (ref 134–144)

## 2019-04-20 LAB — HEMOGLOBIN A1C
Est. average glucose Bld gHb Est-mCnc: 154 mg/dL
Hgb A1c MFr Bld: 7 % — ABNORMAL HIGH (ref 4.8–5.6)

## 2019-04-20 LAB — LIPID PANEL
Chol/HDL Ratio: 3.3 ratio (ref 0.0–4.4)
Cholesterol, Total: 152 mg/dL (ref 100–199)
HDL: 46 mg/dL (ref >39)
LDL Calculated: 86 mg/dL (ref 0–99)
Triglycerides: 100 mg/dL (ref 0–149)
VLDL Cholesterol Cal: 20 mg/dL (ref 5–40)

## 2019-04-20 LAB — HEPATIC FUNCTION PANEL
ALT: 29 IU/L (ref 0–32)
AST: 23 IU/L (ref 0–40)
Albumin: 4.7 g/dL (ref 3.7–4.7)
Alkaline Phosphatase: 58 IU/L (ref 39–117)
Bilirubin Total: 0.5 mg/dL (ref 0.0–1.2)
Bilirubin, Direct: 0.17 mg/dL (ref 0.00–0.40)
Total Protein: 6.7 g/dL (ref 6.0–8.5)

## 2019-04-21 ENCOUNTER — Encounter: Payer: Self-pay | Admitting: Family Medicine

## 2019-05-31 DIAGNOSIS — H401124 Primary open-angle glaucoma, left eye, indeterminate stage: Secondary | ICD-10-CM | POA: Diagnosis not present

## 2019-05-31 DIAGNOSIS — H25813 Combined forms of age-related cataract, bilateral: Secondary | ICD-10-CM | POA: Diagnosis not present

## 2019-05-31 DIAGNOSIS — E113299 Type 2 diabetes mellitus with mild nonproliferative diabetic retinopathy without macular edema, unspecified eye: Secondary | ICD-10-CM | POA: Diagnosis not present

## 2019-05-31 DIAGNOSIS — H34832 Tributary (branch) retinal vein occlusion, left eye, with macular edema: Secondary | ICD-10-CM | POA: Diagnosis not present

## 2019-05-31 DIAGNOSIS — H468 Other optic neuritis: Secondary | ICD-10-CM | POA: Diagnosis not present

## 2019-05-31 DIAGNOSIS — H401111 Primary open-angle glaucoma, right eye, mild stage: Secondary | ICD-10-CM | POA: Diagnosis not present

## 2019-06-23 ENCOUNTER — Other Ambulatory Visit: Payer: Self-pay | Admitting: Family Medicine

## 2019-06-23 DIAGNOSIS — Z23 Encounter for immunization: Secondary | ICD-10-CM | POA: Diagnosis not present

## 2019-06-25 ENCOUNTER — Other Ambulatory Visit: Payer: Self-pay

## 2019-06-25 MED ORDER — METFORMIN HCL 500 MG PO TABS: 1000.0000 mg | ORAL_TABLET | Freq: Two times a day (BID) | ORAL | 2 refills | Status: DC

## 2019-06-26 ENCOUNTER — Ambulatory Visit: Payer: Medicare Other | Admitting: Nutrition

## 2019-07-09 ENCOUNTER — Other Ambulatory Visit (HOSPITAL_COMMUNITY): Payer: Self-pay | Admitting: Family Medicine

## 2019-07-09 DIAGNOSIS — Z1231 Encounter for screening mammogram for malignant neoplasm of breast: Secondary | ICD-10-CM

## 2019-07-23 ENCOUNTER — Encounter: Payer: Self-pay | Admitting: Nutrition

## 2019-07-23 ENCOUNTER — Encounter: Payer: Medicare Other | Attending: Family Medicine | Admitting: Nutrition

## 2019-07-23 ENCOUNTER — Other Ambulatory Visit: Payer: Self-pay

## 2019-07-23 VITALS — Ht 64.0 in | Wt 168.8 lb

## 2019-07-23 DIAGNOSIS — I10 Essential (primary) hypertension: Secondary | ICD-10-CM | POA: Insufficient documentation

## 2019-07-23 DIAGNOSIS — E782 Mixed hyperlipidemia: Secondary | ICD-10-CM

## 2019-07-23 DIAGNOSIS — E118 Type 2 diabetes mellitus with unspecified complications: Secondary | ICD-10-CM | POA: Insufficient documentation

## 2019-07-23 DIAGNOSIS — E669 Obesity, unspecified: Secondary | ICD-10-CM | POA: Insufficient documentation

## 2019-07-23 DIAGNOSIS — IMO0002 Reserved for concepts with insufficient information to code with codable children: Secondary | ICD-10-CM

## 2019-07-23 DIAGNOSIS — E1165 Type 2 diabetes mellitus with hyperglycemia: Secondary | ICD-10-CM

## 2019-07-23 NOTE — Patient Instructions (Signed)
Continue to build on exercise daily Watch portions. Keep up the great job! Be sure to get plenty of lower carb veggies with lunch and dinner. Get A1C to 6% or less.  Lose 2 lbs in a month.

## 2019-07-23 NOTE — Progress Notes (Signed)
  Medical Nutrition Therapy:  Appt start time: 1330 end time:  1345   Assessment:  Primary concerns today: Diabetes Type 2. 130-150's   Metformin 1000 mg BID.  She notes she is watching portions and avoiding snacks between meals. Suppose to see Dr. Luan Pulling in July 2020.  Lab Results  Component Value Date   HGBA1C 7.0 (H) 04/19/2019   CMP Latest Ref Rng & Units 04/19/2019 09/11/2018 10/18/2017  Glucose 65 - 99 mg/dL 170(H) 164(H) 166(H)  BUN 8 - 27 mg/dL 13 16 14   Creatinine 0.57 - 1.00 mg/dL 0.75 0.94 0.99  Sodium 134 - 144 mmol/L 143 141 143  Potassium 3.5 - 5.2 mmol/L 4.4 4.0 3.7  Chloride 96 - 106 mmol/L 102 103 101  CO2 20 - 29 mmol/L 25 23 26   Calcium 8.7 - 10.3 mg/dL 9.4 9.1 9.8  Total Protein 6.0 - 8.5 g/dL 6.7 6.3 6.9  Total Bilirubin 0.0 - 1.2 mg/dL 0.5 0.4 0.5  Alkaline Phos 39 - 117 IU/L 58 77 75  AST 0 - 40 IU/L 23 20 17   ALT 0 - 32 IU/L 29 27 23      Preferred Learning Style:    No preference indicated   Learning Readiness:     Ready  Change in progress   MEDICATIONS: see list   DIETARY INTAKE:    24-hr recall:  B) Oatmeal with raisin and 1 slice , 4 oz coffee-splenda L) Meatloaf and cabbage, cornbread,  Water, Koolaid, D) same as lunch: water  Beverages: water,   Usual physical activity: walks,  Estimated energy needs: 1500  calories 170 g carbohydrates 112 g protein 42g fat  Progress Towards Goal(s):  In progress.Goals Nutritional Diagnosis:  NB-1.1 Food and nutrition-related knowledge deficit As related to Diabetes Type 2.  As evidenced by A1C .    Intervention:  Nutrition and Diabetes education provided on My Plate, CHO counting, meal planning, portion sizes, timing of meals, avoiding snacks between meals unless having a low blood sugar, target ranges for A1C and blood sugars, signs/symptoms and treatment of hyper/hypoglycemia, monitoring blood sugars, taking medications as prescribed, benefits of exercising 30 minutes per day and  prevention of complications of DM. Marland Kitchen Continue to build on exercise daily Watch portions. Keep up the great job! Be sure to get plenty of lower carb veggies with lunch and dinner. Get A1C to 6% or less.  Lose 2 lbs in a month.    Teaching Method Utilized:   Auditory  Handouts given during visit include:  Verbally went over Plate Method, CHO counting and meal planning   Barriers to learning/adherence to lifestyle change: none  Demonstrated degree of understanding via:  Teach Back   Monitoring/Evaluation:  Dietary intake, exercise, , and body weight in 3 month(s).

## 2019-08-01 ENCOUNTER — Other Ambulatory Visit: Payer: Self-pay

## 2019-08-01 ENCOUNTER — Ambulatory Visit (HOSPITAL_COMMUNITY)
Admission: RE | Admit: 2019-08-01 | Discharge: 2019-08-01 | Disposition: A | Discharge status: Home / Self Care | Payer: Medicare Other | Source: Ambulatory Visit | Attending: Family Medicine | Admitting: Family Medicine

## 2019-08-01 DIAGNOSIS — Z1231 Encounter for screening mammogram for malignant neoplasm of breast: Secondary | ICD-10-CM | POA: Diagnosis not present

## 2019-08-20 ENCOUNTER — Encounter: Payer: Self-pay | Admitting: Family Medicine

## 2019-08-20 ENCOUNTER — Ambulatory Visit (INDEPENDENT_AMBULATORY_CARE_PROVIDER_SITE_OTHER): Payer: Medicare Other | Admitting: Family Medicine

## 2019-08-20 ENCOUNTER — Other Ambulatory Visit: Payer: Self-pay

## 2019-08-20 VITALS — BP 138/74 | Temp 97.0°F | Wt 168.2 lb

## 2019-08-20 DIAGNOSIS — E1159 Type 2 diabetes mellitus with other circulatory complications: Secondary | ICD-10-CM | POA: Diagnosis not present

## 2019-08-20 DIAGNOSIS — R0981 Nasal congestion: Secondary | ICD-10-CM | POA: Diagnosis not present

## 2019-08-20 DIAGNOSIS — I10 Essential (primary) hypertension: Secondary | ICD-10-CM | POA: Diagnosis not present

## 2019-08-20 DIAGNOSIS — E782 Mixed hyperlipidemia: Secondary | ICD-10-CM | POA: Diagnosis not present

## 2019-08-20 DIAGNOSIS — I6523 Occlusion and stenosis of bilateral carotid arteries: Secondary | ICD-10-CM | POA: Diagnosis not present

## 2019-08-20 NOTE — Progress Notes (Signed)
Subjective:    Patient ID: Kara Kirby, female    DOB: 1945/12/08, 73 y.o.   MRN: [id number] Very nice patient here today for multiple health issues Diabetes She presents for her follow-up diabetic visit. She has type 2 diabetes mellitus. There are no hypoglycemic associated symptoms. Pertinent negatives for hypoglycemia include no confusion or dizziness. There are no diabetic associated symptoms. Pertinent negatives for diabetes include no chest pain, no fatigue, no polydipsia, no polyphagia and no weakness. There are no hypoglycemic complications. There are no diabetic complications. Risk factors for coronary artery disease include hypertension. She does not see a podiatrist.Eye exam is current.   Patient here for follow-up regarding cholesterol.  The patient does have hyperlipidemia.  Patient does try to maintain a reasonable diet.  Patient does take the medication on a regular basis.  Denies missing a dose.  The patient denies any obvious side effects.  Prior blood work results reviewed with the patient.  The patient is aware of his cholesterol goals and the need to keep it under good control to lessen the risk of disease.  She does try to watch her diet she is taking her medicine  Patient for blood pressure check up.  The patient does have hypertension.  The patient is on medication.  Patient relates compliance with meds. Todays BP reviewed with the patient. Patient denies issues with medication. Patient relates reasonable diet. Patient tries to minimize salt. Patient aware of BP goals. She checks her blood pressure periodically states is been doing well  Pt states she has had a sinus headache and sometimes a slight cough.  She does have chronic sinus pressure but no active sign of infection currently  Patient denies being depressed but she is somewhat stressed by her sister who is somewhat helpful to her on the phone  Review of Systems  Constitutional: Negative for activity change,  appetite change and fatigue.  HENT: Negative for congestion and rhinorrhea.   Respiratory: Negative for cough and shortness of breath.   Cardiovascular: Negative for chest pain and leg swelling.  Gastrointestinal: Negative for abdominal pain and diarrhea.  Endocrine: Negative for polydipsia and polyphagia.  Skin: Negative for color change.  Neurological: Negative for dizziness and weakness.  Psychiatric/Behavioral: Negative for behavioral problems and confusion.       Objective:   Physical Exam Vitals signs reviewed.  Constitutional:      General: She is not in acute distress. HENT:     Head: Normocephalic and atraumatic.  Eyes:     General:        Right eye: No discharge.        Left eye: No discharge.  Neck:     Trachea: No tracheal deviation.  Cardiovascular:     Rate and Rhythm: Normal rate and regular rhythm.     Heart sounds: Normal heart sounds. No murmur.  Pulmonary:     Effort: Pulmonary effort is normal. No respiratory distress.     Breath sounds: Normal breath sounds.  Lymphadenopathy:     Cervical: No cervical adenopathy.  Skin:    General: Skin is warm and dry.  Neurological:     Mental Status: She is alert.     Coordination: Coordination normal.  Psychiatric:        Behavior: Behavior normal.           Assessment & Plan:  HTN- Patient was seen today as part of a visit regarding hypertension. The importance of healthy diet and regular physical  activity was discussed. The importance of compliance with medications discussed.  Ideal goal is to keep blood pressure low elevated levels certainly below [id number] when possible.  The patient was counseled that keeping blood pressure under control lessen his risk of complications.  The importance of regular follow-ups was discussed with the patient.  Low-salt diet such as DASH recommended.  Regular physical activity was recommended as well.  Patient was advised to keep regular follow-ups. Blood pressure good  today continue current measures  History of stroke she sees vascular doctor on a yearly basis  Diabetes actually doing well continue current measures.  Follow-up by mid spring  Hyperlipidemia doing well continue current measures watch diet closely  25 minutes was spent with the patient.  This statement verifies that 25 minutes was indeed spent with the patient.  More than 50% of this visit-total duration of the visit-was spent in counseling and coordination of care. The issues that the patient came in for today as reflected in the diagnosis (s) please refer to documentation for further details.  Sinus pressure congestion recommend saline drops and sprays

## 2019-09-04 DIAGNOSIS — H401123 Primary open-angle glaucoma, left eye, severe stage: Secondary | ICD-10-CM | POA: Diagnosis not present

## 2019-09-07 DIAGNOSIS — H401123 Primary open-angle glaucoma, left eye, severe stage: Secondary | ICD-10-CM | POA: Insufficient documentation

## 2019-09-10 DIAGNOSIS — H401123 Primary open-angle glaucoma, left eye, severe stage: Secondary | ICD-10-CM | POA: Diagnosis not present

## 2019-09-10 DIAGNOSIS — H401111 Primary open-angle glaucoma, right eye, mild stage: Secondary | ICD-10-CM | POA: Diagnosis not present

## 2019-09-19 DIAGNOSIS — E1159 Type 2 diabetes mellitus with other circulatory complications: Secondary | ICD-10-CM | POA: Diagnosis not present

## 2019-09-19 DIAGNOSIS — E782 Mixed hyperlipidemia: Secondary | ICD-10-CM | POA: Diagnosis not present

## 2019-09-19 DIAGNOSIS — I10 Essential (primary) hypertension: Secondary | ICD-10-CM | POA: Diagnosis not present

## 2019-09-20 LAB — HEMOGLOBIN A1C
Est. average glucose Bld gHb Est-mCnc: 151 mg/dL
Hgb A1c MFr Bld: 6.9 % — ABNORMAL HIGH (ref 4.8–5.6)

## 2019-09-20 LAB — BASIC METABOLIC PANEL
BUN/Creatinine Ratio: 18 (ref 12–28)
BUN: 16 mg/dL (ref 8–27)
CO2: 26 mmol/L (ref 20–29)
Calcium: 9.4 mg/dL (ref 8.7–10.3)
Chloride: 101 mmol/L (ref 96–106)
Creatinine, Ser: 0.88 mg/dL (ref 0.57–1.00)
GFR calc Af Amer: 75 mL/min/{1.73_m2} (ref >59)
GFR calc non Af Amer: 65 mL/min/{1.73_m2} (ref >59)
Glucose: 157 mg/dL — ABNORMAL HIGH (ref 65–99)
Potassium: 3.5 mmol/L (ref 3.5–5.2)
Sodium: 143 mmol/L (ref 134–144)

## 2019-09-20 LAB — LIPID PANEL
Chol/HDL Ratio: 3.1 ratio (ref 0.0–4.4)
Cholesterol, Total: 138 mg/dL (ref 100–199)
HDL: 44 mg/dL (ref >39)
LDL Chol Calc (NIH): 76 mg/dL (ref 0–99)
Triglycerides: 94 mg/dL (ref 0–149)
VLDL Cholesterol Cal: 18 mg/dL (ref 5–40)

## 2019-09-21 ENCOUNTER — Encounter: Payer: Self-pay | Admitting: Family Medicine

## 2019-10-01 ENCOUNTER — Other Ambulatory Visit: Payer: Self-pay

## 2019-10-01 MED ORDER — AMLODIPINE BESYLATE 10 MG PO TABS: ORAL_TABLET | ORAL | 1 refills | Status: DC

## 2019-10-01 MED ORDER — DOXAZOSIN MESYLATE 4 MG PO TABS: 4.0000 mg | ORAL_TABLET | Freq: Every day | ORAL | 1 refills | Status: DC

## 2019-10-01 MED ORDER — ATORVASTATIN CALCIUM 80 MG PO TABS: 80.0000 mg | ORAL_TABLET | Freq: Every day | ORAL | 1 refills | Status: DC

## 2019-10-01 MED ORDER — METFORMIN HCL 500 MG PO TABS: 1000.0000 mg | ORAL_TABLET | Freq: Two times a day (BID) | ORAL | 2 refills | Status: DC

## 2019-10-01 MED ORDER — TRIAMTERENE-HCTZ 37.5-25 MG PO TABS: 1.0000 | ORAL_TABLET | Freq: Every day | ORAL | 1 refills | Status: DC

## 2019-10-01 MED ORDER — CLOPIDOGREL BISULFATE 75 MG PO TABS: 75.0000 mg | ORAL_TABLET | Freq: Every day | ORAL | 1 refills | Status: DC

## 2019-10-08 ENCOUNTER — Telehealth: Payer: Self-pay | Admitting: Family Medicine

## 2019-10-08 NOTE — Telephone Encounter (Signed)
Last med check 08/20/19. Eye drops on med list as historical provider.

## 2019-10-08 NOTE — Telephone Encounter (Signed)
Respectfully speaking It appears that her eyedrops are from her specialist.  Ideally that would be the one who would reorder this.  If patient states that there is no way she can get it from her specialists and would like for [place] to review at this time that would be fine  Also see if she would like to have her chronic medications sent in to [facility] and put on file Or if she chooses not to do that then it please change her pharmacy of choice to [facility]

## 2019-10-08 NOTE — Telephone Encounter (Signed)
Patient would like a refill of her eye drops:  latanoprost (XALATAN) 0.005 % ophthalmic solution   [facility] in [place]   *Patient also wanted [place] to note in her chart that starting in January she will be using [facility] mail order pharmacy for her refills.

## 2019-10-08 NOTE — Telephone Encounter (Signed)
Pt contacted and verbalized understanding. Pt states that she does not need any medications sent to [facility] at this time.

## 2019-10-19 ENCOUNTER — Other Ambulatory Visit: Payer: Self-pay | Admitting: Family Medicine

## 2019-10-24 ENCOUNTER — Other Ambulatory Visit: Payer: Self-pay

## 2019-10-24 ENCOUNTER — Encounter: Payer: Self-pay | Admitting: Nutrition

## 2019-10-24 ENCOUNTER — Encounter: Payer: Medicare HMO | Attending: Family Medicine | Admitting: Nutrition

## 2019-10-24 VITALS — Ht 64.0 in | Wt 168.0 lb

## 2019-10-24 DIAGNOSIS — I10 Essential (primary) hypertension: Secondary | ICD-10-CM | POA: Diagnosis not present

## 2019-10-24 DIAGNOSIS — E1165 Type 2 diabetes mellitus with hyperglycemia: Secondary | ICD-10-CM | POA: Diagnosis not present

## 2019-10-24 DIAGNOSIS — E782 Mixed hyperlipidemia: Secondary | ICD-10-CM | POA: Diagnosis not present

## 2019-10-24 DIAGNOSIS — E669 Obesity, unspecified: Secondary | ICD-10-CM | POA: Diagnosis not present

## 2019-10-24 DIAGNOSIS — E118 Type 2 diabetes mellitus with unspecified complications: Secondary | ICD-10-CM | POA: Diagnosis not present

## 2019-10-24 DIAGNOSIS — IMO0002 Reserved for concepts with insufficient information to code with codable children: Secondary | ICD-10-CM

## 2019-10-24 NOTE — Progress Notes (Signed)
  Medical Nutrition Therapy:  Appt start time: 1300 end time:  1315  Assessment:  Primary concerns today: Diabetes Type 2. PCP: Dr. Wolfgang Phoenix Changes Made  Using an air fryer now.  FBS 140 this,  Metformin 1000 mg BID.  She notes she is watching portions and avoiding snacks between meals.   Lab Results  Component Value Date   HGBA1C 6.9 (H) 09/19/2019   CMP Latest Ref Rng & Units 09/19/2019 04/19/2019 09/11/2018  Glucose 65 - 99 mg/dL 157(H) 170(H) 164(H)  BUN 8 - 27 mg/dL 16 13 16   Creatinine 0.57 - 1.00 mg/dL 0.88 0.75 0.94  Sodium 134 - 144 mmol/L 143 143 141  Potassium 3.5 - 5.2 mmol/L 3.5 4.4 4.0  Chloride 96 - 106 mmol/L 101 102 103  CO2 20 - 29 mmol/L 26 25 23   Calcium 8.7 - 10.3 mg/dL 9.4 9.4 9.1  Total Protein 6.0 - 8.5 g/dL - 6.7 6.3  Total Bilirubin 0.0 - 1.2 mg/dL - 0.5 0.4  Alkaline Phos 39 - 117 IU/L - 58 77  AST 0 - 40 IU/L - 23 20  ALT 0 - 32 IU/L - 29 27     Preferred Learning Style:    No preference indicated   Learning Readiness:     Ready  Change in progress   MEDICATIONS: see list   DIETARY INTAKE:   24-hr recall:  B) basic 4 cereal, mandrin orange,water  L)yogurt, water  D) Meat and vegetables. Water Beverages: water,   Usual physical activity: walks,  Estimated energy needs: 1500  calories 170 g carbohydrates 112 g protein 42g fat  Progress Towards Goal(s):  In progress.Goals Nutritional Diagnosis:  NB-1.1 Food and nutrition-related knowledge deficit As related to Diabetes Type 2.  As evidenced by A1C .    Intervention:  Nutrition and Diabetes education provided on My Plate, CHO counting, meal planning, portion sizes, timing of meals, avoiding snacks between meals unless having a low blood sugar, target ranges for A1C and blood sugars, signs/symptoms and treatment of hyper/hypoglycemia, monitoring blood sugars, taking medications as prescribed, benefits of exercising 30 minutes per day and prevention of complications of  DM. Marland Kitchen Continue to build on exercise daily Watch portions. Keep up the great job! Be sure to get plenty of lower carb veggies with lunch and dinner. Get A1C to 6.5% or less.  Lose 2 lbs in a month.    Teaching Method Utilized:   Auditory  Handouts given during visit include:  Verbally went over Plate Method, CHO counting and meal planning   Barriers to learning/adherence to lifestyle change: none  Demonstrated degree of understanding via:  Teach Back   Monitoring/Evaluation:  Dietary intake, exercise, , and body weight in 3 month(s).   COVID-19 Vaccine Information can be found at: [email or web address].uk For questions related to vaccine distribution or appointments, please email vaccine@[email or web address] .[email or web address] or call [phone].

## 2019-10-24 NOTE — Patient Instructions (Signed)
.   Continue to build on exercise daily Watch portions. Keep up the great job! Be sure to get plenty of lower carb veggies with lunch and dinner. Get A1C to 6.5% or less.  Lose 2 lbs in a month.

## 2019-12-11 ENCOUNTER — Other Ambulatory Visit: Payer: Self-pay | Admitting: *Deleted

## 2019-12-11 DIAGNOSIS — I6523 Occlusion and stenosis of bilateral carotid arteries: Secondary | ICD-10-CM

## 2019-12-12 ENCOUNTER — Other Ambulatory Visit: Payer: Self-pay

## 2019-12-12 ENCOUNTER — Ambulatory Visit (INDEPENDENT_AMBULATORY_CARE_PROVIDER_SITE_OTHER): Payer: Medicare HMO | Admitting: Physician Assistant

## 2019-12-12 ENCOUNTER — Ambulatory Visit (HOSPITAL_COMMUNITY)
Admission: RE | Admit: 2019-12-12 | Discharge: 2019-12-12 | Disposition: A | Discharge status: Home / Self Care | Payer: Medicare HMO | Source: Ambulatory Visit | Attending: Vascular Surgery | Admitting: Vascular Surgery

## 2019-12-12 VITALS — BP 164/91 | HR 67 | Temp 97.0°F | Ht 64.0 in | Wt 169.0 lb

## 2019-12-12 DIAGNOSIS — I6523 Occlusion and stenosis of bilateral carotid arteries: Secondary | ICD-10-CM | POA: Insufficient documentation

## 2019-12-12 NOTE — Progress Notes (Signed)
Office Note     CC:  follow up Requesting Provider:  Luking, Scott A, MD  HPI: Kara Kirby is a 73 y.o. (10/29/1945) female who presents right CEA by Dr. Dickson in 2000.  She subsequently underwent right carotid stent placement in January 2017 by Dr. Fields.   Prior to her CEA, she had a right brain stroke with left leg weakness that resolved. Has any symptoms of stroke including monocular blindness, slurred speech, extremity weakness, confusion.  She denies lower extremity pain with exertion or rest pain  The pt is on a statin for cholesterol management.  The pt is on a daily aspirin.   Other AC:  Plavix The pt is on Cardura, Norvasc for hypertension.   The pt is diabetic.  Metformin Tobacco hx: Never  Past Medical History:  Diagnosis Date  . Allergy   . Arthritis    Left shoulder  . Carotid artery occlusion   . CVA (cerebral vascular accident) (HCC) 02-19-13  . DM (diabetes mellitus) (HCC)   . Dyslipidemia   . GERD (gastroesophageal reflux disease)    intermittent Sx  . Hematuria   . Hip arthritis 2003   Left  . HTN (hypertension)   . Hyperlipidemia   . Renal insufficiency, mild   . Sleep related leg cramps     Past Surgical History:  Procedure Laterality Date  . CAROTID ENDARTERECTOMY  RIGHT  . COLONOSCOPY    . COLONOSCOPY  10/19/2011   Procedure: COLONOSCOPY;  Surgeon: Sandi M Fields, MD;  Location: [facility];  Service: Endoscopy;  Laterality: N/A;  10:15  . COLONOSCOPY N/A 08/16/2018   Procedure: COLONOSCOPY;  Surgeon: Fields, Sandi L, MD;  Location: [facility];  Service: Endoscopy;  Laterality: N/A;  9:30am  . PERIPHERAL VASCULAR CATHETERIZATION Right 10/17/2015   Procedure: Carotid PTA/Stent Intervention;  Surgeon: Charles E Fields, MD;  Location: [facility];  Service: Cardiovascular;  Laterality: Right;  . PERIPHERAL VASCULAR CATHETERIZATION Right 10/17/2015   Procedure: Carotid Angiography;  Surgeon: Charles E Fields, MD;  Location: [facility];  Service: Cardiovascular;  Laterality: Right;  . S/P Hysterectomy    . TOTAL ABDOMINAL HYSTERECTOMY     FIBROID, HYPERMENORRHEA    Social History   Socioeconomic History  . Marital status: Divorced    Spouse name: Not on file  . Number of children: Not on file  . Years of education: Not on file  . Highest education level: Not on file  Occupational History  . Not on file  Tobacco Use  . Smoking status: Never Smoker  . Smokeless tobacco: Never Used  Substance and Sexual Activity  . Alcohol use: No    Alcohol/week: 0.0 standard drinks  . Drug use: No  . Sexual activity: Not on file  Other Topics Concern  . 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, BELONGS TO THE [employer] [place])   Social Determinants of Health   Financial Resource Strain:   . Difficulty of Paying Living Expenses: Not on file  Food Insecurity:   . Worried About Running Out of Food in the Last Year: Not on file  . Ran Out of Food in the Last Year: Not on file  Transportation Needs:   . Lack of Transportation (Medical): Not on file  . Lack of Transportation (Non-Medical): Not on file  Physical Activity:   . Days of Exercise per Week: Not on file  . Minutes of   Exercise per Session: Not on file  Stress:   . Feeling of Stress : Not on file  Social Connections:   . Frequency of Communication with Friends and Family: Not on file  . Frequency of Social Gatherings with Friends and Family: Not on file  . Attends Religious Services: Not on file  . Active Member of Clubs or Organizations: Not on file  . Attends Archivist Meetings: Not on file  . Marital Status: Not on file  Intimate Partner Violence:   . Fear of Current or Ex-Partner: Not on file  . Emotionally Abused: Not on file  . Physically Abused: Not on file  . Sexually Abused: Not on file    Family History  Problem Relation Age of Onset  . Hypertension Mother   . Heart attack Mother   . Heart  disease Mother   . Hypertension Sister   . Diabetes Sister        Amputation  . Hyperlipidemia Sister   . Heart attack Sister   . Diabetes Sister   . Hypertension Sister   . Heart disease Sister        Before age 65  . Heart disease Father   . Colon cancer Neg Hx   . Colon polyps Neg Hx     Current Outpatient Medications  Medication Sig Dispense Refill  . acetaminophen (TYLENOL) 500 MG tablet Take 500-1,000 mg by mouth 2 (two) times daily as needed for mild pain.    Marland Kitchen amLODipine (NORVASC) 10 MG tablet 1 qd daily 90 tablet 1  . aspirin 81 MG tablet Take 81 mg by mouth daily.    Marland Kitchen atorvastatin (LIPITOR) 80 MG tablet Take 1 tablet (80 mg total) by mouth daily at 6 PM. 90 tablet 1  . blood glucose meter kit and supplies Dispense based on patient and insurance preference. Use to test glucose once daily (FOR ICD-10 E11.9). 1 each 0  . clopidogrel (PLAVIX) 75 MG tablet Take 1 tablet (75 mg total) by mouth daily. 90 tablet 1  . doxazosin (CARDURA) 4 MG tablet Take 1 tablet (4 mg total) by mouth at bedtime. 90 tablet 1  . famotidine (PEPCID) 20 MG tablet Take 1 tablet (20 mg total) by mouth 2 (two) times daily. 60 tablet 0  . fluconazole (DIFLUCAN) 150 MG tablet TAKE ONE TABLET BY MOUTH AS A ONE-TIME DOSE 1 tablet 0  . fluticasone (FLONASE) 50 MCG/ACT nasal spray USE TWO SPRAY(S) IN EACH NOSTRIL ONCE DAILY 16 g 0  . glucose blood (ONETOUCH ULTRA) test strip USE 1 STRIP TO CHECK GLUCOSE ONCE DAILY 50 each 0  . latanoprost (XALATAN) 0.005 % ophthalmic solution Place 1 drop into both eyes at bedtime.    . metFORMIN (GLUCOPHAGE) 500 MG tablet Take 2 tablets (1,000 mg total) by mouth 2 (two) times daily with a meal. 360 tablet 2  . Multiple Vitamin (MULTIVITAMIN) tablet Take 1 tablet by mouth daily.    . nitroGLYCERIN (NITROSTAT) 0.4 MG SL tablet DISSOLVE ONE TABLET UNDER THE TONGUE EVERY 5 MINUTES AS NEEDED FOR CHEST PAIN.  DO NOT EXCEED A TOTAL OF 3 DOSES IN 15 MINUTES (Patient taking differently:  Place 0.4 mg under the tongue every 5 (five) minutes as needed for chest pain. DISSOLVE ONE TABLET UNDER THE TONGUE EVERY 5 MINUTES AS NEEDED FOR CHEST PAIN.  DO NOT EXCEED A TOTAL OF 3 DOSES IN 15 MINUTES) 25 tablet 0  . timolol (TIMOPTIC) 0.5 % ophthalmic solution Place 1 drop into  both eyes daily.    Marland Kitchen triamterene-hydrochlorothiazide (MAXZIDE-25) 37.5-25 MG tablet Take 1 tablet by mouth daily. 90 tablet 1  . VENTOLIN HFA 108 (90 Base) MCG/ACT inhaler INHALE 2 PUFFS BY MOUTH EVERY 4 HOURS AS NEEDED 36 g 1   No current facility-administered medications for this visit.    Allergies  Allergen Reactions  . Other Hives    IV DYE   . Iodinated Diagnostic Agents Hives    Pre-medicate with benadryl and prednisone  . Lisinopril     Angioedema of tongue July 2017     REVIEW OF SYSTEMS:   [X] denotes positive finding, [ ] denotes negative finding Cardiac  Comments:  Chest pain or chest pressure:    Shortness of breath upon exertion:    Short of breath when lying flat:    Irregular heart rhythm:        Vascular    Pain in calf, thigh, or hip brought on by ambulation:    Pain in feet at night that wakes you up from your sleep:     Blood clot in your veins:    Leg swelling:         Pulmonary    Oxygen at home:    Productive cough:     Wheezing:         Neurologic    Sudden weakness in arms or legs:     Sudden numbness in arms or legs:     Sudden onset of difficulty speaking or slurred speech:    Temporary loss of vision in one eye:     Problems with dizziness:         Gastrointestinal    Blood in stool:     Vomited blood:         Genitourinary    Burning when urinating:     Blood in urine:        Psychiatric    Major depression:         Hematologic    Bleeding problems:    Problems with blood clotting too easily:        Skin    Rashes or ulcers:        Constitutional    Fever or chills:      PHYSICAL EXAMINATION: Vitals:   12/12/19 1157  Weight: 169 lb  (76.7 kg)  Height: 5' 4" (1.626 m)    General:  WDWN in NAD; vital signs documented above Gait: Steady HENT: WNL, normocephalic Pulmonary: normal non-labored breathing , without Rales, rhonchi,  wheezing Cardiac: regular HR, without  Murmurs without carotid bruit Abdomen: soft, NT, no masses Skin: without rashes Vascular Exam/Pulses: Extremities: without ischemic changes, without Gangrene , without cellulitis; without open wounds;  Musculoskeletal: no muscle wasting or atrophy.  No Edema  Neurologic: A&O X 3;  No focal weakness or paresthesias are detected.  Tongue midline Psychiatric:  The pt has Normal affect.  Pulse exam: The patient has palpable 2+ brachial, ulnar, femoral pulses  Non-Invasive Vascular Imaging:   12/12/2019 Right Carotid: Patent stent. ECA not visualized.   Left Carotid: Velocities in the left ICA are consistent with a 40-59%  stenosis.    ASSESSMENT/PLAN:: 74 y.o. female here for follow up for bilateral carotid artery stenosis, status post right CEA in 2000 and subsequent right ICA stent placement in 2017.  Review of her ultrasound study today reveals patent stent and left stenosis similar to 1 year ago.  She remains asymptomatic.  We reviewed the signs and  symptoms of stroke and to proceed to her nearest emergency room for onset of these.  Continue statin, aspirin and Plavix.  Return in 1 year for repeat carotid duplex ultrasound.    Barbie Banner, PA-C [facility] [phone]  Clinic MD:   Oneida Alar

## 2019-12-14 ENCOUNTER — Other Ambulatory Visit: Payer: Self-pay | Admitting: *Deleted

## 2019-12-14 DIAGNOSIS — I6523 Occlusion and stenosis of bilateral carotid arteries: Secondary | ICD-10-CM

## 2019-12-27 DIAGNOSIS — H401111 Primary open-angle glaucoma, right eye, mild stage: Secondary | ICD-10-CM | POA: Diagnosis not present

## 2019-12-27 DIAGNOSIS — E113299 Type 2 diabetes mellitus with mild nonproliferative diabetic retinopathy without macular edema, unspecified eye: Secondary | ICD-10-CM | POA: Diagnosis not present

## 2019-12-27 DIAGNOSIS — H34832 Tributary (branch) retinal vein occlusion, left eye, with macular edema: Secondary | ICD-10-CM | POA: Diagnosis not present

## 2019-12-27 DIAGNOSIS — H401123 Primary open-angle glaucoma, left eye, severe stage: Secondary | ICD-10-CM | POA: Diagnosis not present

## 2019-12-27 DIAGNOSIS — H25813 Combined forms of age-related cataract, bilateral: Secondary | ICD-10-CM | POA: Diagnosis not present

## 2020-01-07 DIAGNOSIS — H401123 Primary open-angle glaucoma, left eye, severe stage: Secondary | ICD-10-CM | POA: Diagnosis not present

## 2020-01-07 DIAGNOSIS — H401111 Primary open-angle glaucoma, right eye, mild stage: Secondary | ICD-10-CM | POA: Diagnosis not present

## 2020-01-14 ENCOUNTER — Ambulatory Visit (INDEPENDENT_AMBULATORY_CARE_PROVIDER_SITE_OTHER): Payer: Medicare HMO | Admitting: Family Medicine

## 2020-01-14 ENCOUNTER — Other Ambulatory Visit: Payer: Self-pay

## 2020-01-14 ENCOUNTER — Encounter: Payer: Self-pay | Admitting: Family Medicine

## 2020-01-14 VITALS — BP 138/68 | Temp 97.2°F | Wt 166.8 lb

## 2020-01-14 DIAGNOSIS — L659 Nonscarring hair loss, unspecified: Secondary | ICD-10-CM

## 2020-01-14 DIAGNOSIS — E782 Mixed hyperlipidemia: Secondary | ICD-10-CM | POA: Diagnosis not present

## 2020-01-14 DIAGNOSIS — E1159 Type 2 diabetes mellitus with other circulatory complications: Secondary | ICD-10-CM | POA: Diagnosis not present

## 2020-01-14 DIAGNOSIS — D509 Iron deficiency anemia, unspecified: Secondary | ICD-10-CM | POA: Diagnosis not present

## 2020-01-14 DIAGNOSIS — I10 Essential (primary) hypertension: Secondary | ICD-10-CM | POA: Diagnosis not present

## 2020-01-14 DIAGNOSIS — J301 Allergic rhinitis due to pollen: Secondary | ICD-10-CM

## 2020-01-14 NOTE — Progress Notes (Signed)
Subjective:    Patient ID: Kara Kirby, female    DOB: October 10, 1946, 74 y.o.   MRN: [id number]  Hypertension This is a chronic problem. Pertinent negatives include no chest pain or shortness of breath. Risk factors for coronary artery disease include diabetes mellitus. There are no compliance problems.   Diabetes She presents for her follow-up diabetic visit. She has type 2 diabetes mellitus. Pertinent negatives for hypoglycemia include no confusion or dizziness. Pertinent negatives for diabetes include no chest pain, no fatigue, no polydipsia, no polyphagia and no weakness. Exercise: pt has not been exercising regular; since warmer weather is coming pt is planning to walk more. (Pt checks sugar occasionally  Sugars have been running a little high(130-166)) Eye exam is current.   Pt would like information on Diverticulitis.  Pt had a colonscopy and GI had spoke with her about diverticulitis.  This patient had a colonoscopy 2019 showed diverticula we did discuss the difference between diverticula, diverticulosis, diverticulitis.  Patient not having any pain currently  Iron; hair loss. How many mg of Biotin should patient take.  She states she read how lack of iron can cause hair loss she is noticed that her hair is getting a little thin on top and in the corners this could be related to age but she would like to have her blood checked  Pt has bene having to use her inhaler more often also.  The patient was talked to regarding this during her allergy flareups she does utilize her inhalers more often  Fall Risk  01/14/2020 10/24/2019 08/20/2019 07/23/2019 05/22/2018  Falls in the past year? 0 0 0 0 No  Number falls in past yr: 0 0 - 0 -  Injury with Fall? 0 0 - 0 -  Follow up Falls evaluation completed - Falls evaluation completed - -    Pt would like to know if she needs to go to lab to have blood drawn.   Review of Systems  Constitutional: Negative for activity change, appetite change and  fatigue.  HENT: Negative for congestion and rhinorrhea.   Respiratory: Negative for cough and shortness of breath.   Cardiovascular: Negative for chest pain and leg swelling.  Gastrointestinal: Negative for abdominal pain and diarrhea.  Endocrine: Negative for polydipsia and polyphagia.  Skin: Negative for color change.  Neurological: Negative for dizziness and weakness.  Psychiatric/Behavioral: Negative for behavioral problems and confusion.       Objective:   Physical Exam Vitals reviewed.  Constitutional:      General: She is not in acute distress. HENT:     Head: Normocephalic and atraumatic.  Eyes:     General:        Right eye: No discharge.        Left eye: No discharge.  Neck:     Trachea: No tracheal deviation.  Cardiovascular:     Rate and Rhythm: Normal rate and regular rhythm.     Heart sounds: Normal heart sounds. No murmur.  Pulmonary:     Effort: Pulmonary effort is normal. No respiratory distress.     Breath sounds: Normal breath sounds.  Lymphadenopathy:     Cervical: No cervical adenopathy.  Skin:    General: Skin is warm and dry.  Neurological:     Mental Status: She is alert.     Coordination: Coordination normal.  Psychiatric:        Behavior: Behavior normal.    Diabetic foot exam normal  Assessment & Plan:  1. Essential hypertension Blood pressure in the bottom number very good top number slightly higher than what I would like but still acceptable at 138/68 - Hemoglobin A1c - TSH - Basic Metabolic Panel (BMET) - Urine Microalbumin w/creat. ratio - CBC with Differential - Ferritin  2. Mixed hyperlipidemia Very important for the patient to continue her medications watch diet check lab work do not need to check \\lipid  profile currently but will check this by later in the summer - Hemoglobin A1c - TSH - Basic Metabolic Panel (BMET) - Urine Microalbumin w/creat. ratio - CBC with Differential - Ferritin  3. Type 2 diabetes  mellitus with other circulatory complication, without long-term current use of insulin (HCC) Because her morning sugars have been more elevated than usual in the 130-160 range we will check A1c may need to make adjustments - Hemoglobin A1c - TSH - Basic Metabolic Panel (BMET) - Urine Microalbumin w/creat. ratio - CBC with Differential - Ferritin  4. Alopecia The loss of hair I believe is more age-related hormonal I do not feel that this is a sign of iron deficiency but we will check lab work - Hemoglobin A1c - TSH - Basic Metabolic Panel (BMET) - Urine Microalbumin w/creat. ratio - CBC with Differential - Ferritin  5. Allergic rhinitis due to pollen, unspecified seasonality Significant allergies using the albuterol on a regular basis with no sign of asthma flareup - Hemoglobin A1c - TSH - Basic Metabolic Panel (BMET) - Urine Microalbumin w/creat. ratio - CBC with Differential - Ferritin  6. Iron deficiency anemia, unspecified iron deficiency anemia type Check labs patient up-to-date on colonoscopy - Hemoglobin A1c - TSH - Basic Metabolic Panel (BMET) - Urine Microalbumin w/creat. ratio - CBC with Differential - Ferritin 30 minutes spent with patient between review of chart discussion of testing ordering of testing documentation and time spent with the patient explaining and answering her questions

## 2020-01-15 LAB — BASIC METABOLIC PANEL
BUN/Creatinine Ratio: 16 (ref 12–28)
BUN: 13 mg/dL (ref 8–27)
CO2: 23 mmol/L (ref 20–29)
Calcium: 9.5 mg/dL (ref 8.7–10.3)
Chloride: 102 mmol/L (ref 96–106)
Creatinine, Ser: 0.83 mg/dL (ref 0.57–1.00)
GFR calc Af Amer: 80 mL/min/{1.73_m2} (ref >59)
GFR calc non Af Amer: 70 mL/min/{1.73_m2} (ref >59)
Glucose: 157 mg/dL — ABNORMAL HIGH (ref 65–99)
Potassium: 3.7 mmol/L (ref 3.5–5.2)
Sodium: 142 mmol/L (ref 134–144)

## 2020-01-15 LAB — CBC WITH DIFFERENTIAL/PLATELET
Basophils Absolute: 0 10*3/uL (ref 0.0–0.2)
Basos: 1 %
EOS (ABSOLUTE): 0.2 10*3/uL (ref 0.0–0.4)
Eos: 3 %
Hematocrit: 41 % (ref 34.0–46.6)
Hemoglobin: 13.6 g/dL (ref 11.1–15.9)
Immature Grans (Abs): 0 10*3/uL (ref 0.0–0.1)
Immature Granulocytes: 0 %
Lymphocytes Absolute: 2.4 10*3/uL (ref 0.7–3.1)
Lymphs: 40 %
MCH: 30.8 pg (ref 26.6–33.0)
MCHC: 33.2 g/dL (ref 31.5–35.7)
MCV: 93 fL (ref 79–97)
Monocytes Absolute: 0.4 10*3/uL (ref 0.1–0.9)
Monocytes: 7 %
Neutrophils Absolute: 3.1 10*3/uL (ref 1.4–7.0)
Neutrophils: 49 %
Platelets: 243 10*3/uL (ref 150–450)
RBC: 4.42 x10E6/uL (ref 3.77–5.28)
RDW: 13.1 % (ref 11.7–15.4)
WBC: 6.2 10*3/uL (ref 3.4–10.8)

## 2020-01-15 LAB — HEMOGLOBIN A1C
Est. average glucose Bld gHb Est-mCnc: 154 mg/dL
Hgb A1c MFr Bld: 7 % — ABNORMAL HIGH (ref 4.8–5.6)

## 2020-01-15 LAB — MICROALBUMIN / CREATININE URINE RATIO
Creatinine, Urine: 63.3 mg/dL
Microalb/Creat Ratio: 109 mg/g creat — ABNORMAL HIGH (ref 0–29)
Microalbumin, Urine: 68.8 ug/mL

## 2020-01-15 LAB — FERRITIN: Ferritin: 46 ng/mL (ref 15–150)

## 2020-01-15 LAB — TSH: TSH: 1.59 u[IU]/mL (ref 0.450–4.500)

## 2020-01-16 ENCOUNTER — Encounter: Payer: Self-pay | Admitting: Family Medicine

## 2020-01-28 ENCOUNTER — Other Ambulatory Visit: Payer: Self-pay | Admitting: *Deleted

## 2020-01-28 ENCOUNTER — Telehealth: Payer: Self-pay | Admitting: Family Medicine

## 2020-01-28 MED ORDER — METFORMIN HCL 500 MG PO TABS: 1000.0000 mg | ORAL_TABLET | Freq: Two times a day (BID) | ORAL | 1 refills | Status: DC

## 2020-01-28 MED ORDER — FLUTICASONE PROPIONATE 50 MCG/ACT NA SUSP: NASAL | 0 refills | Status: DC

## 2020-01-28 MED ORDER — DOXAZOSIN MESYLATE 4 MG PO TABS: 4.0000 mg | ORAL_TABLET | Freq: Every day | ORAL | 1 refills | Status: DC

## 2020-01-28 MED ORDER — AMLODIPINE BESYLATE 10 MG PO TABS: ORAL_TABLET | ORAL | 1 refills | Status: DC

## 2020-01-28 MED ORDER — TRIAMTERENE-HCTZ 37.5-25 MG PO TABS: 1.0000 | ORAL_TABLET | Freq: Every day | ORAL | 1 refills | Status: DC

## 2020-01-28 MED ORDER — ONETOUCH ULTRA VI STRP: ORAL_STRIP | 2 refills | Status: DC

## 2020-01-28 MED ORDER — ATORVASTATIN CALCIUM 80 MG PO TABS: 80.0000 mg | ORAL_TABLET | Freq: Every day | ORAL | 1 refills | Status: DC

## 2020-01-28 MED ORDER — CLOPIDOGREL BISULFATE 75 MG PO TABS: 75.0000 mg | ORAL_TABLET | Freq: Every day | ORAL | 1 refills | Status: DC

## 2020-01-28 MED ORDER — FAMOTIDINE 20 MG PO TABS: 20.0000 mg | ORAL_TABLET | Freq: Two times a day (BID) | ORAL | 0 refills | Status: DC

## 2020-01-28 NOTE — Telephone Encounter (Signed)
Pt would like to know if flonase and one touch test strips sent to pharmacy along with her regular prescriptions. She had visit on 4/5 and was told all medications would be updated.    [place], [place]

## 2020-01-28 NOTE — Telephone Encounter (Signed)
It is okay to do all chronic medications for 6 months thank you

## 2020-01-28 NOTE — Telephone Encounter (Signed)
Meds sent to [facility]

## 2020-02-07 DIAGNOSIS — H34832 Tributary (branch) retinal vein occlusion, left eye, with macular edema: Secondary | ICD-10-CM | POA: Diagnosis not present

## 2020-02-07 DIAGNOSIS — H401123 Primary open-angle glaucoma, left eye, severe stage: Secondary | ICD-10-CM | POA: Diagnosis not present

## 2020-02-07 DIAGNOSIS — H401111 Primary open-angle glaucoma, right eye, mild stage: Secondary | ICD-10-CM | POA: Diagnosis not present

## 2020-02-07 DIAGNOSIS — E113299 Type 2 diabetes mellitus with mild nonproliferative diabetic retinopathy without macular edema, unspecified eye: Secondary | ICD-10-CM | POA: Diagnosis not present

## 2020-02-07 DIAGNOSIS — H25813 Combined forms of age-related cataract, bilateral: Secondary | ICD-10-CM | POA: Diagnosis not present

## 2020-02-07 DIAGNOSIS — H468 Other optic neuritis: Secondary | ICD-10-CM | POA: Diagnosis not present

## 2020-02-25 ENCOUNTER — Other Ambulatory Visit: Payer: Self-pay | Admitting: Family Medicine

## 2020-02-26 ENCOUNTER — Ambulatory Visit: Payer: Medicare HMO | Admitting: Nutrition

## 2020-03-12 ENCOUNTER — Encounter: Payer: Medicare HMO | Attending: Family Medicine | Admitting: Nutrition

## 2020-03-12 ENCOUNTER — Other Ambulatory Visit: Payer: Self-pay

## 2020-03-12 ENCOUNTER — Encounter: Payer: Self-pay | Admitting: Nutrition

## 2020-03-12 VITALS — Wt 170.0 lb

## 2020-03-12 DIAGNOSIS — E782 Mixed hyperlipidemia: Secondary | ICD-10-CM | POA: Diagnosis not present

## 2020-03-12 DIAGNOSIS — I10 Essential (primary) hypertension: Secondary | ICD-10-CM

## 2020-03-12 DIAGNOSIS — E669 Obesity, unspecified: Secondary | ICD-10-CM

## 2020-03-12 DIAGNOSIS — E1165 Type 2 diabetes mellitus with hyperglycemia: Secondary | ICD-10-CM | POA: Diagnosis not present

## 2020-03-12 DIAGNOSIS — E118 Type 2 diabetes mellitus with unspecified complications: Secondary | ICD-10-CM | POA: Insufficient documentation

## 2020-03-12 DIAGNOSIS — IMO0002 Reserved for concepts with insufficient information to code with codable children: Secondary | ICD-10-CM

## 2020-03-12 NOTE — Progress Notes (Signed)
  Medical Nutrition Therapy:  Appt start time: 1300 end time:  1315  Assessment:  Primary concerns today: Diabetes Type 2. PCP: Dr. Wolfgang Phoenix  Changes Made  Using an air fryer now.  FBS  A1C 138  Mg/dl Metformin 1000 mg BID. Working in yard. She notes she is watching portions and avoiding snacks between meals.   Lab Results  Component Value Date   HGBA1C 7.0 (H) 01/14/2020   CMP Latest Ref Rng & Units 01/14/2020 09/19/2019 04/19/2019  Glucose 65 - 99 mg/dL 157(H) 157(H) 170(H)  BUN 8 - 27 mg/dL 13 16 13   Creatinine 0.57 - 1.00 mg/dL 0.83 0.88 0.75  Sodium 134 - 144 mmol/L 142 143 143  Potassium 3.5 - 5.2 mmol/L 3.7 3.5 4.4  Chloride 96 - 106 mmol/L 102 101 102  CO2 20 - 29 mmol/L 23 26 25   Calcium 8.7 - 10.3 mg/dL 9.5 9.4 9.4  Total Protein 6.0 - 8.5 g/dL - - 6.7  Total Bilirubin 0.0 - 1.2 mg/dL - - 0.5  Alkaline Phos 39 - 117 IU/L - - 58  AST 0 - 40 IU/L - - 23  ALT 0 - 32 IU/L - - 29     Preferred Learning Style:    No preference indicated   Learning Readiness:     Ready  Change in progress   MEDICATIONS: see list   DIETARY INTAKE:   24-hr recall:  B) basic 4 cereal, mandrin orange,water  L) Cabbage, cornbread, polish sausage, water D) hamburger,  pork and beans, potato salad, . Water Beverages: water,   Usual physical activity: walks,  Estimated energy needs: 1500  calories 170 g carbohydrates 112 g protein 42g fat  Progress Towards Goal(s):  In progress.Goals Nutritional Diagnosis:  NB-1.1 Food and nutrition-related knowledge deficit As related to Diabetes Type 2.  As evidenced by A1C .    Intervention:  Nutrition and Diabetes education provided on My Plate, CHO counting, meal planning, portion sizes, timing of meals, avoiding snacks between meals unless having a low blood sugar, target ranges for A1C and blood sugars, signs/symptoms and treatment of hyper/hypoglycemia, monitoring blood sugars, taking medications as prescribed, benefits of exercising  30 minutes per day and prevention of complications of DM. Marland Kitchen Continue to build on exercise daily Watch portions. Keep up the great job! Be sure to get plenty of lower carb veggies with lunch and dinner. Get A1C to 6.5% or less.  Lose 2 lbs in a month.    Teaching Method Utilized:   Auditory  Handouts given during visit include:  Verbally went over Plate Method, CHO counting and meal planning   Barriers to learning/adherence to lifestyle change: none  Demonstrated degree of understanding via:  Teach Back   Monitoring/Evaluation:  Dietary intake, exercise, , and body weight in 3 month(s).   COVID-19 Vaccine Information can be found at: ShippingScam.co.uk For questions related to vaccine distribution or appointments, please email vaccine@[email or web address] .[email or web address] or call [phone].

## 2020-03-12 NOTE — Patient Instructions (Signed)
Continue to build on exercise daily Watch portions. Keep up the great job! Be sure to get plenty of lower carb veggies with lunch and dinner. Get A1C to 6.5% or less.  Lose 2 lbs in a month.

## 2020-05-08 DIAGNOSIS — H34832 Tributary (branch) retinal vein occlusion, left eye, with macular edema: Secondary | ICD-10-CM | POA: Diagnosis not present

## 2020-05-08 DIAGNOSIS — E113299 Type 2 diabetes mellitus with mild nonproliferative diabetic retinopathy without macular edema, unspecified eye: Secondary | ICD-10-CM | POA: Diagnosis not present

## 2020-05-08 DIAGNOSIS — H401111 Primary open-angle glaucoma, right eye, mild stage: Secondary | ICD-10-CM | POA: Diagnosis not present

## 2020-05-08 DIAGNOSIS — H401123 Primary open-angle glaucoma, left eye, severe stage: Secondary | ICD-10-CM | POA: Diagnosis not present

## 2020-05-08 DIAGNOSIS — H25813 Combined forms of age-related cataract, bilateral: Secondary | ICD-10-CM | POA: Diagnosis not present

## 2020-05-19 ENCOUNTER — Encounter: Payer: Self-pay | Admitting: Family Medicine

## 2020-05-19 ENCOUNTER — Other Ambulatory Visit: Payer: Self-pay

## 2020-05-19 ENCOUNTER — Ambulatory Visit (INDEPENDENT_AMBULATORY_CARE_PROVIDER_SITE_OTHER): Payer: Medicare HMO | Admitting: Family Medicine

## 2020-05-19 VITALS — BP 138/68 | Temp 97.2°F | Ht 64.0 in | Wt 168.2 lb

## 2020-05-19 DIAGNOSIS — Z13820 Encounter for screening for osteoporosis: Secondary | ICD-10-CM | POA: Diagnosis not present

## 2020-05-19 DIAGNOSIS — L659 Nonscarring hair loss, unspecified: Secondary | ICD-10-CM | POA: Diagnosis not present

## 2020-05-19 DIAGNOSIS — J309 Allergic rhinitis, unspecified: Secondary | ICD-10-CM | POA: Diagnosis not present

## 2020-05-19 DIAGNOSIS — E782 Mixed hyperlipidemia: Secondary | ICD-10-CM | POA: Diagnosis not present

## 2020-05-19 DIAGNOSIS — M25552 Pain in left hip: Secondary | ICD-10-CM

## 2020-05-19 DIAGNOSIS — E1159 Type 2 diabetes mellitus with other circulatory complications: Secondary | ICD-10-CM

## 2020-05-19 DIAGNOSIS — Z780 Asymptomatic menopausal state: Secondary | ICD-10-CM

## 2020-05-19 DIAGNOSIS — I10 Essential (primary) hypertension: Secondary | ICD-10-CM | POA: Diagnosis not present

## 2020-05-19 LAB — POCT GLYCOSYLATED HEMOGLOBIN (HGB A1C): Hemoglobin A1C: 6.5 % — AB (ref 4.0–5.6)

## 2020-05-19 MED ORDER — CLOPIDOGREL BISULFATE 75 MG PO TABS: 75.0000 mg | ORAL_TABLET | Freq: Every day | ORAL | 1 refills | Status: DC

## 2020-05-19 MED ORDER — AMOXICILLIN 500 MG PO CAPS
ORAL_CAPSULE | ORAL | 2 refills | Status: DC
Start: 2020-05-19 — End: 2021-07-31

## 2020-05-19 MED ORDER — METFORMIN HCL 500 MG PO TABS: 1000.0000 mg | ORAL_TABLET | Freq: Two times a day (BID) | ORAL | 1 refills | Status: DC

## 2020-05-19 MED ORDER — AMLODIPINE BESYLATE 10 MG PO TABS: ORAL_TABLET | ORAL | 1 refills | Status: DC

## 2020-05-19 MED ORDER — ATORVASTATIN CALCIUM 80 MG PO TABS: 80.0000 mg | ORAL_TABLET | Freq: Every day | ORAL | 1 refills | Status: DC

## 2020-05-19 MED ORDER — TRIAMTERENE-HCTZ 37.5-25 MG PO TABS: 1.0000 | ORAL_TABLET | Freq: Every day | ORAL | 1 refills | Status: DC

## 2020-05-19 MED ORDER — DOXAZOSIN MESYLATE 4 MG PO TABS: 4.0000 mg | ORAL_TABLET | Freq: Every day | ORAL | 1 refills | Status: DC

## 2020-05-19 NOTE — Progress Notes (Signed)
Subjective:    Patient ID: Kara Kirby, female    DOB: 12-10-1945, 74 y.o.   MRN: [id number]  Diabetes She presents for her follow-up diabetic visit. She has type 2 diabetes mellitus. Pertinent negatives for hypoglycemia include no confusion or dizziness. Pertinent negatives for diabetes include no chest pain, no fatigue, no polydipsia, no polyphagia and no weakness. Risk factors for coronary artery disease include hypertension and diabetes mellitus. Current diabetic treatment includes oral agent (monotherapy). She is compliant with treatment all of the time. Her weight is stable. She is following a diabetic diet.  Type 2 diabetes mellitus with other circulatory complication, without long-term current use of insulin ([place]) - Plan: POCT glycosylated hemoglobin (Hb A1C)  Essential hypertension - Plan: Basic metabolic panel, Magnesium  Mixed hyperlipidemia - Plan: Lipid panel, Basic metabolic panel, Magnesium  Allergic rhinitis, unspecified seasonality, unspecified trigger  Left hip pain - Plan: DG Hip Unilat W OR W/O Pelvis 2-3 Views Left  Alopecia - Plan: T4, free, TSH  Post-menopausal - Plan: DG Bone Density  Screening for osteoporosis - Plan: DG Bone Density   Results for orders placed or performed in visit on 05/19/20  POCT glycosylated hemoglobin (Hb A1C)  Result Value Ref Range   Hemoglobin A1C 6.5 (A) 4.0 - 5.6 %   HbA1c POC (<> result, manual entry)     HbA1c, POC (prediabetic range)     HbA1c, POC (controlled diabetic range)        Review of Systems  Constitutional: Negative for activity change, appetite change and fatigue.  HENT: Negative for congestion and rhinorrhea.   Respiratory: Negative for cough and shortness of breath.   Cardiovascular: Negative for chest pain and leg swelling.  Gastrointestinal: Negative for abdominal pain and diarrhea.  Endocrine: Negative for polydipsia and polyphagia.  Skin: Negative for color change.  Neurological: Negative for  dizziness and weakness.  Psychiatric/Behavioral: Negative for behavioral problems and confusion.         Objective:   Physical Exam Vitals reviewed.  Constitutional:      General: She is not in acute distress. HENT:     Head: Normocephalic and atraumatic.  Eyes:     General:        Right eye: No discharge.        Left eye: No discharge.  Neck:     Trachea: No tracheal deviation.  Cardiovascular:     Rate and Rhythm: Normal rate and regular rhythm.     Heart sounds: Normal heart sounds. No murmur heard.   Pulmonary:     Effort: Pulmonary effort is normal. No respiratory distress.     Breath sounds: Normal breath sounds.  Lymphadenopathy:     Cervical: No cervical adenopathy.  Skin:    General: Skin is warm and dry.  Neurological:     Mental Status: She is alert.     Coordination: Coordination normal.  Psychiatric:        Behavior: Behavior normal.    Fall Risk  01/14/2020 10/24/2019 08/20/2019 07/23/2019 05/22/2018  Falls in the past year? 0 0 0 0 No  Number falls in past yr: 0 0 - 0 -  Injury with Fall? 0 0 - 0 -  Follow up Falls evaluation completed - Falls evaluation completed - -          Assessment & Plan:  1. Type 2 diabetes mellitus with other circulatory complication, without long-term current use of insulin ([place]) Diabetes under decent control.  A1c looks good.  We will relook at this again later this year - POCT glycosylated hemoglobin (Hb A1C)  2. Essential hypertension Blood pressure top number at upper limits of normal bottom number looks very good continue current measures watch diet stay active - Basic metabolic panel - Magnesium  3. Mixed hyperlipidemia We will check lipid profile previous ones look good continue medication - Lipid panel - Basic metabolic panel - Magnesium  4. Allergic rhinitis, unspecified seasonality, unspecified trigger This is a ongoing issue for the patient I would recommend the Flonase on a regular basis  5. Left hip  pain Recommend x-ray of the hip due to osteoarthritis and worsening discomfort - DG Hip Unilat W OR W/O Pelvis 2-3 Views Left  6. Alopecia I believe this is age-related with estrogen conversion - T4, free - TSH  7. Post-menopausal Bone density indicated - DG Bone Density  8. Screening for osteoporosis Bone density indicating - DG Bone Density 30 minutes was spent with the patient including previsit chart review, time spent with patient, discussion of health issues, review of data including medical record, and documentation of the visit.

## 2020-05-20 ENCOUNTER — Telehealth: Payer: Self-pay | Admitting: Family Medicine

## 2020-05-20 ENCOUNTER — Other Ambulatory Visit: Payer: Self-pay | Admitting: Family Medicine

## 2020-05-20 DIAGNOSIS — E782 Mixed hyperlipidemia: Secondary | ICD-10-CM

## 2020-05-20 DIAGNOSIS — I10 Essential (primary) hypertension: Secondary | ICD-10-CM

## 2020-05-20 DIAGNOSIS — E1159 Type 2 diabetes mellitus with other circulatory complications: Secondary | ICD-10-CM

## 2020-05-20 LAB — MAGNESIUM: Magnesium: 1.5 mg/dL — ABNORMAL LOW (ref 1.6–2.3)

## 2020-05-20 LAB — BASIC METABOLIC PANEL
BUN/Creatinine Ratio: 17 (ref 12–28)
BUN: 15 mg/dL (ref 8–27)
CO2: 25 mmol/L (ref 20–29)
Calcium: 9.9 mg/dL (ref 8.7–10.3)
Chloride: 100 mmol/L (ref 96–106)
Creatinine, Ser: 0.88 mg/dL (ref 0.57–1.00)
GFR calc Af Amer: 74 mL/min/{1.73_m2} (ref >59)
GFR calc non Af Amer: 64 mL/min/{1.73_m2} (ref >59)
Glucose: 140 mg/dL — ABNORMAL HIGH (ref 65–99)
Potassium: 3.7 mmol/L (ref 3.5–5.2)
Sodium: 140 mmol/L (ref 134–144)

## 2020-05-20 LAB — TSH: TSH: 1.52 u[IU]/mL (ref 0.450–4.500)

## 2020-05-20 LAB — T4, FREE: Free T4: 1.24 ng/dL (ref 0.82–1.77)

## 2020-05-20 LAB — LIPID PANEL
Chol/HDL Ratio: 3.2 ratio (ref 0.0–4.4)
Cholesterol, Total: 139 mg/dL (ref 100–199)
HDL: 43 mg/dL (ref >39)
LDL Chol Calc (NIH): 80 mg/dL (ref 0–99)
Triglycerides: 83 mg/dL (ref 0–149)
VLDL Cholesterol Cal: 16 mg/dL (ref 5–40)

## 2020-05-20 NOTE — Telephone Encounter (Signed)
Error

## 2020-05-20 NOTE — Telephone Encounter (Signed)
error 

## 2020-05-26 ENCOUNTER — Encounter: Payer: Self-pay | Admitting: Family Medicine

## 2020-05-26 ENCOUNTER — Other Ambulatory Visit: Payer: Self-pay

## 2020-05-26 ENCOUNTER — Ambulatory Visit (HOSPITAL_COMMUNITY)
Admission: RE | Admit: 2020-05-26 | Discharge: 2020-05-26 | Disposition: A | Discharge status: Home / Self Care | Payer: Medicare HMO | Source: Ambulatory Visit | Attending: Family Medicine | Admitting: Family Medicine

## 2020-05-26 DIAGNOSIS — Z780 Asymptomatic menopausal state: Secondary | ICD-10-CM | POA: Insufficient documentation

## 2020-05-26 DIAGNOSIS — M25552 Pain in left hip: Secondary | ICD-10-CM | POA: Diagnosis not present

## 2020-05-26 DIAGNOSIS — Z90710 Acquired absence of both cervix and uterus: Secondary | ICD-10-CM | POA: Diagnosis not present

## 2020-05-26 DIAGNOSIS — M1612 Unilateral primary osteoarthritis, left hip: Secondary | ICD-10-CM | POA: Diagnosis not present

## 2020-05-26 DIAGNOSIS — M85852 Other specified disorders of bone density and structure, left thigh: Secondary | ICD-10-CM | POA: Diagnosis not present

## 2020-05-26 DIAGNOSIS — Z13820 Encounter for screening for osteoporosis: Secondary | ICD-10-CM | POA: Diagnosis not present

## 2020-06-27 ENCOUNTER — Other Ambulatory Visit (HOSPITAL_COMMUNITY): Payer: Self-pay | Admitting: Family Medicine

## 2020-06-27 DIAGNOSIS — Z1231 Encounter for screening mammogram for malignant neoplasm of breast: Secondary | ICD-10-CM

## 2020-07-03 DIAGNOSIS — H401111 Primary open-angle glaucoma, right eye, mild stage: Secondary | ICD-10-CM | POA: Diagnosis not present

## 2020-07-03 DIAGNOSIS — H401123 Primary open-angle glaucoma, left eye, severe stage: Secondary | ICD-10-CM | POA: Diagnosis not present

## 2020-07-15 ENCOUNTER — Ambulatory Visit: Payer: Medicare HMO | Admitting: Nutrition

## 2020-07-24 ENCOUNTER — Other Ambulatory Visit: Payer: Self-pay

## 2020-07-24 ENCOUNTER — Encounter: Payer: Self-pay | Admitting: Nutrition

## 2020-07-24 ENCOUNTER — Encounter: Payer: Medicare HMO | Attending: Family Medicine | Admitting: Nutrition

## 2020-07-24 VITALS — Ht 64.0 in | Wt 168.8 lb

## 2020-07-24 DIAGNOSIS — E118 Type 2 diabetes mellitus with unspecified complications: Secondary | ICD-10-CM | POA: Insufficient documentation

## 2020-07-24 DIAGNOSIS — E782 Mixed hyperlipidemia: Secondary | ICD-10-CM

## 2020-07-24 DIAGNOSIS — E1165 Type 2 diabetes mellitus with hyperglycemia: Secondary | ICD-10-CM

## 2020-07-24 DIAGNOSIS — IMO0002 Reserved for concepts with insufficient information to code with codable children: Secondary | ICD-10-CM

## 2020-07-24 DIAGNOSIS — I10 Essential (primary) hypertension: Secondary | ICD-10-CM

## 2020-07-24 DIAGNOSIS — E669 Obesity, unspecified: Secondary | ICD-10-CM | POA: Insufficient documentation

## 2020-07-24 NOTE — Progress Notes (Signed)
  Medical Nutrition Therapy:  Appt start time: 1300 end time:  1315  Assessment:  Primary concerns today: Diabetes Type 2. PCP: Dr. Wolfgang Phoenix  Trying to cut down on potatoes and increase foods high in fiber and lower in carbohydrates. Using air fryer. Metformin 1000 mg BID. Lost 2 lbs. Last A1C 6.5%. Doing well.  Wt Readings from Last 3 Encounters:  07/24/20 168 lb 12.8 oz (76.6 kg)  05/19/20 168 lb 3.2 oz (76.3 kg)  03/12/20 170 lb (77.1 kg)   Ht Readings from Last 3 Encounters:  07/24/20 5\' 4"  (1.626 m)  05/19/20 5\' 4"  (1.626 m)  12/12/19 5\' 4"  (1.626 m)   Body mass index is 28.97 kg/m. @BMIFA @ Facility age limit for growth percentiles is 20 years. Facility age limit for growth percentiles is 20 years.   Lab Results  Component Value Date   HGBA1C 6.5 (A) 05/19/2020   CMP Latest Ref Rng & Units 05/19/2020 01/14/2020 09/19/2019  Glucose 65 - 99 mg/dL 140(H) 157(H) 157(H)  BUN 8 - 27 mg/dL 15 13 16   Creatinine 0.57 - 1.00 mg/dL 0.88 0.83 0.88  Sodium 134 - 144 mmol/L 140 142 143  Potassium 3.5 - 5.2 mmol/L 3.7 3.7 3.5  Chloride 96 - 106 mmol/L 100 102 101  CO2 20 - 29 mmol/L 25 23 26   Calcium 8.7 - 10.3 mg/dL 9.9 9.5 9.4  Total Protein 6.0 - 8.5 g/dL - - -  Total Bilirubin 0.0 - 1.2 mg/dL - - -  Alkaline Phos 39 - 117 IU/L - - -  AST 0 - 40 IU/L - - -  ALT 0 - 32 IU/L - - -     Preferred Learning Style:    No preference indicated   Learning Readiness:     Ready  Change in progress   MEDICATIONS: see list   DIETARY INTAKE:   24-hr recall:  B) Oatmeal, apple  L) Chicken fried, apples and 1 slice bread.,  Water  D)Squash, bread, chicken, water Beverages: water,   Usual physical activity: walks,  Estimated energy needs: 1500  calories 170 g carbohydrates 112 g protein 42g fat  Progress Towards Goal(s):  In progress.Goals Nutritional Diagnosis:  NB-1.1 Food and nutrition-related knowledge deficit As related to Diabetes Type 2.  As evidenced by A1C  .    Intervention:  Nutrition and Diabetes education provided on My Plate, CHO counting, meal planning, portion sizes, timing of meals, avoiding snacks between meals unless having a low blood sugar, target ranges for A1C and blood sugars, signs/symptoms and treatment of hyper/hypoglycemia, monitoring blood sugars, taking medications as prescribed, benefits of exercising 30 minutes per day and prevention of complications of DM. Marland Kitchen Goal  Try some recipes for air fryer Cut out sausage and freid foods due to high cholesterol Keep walking for execise    Teaching Method Utilized:   Auditory  Handouts given during visit include:  Verbally went over Plate Method, CHO counting and meal planning   Barriers to learning/adherence to lifestyle change: none  Demonstrated degree of understanding via:  Teach Back   Monitoring/Evaluation:  Dietary intake, exercise, , and body weight in 3 month(s).

## 2020-07-24 NOTE — Patient Instructions (Addendum)
Goal  Try some recipes for air fryer Cut out sausage and freid foods due to high cholesterol Keep walking for execise

## 2020-08-04 ENCOUNTER — Other Ambulatory Visit: Payer: Self-pay

## 2020-08-04 ENCOUNTER — Ambulatory Visit (HOSPITAL_COMMUNITY)
Admission: RE | Admit: 2020-08-04 | Discharge: 2020-08-04 | Disposition: A | Discharge status: Home / Self Care | Payer: Medicare HMO | Source: Ambulatory Visit | Attending: Family Medicine | Admitting: Family Medicine

## 2020-08-04 DIAGNOSIS — Z1231 Encounter for screening mammogram for malignant neoplasm of breast: Secondary | ICD-10-CM

## 2020-08-06 ENCOUNTER — Encounter: Payer: Self-pay | Admitting: Nutrition

## 2020-08-14 ENCOUNTER — Telehealth: Payer: Self-pay

## 2020-08-14 NOTE — Telephone Encounter (Signed)
Patient id requesting refill on ventolin HFN inhaler sent in to [facility] mail order.

## 2020-08-14 NOTE — Telephone Encounter (Signed)
May have 6 refills 

## 2020-08-14 NOTE — Telephone Encounter (Signed)
Please advise. Thank you

## 2020-08-15 MED ORDER — VENTOLIN HFA 108 (90 BASE) MCG/ACT IN AERS: 2.0000 | INHALATION_SPRAY | RESPIRATORY_TRACT | 5 refills | Status: DC | PRN

## 2020-08-21 DIAGNOSIS — H25813 Combined forms of age-related cataract, bilateral: Secondary | ICD-10-CM | POA: Diagnosis not present

## 2020-08-21 DIAGNOSIS — H34832 Tributary (branch) retinal vein occlusion, left eye, with macular edema: Secondary | ICD-10-CM | POA: Diagnosis not present

## 2020-08-21 DIAGNOSIS — H401130 Primary open-angle glaucoma, bilateral, stage unspecified: Secondary | ICD-10-CM | POA: Diagnosis not present

## 2020-08-21 DIAGNOSIS — E113293 Type 2 diabetes mellitus with mild nonproliferative diabetic retinopathy without macular edema, bilateral: Secondary | ICD-10-CM | POA: Diagnosis not present

## 2020-08-27 ENCOUNTER — Other Ambulatory Visit: Payer: Self-pay | Admitting: Family Medicine

## 2020-09-01 DIAGNOSIS — I10 Essential (primary) hypertension: Secondary | ICD-10-CM | POA: Diagnosis not present

## 2020-09-01 DIAGNOSIS — E782 Mixed hyperlipidemia: Secondary | ICD-10-CM | POA: Diagnosis not present

## 2020-09-01 DIAGNOSIS — E1159 Type 2 diabetes mellitus with other circulatory complications: Secondary | ICD-10-CM | POA: Diagnosis not present

## 2020-09-02 LAB — BASIC METABOLIC PANEL
BUN/Creatinine Ratio: 15 (ref 12–28)
BUN: 14 mg/dL (ref 8–27)
CO2: 25 mmol/L (ref 20–29)
Calcium: 9.5 mg/dL (ref 8.7–10.3)
Chloride: 100 mmol/L (ref 96–106)
Creatinine, Ser: 0.92 mg/dL (ref 0.57–1.00)
GFR calc Af Amer: 70 mL/min/{1.73_m2} (ref >59)
GFR calc non Af Amer: 61 mL/min/{1.73_m2} (ref >59)
Glucose: 160 mg/dL — ABNORMAL HIGH (ref 65–99)
Potassium: 3.6 mmol/L (ref 3.5–5.2)
Sodium: 140 mmol/L (ref 134–144)

## 2020-09-02 LAB — LIPID PANEL
Chol/HDL Ratio: 3.1 ratio (ref 0.0–4.4)
Cholesterol, Total: 136 mg/dL (ref 100–199)
HDL: 44 mg/dL (ref >39)
LDL Chol Calc (NIH): 75 mg/dL (ref 0–99)
Triglycerides: 88 mg/dL (ref 0–149)
VLDL Cholesterol Cal: 17 mg/dL (ref 5–40)

## 2020-09-02 LAB — MAGNESIUM: Magnesium: 1.5 mg/dL — ABNORMAL LOW (ref 1.6–2.3)

## 2020-09-02 LAB — HEMOGLOBIN A1C
Est. average glucose Bld gHb Est-mCnc: 146 mg/dL
Hgb A1c MFr Bld: 6.7 % — ABNORMAL HIGH (ref 4.8–5.6)

## 2020-09-03 DIAGNOSIS — H401123 Primary open-angle glaucoma, left eye, severe stage: Secondary | ICD-10-CM | POA: Diagnosis not present

## 2020-09-03 DIAGNOSIS — H401111 Primary open-angle glaucoma, right eye, mild stage: Secondary | ICD-10-CM | POA: Diagnosis not present

## 2020-09-08 ENCOUNTER — Encounter: Payer: Self-pay | Admitting: Family Medicine

## 2020-09-08 ENCOUNTER — Ambulatory Visit (INDEPENDENT_AMBULATORY_CARE_PROVIDER_SITE_OTHER): Payer: Medicare HMO | Admitting: Family Medicine

## 2020-09-08 ENCOUNTER — Other Ambulatory Visit: Payer: Self-pay

## 2020-09-08 VITALS — BP 128/84 | Temp 97.4°F | Ht 64.0 in | Wt 167.0 lb

## 2020-09-08 DIAGNOSIS — I700 Atherosclerosis of aorta: Secondary | ICD-10-CM

## 2020-09-08 DIAGNOSIS — E1159 Type 2 diabetes mellitus with other circulatory complications: Secondary | ICD-10-CM

## 2020-09-08 DIAGNOSIS — J683 Other acute and subacute respiratory conditions due to chemicals, gases, fumes and vapors: Secondary | ICD-10-CM | POA: Insufficient documentation

## 2020-09-08 DIAGNOSIS — I6523 Occlusion and stenosis of bilateral carotid arteries: Secondary | ICD-10-CM | POA: Diagnosis not present

## 2020-09-08 DIAGNOSIS — I10 Essential (primary) hypertension: Secondary | ICD-10-CM

## 2020-09-08 DIAGNOSIS — E1169 Type 2 diabetes mellitus with other specified complication: Secondary | ICD-10-CM

## 2020-09-08 DIAGNOSIS — E785 Hyperlipidemia, unspecified: Secondary | ICD-10-CM

## 2020-09-08 MED ORDER — ONETOUCH ULTRA VI STRP
ORAL_STRIP | 11 refills | Status: DC
Start: 2020-09-08 — End: 2021-10-27

## 2020-09-08 NOTE — Patient Instructions (Addendum)
Results for orders placed or performed in visit on 05/20/20  HgB A1c  Result Value Ref Range   Hgb A1c MFr Bld 6.7 (H) 4.8 - 5.6 %   Est. average glucose Bld gHb Est-mCnc 146 mg/dL  Magnesium  Result Value Ref Range   Magnesium 1.5 (L) 1.6 - 2.3 mg/dL  Basic Metabolic Panel (BMET)  Result Value Ref Range   Glucose 160 (H) 65 - 99 mg/dL   BUN 14 8 - 27 mg/dL   Creatinine, Ser 0.92 0.57 - 1.00 mg/dL   GFR calc non Af Amer 61 >59 mL/min/1.73   GFR calc Af Amer 70 >59 mL/min/1.73   BUN/Creatinine Ratio 15 12 - 28   Sodium 140 134 - 144 mmol/L   Potassium 3.6 3.5 - 5.2 mmol/L   Chloride 100 96 - 106 mmol/L   CO2 25 20 - 29 mmol/L   Calcium 9.5 8.7 - 10.3 mg/dL  Lipid Profile  Result Value Ref Range   Cholesterol, Total 136 100 - 199 mg/dL   Triglycerides 88 0 - 149 mg/dL   HDL 44 >39 mg/dL   VLDL Cholesterol Cal 17 5 - 40 mg/dL   LDL Chol Calc (NIH) 75 0 - 99 mg/dL   Chol/HDL Ratio 3.1 0.0 - 4.4 ratio    How to Use a Metered Dose Inhaler A metered dose inhaler is a handheld device for taking medicine that must be breathed into the lungs (inhaled). The device can be used to deliver a variety of inhaled medicines, including:  Quick relief or rescue medicines, such as bronchodilators.  Controller medicines, such as corticosteroids. The medicine is delivered by pushing down on a metal canister to release a preset amount of spray and medicine. Each device contains the amount of medicine that is needed for a preset number of uses (inhalations). Your health care provider may recommend that you use a spacer with your inhaler to help you take the medicine more effectively. A spacer is a plastic tube with a mouthpiece on one end and an opening that connects to the inhaler on the other end. A spacer holds the medicine in a tube for a short time, which allows you to inhale more medicine. What are the risks? If you do not use your inhaler correctly, medicine might not reach your lungs to help  you breathe. Inhaler medicine can cause side effects, such as:  Mouth or throat infection.  Cough.  Hoarseness.  Headache.  Nausea and vomiting.  Lung infection (pneumonia) in people who have a lung condition called COPD. How to use a metered dose inhaler without a spacer  1. Remove the cap from the inhaler. 2. If you are using the inhaler for the first time, shake it for 5 seconds, turn it away from your face, then release 4 puffs into the air. This is called priming. 3. Shake the inhaler for 5 seconds. 4. Position the inhaler so the top of the canister faces up. 5. Put your index finger on the top of the medicine canister. Support the bottom of the inhaler with your thumb. 6. Breathe out normally and as completely as possible, away from the inhaler. 7. Either place the inhaler between your teeth and close your lips tightly around the mouthpiece, or hold the inhaler 1-2 inches (2.5-5 cm) away from your open mouth. Keep your tongue down out of the way. If you are unsure which technique to use, ask your health care provider. 8. Press the canister down with your index  finger to release the medicine, then inhale deeply and slowly through your mouth (not your nose) until your lungs are completely filled. Inhaling should take 4-6 seconds. 9. Hold the medicine in your lungs for 5-10 seconds (10 seconds is best). This helps the medicine get into the small airways of your lungs. 10. With your lips in a tight circle (pursed), breathe out slowly. 11. Repeat steps 3-10 until you have taken the number of puffs that your health care provider directed. Wait about 1 minute between puffs or as directed. 12. Put the cap on the inhaler. 13. If you are using a steroid inhaler, rinse your mouth with water, gargle, and spit out the water. Do not swallow the water. How to use a metered dose inhaler with a spacer  1. Remove the cap from the inhaler. 2. If you are using the inhaler for the first time, shake  it for 5 seconds, turn it away from your face, then release 4 puffs into the air. This is called priming. 3. Shake the inhaler for 5 seconds. 4. Place the open end of the spacer onto the inhaler mouthpiece. 5. Position the inhaler so the top of the canister faces up and the spacer mouthpiece faces you. 6. Put your index finger on the top of the medicine canister. Support the bottom of the inhaler and the spacer with your thumb. 7. Breathe out normally and as completely as possible, away from the spacer. 8. Place the spacer between your teeth and close your lips tightly around it. Keep your tongue down out of the way. 9. Press the canister down with your index finger to release the medicine, then inhale deeply and slowly through your mouth (not your nose) until your lungs are completely filled. Inhaling should take 4-6 seconds. 10. Hold the medicine in your lungs for 5-10 seconds (10 seconds is best). This helps the medicine get into the small airways of your lungs. 11. With your lips in a tight circle (pursed), breathe out slowly. 12. Repeat steps 3-11 until you have taken the number of puffs that your health care provider directed. Wait about 1 minute between puffs or as directed. 13. Remove the spacer from the inhaler and put the cap on the inhaler. 14. If you are using a steroid inhaler, rinse your mouth with water, gargle, and spit out the water. Do not swallow the water. Follow these instructions at home:  Take your inhaled medicine only as told by your health care provider. Do not use the inhaler more than directed by your health care provider.  Keep all follow-up visits as told by your health care provider. This is important.  If your inhaler has a counter, you can check it to determine how full your inhaler is. If your inhaler does not have a counter, ask your health care provider when you will need to refill your inhaler and write the refill date on a calendar or on your inhaler canister.  Note that you cannot know when an inhaler is empty by shaking it.  Follow directions on the package insert for care and cleaning of your inhaler and spacer. Contact a health care provider if:  Symptoms are only partially relieved with your inhaler.  You are having trouble using your inhaler.  You have an increase in phlegm.  You have headaches. Get help right away if:  You feel little or no relief after using your inhaler.  You have dizziness.  You have a fast heart rate.  You  have chills or a fever.  You have night sweats.  There is blood in your phlegm. Summary  A metered dose inhaler is a handheld device for taking medicine that must be breathed into the lungs (inhaled).  The medicine is delivered by pushing down on a metal canister to release a preset amount of spray and medicine.  Each device contains the amount of medicine that is needed for a preset number of uses (inhalations). This information is not intended to replace advice given to you by your health care provider. Make sure you discuss any questions you have with your health care provider. Document Revised: 09/09/2017 Document Reviewed: 08/17/2016 Elsevier Patient Education  [place] DASH stands for "Dietary Approaches to Stop Hypertension." The DASH eating plan is a healthy eating plan that has been shown to reduce high blood pressure (hypertension). It may also reduce your risk for type 2 diabetes, heart disease, and stroke. The DASH eating plan may also help with weight loss. What are tips for following this plan?  General guidelines  Avoid eating more than 2,300 mg (milligrams) of salt (sodium) a day. If you have hypertension, you may need to reduce your sodium intake to 1,500 mg a day.  Limit alcohol intake to no more than 1 drink a day for nonpregnant women and 2 drinks a day for men. One drink equals 12 oz of beer, 5 oz of wine, or 1 oz of hard liquor.  Work with your  health care provider to maintain a healthy body weight or to lose weight. Ask what an ideal weight is for you.  Get at least 30 minutes of exercise that causes your heart to beat faster (aerobic exercise) most days of the week. Activities may include walking, swimming, or biking.  Work with your health care provider or diet and nutrition specialist (dietitian) to adjust your eating plan to your individual calorie needs. Reading food labels   Check food labels for the amount of sodium per serving. Choose foods with less than 5 percent of the Daily Value of sodium. Generally, foods with less than 300 mg of sodium per serving fit into this eating plan.  To find whole grains, look for the word "whole" as the first word in the ingredient list. Shopping  Buy products labeled as "low-sodium" or "no salt added."  Buy fresh foods. Avoid canned foods and premade or frozen meals. Cooking  Avoid adding salt when cooking. Use salt-free seasonings or herbs instead of table salt or sea salt. Check with your health care provider or pharmacist before using salt substitutes.  Do not fry foods. Cook foods using healthy methods such as baking, boiling, grilling, and broiling instead.  Cook with heart-healthy oils, such as olive, canola, soybean, or sunflower oil. Meal planning  Eat a balanced diet that includes: ? 5 or more servings of fruits and vegetables each day. At each meal, try to fill half of your plate with fruits and vegetables. ? Up to 6-8 servings of whole grains each day. ? Less than 6 oz of lean meat, poultry, or fish each day. A 3-oz serving of meat is about the same size as a deck of cards. One egg equals 1 oz. ? 2 servings of low-fat dairy each day. ? A serving of nuts, seeds, or beans 5 times each week. ? Heart-healthy fats. Healthy fats called Omega-3 fatty acids are found in foods such as flaxseeds and coldwater fish, like sardines, salmon, and mackerel.  Limit how much you eat of  the following: ? Canned or prepackaged foods. ? Food that is high in trans fat, such as fried foods. ? Food that is high in saturated fat, such as fatty meat. ? Sweets, desserts, sugary drinks, and other foods with added sugar. ? Full-fat dairy products.  Do not salt foods before eating.  Try to eat at least 2 vegetarian meals each week.  Eat more home-cooked food and less restaurant, buffet, and fast food.  When eating at a restaurant, ask that your food be prepared with less salt or no salt, if possible. What foods are recommended? The items listed may not be a complete list. Talk with your dietitian about what dietary choices are best for you. Grains Whole-grain or whole-wheat bread. Whole-grain or whole-wheat pasta. Brown rice. [place]. Bulgur. Whole-grain and low-sodium cereals. Pita bread. Low-fat, low-sodium crackers. Whole-wheat flour tortillas. Vegetables Fresh or frozen vegetables (raw, steamed, roasted, or grilled). Low-sodium or reduced-sodium tomato and vegetable juice. Low-sodium or reduced-sodium tomato sauce and tomato paste. Low-sodium or reduced-sodium canned vegetables. Fruits All fresh, dried, or frozen fruit. Canned fruit in natural juice (without added sugar). Meat and other protein foods Skinless chicken or [place]. Ground chicken or [place]. Pork with fat trimmed off. Fish and seafood. Egg whites. Dried beans, peas, or lentils. Unsalted nuts, nut butters, and seeds. Unsalted canned beans. Lean cuts of beef with fat trimmed off. Low-sodium, lean deli meat. Dairy Low-fat (1%) or fat-free (skim) milk. Fat-free, low-fat, or reduced-fat cheeses. Nonfat, low-sodium ricotta or cottage cheese. Low-fat or nonfat yogurt. Low-fat, low-sodium cheese. Fats and oils Soft margarine without trans fats. Vegetable oil. Low-fat, reduced-fat, or light mayonnaise and salad dressings (reduced-sodium). Canola, safflower, olive, soybean, and sunflower oils. Avocado. Seasoning and  other foods Herbs. Spices. Seasoning mixes without salt. Unsalted popcorn and pretzels. Fat-free sweets. What foods are not recommended? The items listed may not be a complete list. Talk with your dietitian about what dietary choices are best for you. Grains Baked goods made with fat, such as croissants, muffins, or some breads. Dry pasta or rice meal packs. Vegetables Creamed or fried vegetables. Vegetables in a cheese sauce. Regular canned vegetables (not low-sodium or reduced-sodium). Regular canned tomato sauce and paste (not low-sodium or reduced-sodium). Regular tomato and vegetable juice (not low-sodium or reduced-sodium). Angie Fava. Olives. Fruits Canned fruit in a light or heavy syrup. Fried fruit. Fruit in cream or butter sauce. Meat and other protein foods Fatty cuts of meat. Ribs. Fried meat. Berniece Salines. Sausage. Bologna and other processed lunch meats. Salami. Fatback. Hotdogs. Bratwurst. Salted nuts and seeds. Canned beans with added salt. Canned or smoked fish. Whole eggs or egg yolks. Chicken or [place] with skin. Dairy Whole or 2% milk, cream, and half-and-half. Whole or full-fat cream cheese. Whole-fat or sweetened yogurt. Full-fat cheese. Nondairy creamers. Whipped toppings. Processed cheese and cheese spreads. Fats and oils Butter. Stick margarine. Lard. Shortening. Ghee. Bacon fat. Tropical oils, such as coconut, palm kernel, or palm oil. Seasoning and other foods Salted popcorn and pretzels. Onion salt, garlic salt, seasoned salt, table salt, and sea salt. Worcestershire sauce. Tartar sauce. Barbecue sauce. Teriyaki sauce. Soy sauce, including reduced-sodium. Steak sauce. Canned and packaged gravies. Fish sauce. Oyster sauce. Cocktail sauce. Horseradish that you find on the shelf. Ketchup. Mustard. Meat flavorings and tenderizers. Bouillon cubes. Hot sauce and Tabasco sauce. Premade or packaged marinades. Premade or packaged taco seasonings. Relishes. Regular salad dressings. Where to  find more information:  National Heart, Lung, and  Blood Institute: https://wilson-eaton.com/  American Heart Association: www.heart.org Summary  The DASH eating plan is a healthy eating plan that has been shown to reduce high blood pressure (hypertension). It may also reduce your risk for type 2 diabetes, heart disease, and stroke.  With the DASH eating plan, you should limit salt (sodium) intake to 2,300 mg a day. If you have hypertension, you may need to reduce your sodium intake to 1,500 mg a day.  When on the DASH eating plan, aim to eat more fresh fruits and vegetables, whole grains, lean proteins, low-fat dairy, and heart-healthy fats.  Work with your health care provider or diet and nutrition specialist (dietitian) to adjust your eating plan to your individual calorie needs. This information is not intended to replace advice given to you by your health care provider. Make sure you discuss any questions you have with your health care provider. Document Revised: 09/09/2017 Document Reviewed: 09/20/2016 Elsevier Patient Education  2020 [employer].

## 2020-09-08 NOTE — Progress Notes (Signed)
Subjective:    Patient ID: Kara Kirby, female    DOB: 09/28/1946, 74 y.o.   MRN: [id number]  Diabetes She presents for her follow-up diabetic visit. She has type 2 diabetes mellitus. Pertinent negatives for hypoglycemia include no confusion or dizziness. Pertinent negatives for diabetes include no chest pain, no fatigue, no polydipsia, no polyphagia and no weakness. Risk factors for coronary artery disease include dyslipidemia, diabetes mellitus, post-menopausal and hypertension. Current diabetic treatment includes oral agent (monotherapy). She is compliant with treatment all of the time. Her weight is stable. She is following a diabetic diet.   Results for orders placed or performed in visit on 05/20/20  HgB A1c  Result Value Ref Range   Hgb A1c MFr Bld 6.7 (H) 4.8 - 5.6 %   Est. average glucose Bld gHb Est-mCnc 146 mg/dL  Magnesium  Result Value Ref Range   Magnesium 1.5 (L) 1.6 - 2.3 mg/dL  Basic Metabolic Panel (BMET)  Result Value Ref Range   Glucose 160 (H) 65 - 99 mg/dL   BUN 14 8 - 27 mg/dL   Creatinine, Ser 0.92 0.57 - 1.00 mg/dL   GFR calc non Af Amer 61 >59 mL/min/1.73   GFR calc Af Amer 70 >59 mL/min/1.73   BUN/Creatinine Ratio 15 12 - 28   Sodium 140 134 - 144 mmol/L   Potassium 3.6 3.5 - 5.2 mmol/L   Chloride 100 96 - 106 mmol/L   CO2 25 20 - 29 mmol/L   Calcium 9.5 8.7 - 10.3 mg/dL  Lipid Profile  Result Value Ref Range   Cholesterol, Total 136 100 - 199 mg/dL   Triglycerides 88 0 - 149 mg/dL   HDL 44 >39 mg/dL   VLDL Cholesterol Cal 17 5 - 40 mg/dL   LDL Chol Calc (NIH) 75 0 - 99 mg/dL   Chol/HDL Ratio 3.1 0.0 - 4.4 ratio    Discuss recent labs  Review of Systems  Constitutional: Negative for activity change, appetite change and fatigue.  HENT: Negative for congestion and rhinorrhea.   Respiratory: Negative for cough and shortness of breath.   Cardiovascular: Negative for chest pain and leg swelling.  Gastrointestinal: Negative for abdominal  pain and diarrhea.  Endocrine: Negative for polydipsia and polyphagia.  Skin: Negative for color change.  Neurological: Negative for dizziness and weakness.  Psychiatric/Behavioral: Negative for behavioral problems and confusion.       Objective:   Physical Exam Vitals reviewed.  Constitutional:      General: She is not in acute distress. HENT:     Head: Normocephalic and atraumatic.  Eyes:     General:        Right eye: No discharge.        Left eye: No discharge.  Neck:     Trachea: No tracheal deviation.  Cardiovascular:     Rate and Rhythm: Normal rate and regular rhythm.     Heart sounds: Normal heart sounds. No murmur heard.   Pulmonary:     Effort: Pulmonary effort is normal. No respiratory distress.     Breath sounds: Normal breath sounds.  Lymphadenopathy:     Cervical: No cervical adenopathy.  Skin:    General: Skin is warm and dry.  Neurological:     Mental Status: She is alert.     Coordination: Coordination normal.  Psychiatric:        Behavior: Behavior normal.           Assessment & Plan:  1. Primary  hypertension Blood pressure good control continue current measures take magnesium every day because it is low - Magnesium  2. Hyperlipidemia associated with type 2 diabetes mellitus ([place]) Continue Lipitor on a regular basis.  Watch diet try to get LDL below 70 cut back on [place] fries  3. Type 2 diabetes mellitus with other circulatory complication, without long-term current use of insulin ([place]) Diabetes recheck A1c again in several months keep A1c in the sixes to lessen the risk of strokes - Hemoglobin F0O - Basic metabolic panel  4. Bilateral carotid artery stenosis Followed by vascular surgery  5. Aortic atherosclerosis (HCC) Healthy diet, Lipitor, keep LDL below 70  6. Reactive airways dysfunction syndrome, mild intermittent, uncomplicated (HCC) Albuterol as needed uses it a few times per month proper way to use the inhaler was shown I do  not feel patient has COPD  Hypomagnesia check magnesium level

## 2020-09-15 ENCOUNTER — Ambulatory Visit (HOSPITAL_COMMUNITY)
Admission: RE | Admit: 2020-09-15 | Discharge: 2020-09-15 | Disposition: A | Discharge status: Home / Self Care | Payer: Medicare HMO | Source: Ambulatory Visit | Attending: Family Medicine | Admitting: Family Medicine

## 2020-09-15 ENCOUNTER — Other Ambulatory Visit: Payer: Self-pay

## 2020-09-15 DIAGNOSIS — Z1231 Encounter for screening mammogram for malignant neoplasm of breast: Secondary | ICD-10-CM | POA: Diagnosis not present

## 2020-09-24 ENCOUNTER — Other Ambulatory Visit: Payer: Medicare HMO

## 2020-09-24 ENCOUNTER — Other Ambulatory Visit: Payer: Self-pay

## 2020-09-24 NOTE — Patient Instructions (Signed)
DASH Eating Plan DASH stands for "Dietary Approaches to Stop Hypertension." The DASH eating plan is a healthy eating plan that has been shown to reduce high blood pressure (hypertension). It may also reduce your risk for type 2 diabetes, heart disease, and stroke. The DASH eating plan may also help with weight loss. What are tips for following this plan?  General guidelines  Avoid eating more than 2,300 mg (milligrams) of salt (sodium) a day. If you have hypertension, you may need to reduce your sodium intake to 1,500 mg a day.  Limit alcohol intake to no more than 1 drink a day for nonpregnant women and 2 drinks a day for men. One drink equals 12 oz of beer, 5 oz of wine, or 1 oz of hard liquor.  Work with your health care provider to maintain a healthy body weight or to lose weight. Ask what an ideal weight is for you.  Get at least 30 minutes of exercise that causes your heart to beat faster (aerobic exercise) most days of the week. Activities may include walking, swimming, or biking.  Work with your health care provider or diet and nutrition specialist (dietitian) to adjust your eating plan to your individual calorie needs. Reading food labels   Check food labels for the amount of sodium per serving. Choose foods with less than 5 percent of the Daily Value of sodium. Generally, foods with less than 300 mg of sodium per serving fit into this eating plan.  To find whole grains, look for the word "whole" as the first word in the ingredient list. Shopping  Buy products labeled as "low-sodium" or "no salt added."  Buy fresh foods. Avoid canned foods and premade or frozen meals. Cooking  Avoid adding salt when cooking. Use salt-free seasonings or herbs instead of table salt or sea salt. Check with your health care provider or pharmacist before using salt substitutes.  Do not fry foods. Cook foods using healthy methods such as baking, boiling, grilling, and broiling instead.  Cook with  heart-healthy oils, such as olive, canola, soybean, or sunflower oil. Meal planning  Eat a balanced diet that includes: ? 5 or more servings of fruits and vegetables each day. At each meal, try to fill half of your plate with fruits and vegetables. ? Up to 6-8 servings of whole grains each day. ? Less than 6 oz of lean meat, poultry, or fish each day. A 3-oz serving of meat is about the same size as a deck of cards. One egg equals 1 oz. ? 2 servings of low-fat dairy each day. ? A serving of nuts, seeds, or beans 5 times each week. ? Heart-healthy fats. Healthy fats called Omega-3 fatty acids are found in foods such as flaxseeds and coldwater fish, like sardines, salmon, and mackerel.  Limit how much you eat of the following: ? Canned or prepackaged foods. ? Food that is high in trans fat, such as fried foods. ? Food that is high in saturated fat, such as fatty meat. ? Sweets, desserts, sugary drinks, and other foods with added sugar. ? Full-fat dairy products.  Do not salt foods before eating.  Try to eat at least 2 vegetarian meals each week.  Eat more home-cooked food and less restaurant, buffet, and fast food.  When eating at a restaurant, ask that your food be prepared with less salt or no salt, if possible. What foods are recommended? The items listed may not be a complete list. Talk with your dietitian about   what dietary choices are best for you. Grains Whole-grain or whole-wheat bread. Whole-grain or whole-wheat pasta. Brown rice. Oatmeal. Quinoa. Bulgur. Whole-grain and low-sodium cereals. Pita bread. Low-fat, low-sodium crackers. Whole-wheat flour tortillas. Vegetables Fresh or frozen vegetables (raw, steamed, roasted, or grilled). Low-sodium or reduced-sodium tomato and vegetable juice. Low-sodium or reduced-sodium tomato sauce and tomato paste. Low-sodium or reduced-sodium canned vegetables. Fruits All fresh, dried, or frozen fruit. Canned fruit in natural juice (without  added sugar). Meat and other protein foods Skinless chicken or turkey. Ground chicken or turkey. Pork with fat trimmed off. Fish and seafood. Egg whites. Dried beans, peas, or lentils. Unsalted nuts, nut butters, and seeds. Unsalted canned beans. Lean cuts of beef with fat trimmed off. Low-sodium, lean deli meat. Dairy Low-fat (1%) or fat-free (skim) milk. Fat-free, low-fat, or reduced-fat cheeses. Nonfat, low-sodium ricotta or cottage cheese. Low-fat or nonfat yogurt. Low-fat, low-sodium cheese. Fats and oils Soft margarine without trans fats. Vegetable oil. Low-fat, reduced-fat, or light mayonnaise and salad dressings (reduced-sodium). Canola, safflower, olive, soybean, and sunflower oils. Avocado. Seasoning and other foods Herbs. Spices. Seasoning mixes without salt. Unsalted popcorn and pretzels. Fat-free sweets. What foods are not recommended? The items listed may not be a complete list. Talk with your dietitian about what dietary choices are best for you. Grains Baked goods made with fat, such as croissants, muffins, or some breads. Dry pasta or rice meal packs. Vegetables Creamed or fried vegetables. Vegetables in a cheese sauce. Regular canned vegetables (not low-sodium or reduced-sodium). Regular canned tomato sauce and paste (not low-sodium or reduced-sodium). Regular tomato and vegetable juice (not low-sodium or reduced-sodium). Pickles. Olives. Fruits Canned fruit in a light or heavy syrup. Fried fruit. Fruit in cream or butter sauce. Meat and other protein foods Fatty cuts of meat. Ribs. Fried meat. Bacon. Sausage. Bologna and other processed lunch meats. Salami. Fatback. Hotdogs. Bratwurst. Salted nuts and seeds. Canned beans with added salt. Canned or smoked fish. Whole eggs or egg yolks. Chicken or turkey with skin. Dairy Whole or 2% milk, cream, and half-and-half. Whole or full-fat cream cheese. Whole-fat or sweetened yogurt. Full-fat cheese. Nondairy creamers. Whipped toppings.  Processed cheese and cheese spreads. Fats and oils Butter. Stick margarine. Lard. Shortening. Ghee. Bacon fat. Tropical oils, such as coconut, palm kernel, or palm oil. Seasoning and other foods Salted popcorn and pretzels. Onion salt, garlic salt, seasoned salt, table salt, and sea salt. Worcestershire sauce. Tartar sauce. Barbecue sauce. Teriyaki sauce. Soy sauce, including reduced-sodium. Steak sauce. Canned and packaged gravies. Fish sauce. Oyster sauce. Cocktail sauce. Horseradish that you find on the shelf. Ketchup. Mustard. Meat flavorings and tenderizers. Bouillon cubes. Hot sauce and Tabasco sauce. Premade or packaged marinades. Premade or packaged taco seasonings. Relishes. Regular salad dressings. Where to find more information:  National Heart, Lung, and Blood Institute: www.nhlbi.nih.gov  American Heart Association: www.heart.org Summary  The DASH eating plan is a healthy eating plan that has been shown to reduce high blood pressure (hypertension). It may also reduce your risk for type 2 diabetes, heart disease, and stroke.  With the DASH eating plan, you should limit salt (sodium) intake to 2,300 mg a day. If you have hypertension, you may need to reduce your sodium intake to 1,500 mg a day.  When on the DASH eating plan, aim to eat more fresh fruits and vegetables, whole grains, lean proteins, low-fat dairy, and heart-healthy fats.  Work with your health care provider or diet and nutrition specialist (dietitian) to adjust your eating plan to your   individual calorie needs. This information is not intended to replace advice given to you by your health care provider. Make sure you discuss any questions you have with your health care provider. Document Revised: 09/09/2017 Document Reviewed: 09/20/2016 Elsevier Patient Education  2020 Elsevier Inc.  

## 2020-10-13 DIAGNOSIS — H401111 Primary open-angle glaucoma, right eye, mild stage: Secondary | ICD-10-CM | POA: Diagnosis not present

## 2020-10-13 DIAGNOSIS — H401123 Primary open-angle glaucoma, left eye, severe stage: Secondary | ICD-10-CM | POA: Diagnosis not present

## 2020-10-22 ENCOUNTER — Telehealth: Payer: Self-pay

## 2020-10-22 ENCOUNTER — Ambulatory Visit: Payer: Medicare HMO | Admitting: Family Medicine

## 2020-10-22 NOTE — Telephone Encounter (Signed)
Pt rescheduled to March 2nd will she need blood work by then?  Pt call back [phone]

## 2020-10-22 NOTE — Telephone Encounter (Signed)
The patient has lab work which was ordered at the end of November she is to get this completed before her follow-up visit thank you

## 2020-10-22 NOTE — Telephone Encounter (Signed)
Last labs 08/2020: Lipid, met 7, Mg and HgbA1c

## 2020-10-23 ENCOUNTER — Other Ambulatory Visit: Payer: Self-pay | Admitting: Family Medicine

## 2020-10-23 NOTE — Telephone Encounter (Signed)
Left message to return call 

## 2020-10-23 NOTE — Telephone Encounter (Addendum)
Patient notified and verbalized understanding. 

## 2020-10-29 ENCOUNTER — Ambulatory Visit: Payer: Medicare HMO | Admitting: Nutrition

## 2020-11-13 ENCOUNTER — Ambulatory Visit: Payer: Medicare HMO | Admitting: Nutrition

## 2020-11-24 ENCOUNTER — Other Ambulatory Visit: Payer: Self-pay | Admitting: Family Medicine

## 2020-11-24 DIAGNOSIS — H401123 Primary open-angle glaucoma, left eye, severe stage: Secondary | ICD-10-CM | POA: Diagnosis not present

## 2020-11-24 DIAGNOSIS — H401111 Primary open-angle glaucoma, right eye, mild stage: Secondary | ICD-10-CM | POA: Diagnosis not present

## 2020-11-27 DIAGNOSIS — H34832 Tributary (branch) retinal vein occlusion, left eye, with macular edema: Secondary | ICD-10-CM | POA: Diagnosis not present

## 2020-11-27 DIAGNOSIS — H25813 Combined forms of age-related cataract, bilateral: Secondary | ICD-10-CM | POA: Diagnosis not present

## 2020-11-27 DIAGNOSIS — E113299 Type 2 diabetes mellitus with mild nonproliferative diabetic retinopathy without macular edema, unspecified eye: Secondary | ICD-10-CM | POA: Diagnosis not present

## 2020-12-01 DIAGNOSIS — E1159 Type 2 diabetes mellitus with other circulatory complications: Secondary | ICD-10-CM | POA: Diagnosis not present

## 2020-12-01 DIAGNOSIS — I10 Essential (primary) hypertension: Secondary | ICD-10-CM | POA: Diagnosis not present

## 2020-12-02 LAB — BASIC METABOLIC PANEL
BUN/Creatinine Ratio: 18 (ref 12–28)
BUN: 18 mg/dL (ref 8–27)
CO2: 24 mmol/L (ref 20–29)
Calcium: 9.3 mg/dL (ref 8.7–10.3)
Chloride: 103 mmol/L (ref 96–106)
Creatinine, Ser: 1 mg/dL (ref 0.57–1.00)
GFR calc Af Amer: 64 mL/min/{1.73_m2} (ref >59)
GFR calc non Af Amer: 55 mL/min/{1.73_m2} — ABNORMAL LOW (ref >59)
Glucose: 164 mg/dL — ABNORMAL HIGH (ref 65–99)
Potassium: 3.8 mmol/L (ref 3.5–5.2)
Sodium: 143 mmol/L (ref 134–144)

## 2020-12-02 LAB — MAGNESIUM: Magnesium: 1.5 mg/dL — ABNORMAL LOW (ref 1.6–2.3)

## 2020-12-02 LAB — HEMOGLOBIN A1C
Est. average glucose Bld gHb Est-mCnc: 146 mg/dL
Hgb A1c MFr Bld: 6.7 % — ABNORMAL HIGH (ref 4.8–5.6)

## 2020-12-10 ENCOUNTER — Ambulatory Visit: Payer: Medicare HMO | Admitting: Family Medicine

## 2020-12-11 ENCOUNTER — Other Ambulatory Visit: Payer: Self-pay | Admitting: Family Medicine

## 2020-12-18 ENCOUNTER — Ambulatory Visit (HOSPITAL_COMMUNITY)
Admission: RE | Admit: 2020-12-18 | Discharge: 2020-12-18 | Disposition: A | Discharge status: Home / Self Care | Payer: Medicare HMO | Source: Ambulatory Visit | Attending: Vascular Surgery | Admitting: Vascular Surgery

## 2020-12-18 ENCOUNTER — Other Ambulatory Visit: Payer: Self-pay

## 2020-12-18 ENCOUNTER — Ambulatory Visit (INDEPENDENT_AMBULATORY_CARE_PROVIDER_SITE_OTHER): Payer: Medicare HMO | Admitting: Physician Assistant

## 2020-12-18 VITALS — BP 186/82 | HR 61 | Temp 98.0°F | Resp 20 | Ht 64.0 in | Wt 165.9 lb

## 2020-12-18 DIAGNOSIS — I6523 Occlusion and stenosis of bilateral carotid arteries: Secondary | ICD-10-CM

## 2020-12-18 NOTE — Progress Notes (Signed)
HISTORY AND PHYSICAL     CC:  follow up. Requesting Provider:  Kathyrn Drown, MD  HPI: This is a 75 y.o. female here for follow up for carotid artery stenosis.  Pt is s/p right CEA for symptomatic carotid artery stenosis in 2004 by Dr. Scot Dock.  She subsequently underwent right carotid stenting by Dr. Oneida Alar in 2017.    Prior to her CEA, she had a right brain stroke with left leg weakness that resolved.   Pt was last seen March 2021 and at that time she was asymptomatic.   Pt returns today for follow up.    Pt denies any amaurosis fugax, speech difficulties, weakness, numbness, paralysis or clumsiness or facial droop.  She continues to take her statin/asa and plavix daily.  She gets cramps in her legs at night and takes mustard for this and it helps.   She does not have any claudication, rest pain or non healing wounds.  Her blood pressure is elevated today but she did not take her medication this morning.  She has appt with PCP on the 30th.    The pt is on a statin for cholesterol management.  The pt is on a daily aspirin.   Other AC:  Plavix The pt is on CCB, diuretic for hypertension.   The pt is diabetic.   Tobacco hx:  never  Pt does not have family hx of AAA.  Past Medical History:  Diagnosis Date  . Allergy   . Arthritis    Left shoulder  . Carotid artery occlusion   . CVA (cerebral vascular accident) ([place]) 02-19-13  . DM (diabetes mellitus) ([place])   . Dyslipidemia   . GERD (gastroesophageal reflux disease)    intermittent Sx  . Hematuria   . Hip arthritis 2003   Left  . HTN (hypertension)   . Hyperlipidemia   . Renal insufficiency, mild   . Sleep related leg cramps     Past Surgical History:  Procedure Laterality Date  . CAROTID ENDARTERECTOMY  RIGHT  . COLONOSCOPY    . COLONOSCOPY  10/19/2011   Procedure: COLONOSCOPY;  Surgeon: Dorothyann Peng, MD;  [facility];  Service: Endoscopy;  Laterality: N/A;  10:15  . COLONOSCOPY N/A 08/16/2018    Procedure: COLONOSCOPY;  Surgeon: Danie Binder, MD;  Location: [facility];  Service: Endoscopy;  Laterality: N/A;  9:30am  . PERIPHERAL VASCULAR CATHETERIZATION Right 10/17/2015   Procedure: Carotid PTA/Stent Intervention;  Surgeon: Elam Dutch, MD;  Location: [place] [facility];  Service: Cardiovascular;  Laterality: Right;  . PERIPHERAL VASCULAR CATHETERIZATION Right 10/17/2015   Procedure: Carotid Angiography;  Surgeon: Elam Dutch, MD;  Location: [place] [facility];  Service: Cardiovascular;  Laterality: Right;  . S/P Hysterectomy    . TOTAL ABDOMINAL HYSTERECTOMY     FIBROID, HYPERMENORRHEA    Allergies  Allergen Reactions  . Other Hives    IV DYE   . Iodinated Diagnostic Agents Hives    Pre-medicate with benadryl and prednisone  . Lisinopril     Angioedema of tongue July 2017    Current Outpatient Medications  Medication Sig Dispense Refill  . acetaminophen (TYLENOL) 500 MG tablet Take 500-1,000 mg by mouth 2 (two) times daily as needed for mild pain.    Marland Kitchen amLODipine (NORVASC) 10 MG tablet TAKE 1 TABLET EVERY DAY 90 tablet 1  . amoxicillin (AMOXIL) 500 MG capsule 4 pills one hour before dental procedure (Patient not taking: Reported on 09/08/2020)  4 capsule 2  . aspirin 81 MG tablet Take 81 mg by mouth daily.    Marland Kitchen atorvastatin (LIPITOR) 80 MG tablet Take 1 tablet (80 mg total) by mouth daily at 6 PM. 90 tablet 1  . blood glucose meter kit and supplies Dispense based on patient and insurance preference. Use to test glucose once daily (FOR ICD-10 E11.9). 1 each 0  . clopidogrel (PLAVIX) 75 MG tablet Take 1 tablet (75 mg total) by mouth daily. 90 tablet 1  . doxazosin (CARDURA) 4 MG tablet TAKE 1 TABLET AT BEDTIME 90 tablet 1  . famotidine (PEPCID) 20 MG tablet Take 1 tablet (20 mg total) by mouth 2 (two) times daily. 60 tablet 0  . fluconazole (DIFLUCAN) 150 MG tablet TAKE ONE TABLET BY MOUTH AS A ONE-TIME DOSE 1 tablet 0  . fluticasone (FLONASE) 50 MCG/ACT nasal  spray USE 2 SPRAYS IN EACH NOSTRIL ONE TIME DAILY 48 g 1  . glucose blood (ONETOUCH ULTRA) test strip USE 1 STRIP TO CHECK GLUCOSE ONCE DAILY 50 each 2  . glucose blood (ONETOUCH ULTRA) test strip USE 1 STRIP TO CHECK GLUCOSE ONCE DAILY. DX: E11.9 50 each 11  . latanoprost (XALATAN) 0.005 % ophthalmic solution Place 1 drop into both eyes at bedtime.    . metFORMIN (GLUCOPHAGE) 500 MG tablet TAKE 2 TABLETS TWICE DAILY WITH MEALS 360 tablet 0  . Multiple Vitamin (MULTIVITAMIN) tablet Take 1 tablet by mouth daily.    . nitroGLYCERIN (NITROSTAT) 0.4 MG SL tablet DISSOLVE ONE TABLET UNDER THE TONGUE EVERY 5 MINUTES AS NEEDED FOR CHEST PAIN.  DO NOT EXCEED A TOTAL OF 3 DOSES IN 15 MINUTES (Patient taking differently: Place 0.4 mg under the tongue every 5 (five) minutes as needed for chest pain. DISSOLVE ONE TABLET UNDER THE TONGUE EVERY 5 MINUTES AS NEEDED FOR CHEST PAIN.  DO NOT EXCEED A TOTAL OF 3 DOSES IN 15 MINUTES) 25 tablet 0  . timolol (TIMOPTIC) 0.5 % ophthalmic solution Place 1 drop into both eyes daily.    Marland Kitchen triamterene-hydrochlorothiazide (MAXZIDE-25) 37.5-25 MG tablet TAKE 1 TABLET EVERY DAY 90 tablet 0  . VENTOLIN HFA 108 (90 Base) MCG/ACT inhaler Inhale 2 puffs into the lungs every 4 (four) hours as needed. 36 g 5   No current facility-administered medications for this visit.    Family History  Problem Relation Age of Onset  . Hypertension Mother   . Heart attack Mother   . Heart disease Mother   . Hypertension Sister   . Diabetes Sister        Amputation  . Hyperlipidemia Sister   . Heart attack Sister   . Diabetes Sister   . Hypertension Sister   . Heart disease Sister        Before age 15  . Heart disease Father   . Colon cancer Neg Hx   . Colon polyps Neg Hx     Social History   Socioeconomic History  . Marital status: Divorced    Spouse name: Not on file  . Number of children: Not on file  . Years of education: Not on file  . Highest education level: Not on file   Occupational History  . Not on file  Tobacco Use  . Smoking status: Never Smoker  . Smokeless tobacco: Never Used  Substance and Sexual Activity  . Alcohol use: No    Alcohol/week: 0.0 standard drinks  . Drug use: No  . Sexual activity: Not on file  Other Topics  Concern  . 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, BELONGS TO THE [employer])   Social Determinants of Health   Financial Resource Strain: Not on file  Food Insecurity: Not on file  Transportation Needs: Not on file  Physical Activity: Not on file  Stress: Not on file  Social Connections: Not on file  Intimate Partner Violence: Not on file     REVIEW OF SYSTEMS:   '[X]'  denotes positive finding, '[ ]'  denotes negative finding Cardiac  Comments:  Chest pain or chest pressure:    Shortness of breath upon exertion:    Short of breath when lying flat:    Irregular heart rhythm:        Vascular    Pain in calf, thigh, or hip brought on by ambulation:    Pain in feet at night that wakes you up from your sleep:     Blood clot in your veins:    Leg swelling:         Pulmonary    Oxygen at home:    Productive cough:     Wheezing:         Neurologic    Sudden weakness in arms or legs:     Sudden numbness in arms or legs:     Sudden onset of difficulty speaking or slurred speech:    Temporary loss of vision in one eye:     Problems with dizziness:         Gastrointestinal    Blood in stool:     Vomited blood:         Genitourinary    Burning when urinating:     Blood in urine:        Psychiatric    Major depression:         Hematologic    Bleeding problems:    Problems with blood clotting too easily:        Skin    Rashes or ulcers:        Constitutional    Fever or chills:      PHYSICAL EXAMINATION:  Today's Vitals   12/18/20 1048 12/18/20 1052  BP: (!) 190/86 (!) 186/82  Pulse: 61   Resp: 20   Temp: 98 F (36.7 C)   TempSrc: Temporal   SpO2:  100%   Weight: 165 lb 14.4 oz (75.3 kg)   Height: '5\' 4"'  (1.626 m)    Body mass index is 28.48 kg/m.   General:  WDWN in NAD; vital signs documented above Gait: Not observed HENT: WNL, normocephalic Pulmonary: normal non-labored breathing Cardiac: regular HR, without carotid bruits Abdomen: soft, NT; aortic pulse is not palpable Skin: without rashes Vascular Exam/Pulses:  Right Left  Radial 2+ (normal) 2+ (normal)  AT 1+ (weak) 2+ (normal)   Extremities: without ischemic changes, without Gangrene , without cellulitis; without open wounds Musculoskeletal: no muscle wasting or atrophy  Neurologic: A&O X 3; moving all extremities equally; speech is fluent/normal Psychiatric:  The pt has Normal affect.   Non-Invasive Vascular Imaging:   Carotid Duplex on 12/18/2020: Right:  Patent stent Left:  40-59% ICA stenosis Vertebrals: Bilateral vertebral arteries demonstrate antegrade flow.  Subclavians: Normal flow hemodynamics were seen in bilateral subclavian arteries.  Previous Carotid duplex on 12/12/2019: Right: patent stent Left:   40-59% ICA stenosis Vertebrals: Bilateral vertebral arteries demonstrate antegrade flow.  Subclavians: Normal flow hemodynamics were seen in bilateral subclavian arteries.  ASSESSMENT/PLAN:: 75 y.o. female here for follow up carotid artery stenosis and is s/p right CEA for symptomatic carotid artery stenosis in 2000 by Dr. Scot Dock and subsequently underwent right carotid stenting by Dr. Oneida Alar in 2017.  -duplex today reveals it is essentially unchanged and she remains asymptomatic.  BP is elevated today, but she has not taken her BP medication.  She will take this when she gets home. She has appt with PCP at the end of the month.  -discussed s/s of stroke with pt and she understands should she develop any of these sx, she will go to the nearest ER or call 911. -pt will f/u in one year with carotid duplex -pt will call sooner should they have any  issues. -continue statin/asa/plavix  Leontine Locket, [facility] [phone]  Clinic MD:  Oneida Alar

## 2020-12-25 ENCOUNTER — Encounter: Payer: Self-pay | Admitting: Nutrition

## 2020-12-25 ENCOUNTER — Other Ambulatory Visit: Payer: Self-pay

## 2020-12-25 ENCOUNTER — Encounter: Payer: Medicare HMO | Attending: Family Medicine | Admitting: Nutrition

## 2020-12-25 VITALS — Ht 64.0 in | Wt 166.0 lb

## 2020-12-25 DIAGNOSIS — E782 Mixed hyperlipidemia: Secondary | ICD-10-CM

## 2020-12-25 DIAGNOSIS — E1165 Type 2 diabetes mellitus with hyperglycemia: Secondary | ICD-10-CM | POA: Insufficient documentation

## 2020-12-25 DIAGNOSIS — E118 Type 2 diabetes mellitus with unspecified complications: Secondary | ICD-10-CM | POA: Insufficient documentation

## 2020-12-25 DIAGNOSIS — I10 Essential (primary) hypertension: Secondary | ICD-10-CM | POA: Diagnosis not present

## 2020-12-25 DIAGNOSIS — IMO0002 Reserved for concepts with insufficient information to code with codable children: Secondary | ICD-10-CM

## 2020-12-25 NOTE — Progress Notes (Signed)
12-25-20 Medical Nutrition Therapy:  Appt start time: 1100 end time:  1130  Assessment:  Primary concerns today: Diabetes Type 2. PCP: Dr. Wolfgang Phoenix   Will see him the end of the month Wants to start walking again soon. A1C 6.7%. Lost 2 lbs since October 2021. FBS 120-150's  Using air fryer. Metformin 1000 mg BID. Marland Kitchen Last A1C 6.7%. Doing well.  Wt Readings from Last 3 Encounters:  12/18/20 165 lb 14.4 oz (75.3 kg)  09/08/20 167 lb (75.8 kg)  07/24/20 168 lb 12.8 oz (76.6 kg)   Ht Readings from Last 3 Encounters:  12/18/20 5\' 4"  (1.626 m)  09/08/20 5\' 4"  (1.626 m)  07/24/20 5\' 4"  (1.626 m)   There is no height or weight on file to calculate BMI. @BMIFA @ Facility age limit for growth percentiles is 20 years. Facility age limit for growth percentiles is 20 years.   Lab Results  Component Value Date   HGBA1C 6.7 (H) 12/01/2020   CMP Latest Ref Rng & Units 12/01/2020 09/01/2020 05/19/2020  Glucose 65 - 99 mg/dL 164(H) 160(H) 140(H)  BUN 8 - 27 mg/dL 18 14 15   Creatinine 0.57 - 1.00 mg/dL 1.00 0.92 0.88  Sodium 134 - 144 mmol/L 143 140 140  Potassium 3.5 - 5.2 mmol/L 3.8 3.6 3.7  Chloride 96 - 106 mmol/L 103 100 100  CO2 20 - 29 mmol/L 24 25 25   Calcium 8.7 - 10.3 mg/dL 9.3 9.5 9.9  Total Protein 6.0 - 8.5 g/dL - - -  Total Bilirubin 0.0 - 1.2 mg/dL - - -  Alkaline Phos 39 - 117 IU/L - - -  AST 0 - 40 IU/L - - -  ALT 0 - 32 IU/L - - -     Preferred Learning Style:    No preference indicated   Learning Readiness:     Ready  Change in progress   MEDICATIONS: see list   DIETARY INTAKE:   24-hr recall:  B) 1/2 coffee, oatmeal and fruit L) Pintos and slaw  D) Beverages: water,   Usual physical activity: walks,  Estimated energy needs: 1500  calories 170 g carbohydrates 112 g protein 42g fat  Progress Towards Goal(s):  In progress.Goals Nutritional Diagnosis:  NB-1.1 Food and nutrition-related knowledge deficit As related to Diabetes Type 2.  As  evidenced by A1C .    Intervention:  Nutrition and Diabetes education provided on My Plate, CHO counting, meal planning, portion sizes, timing of meals, avoiding snacks between meals unless having a low blood sugar, target ranges for A1C and blood sugars, signs/symptoms and treatment of hyper/hypoglycemia, monitoring blood sugars, taking medications as prescribed, benefits of exercising 30 minutes per day and prevention of complications of DM. Marland Kitchen Goal Keep up the great job! Don't drink [place] delight  No grapefruit May do sweet potato Increase walking to 30 minutes a day    Teaching Method Utilized:   Auditory  Handouts given during visit include:  Verbally went over Plate Method, CHO counting and meal planning   Barriers to learning/adherence to lifestyle change: none  Demonstrated degree of understanding via:  Teach Back   Monitoring/Evaluation:  Dietary intake, exercise, , and body weight in 3 month(s).

## 2020-12-25 NOTE — Patient Instructions (Signed)
Keep up the great job! Don't drink [place] delight  No grapefruit May do sweet potato Increase walking to 30 minutes a day

## 2020-12-31 DIAGNOSIS — H401111 Primary open-angle glaucoma, right eye, mild stage: Secondary | ICD-10-CM | POA: Diagnosis not present

## 2020-12-31 DIAGNOSIS — H401123 Primary open-angle glaucoma, left eye, severe stage: Secondary | ICD-10-CM | POA: Diagnosis not present

## 2021-01-05 ENCOUNTER — Encounter: Payer: Self-pay | Admitting: Nutrition

## 2021-01-05 ENCOUNTER — Ambulatory Visit: Payer: Medicare HMO | Admitting: Family Medicine

## 2021-01-07 ENCOUNTER — Ambulatory Visit (INDEPENDENT_AMBULATORY_CARE_PROVIDER_SITE_OTHER): Payer: Medicare HMO | Admitting: Family Medicine

## 2021-01-07 ENCOUNTER — Other Ambulatory Visit: Payer: Self-pay

## 2021-01-07 ENCOUNTER — Encounter: Payer: Self-pay | Admitting: Family Medicine

## 2021-01-07 VITALS — BP 132/72 | HR 51 | Temp 97.0°F | Ht 64.0 in | Wt 167.0 lb

## 2021-01-07 DIAGNOSIS — I700 Atherosclerosis of aorta: Secondary | ICD-10-CM | POA: Diagnosis not present

## 2021-01-07 DIAGNOSIS — E785 Hyperlipidemia, unspecified: Secondary | ICD-10-CM

## 2021-01-07 DIAGNOSIS — J683 Other acute and subacute respiratory conditions due to chemicals, gases, fumes and vapors: Secondary | ICD-10-CM

## 2021-01-07 DIAGNOSIS — Z23 Encounter for immunization: Secondary | ICD-10-CM

## 2021-01-07 DIAGNOSIS — E1159 Type 2 diabetes mellitus with other circulatory complications: Secondary | ICD-10-CM

## 2021-01-07 DIAGNOSIS — R790 Abnormal level of blood mineral: Secondary | ICD-10-CM | POA: Diagnosis not present

## 2021-01-07 DIAGNOSIS — I10 Essential (primary) hypertension: Secondary | ICD-10-CM | POA: Diagnosis not present

## 2021-01-07 DIAGNOSIS — W57XXXA Bitten or stung by nonvenomous insect and other nonvenomous arthropods, initial encounter: Secondary | ICD-10-CM

## 2021-01-07 MED ORDER — DOXYCYCLINE HYCLATE 100 MG PO TABS: 100.0000 mg | ORAL_TABLET | Freq: Two times a day (BID) | ORAL | 0 refills | Status: DC

## 2021-01-07 NOTE — Progress Notes (Signed)
Subjective:    Patient ID: Kara Kirby, female    DOB: [birth year], 75 y.o.   MRN: [id number]  HPI  Hyperlipidemia, unspecified hyperlipidemia type - Plan: Lipid panel  Type 2 diabetes mellitus with other circulatory complication, without long-term current use of insulin (HCC) - Plan: Hemoglobin A1c, Microalbumin / creatinine urine ratio  Hypertension, unspecified type - Plan: Comprehensive metabolic panel  Low magnesium level - Plan: Magnesium  Need for vaccination - Plan: Tdap vaccine greater than or equal to 7yo IM  Tick bite, unspecified site, initial encounter - Plan: Tdap vaccine greater than or equal to 7yo IM  Aortic atherosclerosis (HCC), Chronic  Reactive airways dysfunction syndrome, mild intermittent, uncomplicated (HCC), Chronic  Aortic atherosclerosis seen on previous scan She is trying to do a good job keeping her sugars under control states her diet overall doing fairly well States she has a lump underneath her left arm that she thought was a skin tag or something but she states it started bleeding she would like to have it checked Denies any fever chills Recent lab work showed low magnesium States her breathing overall is doing fairly well denies any flareups Saw vascular for her carotid disease and they follow her on a regular basis   Review of Systems  Constitutional: Negative for activity change, fatigue and fever.  HENT: Negative for congestion and rhinorrhea.   Respiratory: Negative for cough, chest tightness and shortness of breath.   Cardiovascular: Negative for chest pain and leg swelling.  Gastrointestinal: Negative for abdominal pain and nausea.  Skin: Negative for color change.  Neurological: Negative for dizziness and headaches.  Psychiatric/Behavioral: Negative for agitation and behavioral problems.       Objective:   Physical Exam Vitals reviewed.  Constitutional:      General: She is not in acute distress. HENT:     Head:  Normocephalic.  Cardiovascular:     Rate and Rhythm: Normal rate and regular rhythm.     Heart sounds: Normal heart sounds. No murmur heard.   Pulmonary:     Effort: Pulmonary effort is normal.     Breath sounds: Normal breath sounds.  Lymphadenopathy:     Cervical: No cervical adenopathy.  Neurological:     Mental Status: She is alert.  Psychiatric:        Behavior: Behavior normal.    Knot under arm left side-questionable black head       Assessment & Plan:  1. Hyperlipidemia, unspecified hyperlipidemia type Lipid profile ordered.  Continue medication.  Check labs before next visit.  Watch diet - Lipid panel  2. Type 2 diabetes mellitus with other circulatory complication, without long-term current use of insulin (HCC) A1c respectable watch diet stay active portion control continue medications - Hemoglobin A1c - Microalbumin / creatinine urine ratio  3. Hypertension, unspecified type Blood pressure very good control continue current meds check labs before next visit - Comprehensive metabolic panel  4. Low magnesium level Magnesium low now taken 400 mg twice daily repeat this again before next visit - Magnesium  5. Need for vaccination Tetanus shot because of tick bite in localized bleeding she had - Tdap vaccine greater than or equal to 7yo IM  6. Tick bite, unspecified site, initial encounter Doxycycline 100 mg twice daily for 7 days with snack in a glass of water - Tdap vaccine greater than or equal to 7yo IM If high fevers progressive troubles or other issues would need to follow-up ASAP 7. Aortic atherosclerosis ([place])  Cholesterol medicine regular basis healthy eating regular physical activity this was seen on previous CAT scan  8. Reactive airways dysfunction syndrome, mild intermittent, uncomplicated (HCC) Has intermittent wheezing issues none today intermittent reactive airway uses albuterol no troubles currently

## 2021-02-11 ENCOUNTER — Other Ambulatory Visit: Payer: Self-pay | Admitting: Family Medicine

## 2021-02-23 DIAGNOSIS — H5203 Hypermetropia, bilateral: Secondary | ICD-10-CM | POA: Diagnosis not present

## 2021-02-23 DIAGNOSIS — H52203 Unspecified astigmatism, bilateral: Secondary | ICD-10-CM | POA: Diagnosis not present

## 2021-02-23 DIAGNOSIS — H524 Presbyopia: Secondary | ICD-10-CM | POA: Diagnosis not present

## 2021-03-18 ENCOUNTER — Other Ambulatory Visit: Payer: Self-pay | Admitting: Family Medicine

## 2021-04-02 DIAGNOSIS — H401123 Primary open-angle glaucoma, left eye, severe stage: Secondary | ICD-10-CM | POA: Diagnosis not present

## 2021-04-02 DIAGNOSIS — H401111 Primary open-angle glaucoma, right eye, mild stage: Secondary | ICD-10-CM | POA: Diagnosis not present

## 2021-04-06 DIAGNOSIS — J310 Chronic rhinitis: Secondary | ICD-10-CM | POA: Diagnosis not present

## 2021-04-06 DIAGNOSIS — H6983 Other specified disorders of Eustachian tube, bilateral: Secondary | ICD-10-CM | POA: Diagnosis not present

## 2021-04-06 DIAGNOSIS — J343 Hypertrophy of nasal turbinates: Secondary | ICD-10-CM | POA: Diagnosis not present

## 2021-04-06 DIAGNOSIS — H6121 Impacted cerumen, right ear: Secondary | ICD-10-CM | POA: Diagnosis not present

## 2021-04-09 DIAGNOSIS — E113299 Type 2 diabetes mellitus with mild nonproliferative diabetic retinopathy without macular edema, unspecified eye: Secondary | ICD-10-CM | POA: Diagnosis not present

## 2021-04-09 DIAGNOSIS — H34832 Tributary (branch) retinal vein occlusion, left eye, with macular edema: Secondary | ICD-10-CM | POA: Diagnosis not present

## 2021-04-09 DIAGNOSIS — H3589 Other specified retinal disorders: Secondary | ICD-10-CM | POA: Diagnosis not present

## 2021-04-09 DIAGNOSIS — E113293 Type 2 diabetes mellitus with mild nonproliferative diabetic retinopathy without macular edema, bilateral: Secondary | ICD-10-CM | POA: Diagnosis not present

## 2021-04-09 DIAGNOSIS — H25813 Combined forms of age-related cataract, bilateral: Secondary | ICD-10-CM | POA: Diagnosis not present

## 2021-04-27 DIAGNOSIS — I10 Essential (primary) hypertension: Secondary | ICD-10-CM | POA: Diagnosis not present

## 2021-04-27 DIAGNOSIS — R790 Abnormal level of blood mineral: Secondary | ICD-10-CM | POA: Diagnosis not present

## 2021-04-27 DIAGNOSIS — E785 Hyperlipidemia, unspecified: Secondary | ICD-10-CM | POA: Diagnosis not present

## 2021-04-27 DIAGNOSIS — E1159 Type 2 diabetes mellitus with other circulatory complications: Secondary | ICD-10-CM | POA: Diagnosis not present

## 2021-04-28 LAB — MICROALBUMIN / CREATININE URINE RATIO
Creatinine, Urine: 99 mg/dL
Microalb/Creat Ratio: 164 mg/g creat — ABNORMAL HIGH (ref 0–29)
Microalbumin, Urine: 162.5 ug/mL

## 2021-04-28 LAB — COMPREHENSIVE METABOLIC PANEL
ALT: 19 IU/L (ref 0–32)
AST: 14 IU/L (ref 0–40)
Albumin/Globulin Ratio: 2 (ref 1.2–2.2)
Albumin: 4.3 g/dL (ref 3.7–4.7)
Alkaline Phosphatase: 61 IU/L (ref 44–121)
BUN/Creatinine Ratio: 22 (ref 12–28)
BUN: 17 mg/dL (ref 8–27)
Bilirubin Total: 0.7 mg/dL (ref 0.0–1.2)
CO2: 25 mmol/L (ref 20–29)
Calcium: 9.3 mg/dL (ref 8.7–10.3)
Chloride: 102 mmol/L (ref 96–106)
Creatinine, Ser: 0.79 mg/dL (ref 0.57–1.00)
Globulin, Total: 2.1 g/dL (ref 1.5–4.5)
Glucose: 128 mg/dL — ABNORMAL HIGH (ref 65–99)
Potassium: 3.7 mmol/L (ref 3.5–5.2)
Sodium: 142 mmol/L (ref 134–144)
Total Protein: 6.4 g/dL (ref 6.0–8.5)
eGFR: 77 mL/min/{1.73_m2} (ref >59)

## 2021-04-28 LAB — LIPID PANEL
Chol/HDL Ratio: 3.3 ratio (ref 0.0–4.4)
Cholesterol, Total: 139 mg/dL (ref 100–199)
HDL: 42 mg/dL (ref >39)
LDL Chol Calc (NIH): 78 mg/dL (ref 0–99)
Triglycerides: 105 mg/dL (ref 0–149)
VLDL Cholesterol Cal: 19 mg/dL (ref 5–40)

## 2021-04-28 LAB — HEMOGLOBIN A1C
Est. average glucose Bld gHb Est-mCnc: 151 mg/dL
Hgb A1c MFr Bld: 6.9 % — ABNORMAL HIGH (ref 4.8–5.6)

## 2021-04-28 LAB — MAGNESIUM: Magnesium: 1.9 mg/dL (ref 1.6–2.3)

## 2021-05-04 ENCOUNTER — Other Ambulatory Visit: Payer: Self-pay | Admitting: Family Medicine

## 2021-05-04 DIAGNOSIS — I70219 Atherosclerosis of native arteries of extremities with intermittent claudication, unspecified extremity: Secondary | ICD-10-CM

## 2021-05-04 DIAGNOSIS — I6523 Occlusion and stenosis of bilateral carotid arteries: Secondary | ICD-10-CM

## 2021-05-04 DIAGNOSIS — I700 Atherosclerosis of aorta: Secondary | ICD-10-CM

## 2021-05-04 DIAGNOSIS — I10 Essential (primary) hypertension: Secondary | ICD-10-CM

## 2021-05-08 ENCOUNTER — Other Ambulatory Visit: Payer: Self-pay

## 2021-05-08 ENCOUNTER — Ambulatory Visit (INDEPENDENT_AMBULATORY_CARE_PROVIDER_SITE_OTHER): Payer: Medicare HMO | Admitting: Family Medicine

## 2021-05-08 ENCOUNTER — Encounter: Payer: Self-pay | Admitting: Family Medicine

## 2021-05-08 VITALS — BP 128/62 | Temp 97.2°F | Wt 163.2 lb

## 2021-05-08 DIAGNOSIS — W57XXXD Bitten or stung by nonvenomous insect and other nonvenomous arthropods, subsequent encounter: Secondary | ICD-10-CM | POA: Diagnosis not present

## 2021-05-08 DIAGNOSIS — E1159 Type 2 diabetes mellitus with other circulatory complications: Secondary | ICD-10-CM

## 2021-05-08 DIAGNOSIS — R1013 Epigastric pain: Secondary | ICD-10-CM

## 2021-05-08 DIAGNOSIS — E1169 Type 2 diabetes mellitus with other specified complication: Secondary | ICD-10-CM

## 2021-05-08 DIAGNOSIS — I10 Essential (primary) hypertension: Secondary | ICD-10-CM

## 2021-05-08 DIAGNOSIS — R0981 Nasal congestion: Secondary | ICD-10-CM | POA: Diagnosis not present

## 2021-05-08 DIAGNOSIS — R21 Rash and other nonspecific skin eruption: Secondary | ICD-10-CM | POA: Diagnosis not present

## 2021-05-08 DIAGNOSIS — E785 Hyperlipidemia, unspecified: Secondary | ICD-10-CM | POA: Diagnosis not present

## 2021-05-08 MED ORDER — TRIAMTERENE-HCTZ 37.5-25 MG PO TABS: 1.0000 | ORAL_TABLET | Freq: Every day | ORAL | 1 refills | Status: DC

## 2021-05-08 MED ORDER — METFORMIN HCL 500 MG PO TABS: ORAL_TABLET | ORAL | 1 refills | Status: DC

## 2021-05-08 MED ORDER — PANTOPRAZOLE SODIUM 40 MG PO TBEC: 40.0000 mg | DELAYED_RELEASE_TABLET | Freq: Every day | ORAL | 1 refills | Status: DC

## 2021-05-08 NOTE — Patient Instructions (Addendum)
Results for orders placed or performed in visit on 01/07/21  Lipid panel  Result Value Ref Range   Cholesterol, Total 139 100 - 199 mg/dL   Triglycerides 105 0 - 149 mg/dL   HDL 42 >39 mg/dL   VLDL Cholesterol Cal 19 5 - 40 mg/dL   LDL Chol Calc (NIH) 78 0 - 99 mg/dL   Chol/HDL Ratio 3.3 0.0 - 4.4 ratio  Hemoglobin A1c  Result Value Ref Range   Hgb A1c MFr Bld 6.9 (H) 4.8 - 5.6 %   Est. average glucose Bld gHb Est-mCnc 151 mg/dL  Microalbumin / creatinine urine ratio  Result Value Ref Range   Creatinine, Urine 99.0 Not Estab. mg/dL   Microalbumin, Urine 162.5 Not Estab. ug/mL   Microalb/Creat Ratio 164 (H) 0 - 29 mg/g creat  Comprehensive metabolic panel  Result Value Ref Range   Glucose 128 (H) 65 - 99 mg/dL   BUN 17 8 - 27 mg/dL   Creatinine, Ser 0.79 0.57 - 1.00 mg/dL   eGFR 77 >59 mL/min/1.73   BUN/Creatinine Ratio 22 12 - 28   Sodium 142 134 - 144 mmol/L   Potassium 3.7 3.5 - 5.2 mmol/L   Chloride 102 96 - 106 mmol/L   CO2 25 20 - 29 mmol/L   Calcium 9.3 8.7 - 10.3 mg/dL   Total Protein 6.4 6.0 - 8.5 g/dL   Albumin 4.3 3.7 - 4.7 g/dL   Globulin, Total 2.1 1.5 - 4.5 g/dL   Albumin/Globulin Ratio 2.0 1.2 - 2.2   Bilirubin Total 0.7 0.0 - 1.2 mg/dL   Alkaline Phosphatase 61 44 - 121 IU/L   AST 14 0 - 40 IU/L   ALT 19 0 - 32 IU/L  Magnesium  Result Value Ref Range   Magnesium 1.9 1.6 - 2.3 mg/dL   Food Choices for Gastroesophageal Reflux Disease, Adult When you have gastroesophageal reflux disease (GERD), the foods you eat and your eating habits are very important. Choosing the right foods can help ease your discomfort. Think about working with a food expert (dietitian) to help you make good choices. What are tips for following this plan? Reading food labels Look for foods that are low in saturated fat. Foods that may help with your symptoms include: Foods that have less than 5% of daily value (DV) of fat. Foods that have 0 grams of trans fat. Cooking Do not fry  your food. Cook your food by baking, steaming, grilling, or broiling. These are all methods that do not need a lot of fat for cooking. To add flavor, try to use herbs that are low in spice and acidity. Meal planning  Choose healthy foods that are low in fat, such as: Fruits and vegetables. Whole grains. Low-fat dairy products. Lean meats, fish, and poultry. Eat small meals often instead of eating 3 large meals each day. Eat your meals slowly in a place where you are relaxed. Avoid bending over or lying down until 2-3 hours after eating. Limit high-fat foods such as fatty meats or fried foods. Limit your intake of fatty foods, such as oils, butter, and shortening. Avoid the following as told by your doctor: Foods that cause symptoms. These may be different for different people. Keep a food diary to keep track of foods that cause symptoms. Alcohol. Drinking a lot of liquid with meals. Eating meals during the 2-3 hours before bed.  Lifestyle Stay at a healthy weight. Ask your doctor what weight is healthy for you. If you need  to lose weight, work with your doctor to do so safely. Exercise for at least 30 minutes on 5 or more days each week, or as told by your doctor. Wear loose-fitting clothes. Do not smoke or use any products that contain nicotine or tobacco. If you need help quitting, ask your doctor. Sleep with the head of your bed higher than your feet. Use a wedge under the mattress or blocks under the bed frame to raise the head of the bed. Chew sugar-free gum after meals. What foods should eat?  Eat a healthy, well-balanced diet of fruits, vegetables, whole grains, low-fatdairy products, lean meats, fish, and poultry. Each person is different. Foods that may cause symptoms in one person may not cause any symptoms inanother person. Work with your doctor to find foods that are safe for you. The items listed above may not be a complete list of what you can eat and drink. Contact a food  expert for more options. What foods should I avoid? Limiting some of these foods may help in managing the symptoms of GERD. Everyone is different. Talk with a food expert or your doctor to help you findthe exact foods to avoid, if any. Fruits Any fruits prepared with added fat. Any fruits that cause symptoms. For some people, this may include citrus fruits, such as oranges, grapefruit, pineapple,and lemons. Vegetables Deep-fried vegetables. [place] fries. Any vegetables prepared with added fat. Any vegetables that cause symptoms. For some people, this may include tomatoesand tomato products, chili peppers, onions and garlic, and horseradish. Grains Pastries or quick breads with added fat. Meats and other proteins High-fat meats, such as fatty beef or pork, hot dogs, ribs, ham, sausage, salami, and bacon. Fried meat or protein, including fried fish and friedchicken. Nuts and nut butters, in large amounts. Dairy Whole milk and chocolate milk. Sour cream. Cream. Ice cream. Cream cheese.Milkshakes. Fats and oils Butter. Margarine. Shortening. Ghee. Beverages Coffee and tea, with or without caffeine. Carbonated beverages. Sodas. Energy drinks. Fruit juice made with acidic fruits, such as orange or grapefruit.Tomato juice. Alcoholic drinks. Sweets and desserts Chocolate and cocoa. Donuts. Seasonings and condiments Pepper. Peppermint and spearmint. Added salt. Any condiments, herbs, or seasonings that cause symptoms. For some people, this may include curry, hotsauce, or vinegar-based salad dressings. The items listed above may not be a complete list of what you should not eat and drink. Contact a food expert for more options. Questions to ask your doctor Diet and lifestyle changes are often the first steps that are taken to manage symptoms of GERD. If diet and lifestyle changes do not help, talk with yourdoctor about taking medicines. Where to find more information International Foundation for  Gastrointestinal Disorders: aboutgerd.org Summary When you have GERD, food and lifestyle choices are very important in easing your symptoms. Eat small meals often instead of 3 large meals a day. Eat your meals slowly and in a place where you are relaxed. Avoid bending over or lying down until 2-3 hours after eating. Limit high-fat foods such as fatty meats or fried foods. This information is not intended to replace advice given to you by your health care provider. Make sure you discuss any questions you have with your healthcare provider. Document Revised: 04/07/2020 Document Reviewed: 04/07/2020 Elsevier Patient Education  [place].

## 2021-05-08 NOTE — Progress Notes (Signed)
   Subjective:    Patient ID: Kara Kirby, female    DOB: 1946-06-28, 75 y.o.   MRN: [id number]  HPI Pt here for 4 month follow up. Pt checking sugars at home and last sugar check was 144. Pt has had headaches (not sure why sinus issues-going on all through summer)  Tick bite-this occurred underneath the left arm she is concerned that it still has a.  There this happened several weeks back.  There has been no fevers chills or other symptoms associated with it  Rash on shoulders intermittent bumps that are on her upper chest and her upper back seemingly over the past few weeks these have come on  Sinus sx moderate sinus symptoms of pressure soreness will be seen ENT has a whitish discharge  Headaches in the morning sinus headaches in the morning mainly in the frontal headache region do not wake her up at night do not cause any type of secondary illness no vomiting no blurred vision or double vision  Water to drink she wonders how much water to drink she drinks 4 or 5 8 ounce bottles a day that seems to be fine  Check fungus on the feet she is concerned about this on her toes  Ear doc- has appt in august for them has appointment with ENT in August  Bill for 2 years she states that she is frustrated by the cost of a bill we will send this by office manager  Stomach pains epigastric intermittent epigastric pain.  No reflux symptoms.  No vomiting or diarrhea.    Review of Systems     Objective:   Physical Exam General-in no acute distress Eyes-no discharge Lungs-respiratory rate normal, CTA CV-no murmurs,RRR Extremities skin warm dry no edema Neuro grossly normal Behavior normal, alert Nondistended rash noted on the upper chest of some small what appears to be moles developing.       Assessment & Plan:  1. Hypertension, unspecified type Blood pressure decent control continue current measures watch diet stay active - Hemoglobin 123456 - Basic Metabolic Panel (BMET)  2.  Type 2 diabetes mellitus with other circulatory complication, without long-term current use of insulin ([place]) Diabetes decent control check labs before next visit regular healthy diet recommended - Hemoglobin 123456 - Basic Metabolic Panel (BMET)  3. Hyperlipidemia associated with type 2 diabetes mellitus ([place]) Continue statin watch diet check labs before next visit previous labs reviewed - Hemoglobin 123456 - Basic Metabolic Panel (BMET)  4. Rash Nonspecific rash recommend dermatology patient defers could be small benign moles  5. Tick bite, unspecified site, subsequent encounter No sign of active infection this happened a while back is just left a residual mark  6. Epigastric pain Mild epigastric pain recommend PPI for the next 1 to 3 months if ongoing troubles or progressive troubles or dysphagia or vomiting recommend GI  7. Sinus congestion Mild sinus congestion no active infection patient already has appointment to see ENT  Follow-up 3 to 4 months

## 2021-05-20 DIAGNOSIS — J342 Deviated nasal septum: Secondary | ICD-10-CM | POA: Diagnosis not present

## 2021-05-20 DIAGNOSIS — J310 Chronic rhinitis: Secondary | ICD-10-CM | POA: Diagnosis not present

## 2021-05-20 DIAGNOSIS — J343 Hypertrophy of nasal turbinates: Secondary | ICD-10-CM | POA: Diagnosis not present

## 2021-05-20 DIAGNOSIS — R519 Headache, unspecified: Secondary | ICD-10-CM | POA: Diagnosis not present

## 2021-07-01 ENCOUNTER — Other Ambulatory Visit: Payer: Self-pay

## 2021-07-01 ENCOUNTER — Encounter: Payer: Self-pay | Admitting: Nutrition

## 2021-07-01 ENCOUNTER — Encounter: Payer: Medicare HMO | Attending: Family Medicine | Admitting: Nutrition

## 2021-07-01 VITALS — Ht 64.0 in | Wt 165.0 lb

## 2021-07-01 DIAGNOSIS — I10 Essential (primary) hypertension: Secondary | ICD-10-CM | POA: Insufficient documentation

## 2021-07-01 DIAGNOSIS — E118 Type 2 diabetes mellitus with unspecified complications: Secondary | ICD-10-CM | POA: Insufficient documentation

## 2021-07-01 DIAGNOSIS — E1165 Type 2 diabetes mellitus with hyperglycemia: Secondary | ICD-10-CM | POA: Insufficient documentation

## 2021-07-01 DIAGNOSIS — E782 Mixed hyperlipidemia: Secondary | ICD-10-CM | POA: Insufficient documentation

## 2021-07-01 DIAGNOSIS — IMO0002 Reserved for concepts with insufficient information to code with codable children: Secondary | ICD-10-CM

## 2021-07-01 NOTE — Patient Instructions (Signed)
Goal Keep up the great job! May do sweet potato Increase plant based foods Increase walking to 30 minutes a day

## 2021-07-01 NOTE — Progress Notes (Signed)
  Medical Nutrition Therapy:  Appt start time: 4008 end time:  1330 Assessment:  Primary concerns today: Diabetes Type 2. PCP: Dr. Wolfgang Phoenix     A1C 6.9% today. NO changes in medications. Saw Rayetta Pigg, FNP today. Metformin 1000 mg BID FBS 130's. Mostly checks it in am only. Mowing yard and walks some.  Wt Readings from Last 3 Encounters:  05/08/21 163 lb 3.2 oz (74 kg)  01/07/21 167 lb (75.8 kg)  12/25/20 166 lb (75.3 kg)   Ht Readings from Last 3 Encounters:  01/07/21 5\' 4"  (1.626 m)  12/25/20 5\' 4"  (1.626 m)  12/18/20 5\' 4"  (1.626 m)   There is no height or weight on file to calculate BMI. @BMIFA @ Facility age limit for growth percentiles is 20 years. Facility age limit for growth percentiles is 20 years.   Lab Results  Component Value Date   HGBA1C 6.9 (H) 04/27/2021   CMP Latest Ref Rng & Units 04/27/2021 12/01/2020 09/01/2020  Glucose 65 - 99 mg/dL 128(H) 164(H) 160(H)  BUN 8 - 27 mg/dL 17 18 14   Creatinine 0.57 - 1.00 mg/dL 0.79 1.00 0.92  Sodium 134 - 144 mmol/L 142 143 140  Potassium 3.5 - 5.2 mmol/L 3.7 3.8 3.6  Chloride 96 - 106 mmol/L 102 103 100  CO2 20 - 29 mmol/L 25 24 25   Calcium 8.7 - 10.3 mg/dL 9.3 9.3 9.5  Total Protein 6.0 - 8.5 g/dL 6.4 - -  Total Bilirubin 0.0 - 1.2 mg/dL 0.7 - -  Alkaline Phos 44 - 121 IU/L 61 - -  AST 0 - 40 IU/L 14 - -  ALT 0 - 32 IU/L 19 - -     Preferred Learning Style:  No preference indicated   Learning Readiness:   Ready Change in progress   MEDICATIONS: see list   DIETARY INTAKE:   24-hr recall:  B) 1/2 coffee, oatmeal and fruit L) Chicken, salad, water, sprite sf  D) Meat and vegetables, water  Usual physical activity: walks,  Estimated energy needs: 1500  calories 170 g carbohydrates 112 g protein 42g fat  Progress Towards Goal(s):  In progress.Goals Nutritional Diagnosis:  NB-1.1 Food and nutrition-related knowledge deficit As related to Diabetes Type 2.  As evidenced by A1C .     Intervention:  Nutrition and Diabetes education provided on My Plate, CHO counting, meal planning, portion sizes, timing of meals, avoiding snacks between meals unless having a low blood sugar, target ranges for A1C and blood sugars, signs/symptoms and treatment of hyper/hypoglycemia, monitoring blood sugars, taking medications as prescribed, benefits of exercising 30 minutes per day and prevention of complications of DM. [place] Kitchen Goal Keep up the great job! May do sweet potato Increase plant based foods Increase walking to 30 minutes a day    Teaching Method Utilized:   Auditory  Handouts given during visit include: Verbally went over Plate Method, CHO counting and meal planning   Barriers to learning/adherence to lifestyle change: none  Demonstrated degree of understanding via:  Teach Back   Monitoring/Evaluation:  Dietary intake, exercise, , and body weight in 3 month(s).

## 2021-07-03 ENCOUNTER — Telehealth: Payer: Self-pay | Admitting: Family Medicine

## 2021-07-03 NOTE — Telephone Encounter (Signed)
  Left message for patient to call back and schedule Medicare Annual Wellness Visit (AWV) in office.   If unable to come into the office for AWV,  please offer to do virtually or by telephone.  No hx of AWV eligible for AWVI as of 04/11/2011  Please schedule at anytime with [facility] Advisor.      40 Minutes appointment   Any questions, please call me at [phone]

## 2021-07-28 ENCOUNTER — Other Ambulatory Visit: Payer: Self-pay | Admitting: Family Medicine

## 2021-07-31 ENCOUNTER — Other Ambulatory Visit: Payer: Self-pay | Admitting: Nurse Practitioner

## 2021-07-31 ENCOUNTER — Other Ambulatory Visit: Payer: Self-pay | Admitting: Family Medicine

## 2021-07-31 ENCOUNTER — Telehealth: Payer: Self-pay | Admitting: Nurse Practitioner

## 2021-07-31 MED ORDER — AMOXICILLIN 500 MG PO CAPS: ORAL_CAPSULE | ORAL | 2 refills | Status: DC

## 2021-07-31 NOTE — Telephone Encounter (Signed)
Kara Kirby requesting refill for Amoxicillin 500 mg capsules. Pt states she uses this before dental procedures; pt is going to have teeth cleaned. Please advise. Thank you.

## 2021-07-31 NOTE — Telephone Encounter (Signed)
Done

## 2021-08-20 ENCOUNTER — Other Ambulatory Visit (HOSPITAL_COMMUNITY): Payer: Self-pay | Admitting: Family Medicine

## 2021-08-20 DIAGNOSIS — Z1231 Encounter for screening mammogram for malignant neoplasm of breast: Secondary | ICD-10-CM

## 2021-08-20 DIAGNOSIS — H34832 Tributary (branch) retinal vein occlusion, left eye, with macular edema: Secondary | ICD-10-CM | POA: Diagnosis not present

## 2021-08-20 DIAGNOSIS — E113293 Type 2 diabetes mellitus with mild nonproliferative diabetic retinopathy without macular edema, bilateral: Secondary | ICD-10-CM | POA: Diagnosis not present

## 2021-08-31 DIAGNOSIS — I10 Essential (primary) hypertension: Secondary | ICD-10-CM | POA: Diagnosis not present

## 2021-08-31 DIAGNOSIS — E785 Hyperlipidemia, unspecified: Secondary | ICD-10-CM | POA: Diagnosis not present

## 2021-08-31 DIAGNOSIS — E1159 Type 2 diabetes mellitus with other circulatory complications: Secondary | ICD-10-CM | POA: Diagnosis not present

## 2021-08-31 DIAGNOSIS — E1169 Type 2 diabetes mellitus with other specified complication: Secondary | ICD-10-CM | POA: Diagnosis not present

## 2021-09-01 LAB — BASIC METABOLIC PANEL
BUN/Creatinine Ratio: 27 (ref 12–28)
BUN: 21 mg/dL (ref 8–27)
CO2: 24 mmol/L (ref 20–29)
Calcium: 9.1 mg/dL (ref 8.7–10.3)
Chloride: 103 mmol/L (ref 96–106)
Creatinine, Ser: 0.79 mg/dL (ref 0.57–1.00)
Glucose: 156 mg/dL — ABNORMAL HIGH (ref 70–99)
Potassium: 3.8 mmol/L (ref 3.5–5.2)
Sodium: 141 mmol/L (ref 134–144)
eGFR: 77 mL/min/{1.73_m2} (ref >59)

## 2021-09-01 LAB — HEMOGLOBIN A1C
Est. average glucose Bld gHb Est-mCnc: 148 mg/dL
Hgb A1c MFr Bld: 6.8 % — ABNORMAL HIGH (ref 4.8–5.6)

## 2021-09-08 ENCOUNTER — Other Ambulatory Visit: Payer: Self-pay

## 2021-09-08 ENCOUNTER — Telehealth: Payer: Self-pay | Admitting: Family Medicine

## 2021-09-08 ENCOUNTER — Encounter: Payer: Self-pay | Admitting: Family Medicine

## 2021-09-08 ENCOUNTER — Ambulatory Visit: Payer: Medicare HMO | Admitting: Family Medicine

## 2021-09-08 ENCOUNTER — Ambulatory Visit (INDEPENDENT_AMBULATORY_CARE_PROVIDER_SITE_OTHER): Payer: Medicare HMO | Admitting: Family Medicine

## 2021-09-08 VITALS — BP 136/62 | Temp 97.2°F | Wt 167.4 lb

## 2021-09-08 DIAGNOSIS — E1159 Type 2 diabetes mellitus with other circulatory complications: Secondary | ICD-10-CM

## 2021-09-08 DIAGNOSIS — I70219 Atherosclerosis of native arteries of extremities with intermittent claudication, unspecified extremity: Secondary | ICD-10-CM | POA: Diagnosis not present

## 2021-09-08 DIAGNOSIS — I10 Essential (primary) hypertension: Secondary | ICD-10-CM | POA: Diagnosis not present

## 2021-09-08 DIAGNOSIS — E785 Hyperlipidemia, unspecified: Secondary | ICD-10-CM

## 2021-09-08 DIAGNOSIS — E1169 Type 2 diabetes mellitus with other specified complication: Secondary | ICD-10-CM | POA: Diagnosis not present

## 2021-09-08 DIAGNOSIS — I6523 Occlusion and stenosis of bilateral carotid arteries: Secondary | ICD-10-CM | POA: Diagnosis not present

## 2021-09-08 DIAGNOSIS — I700 Atherosclerosis of aorta: Secondary | ICD-10-CM

## 2021-09-08 MED ORDER — TRIAMTERENE-HCTZ 37.5-25 MG PO TABS: 1.0000 | ORAL_TABLET | Freq: Every day | ORAL | 1 refills | Status: DC

## 2021-09-08 MED ORDER — DOXAZOSIN MESYLATE 4 MG PO TABS: 4.0000 mg | ORAL_TABLET | Freq: Every day | ORAL | 1 refills | Status: DC

## 2021-09-08 MED ORDER — AMLODIPINE BESYLATE 10 MG PO TABS: 10.0000 mg | ORAL_TABLET | Freq: Every day | ORAL | 1 refills | Status: DC

## 2021-09-08 MED ORDER — CLOPIDOGREL BISULFATE 75 MG PO TABS: 75.0000 mg | ORAL_TABLET | Freq: Every day | ORAL | 1 refills | Status: DC

## 2021-09-08 MED ORDER — PANTOPRAZOLE SODIUM 40 MG PO TBEC: 40.0000 mg | DELAYED_RELEASE_TABLET | Freq: Every day | ORAL | 1 refills | Status: DC

## 2021-09-08 MED ORDER — METFORMIN HCL 500 MG PO TABS: ORAL_TABLET | ORAL | 1 refills | Status: DC

## 2021-09-08 MED ORDER — ATORVASTATIN CALCIUM 80 MG PO TABS: ORAL_TABLET | ORAL | 1 refills | Status: DC

## 2021-09-08 NOTE — Telephone Encounter (Signed)
Patient was seen this morning but forgot to ask if she could get something for her yeast infection. She stated she has had fluconazole in the past which worked well and wondered if she could a prescription for this today. Please advise.  [facility]  CB#  [phone]

## 2021-09-08 NOTE — Progress Notes (Addendum)
   Subjective:    Patient ID: Kara Kirby, female    DOB: Aug 29, 1946, 75 y.o.   MRN: [id number] This verifies that the history review of any tests, physical exam, assessment and plan were conducted by Dr. Sallee Lange and documented accordingly by him today Sallee Lange MD primary care [facility]  HPI Pt here for follow up on HTN and DM. Pt states doing ok. Not checking blood sugars on a regular basis. Sometimes has headaches, blurred vision at times.  Very nice patient Hypertension, unspecified type  Aortic atherosclerosis ([place])  Bilateral carotid artery stenosis  Type 2 diabetes mellitus with other circulatory complication, without long-term current use of insulin ([place])  Hyperlipidemia associated with type 2 diabetes mellitus ([place])   Review of Systems     Objective:   Physical Exam  General-in no acute distress Eyes-no discharge Lungs-respiratory rate normal, CTA CV-no murmurs,RRR Extremities skin warm dry no edema Neuro grossly normal Behavior normal, alert       Assessment & Plan:  1. Hypertension, unspecified type Blood pressure good control continue current measures.  Watch salt intake  2. Aortic atherosclerosis (HCC) Continue cholesterol medication recent labs look good follow-up again in 4 months to 5 months  3. Bilateral carotid artery stenosis Follows with vascular surgery on a yearly basis stable currently continue current anticoagulation measures of aspirin and Plavix  4. Type 2 diabetes mellitus with other circulatory complication, without long-term current use of insulin (HCC) Diabetes decent control continue current measures recent A1c looks good  5. Hyperlipidemia associated with type 2 diabetes mellitus (HCC) Previous cholesterol looks good healthy diet regular activity recommended

## 2021-09-09 MED ORDER — FLUCONAZOLE 150 MG PO TABS: 150.0000 mg | ORAL_TABLET | Freq: Once | ORAL | 2 refills | Status: AC

## 2021-09-09 NOTE — Telephone Encounter (Signed)
Prescription sent electronically to pharmacy. Patient advised per Dr Nicki Reaper:  typically 1 tablet does the job may repeat 1 week if necessary. Patient verbalized understanding.

## 2021-09-09 NOTE — Telephone Encounter (Signed)
Diflucan 150 mg 1 p.o. x1, may have 2 refills Please inform patient that typically 1 tablet does the job may repeat 1 week if necessary

## 2021-09-18 ENCOUNTER — Ambulatory Visit (HOSPITAL_COMMUNITY): Payer: Medicare HMO

## 2021-09-21 DIAGNOSIS — H401123 Primary open-angle glaucoma, left eye, severe stage: Secondary | ICD-10-CM | POA: Diagnosis not present

## 2021-09-21 DIAGNOSIS — H401111 Primary open-angle glaucoma, right eye, mild stage: Secondary | ICD-10-CM | POA: Diagnosis not present

## 2021-09-30 ENCOUNTER — Ambulatory Visit (HOSPITAL_COMMUNITY)
Admission: RE | Admit: 2021-09-30 | Discharge: 2021-09-30 | Disposition: A | Discharge status: Home / Self Care | Payer: Medicare HMO | Source: Ambulatory Visit | Attending: Family Medicine | Admitting: Family Medicine

## 2021-09-30 DIAGNOSIS — Z1231 Encounter for screening mammogram for malignant neoplasm of breast: Secondary | ICD-10-CM | POA: Diagnosis not present

## 2021-10-27 ENCOUNTER — Ambulatory Visit (INDEPENDENT_AMBULATORY_CARE_PROVIDER_SITE_OTHER): Payer: Medicare HMO | Admitting: Family Medicine

## 2021-10-27 ENCOUNTER — Other Ambulatory Visit: Payer: Self-pay

## 2021-10-27 DIAGNOSIS — E1159 Type 2 diabetes mellitus with other circulatory complications: Secondary | ICD-10-CM

## 2021-10-27 DIAGNOSIS — E785 Hyperlipidemia, unspecified: Secondary | ICD-10-CM

## 2021-10-27 DIAGNOSIS — M25561 Pain in right knee: Secondary | ICD-10-CM | POA: Diagnosis not present

## 2021-10-27 DIAGNOSIS — M25562 Pain in left knee: Secondary | ICD-10-CM | POA: Diagnosis not present

## 2021-10-27 DIAGNOSIS — I10 Essential (primary) hypertension: Secondary | ICD-10-CM

## 2021-10-27 NOTE — Progress Notes (Signed)
° °  Subjective:    Patient ID: Kara Kirby, female    DOB: 27-Apr-1946, 76 y.o.   MRN: [id number]  HPI Fall on Sunday , missed the last step going down steps , fell on Left knee , pain in both knees Patient states she was going down some steps at church outside the church she fell landed on both knees did not hit her head no loss of consciousness relates bilateral knee pain worse on the left than the right denies hip pain  Lingering cough and congestion, had sinus congestion weeks ago-moderate sinus infection a little while back now just a little bit of clear drainage with congestion coughing no fever chills or body aches  Review of Systems     Objective:   Physical Exam General-in no acute distress Eyes-no discharge Lungs-respiratory rate normal, CTA CV-no murmurs,RRR Extremities skin warm dry no edema Neuro grossly normal Behavior normal, alert        Assessment & Plan:  Left knee pain contusion should gradually get better I do not feel that she needs any x-rays we did discuss balance exercises and using a cane when she is outside always holding onto rails as well  Right knee pain and contusion should gradually get better  Head congestion drainage gradually getting better allow this time to go away no need for antibiotics  Labs before visit in the spring

## 2021-11-05 ENCOUNTER — Other Ambulatory Visit: Payer: Self-pay

## 2021-11-05 DIAGNOSIS — I6523 Occlusion and stenosis of bilateral carotid arteries: Secondary | ICD-10-CM

## 2021-11-09 ENCOUNTER — Ambulatory Visit (INDEPENDENT_AMBULATORY_CARE_PROVIDER_SITE_OTHER): Payer: Medicare HMO | Admitting: Physician Assistant

## 2021-11-09 ENCOUNTER — Ambulatory Visit (HOSPITAL_COMMUNITY)
Admission: RE | Admit: 2021-11-09 | Discharge: 2021-11-09 | Disposition: A | Discharge status: Home / Self Care | Payer: Medicare HMO | Source: Ambulatory Visit | Attending: Surgery | Admitting: Surgery

## 2021-11-09 ENCOUNTER — Other Ambulatory Visit: Payer: Self-pay

## 2021-11-09 VITALS — BP 169/81 | HR 69 | Temp 97.5°F | Resp 14 | Ht 64.0 in | Wt 165.0 lb

## 2021-11-09 DIAGNOSIS — I6523 Occlusion and stenosis of bilateral carotid arteries: Secondary | ICD-10-CM | POA: Insufficient documentation

## 2021-11-09 NOTE — Progress Notes (Signed)
Office Note     CC:  follow up Requesting Provider:  Kathyrn Drown, MD  HPI: Kara Kirby is a 76 y.o. (1946-06-10) female who presents for surveillance of carotid artery stenosis.  She has history of right carotid endarterectomy by Dr. Scot Dock in 2004 due to symptomatic stenosis.  She required transfemoral right internal carotid stenting due to recurrent asymptomatic stenosis by Dr. Oneida Alar in January 2017.  She was scheduled to follow-up next month however wanted a appointment sooner.  She fell while leaving church last weekend and was worried that it was caused by worsening carotid stenosis.  She has residual left leg weakness since her CVA in 2004.  She did not lose consciousness during the fall.  She believes the weakness in her left leg is unchanged.  She denies any other strokelike symptoms.  She was evaluated by her PCP for musculoskeletal injury.  She denies any ongoing constant pain.  She is on aspirin and statin daily.   Past Medical History:  Diagnosis Date   Allergy    Arthritis    Left shoulder   Carotid artery occlusion    CVA (cerebral vascular accident) ([place]) 02-19-13   DM (diabetes mellitus) ([place])    Dyslipidemia    GERD (gastroesophageal reflux disease)    intermittent Sx   Hematuria    Hip arthritis 2003   Left   HTN (hypertension)    Hyperlipidemia    Renal insufficiency, mild    Sleep related leg cramps     Past Surgical History:  Procedure Laterality Date   CAROTID ENDARTERECTOMY  RIGHT   COLONOSCOPY     COLONOSCOPY  10/19/2011   Procedure: COLONOSCOPY;  Surgeon: Dorothyann Peng, MD;  [facility];  Service: Endoscopy;  Laterality: N/A;  10:15   COLONOSCOPY N/A 08/16/2018   Procedure: COLONOSCOPY;  Surgeon: Danie Binder, MD;  [facility];  Service: Endoscopy;  Laterality: N/A;  9:30am   PERIPHERAL VASCULAR CATHETERIZATION Right 10/17/2015   Procedure: Carotid PTA/Stent Intervention;  Surgeon: Elam Dutch, MD;  Location: [facility];  Service: Cardiovascular;  Laterality: Right;   PERIPHERAL VASCULAR CATHETERIZATION Right 10/17/2015   Procedure: Carotid Angiography;  Surgeon: Elam Dutch, MD;  Location: [facility];  Service: Cardiovascular;  Laterality: Right;   S/P Hysterectomy     TOTAL ABDOMINAL HYSTERECTOMY     FIBROID, HYPERMENORRHEA    Social History   Socioeconomic History   Marital status: Divorced    Spouse name: Not on file   Number of children: Not on file   Years of education: Not on file   Highest education level: Not on file  Occupational History   Not on file  Tobacco Use   Smoking status: Never   Smokeless tobacco: Never  Substance and Sexual Activity   Alcohol use: No    Alcohol/week: 0.0 standard drinks   Drug use: No   Sexual activity: Not on file  Other Topics Concern   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, [place] [employer] [place])   Social Determinants of Health   Financial Resource Strain: Not on file  Food Insecurity: Not on file  Transportation Needs: Not on file  Physical Activity: Not on file  Stress: Not on file  Social Connections: Not on file  Intimate Partner Violence: Not on file    Family History  Problem Relation Age of Onset  Hypertension Mother    Heart attack Mother    Heart disease Mother    Hypertension Sister    Diabetes Sister        Amputation   Hyperlipidemia Sister    Heart attack Sister    Diabetes Sister    Hypertension Sister    Heart disease Sister        Before age 85   Heart disease Father    Colon cancer Neg Hx    Colon polyps Neg Hx     Current Outpatient Medications  Medication Sig Dispense Refill   acetaminophen (TYLENOL) 500 MG tablet Take 500-1,000 mg by mouth 2 (two) times daily as needed for mild pain.     amLODipine (NORVASC) 10 MG tablet Take 1 tablet (10 mg total) by mouth daily. 90 tablet 1   aspirin 81 MG tablet Take 81 mg by mouth daily.      atorvastatin (LIPITOR) 80 MG tablet TAKE 1 TABLET EVERY DAY AT 6:00 PM 90 tablet 1   blood glucose meter kit and supplies Dispense based on patient and insurance preference. Use to test glucose once daily (FOR ICD-10 E11.9). 1 each 0   brimonidine (ALPHAGAN) 0.2 % ophthalmic solution      clopidogrel (PLAVIX) 75 MG tablet Take 1 tablet (75 mg total) by mouth daily. 90 tablet 1   dorzolamide-timolol (COSOPT) 22.3-6.8 MG/ML ophthalmic solution Place 1 drop into both eyes 2 times daily.     doxazosin (CARDURA) 4 MG tablet Take 1 tablet (4 mg total) by mouth at bedtime. 90 tablet 1   fluticasone (FLONASE) 50 MCG/ACT nasal spray USE 2 SPRAYS IN EACH NOSTRIL ONE TIME DAILY 48 g 1   glucose blood (ONETOUCH ULTRA) test strip USE 1 STRIP TO CHECK GLUCOSE ONCE DAILY 50 each 2   latanoprost (XALATAN) 0.005 % ophthalmic solution Place 1 drop into both eyes at bedtime.     metFORMIN (GLUCOPHAGE) 500 MG tablet TAKE 2 TABLETS TWICE DAILY WITH MEALS 360 tablet 1   Multiple Vitamin (MULTIVITAMIN) tablet Take 1 tablet by mouth daily.     nitroGLYCERIN (NITROSTAT) 0.4 MG SL tablet DISSOLVE ONE TABLET UNDER THE TONGUE EVERY 5 MINUTES AS NEEDED FOR CHEST PAIN.  DO NOT EXCEED A TOTAL OF 3 DOSES IN 15 MINUTES (Patient taking differently: Place 0.4 mg under the tongue every 5 (five) minutes as needed for chest pain. DISSOLVE ONE TABLET UNDER THE TONGUE EVERY 5 MINUTES AS NEEDED FOR CHEST PAIN.  DO NOT EXCEED A TOTAL OF 3 DOSES IN 15 MINUTES) 25 tablet 0   OVER THE COUNTER MEDICATION Magnesium 443m one twice a day     pantoprazole (PROTONIX) 40 MG tablet Take 1 tablet (40 mg total) by mouth daily. 90 tablet 1   triamterene-hydrochlorothiazide (MAXZIDE-25) 37.5-25 MG tablet Take 1 tablet by mouth daily. 90 tablet 1   VENTOLIN HFA 108 (90 Base) MCG/ACT inhaler Inhale 2 puffs into the lungs every 4 (four) hours as needed. 36 g 5   amoxicillin (AMOXIL) 500 MG capsule 4 pills one hour before dental procedure (Patient not taking:  Reported on 10/27/2021) 4 capsule 2   No current facility-administered medications for this visit.    Allergies  Allergen Reactions   Other Hives    IV DYE    Iodinated Contrast Media Hives    Pre-medicate with benadryl and prednisone   Lisinopril     Angioedema of tongue July 2017     REVIEW OF SYSTEMS:   _0  denotes positive  finding, _0  denotes negative finding Cardiac  Comments:  Chest pain or chest pressure:    Shortness of breath upon exertion:    Short of breath when lying flat:    Irregular heart rhythm:        Vascular    Pain in calf, thigh, or hip brought on by ambulation:    Pain in feet at night that wakes you up from your sleep:     Blood clot in your veins:    Leg swelling:         Pulmonary    Oxygen at home:    Productive cough:     Wheezing:         Neurologic    Sudden weakness in arms or legs:     Sudden numbness in arms or legs:     Sudden onset of difficulty speaking or slurred speech:    Temporary loss of vision in one eye:     Problems with dizziness:         Gastrointestinal    Blood in stool:     Vomited blood:         Genitourinary    Burning when urinating:     Blood in urine:        Psychiatric    Major depression:         Hematologic    Bleeding problems:    Problems with blood clotting too easily:        Skin    Rashes or ulcers:        Constitutional    Fever or chills:      PHYSICAL EXAMINATION:  Vitals:   11/09/21 1137 11/09/21 1141  BP: (!) 155/77 (!) 169/81  Pulse: 65 69  Resp: 14   Temp: (!) 97.5 F (36.4 C)   TempSrc: Temporal   SpO2: 99%   Weight: 165 lb (74.8 kg)   Height: _1  (1.626 m)     General:  WDWN in NAD; vital signs documented above Gait: Not observed HENT: WNL, normocephalic Pulmonary: normal non-labored breathing , without Rales, rhonchi,  wheezing Cardiac: regular HR Abdomen: soft, NT, no masses Skin: without rashes Vascular Exam/Pulses:  Right Left  Radial 2+ (normal) 2+  (normal)  DP 2+ (normal) 2+ (normal)   Extremities: without ischemic changes, without Gangrene , without cellulitis; without open wounds;  Musculoskeletal: no muscle wasting or atrophy  Neurologic: A&O X 3;  No focal weakness or paresthesias are detected Psychiatric:  The pt has Normal affect.   Non-Invasive Vascular Imaging:   Patent right ICA stent without stenosis  Left ICA 40 to 59% stenosis    ASSESSMENT/PLAN:: 76 y.o. female here for follow up for surveillance of carotid artery stenosis  -Carotid duplex demonstrates a widely patent right ICA stent; left ICA stenosis also stable at 40 to 59% -Reassured the patient that carotid artery stenosis is mostly stable from duplex 1 year ago.  If she has any ongoing pain or symptoms that would suggest she had a CVA that may have caused her fall she will follow-up with her PCP or present to the emergency department.  She will continue her aspirin and statin daily.  We will see her back in 1 year for repeat carotid duplex   Dagoberto Ligas, PA-C [facility] [phone]  Clinic MD:   Carlis Abbott on call

## 2021-12-24 DIAGNOSIS — H34832 Tributary (branch) retinal vein occlusion, left eye, with macular edema: Secondary | ICD-10-CM | POA: Diagnosis not present

## 2021-12-24 DIAGNOSIS — H25813 Combined forms of age-related cataract, bilateral: Secondary | ICD-10-CM | POA: Diagnosis not present

## 2021-12-24 DIAGNOSIS — E113299 Type 2 diabetes mellitus with mild nonproliferative diabetic retinopathy without macular edema, unspecified eye: Secondary | ICD-10-CM | POA: Diagnosis not present

## 2021-12-30 ENCOUNTER — Other Ambulatory Visit: Payer: Self-pay

## 2021-12-30 ENCOUNTER — Encounter: Payer: Medicare HMO | Attending: Family Medicine | Admitting: Nutrition

## 2021-12-30 VITALS — Wt 168.0 lb

## 2021-12-30 DIAGNOSIS — I10 Essential (primary) hypertension: Secondary | ICD-10-CM | POA: Insufficient documentation

## 2021-12-30 DIAGNOSIS — E118 Type 2 diabetes mellitus with unspecified complications: Secondary | ICD-10-CM | POA: Diagnosis not present

## 2021-12-30 DIAGNOSIS — E782 Mixed hyperlipidemia: Secondary | ICD-10-CM | POA: Diagnosis not present

## 2021-12-30 NOTE — Patient Instructions (Signed)
Goal ? ?Increase plant based foods ?Keep drinking water ?Get back to walking ?Keep up the good job! ? ?

## 2021-12-30 NOTE — Progress Notes (Signed)
?Medical Nutrition Therapy:  Appt start time: 3646 end time:  1330 ?Assessment:  Primary concerns today: Diabetes Type 2. ?PCP: Dr. Wolfgang Phoenix     ?No recent A1C yet.Last A1C was 6.8%. ? ?140's in am. Started taking Magnesium daily 400 mg per day. ? ?Sees Rayetta Pigg, FNP at [facility]. ?Metformin 1000 mg BID ?Does yard work mowing yard and walks some for exercise. ? ?Wt Readings from Last 3 Encounters:  ?12/30/21 168 lb (76.2 kg)  ?11/09/21 165 lb (74.8 kg)  ?09/08/21 167 lb 6.4 oz (75.9 kg)  ? ?Ht Readings from Last 3 Encounters:  ?11/09/21 '5\' 4"'$  (1.626 m)  ?07/01/21 '5\' 4"'$  (1.626 m)  ?01/07/21 '5\' 4"'$  (1.626 m)  ? ?Body mass index is 28.84 kg/m?. ?'@BMIFA'$ @ ?Facility age limit for growth percentiles is 20 years. ?Facility age limit for growth percentiles is 20 years. ? ? ?Lab Results  ?Component Value Date  ? HGBA1C 6.8 (H) 08/31/2021  ? ? ?  Latest Ref Rng & Units 08/31/2021  ?  8:27 AM 04/27/2021  ?  9:03 AM 12/01/2020  ?  9:26 AM  ?CMP  ?Glucose 70 - 99 mg/dL 156   128   164    ?BUN 8 - 27 mg/dL '21   17   18    '$ ?Creatinine 0.57 - 1.00 mg/dL 0.79   0.79   1.00    ?Sodium 134 - 144 mmol/L 141   142   143    ?Potassium 3.5 - 5.2 mmol/L 3.8   3.7   3.8    ?Chloride 96 - 106 mmol/L 103   102   103    ?CO2 20 - 29 mmol/L '24   25   24    '$ ?Calcium 8.7 - 10.3 mg/dL 9.1   9.3   9.3    ?Total Protein 6.0 - 8.5 g/dL  6.4     ?Total Bilirubin 0.0 - 1.2 mg/dL  0.7     ?Alkaline Phos 44 - 121 IU/L  61     ?AST 0 - 40 IU/L  14     ?ALT 0 - 32 IU/L  19     ? ? ? ?Preferred Learning Style: ? ?No preference indicated  ? ?Learning Readiness:  ? ?Ready ?Change in progress ? ? ?MEDICATIONS: see list ?  ?DIETARY INTAKE: ? ? ?24-hr recall:  ?B) Oatmeal, banana, water ?L) leftovers, water ? D) broccoli stir fry, pork loin, potato, water ? ?Usual physical activity: walks, ? ?Estimated energy needs: ?1500  calories ?170 g carbohydrates ?112 g protein ?42g fat ? ?Progress Towards Goal(s):  In progress.Goals ?Nutritional  Diagnosis:  ?NB-1.1 Food and nutrition-related knowledge deficit As related to Diabetes Type 2.  As evidenced by A1C . ?   ?Intervention:  Nutrition and Diabetes education provided on My Plate, CHO counting, meal planning, portion sizes, timing of meals, avoiding snacks between meals unless having a low blood sugar, target ranges for A1C and blood sugars, signs/symptoms and treatment of hyper/hypoglycemia, monitoring blood sugars, taking medications as prescribed, benefits of exercising 30 minutes per day and prevention of complications of DM. ?. ?Goal ?Keep up the great job! ?May do sweet potato ?Increase plant based foods ?Increase walking to 30 minutes a day ? ? ? ?Teaching Method Utilized:  ? ?Auditory ? ?Handouts given during visit include: ?Verbally went over Plate Method, CHO counting and meal planning  ? ?Barriers to learning/adherence to lifestyle change: none ? ?Demonstrated degree of understanding via:  Teach Back  ? ?  Monitoring/Evaluation:  Dietary intake, exercise, , and body weight in 3 month(s). ? ? ? ? ? ? ?

## 2021-12-31 ENCOUNTER — Encounter: Payer: Self-pay | Admitting: Nutrition

## 2022-01-07 ENCOUNTER — Telehealth: Payer: Self-pay | Admitting: Family Medicine

## 2022-01-07 NOTE — Telephone Encounter (Signed)
?  Left message for patient to call back and schedule Medicare Annual Wellness Visit (AWV) in office.  ? ?If unable to come into the office for AWV,  please offer to do virtually or by telephone. ? ?No hx of AWV eligible for AWVI per [place] as of  04/11/2011  ? ?Please schedule at anytime with [facility] Advisor.     ? ?45 minute appointment  ? ?Any questions, please call me at [phone]   ?

## 2022-01-12 ENCOUNTER — Other Ambulatory Visit: Payer: Self-pay | Admitting: Family Medicine

## 2022-01-25 DIAGNOSIS — E1159 Type 2 diabetes mellitus with other circulatory complications: Secondary | ICD-10-CM | POA: Diagnosis not present

## 2022-01-25 DIAGNOSIS — I10 Essential (primary) hypertension: Secondary | ICD-10-CM | POA: Diagnosis not present

## 2022-01-25 DIAGNOSIS — E785 Hyperlipidemia, unspecified: Secondary | ICD-10-CM | POA: Diagnosis not present

## 2022-01-26 LAB — BASIC METABOLIC PANEL
BUN/Creatinine Ratio: 20 (ref 12–28)
BUN: 18 mg/dL (ref 8–27)
CO2: 26 mmol/L (ref 20–29)
Calcium: 9.3 mg/dL (ref 8.7–10.3)
Chloride: 104 mmol/L (ref 96–106)
Creatinine, Ser: 0.91 mg/dL (ref 0.57–1.00)
Glucose: 176 mg/dL — ABNORMAL HIGH (ref 70–99)
Potassium: 3.8 mmol/L (ref 3.5–5.2)
Sodium: 142 mmol/L (ref 134–144)
eGFR: 65 mL/min/{1.73_m2} (ref >59)

## 2022-01-26 LAB — HEPATIC FUNCTION PANEL
ALT: 16 IU/L (ref 0–32)
AST: 16 IU/L (ref 0–40)
Albumin: 4.2 g/dL (ref 3.7–4.7)
Alkaline Phosphatase: 62 IU/L (ref 44–121)
Bilirubin Total: 0.5 mg/dL (ref 0.0–1.2)
Bilirubin, Direct: 0.14 mg/dL (ref 0.00–0.40)
Total Protein: 6 g/dL (ref 6.0–8.5)

## 2022-01-26 LAB — MAGNESIUM: Magnesium: 1.5 mg/dL — ABNORMAL LOW (ref 1.6–2.3)

## 2022-01-26 LAB — HEMOGLOBIN A1C
Est. average glucose Bld gHb Est-mCnc: 160 mg/dL
Hgb A1c MFr Bld: 7.2 % — ABNORMAL HIGH (ref 4.8–5.6)

## 2022-01-26 LAB — LIPID PANEL
Chol/HDL Ratio: 2.9 ratio (ref 0.0–4.4)
Cholesterol, Total: 126 mg/dL (ref 100–199)
HDL: 43 mg/dL (ref >39)
LDL Chol Calc (NIH): 67 mg/dL (ref 0–99)
Triglycerides: 84 mg/dL (ref 0–149)
VLDL Cholesterol Cal: 16 mg/dL (ref 5–40)

## 2022-02-04 ENCOUNTER — Encounter: Payer: Self-pay | Admitting: Family Medicine

## 2022-02-04 ENCOUNTER — Telehealth: Payer: Self-pay

## 2022-02-04 ENCOUNTER — Ambulatory Visit (INDEPENDENT_AMBULATORY_CARE_PROVIDER_SITE_OTHER): Payer: Medicare HMO | Admitting: Family Medicine

## 2022-02-04 VITALS — BP 133/77 | HR 73 | Temp 98.1°F | Wt 167.8 lb

## 2022-02-04 DIAGNOSIS — I70219 Atherosclerosis of native arteries of extremities with intermittent claudication, unspecified extremity: Secondary | ICD-10-CM

## 2022-02-04 DIAGNOSIS — Z79899 Other long term (current) drug therapy: Secondary | ICD-10-CM

## 2022-02-04 DIAGNOSIS — I10 Essential (primary) hypertension: Secondary | ICD-10-CM | POA: Diagnosis not present

## 2022-02-04 DIAGNOSIS — E785 Hyperlipidemia, unspecified: Secondary | ICD-10-CM | POA: Diagnosis not present

## 2022-02-04 DIAGNOSIS — E1169 Type 2 diabetes mellitus with other specified complication: Secondary | ICD-10-CM | POA: Diagnosis not present

## 2022-02-04 MED ORDER — METFORMIN HCL 500 MG PO TABS: ORAL_TABLET | ORAL | 1 refills | Status: DC

## 2022-02-04 MED ORDER — PANTOPRAZOLE SODIUM 40 MG PO TBEC: 40.0000 mg | DELAYED_RELEASE_TABLET | Freq: Every day | ORAL | 1 refills | Status: DC

## 2022-02-04 MED ORDER — OLOPATADINE HCL 0.1 % OP SOLN: 1.0000 [drp] | Freq: Two times a day (BID) | OPHTHALMIC | 4 refills | Status: DC

## 2022-02-04 MED ORDER — AMOXICILLIN 500 MG PO CAPS: ORAL_CAPSULE | ORAL | 4 refills | Status: DC

## 2022-02-04 MED ORDER — DOXAZOSIN MESYLATE 4 MG PO TABS: 4.0000 mg | ORAL_TABLET | Freq: Every day | ORAL | 1 refills | Status: DC

## 2022-02-04 NOTE — Patient Instructions (Signed)

## 2022-02-04 NOTE — Progress Notes (Signed)
? ?Subjective:  ? ? Patient ID: Kara Kirby, female    DOB: 07/16/1946, 76 y.o.   MRN: [id number] ? ?HPI ?Pt here to follow up on DM and HTN. Sugars have been "fair, up and down". Pt has been seeing dietician, Kieth Brightly. 154 this morning. Blood pressure has been up and down.  ? ? ?Pt having right shoulder pain; hurting in upper arm area. Began about 2 weeks after scraping things off ceiling.  Sinuses have been "giving me a feet".  ?Pt would like refills on Amoxicillin; has dental work coming up.  ? ?Hyperlipidemia associated with type 2 diabetes mellitus ([place]) - Plan: Hemoglobin V3X, Basic Metabolic Panel (BMET), Lipid Profile, Hepatic function panel ? ?Hypertension, unspecified type - Plan: doxazosin (CARDURA) 4 MG tablet, Hemoglobin T0G, Basic Metabolic Panel (BMET), Lipid Profile, Hepatic function panel ? ?Atherosclerotic peripheral vascular disease with intermittent claudication ([facility]) - Plan: doxazosin (CARDURA) 4 MG tablet, Hemoglobin Y6R, Basic Metabolic Panel (BMET), Lipid Profile, Hepatic function panel ? ?High risk medication use - Plan: Hemoglobin S8N, Basic Metabolic Panel (BMET), Lipid Profile, Hepatic function panel ?Results for orders placed or performed in visit on 10/27/21  ?Magnesium  ?Result Value Ref Range  ? Magnesium 1.5 (L) 1.6 - 2.3 mg/dL  ?Hemoglobin A1c  ?Result Value Ref Range  ? Hgb A1c MFr Bld 7.2 (H) 4.8 - 5.6 %  ? Est. average glucose Bld gHb Est-mCnc 160 mg/dL  ?Basic metabolic panel  ?Result Value Ref Range  ? Glucose 176 (H) 70 - 99 mg/dL  ? BUN 18 8 - 27 mg/dL  ? Creatinine, Ser 0.91 0.57 - 1.00 mg/dL  ? eGFR 65 >59 mL/min/1.73  ? BUN/Creatinine Ratio 20 12 - 28  ? Sodium 142 134 - 144 mmol/L  ? Potassium 3.8 3.5 - 5.2 mmol/L  ? Chloride 104 96 - 106 mmol/L  ? CO2 26 20 - 29 mmol/L  ? Calcium 9.3 8.7 - 10.3 mg/dL  ?Lipid panel  ?Result Value Ref Range  ? Cholesterol, Total 126 100 - 199 mg/dL  ? Triglycerides 84 0 - 149 mg/dL  ? HDL 43 >39 mg/dL  ? VLDL Cholesterol Cal 16 5 - 40  mg/dL  ? LDL Chol Calc (NIH) 67 0 - 99 mg/dL  ? Chol/HDL Ratio 2.9 0.0 - 4.4 ratio  ?Hepatic function panel  ?Result Value Ref Range  ? Total Protein 6.0 6.0 - 8.5 g/dL  ? Albumin 4.2 3.7 - 4.7 g/dL  ? Bilirubin Total 0.5 0.0 - 1.2 mg/dL  ? Bilirubin, Direct 0.14 0.00 - 0.40 mg/dL  ? Alkaline Phosphatase 62 44 - 121 IU/L  ? AST 16 0 - 40 IU/L  ? ALT 16 0 - 32 IU/L  ? ?Her diabetes seems to be under decent control A1c has gone up healthier diet recommended regular physical activity recommended ? ?Has significant ongoing chronic allergies worse in the springtime we did discuss measures ? ?Also with significant additional issues related to diabetes but denies any low sugar spells ?Review of Systems ? ?   ?Objective:  ? Physical Exam ?Lungs are clear heart regular pulse normal BP good extremities no edema ?Limited range of motion of the right shoulder is noted ? ? ?   ?Assessment & Plan:  ?Shoulder pain ?Regular exercises shown ?If any ongoing troubles notify us ? ?We will mail patient some exercises she can do for her shoulder ? ?Diabetes could be under better control we talked about dietary measures healthy diet ? ?Allergies talked about allergy  medicines she currently has sinus congestion but no sign of infection ? ?Blood pressure good control continue current measures ? ?Hyperlipidemia decent control continue ?If shoulder does not improve over the next month to let us know we will help set up with orthopedics ? ? ?

## 2022-02-04 NOTE — Telephone Encounter (Signed)
Caller name:Shanielle Rials  ? ?On DPR? :Yes ? ?Call back number:[phone] ? ?Provider they see:  ? ?Reason for call: ?Pt is using refresh eye drops lubricant  ?

## 2022-02-05 NOTE — Telephone Encounter (Signed)
Pt returned call and verbalized understanding  

## 2022-02-05 NOTE — Telephone Encounter (Signed)
These drops help with moisture in the eyes but do not do anything otherwise for allergies ? ?The eyedrop prescription for allergies that we sent in yesterday can help with allergies symptoms in the eyes. ?If she still wants to use a moisturizer eyedrops she still can ?

## 2022-02-05 NOTE — Telephone Encounter (Signed)
Left message to return call 

## 2022-02-05 NOTE — Progress Notes (Signed)
Shoulder exercises mailed to patient. ?

## 2022-02-09 ENCOUNTER — Telehealth: Payer: Self-pay | Admitting: Family Medicine

## 2022-02-09 NOTE — Telephone Encounter (Signed)
Patient notified and verbalized understanding. Patient stated she received the denial letter. ?

## 2022-02-09 NOTE — Telephone Encounter (Signed)
So at this point I think the best thing the patient can do is over-the-counter allergy eyedrops on a as needed basis ?

## 2022-02-09 NOTE — Telephone Encounter (Signed)
PA completed for Olopatadine Eye Drops have been denied. Pt has not met criteria such as trying and/or failing olopatadine 0.2%, azelastine, Zerviate or cromolyn eye drops. Please advise. Thank you.  ?

## 2022-02-09 NOTE — Telephone Encounter (Signed)
LMTRC

## 2022-03-14 ENCOUNTER — Other Ambulatory Visit: Payer: Self-pay | Admitting: Family Medicine

## 2022-03-22 DIAGNOSIS — H401111 Primary open-angle glaucoma, right eye, mild stage: Secondary | ICD-10-CM | POA: Diagnosis not present

## 2022-03-22 DIAGNOSIS — H401123 Primary open-angle glaucoma, left eye, severe stage: Secondary | ICD-10-CM | POA: Diagnosis not present

## 2022-04-08 DIAGNOSIS — H25813 Combined forms of age-related cataract, bilateral: Secondary | ICD-10-CM | POA: Diagnosis not present

## 2022-04-08 DIAGNOSIS — H34832 Tributary (branch) retinal vein occlusion, left eye, with macular edema: Secondary | ICD-10-CM | POA: Diagnosis not present

## 2022-04-08 DIAGNOSIS — H401111 Primary open-angle glaucoma, right eye, mild stage: Secondary | ICD-10-CM | POA: Diagnosis not present

## 2022-04-08 DIAGNOSIS — H401123 Primary open-angle glaucoma, left eye, severe stage: Secondary | ICD-10-CM | POA: Diagnosis not present

## 2022-04-08 DIAGNOSIS — E113299 Type 2 diabetes mellitus with mild nonproliferative diabetic retinopathy without macular edema, unspecified eye: Secondary | ICD-10-CM | POA: Diagnosis not present

## 2022-04-28 ENCOUNTER — Other Ambulatory Visit: Payer: Self-pay | Admitting: *Deleted

## 2022-04-28 ENCOUNTER — Telehealth: Payer: Self-pay | Admitting: Family Medicine

## 2022-04-28 DIAGNOSIS — E1159 Type 2 diabetes mellitus with other circulatory complications: Secondary | ICD-10-CM

## 2022-04-28 NOTE — Telephone Encounter (Signed)
Please give referral for diabetes nutrition Apparently pt gets visits with [facility] and needs a renewal of the referral thanks

## 2022-04-29 NOTE — Telephone Encounter (Signed)
Referral generated

## 2022-05-06 ENCOUNTER — Ambulatory Visit (INDEPENDENT_AMBULATORY_CARE_PROVIDER_SITE_OTHER): Payer: Medicare HMO | Admitting: Family Medicine

## 2022-05-06 ENCOUNTER — Ambulatory Visit (HOSPITAL_COMMUNITY)
Admission: RE | Admit: 2022-05-06 | Discharge: 2022-05-06 | Disposition: A | Discharge status: Home / Self Care | Payer: Medicare HMO | Source: Ambulatory Visit | Attending: Family Medicine | Admitting: Family Medicine

## 2022-05-06 ENCOUNTER — Ambulatory Visit: Payer: Medicare HMO | Admitting: Family Medicine

## 2022-05-06 VITALS — BP 186/77 | HR 75 | Temp 98.7°F | Ht 64.0 in | Wt 164.4 lb

## 2022-05-06 DIAGNOSIS — M25512 Pain in left shoulder: Secondary | ICD-10-CM

## 2022-05-06 DIAGNOSIS — M25511 Pain in right shoulder: Secondary | ICD-10-CM | POA: Diagnosis not present

## 2022-05-06 DIAGNOSIS — G8929 Other chronic pain: Secondary | ICD-10-CM

## 2022-05-06 MED ORDER — TRAMADOL HCL 50 MG PO TABS
50.0000 mg | ORAL_TABLET | Freq: Three times a day (TID) | ORAL | 0 refills | Status: DC | PRN
Start: 2022-05-06 — End: 2023-03-21

## 2022-05-06 NOTE — Patient Instructions (Signed)
Xrays at the hospital today.  We will call with the results and discuss treatment.  Take care  Dr. Lacinda Axon

## 2022-05-06 NOTE — Progress Notes (Addendum)
Subjective:  Patient ID: Kara Kirby, female    DOB: [birth year]  Age: 76 y.o. MRN: [id number]  CC: Chief Complaint  Patient presents with   Shoulder Pain    Both shoulders bothering her for over a month.    HPI:  76 year old female presents with complaints of bilateral shoulder pain.  Patient reports ongoing bilateral shoulder pain.  Patient states that it has been bothering her more over the past month.  However, she discussed this with Dr. Wolfgang Phoenix in April.  Started after scraping a ceiling.  Patient continues to have shoulder pain/upper arm pain right greater than left.  She uses Tylenol without relief.  No other medications or interventions tried.  Seems to have good range of motion.  No other complaints or concerns at this time.  Patient Active Problem List   Diagnosis Date Noted   Chronic pain of both shoulders 05/06/2022   Aortic atherosclerosis ([place]) 09/08/2020   Rectal bleeding 06/21/2018   Glaucoma 11/07/2017   Carotid stenosis 10/17/2015   Insomnia 06/03/2014   Atherosclerotic peripheral vascular disease with intermittent claudication ([place]) 02/01/2014   Aftercare following surgery of the circulatory system, [place] 05/23/2013   H/O acute sinusitis 03/02/2013   HTN (hypertension) 02/19/2013   Diabetes ([place]) 02/19/2013   Hyperlipidemia associated with type 2 diabetes mellitus ([place]) 02/19/2013   Stroke ([place]) 02/18/2013   Occlusion and stenosis of carotid artery without mention of cerebral infarction 01/10/2013    Social Hx   Social History   Socioeconomic History   Marital status: Divorced    Spouse name: Not on file   Number of children: Not on file   Years of education: Not on file   Highest education level: Not on file  Occupational History   Not on file  Tobacco Use   Smoking status: Never   Smokeless tobacco: Never  Substance and Sexual Activity   Alcohol use: No    Alcohol/week: 0.0 standard drinks of alcohol   Drug use: No   Sexual activity: Not  on file  Other Topics Concern   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, [place] [employer] [place])   Social Determinants of Health   Financial Resource Strain: Not on file  Food Insecurity: Not on file  Transportation Needs: Not on file  Physical Activity: Not on file  Stress: Not on file  Social Connections: Not on file    Review of Systems Per HPI  Objective:  BP (!) 186/77   Pulse 75   Temp 98.7 F (37.1 C) (Oral)   Ht '5\' 4"'$  (1.626 m)   Wt 164 lb 6.4 oz (74.6 kg)   SpO2 100%   BMI 28.22 kg/m      05/06/2022   10:28 AM 02/04/2022   10:46 AM 12/30/2021    1:15 PM  BP/Weight  Systolic BP 034 742   Diastolic BP 77 77   Wt. (Lbs) 164.4 167.8 168  BMI 28.22 kg/m2 28.8 kg/m2 28.84 kg/m2    Physical Exam Constitutional:      General: She is not in acute distress.    Appearance: Normal appearance. She is not ill-appearing.  HENT:     Head: Normocephalic and atraumatic.  Cardiovascular:     Rate and Rhythm: Normal rate and regular rhythm.  Pulmonary:     Effort: Pulmonary effort is normal. No respiratory distress.  Musculoskeletal:     Comments: Good range of motion of the shoulders.  Normal rotator cuff strength.  Negative empty can.  Neurological:     Mental Status: She is alert.  Psychiatric:        Mood and Affect: Mood normal.        Behavior: Behavior normal.     Lab Results  Component Value Date   WBC 6.2 01/14/2020   HGB 13.6 01/14/2020   HCT 41.0 01/14/2020   PLT 243 01/14/2020   GLUCOSE 176 (H) 01/25/2022   CHOL 126 01/25/2022   TRIG 84 01/25/2022   HDL 43 01/25/2022   LDLCALC 67 01/25/2022   ALT 16 01/25/2022   AST 16 01/25/2022   NA 142 01/25/2022   K 3.8 01/25/2022   CL 104 01/25/2022   CREATININE 0.91 01/25/2022   BUN 18 01/25/2022   CO2 26 01/25/2022   TSH 1.520 05/19/2020   INR 0.91 02/18/2013   HGBA1C 7.2 (H) 01/25/2022   MICROALBUR 0.8 09/10/2014     Assessment & Plan:   Problem  List Items Addressed This Visit       Other   Chronic pain of both shoulders - Primary    X-rays obtained today and were independently reviewed by me.  No significant findings in either shoulder. Tramadol as needed for pain.  Referring to orthopedics.      Relevant Medications   traMADol (ULTRAM) 50 MG tablet   Other Relevant Orders   DG Shoulder Left (Completed)   DG Shoulder Right (Completed)   Ambulatory referral to Orthopedic Surgery    Meds ordered this encounter  Medications   traMADol (ULTRAM) 50 MG tablet    Sig: Take 1 tablet (50 mg total) by mouth every 8 (eight) hours as needed for moderate pain or severe pain.    Dispense:  15 tablet    Refill:  [place]

## 2022-05-06 NOTE — Addendum Note (Signed)
Addended by: Coral Spikes on: 05/06/2022 12:20 PM   Modules accepted: Orders

## 2022-05-06 NOTE — Assessment & Plan Note (Signed)
X-rays obtained today and were independently reviewed by me.  No significant findings in either shoulder. Tramadol as needed for pain.  Referring to orthopedics.

## 2022-05-20 ENCOUNTER — Ambulatory Visit: Payer: Medicare HMO | Admitting: Family Medicine

## 2022-05-25 ENCOUNTER — Ambulatory Visit: Payer: Medicare HMO | Admitting: Orthopedic Surgery

## 2022-05-25 ENCOUNTER — Encounter: Payer: Self-pay | Admitting: Orthopedic Surgery

## 2022-05-25 VITALS — BP 159/75 | HR 67 | Ht 64.0 in | Wt 165.4 lb

## 2022-05-25 DIAGNOSIS — M7581 Other shoulder lesions, right shoulder: Secondary | ICD-10-CM | POA: Diagnosis not present

## 2022-05-25 NOTE — Patient Instructions (Addendum)

## 2022-05-26 NOTE — Progress Notes (Signed)
New Patient Visit  Assessment: Kara Kirby is a 76 y.o. female with the following: 1. Tendinitis of right rotator cuff  Plan: Kara Kirby has pain in bilateral shoulders.  Right is worse than left.  The left continues to improve.  She is concerned about the decreased function and pain she is having in her right shoulder.  Radiographs are without obvious injury.  Atraumatic onset of pain.  We discussed proceeding with a steroid injection, and she elected to proceed.  This was completed in clinic today without issues.  Home exercise program was provided.  Follow-up as needed.  Procedure note injection - Right shoulder    Verbal consent was obtained to inject the right shoulder, subacromial space Timeout was completed to confirm the site of injection.   The skin was prepped with alcohol and ethyl chloride was sprayed at the injection site.  A 21-gauge needle was used to inject 40 mg of Depo-Medrol and 1% lidocaine (3 cc) into the subacromial space of the right shoulder using a posterolateral approach.  There were no complications.  A sterile bandage was applied.    Follow-up: Return if symptoms worsen or fail to improve.  Subjective:  Chief Complaint  Patient presents with   Shoulder Pain    Bilat/R > L// they have been hurting for several weeks. X-rays have been taken about a couple of weeks.    History of Present Illness: Kara Kirby is a 76 y.o. female who has been referred by  Thersa Salt, MD for evaluation of bilateral shoulder pain.  She states she has had pain in both shoulders for several weeks.  Atraumatic onset.  Pain started in April.  Left shoulder has improved.  She continues to have severe pain and difficulty with motion in the right shoulder.  Pain is worse at night.   Review of Systems: No fevers or chills No numbness or tingling No chest pain No shortness of breath No bowel or bladder dysfunction No GI distress No headaches   Medical  History:  Past Medical History:  Diagnosis Date   Allergy    Arthritis    Left shoulder   Carotid artery occlusion    CVA (cerebral vascular accident) ([place]) 02-19-13   DM (diabetes mellitus) ([place])    Dyslipidemia    GERD (gastroesophageal reflux disease)    intermittent Sx   Hematuria    Hip arthritis 2003   Left   HTN (hypertension)    Hyperlipidemia    Renal insufficiency, mild    Sleep related leg cramps     Past Surgical History:  Procedure Laterality Date   CAROTID ENDARTERECTOMY  RIGHT   COLONOSCOPY     COLONOSCOPY  10/19/2011   Procedure: COLONOSCOPY;  Surgeon: Dorothyann Peng, MD;  [facility];  Service: Endoscopy;  Laterality: N/A;  10:15   COLONOSCOPY N/A 08/16/2018   Procedure: COLONOSCOPY;  Surgeon: Danie Binder, MD;  Location: [facility];  Service: Endoscopy;  Laterality: N/A;  9:30am   PERIPHERAL VASCULAR CATHETERIZATION Right 10/17/2015   Procedure: Carotid PTA/Stent Intervention;  Surgeon: Elam Dutch, MD;  Location: [facility];  Service: Cardiovascular;  Laterality: Right;   PERIPHERAL VASCULAR CATHETERIZATION Right 10/17/2015   Procedure: Carotid Angiography;  Surgeon: Elam Dutch, MD;  Location: [facility];  Service: Cardiovascular;  Laterality: Right;   S/P Hysterectomy     TOTAL ABDOMINAL HYSTERECTOMY     FIBROID, HYPERMENORRHEA    Family History  Problem Relation Age of Onset   Hypertension Mother    Heart attack Mother    Heart disease Mother    Hypertension Sister    Diabetes Sister        Amputation   Hyperlipidemia Sister    Heart attack Sister    Diabetes Sister    Hypertension Sister    Heart disease Sister        Before age 54   Heart disease Father    Colon cancer Neg Hx    Colon polyps Neg Hx    Social History   Tobacco Use   Smoking status: Never   Smokeless tobacco: Never  Substance Use Topics   Alcohol use: No    Alcohol/week: 0.0 standard drinks of alcohol   Drug use: No     Allergies  Allergen Reactions   Other Hives    IV DYE    Iodinated Contrast Media Hives    Pre-medicate with benadryl and prednisone   Lisinopril     Angioedema of tongue July 2017    Current Meds  Medication Sig   acetaminophen (TYLENOL) 500 MG tablet Take 500-1,000 mg by mouth 2 (two) times daily as needed for mild pain.   amLODipine (NORVASC) 10 MG tablet Take 1 tablet (10 mg total) by mouth daily.   amoxicillin (AMOXIL) 500 MG capsule 4 pills one hour before dental procedure   aspirin 81 MG tablet Take 81 mg by mouth daily.   atorvastatin (LIPITOR) 80 MG tablet TAKE 1 TABLET EVERY DAY AT 6:00 PM   blood glucose meter kit and supplies Dispense based on patient and insurance preference. Use to test glucose once daily (FOR ICD-10 E11.9).   brimonidine (ALPHAGAN) 0.2 % ophthalmic solution    clopidogrel (PLAVIX) 75 MG tablet TAKE 1 TABLET EVERY DAY   doxazosin (CARDURA) 4 MG tablet Take 1 tablet (4 mg total) by mouth at bedtime.   fluticasone (FLONASE) 50 MCG/ACT nasal spray USE 2 SPRAYS IN EACH NOSTRIL ONE TIME DAILY   latanoprost (XALATAN) 0.005 % ophthalmic solution Place 1 drop into both eyes at bedtime.   metFORMIN (GLUCOPHAGE) 500 MG tablet TAKE 2 TABLETS TWICE DAILY WITH MEALS   Multiple Vitamin (MULTIVITAMIN) tablet Take 1 tablet by mouth daily.   nitroGLYCERIN (NITROSTAT) 0.4 MG SL tablet DISSOLVE ONE TABLET UNDER THE TONGUE EVERY 5 MINUTES AS NEEDED FOR CHEST PAIN.  DO NOT EXCEED A TOTAL OF 3 DOSES IN 15 MINUTES (Patient taking differently: Place 0.4 mg under the tongue every 5 (five) minutes as needed for chest pain. DISSOLVE ONE TABLET UNDER THE TONGUE EVERY 5 MINUTES AS NEEDED FOR CHEST PAIN.  DO NOT EXCEED A TOTAL OF 3 DOSES IN 15 MINUTES)   olopatadine (PATANOL) 0.1 % ophthalmic solution Place 1 drop into both eyes 2 (two) times daily.   ONETOUCH ULTRA test strip USE 1 STRIP TO CHECK GLUCOSE ONCE DAILY   OVER THE COUNTER MEDICATION Magnesium 469m one twice a day    pantoprazole (PROTONIX) 40 MG tablet Take 1 tablet (40 mg total) by mouth daily.   traMADol (ULTRAM) 50 MG tablet Take 1 tablet (50 mg total) by mouth every 8 (eight) hours as needed for moderate pain or severe pain.   triamterene-hydrochlorothiazide (MAXZIDE-25) 37.5-25 MG tablet Take 1 tablet by mouth daily.   VENTOLIN HFA 108 (90 Base) MCG/ACT inhaler Inhale 2 puffs into the lungs every 4 (four) hours as needed.    Objective: BP (!) 159/75   Pulse 67  Ht '5\' 4"'  (1.626 m)   Wt 165 lb 6 oz (75 kg)   BMI 28.39 kg/m   Physical Exam:  General: Alert and oriented.  No acute distress Gait: Normal gait.  Total shoulder without deformity.  No swelling.  She has near full range of motion of the left shoulder.  Mild discomfort at the extremes of motion.  Restricted range of motion right shoulder due to pain.  External rotation at her side is approximately the same as the contralateral side.  Tenderness palpation over the anterior shoulder.  Fingers are warm and well-perfused.  Sensations intact throughout both hands.  IMAGING: I personally reviewed images previously obtained in clinic  Radiographs of both shoulders were previously obtained.  Negative for acute injuries.  No arthritis.   New Medications:  No orders of the defined types were placed in this encounter.     Mordecai Rasmussen, MD  05/26/2022 12:18 PM

## 2022-06-01 ENCOUNTER — Encounter: Payer: Medicare HMO | Admitting: Nutrition

## 2022-06-02 ENCOUNTER — Other Ambulatory Visit: Payer: Self-pay | Admitting: Family Medicine

## 2022-06-02 DIAGNOSIS — I10 Essential (primary) hypertension: Secondary | ICD-10-CM

## 2022-06-02 DIAGNOSIS — I70219 Atherosclerosis of native arteries of extremities with intermittent claudication, unspecified extremity: Secondary | ICD-10-CM

## 2022-06-28 DIAGNOSIS — E785 Hyperlipidemia, unspecified: Secondary | ICD-10-CM | POA: Diagnosis not present

## 2022-06-28 DIAGNOSIS — Z79899 Other long term (current) drug therapy: Secondary | ICD-10-CM | POA: Diagnosis not present

## 2022-06-28 DIAGNOSIS — E1169 Type 2 diabetes mellitus with other specified complication: Secondary | ICD-10-CM | POA: Diagnosis not present

## 2022-06-28 DIAGNOSIS — I70219 Atherosclerosis of native arteries of extremities with intermittent claudication, unspecified extremity: Secondary | ICD-10-CM | POA: Diagnosis not present

## 2022-06-28 DIAGNOSIS — I10 Essential (primary) hypertension: Secondary | ICD-10-CM | POA: Diagnosis not present

## 2022-06-29 ENCOUNTER — Encounter: Payer: Self-pay | Admitting: Family Medicine

## 2022-06-29 LAB — BASIC METABOLIC PANEL
BUN/Creatinine Ratio: 15 (ref 12–28)
BUN: 15 mg/dL (ref 8–27)
CO2: 26 mmol/L (ref 20–29)
Calcium: 9.3 mg/dL (ref 8.7–10.3)
Chloride: 104 mmol/L (ref 96–106)
Creatinine, Ser: 0.99 mg/dL (ref 0.57–1.00)
Glucose: 169 mg/dL — ABNORMAL HIGH (ref 70–99)
Potassium: 3.7 mmol/L (ref 3.5–5.2)
Sodium: 143 mmol/L (ref 134–144)
eGFR: 59 mL/min/{1.73_m2} — ABNORMAL LOW (ref >59)

## 2022-06-29 LAB — HEPATIC FUNCTION PANEL
ALT: 16 IU/L (ref 0–32)
AST: 15 IU/L (ref 0–40)
Albumin: 4 g/dL (ref 3.8–4.8)
Alkaline Phosphatase: 64 IU/L (ref 44–121)
Bilirubin Total: 0.5 mg/dL (ref 0.0–1.2)
Bilirubin, Direct: 0.14 mg/dL (ref 0.00–0.40)
Total Protein: 6.4 g/dL (ref 6.0–8.5)

## 2022-06-29 LAB — LIPID PANEL
Chol/HDL Ratio: 3.4 ratio (ref 0.0–4.4)
Cholesterol, Total: 134 mg/dL (ref 100–199)
HDL: 40 mg/dL (ref >39)
LDL Chol Calc (NIH): 73 mg/dL (ref 0–99)
Triglycerides: 112 mg/dL (ref 0–149)
VLDL Cholesterol Cal: 21 mg/dL (ref 5–40)

## 2022-06-29 LAB — HEMOGLOBIN A1C
Est. average glucose Bld gHb Est-mCnc: 171 mg/dL
Hgb A1c MFr Bld: 7.6 % — ABNORMAL HIGH (ref 4.8–5.6)

## 2022-06-29 NOTE — Progress Notes (Signed)
Please mail thank you

## 2022-07-05 DIAGNOSIS — H401111 Primary open-angle glaucoma, right eye, mild stage: Secondary | ICD-10-CM | POA: Diagnosis not present

## 2022-07-05 DIAGNOSIS — H34832 Tributary (branch) retinal vein occlusion, left eye, with macular edema: Secondary | ICD-10-CM | POA: Diagnosis not present

## 2022-07-05 DIAGNOSIS — E1136 Type 2 diabetes mellitus with diabetic cataract: Secondary | ICD-10-CM | POA: Diagnosis not present

## 2022-07-05 DIAGNOSIS — E113293 Type 2 diabetes mellitus with mild nonproliferative diabetic retinopathy without macular edema, bilateral: Secondary | ICD-10-CM | POA: Diagnosis not present

## 2022-07-05 DIAGNOSIS — H401123 Primary open-angle glaucoma, left eye, severe stage: Secondary | ICD-10-CM | POA: Diagnosis not present

## 2022-07-05 DIAGNOSIS — H25813 Combined forms of age-related cataract, bilateral: Secondary | ICD-10-CM | POA: Diagnosis not present

## 2022-07-05 DIAGNOSIS — Z7984 Long term (current) use of oral hypoglycemic drugs: Secondary | ICD-10-CM | POA: Diagnosis not present

## 2022-07-08 ENCOUNTER — Ambulatory Visit: Payer: Self-pay | Admitting: Family Medicine

## 2022-07-09 ENCOUNTER — Ambulatory Visit (INDEPENDENT_AMBULATORY_CARE_PROVIDER_SITE_OTHER): Payer: Medicare HMO | Admitting: Family Medicine

## 2022-07-09 ENCOUNTER — Encounter: Payer: Self-pay | Admitting: Family Medicine

## 2022-07-09 VITALS — BP 126/74 | Wt 162.8 lb

## 2022-07-09 DIAGNOSIS — I10 Essential (primary) hypertension: Secondary | ICD-10-CM | POA: Diagnosis not present

## 2022-07-09 DIAGNOSIS — I700 Atherosclerosis of aorta: Secondary | ICD-10-CM

## 2022-07-09 DIAGNOSIS — Z23 Encounter for immunization: Secondary | ICD-10-CM | POA: Diagnosis not present

## 2022-07-09 DIAGNOSIS — E1159 Type 2 diabetes mellitus with other circulatory complications: Secondary | ICD-10-CM

## 2022-07-09 DIAGNOSIS — E785 Hyperlipidemia, unspecified: Secondary | ICD-10-CM | POA: Diagnosis not present

## 2022-07-09 DIAGNOSIS — E1169 Type 2 diabetes mellitus with other specified complication: Secondary | ICD-10-CM | POA: Diagnosis not present

## 2022-07-09 NOTE — Patient Instructions (Signed)

## 2022-07-09 NOTE — Progress Notes (Signed)
   Subjective:    Patient ID: Kara Kirby, female    DOB: 01/14/1946, 76 y.o.   MRN: [id number]  Diabetes She presents for her follow-up diabetic visit. She has type 2 diabetes mellitus. There are no hypoglycemic associated symptoms. There are no diabetic associated symptoms. There are no hypoglycemic complications. There are no diabetic complications. Risk factors for coronary artery disease include dyslipidemia. She is compliant with treatment all of the time. (132 this morning; checking sugars about once per week) She does not see a podiatrist.Eye exam is current (eye dr states cataracts need to be taken off).    Pt states her right arm is still bother her. Has seen Dr.Cairns. Need for vaccination - Plan: Flu Vaccine QUAD High Dose(Fluad)  Type 2 diabetes mellitus with other circulatory complication, without long-term current use of insulin (HCC) - Plan: Hemoglobin A1c, Microalbumin/Creatinine Ratio, Urine    Review of Systems     Objective:   Physical Exam  General-in no acute distress Eyes-no discharge Lungs-respiratory rate normal, CTA CV-no murmurs,RRR Extremities skin warm dry no edema Neuro grossly normal Behavior normal, alert       Assessment & Plan:  1. Need for vaccination Today - Flu Vaccine QUAD High Dose(Fluad)  2. Type 2 diabetes mellitus with other circulatory complication, without long-term current use of insulin (HCC) Healthy diet regular activity recommended A1c went up patient to work really hard on it she does not want to be on more medicines.  We will see what her readings are next time she comes in - Hemoglobin A1c - Microalbumin/Creatinine Ratio, Urine  3. Hypertension, unspecified type Blood pressure good control continue current measures minimize salt in the diet  4. Aortic atherosclerosis ([place]) Very important to keep LDL low recent labs reviewed with patient continue current measures  5. Hyperlipidemia associated with type 2 diabetes  mellitus ([place]) Continue current measures  Follow-up in 3 months lab work before that time as well as urine ACR

## 2022-07-21 DIAGNOSIS — R051 Acute cough: Secondary | ICD-10-CM | POA: Diagnosis not present

## 2022-07-22 ENCOUNTER — Encounter: Payer: Self-pay | Admitting: Nurse Practitioner

## 2022-07-22 ENCOUNTER — Ambulatory Visit (HOSPITAL_COMMUNITY)
Admission: RE | Admit: 2022-07-22 | Discharge: 2022-07-22 | Disposition: A | Discharge status: Home / Self Care | Payer: Medicare HMO | Source: Ambulatory Visit | Attending: Nurse Practitioner | Admitting: Nurse Practitioner

## 2022-07-22 ENCOUNTER — Ambulatory Visit (INDEPENDENT_AMBULATORY_CARE_PROVIDER_SITE_OTHER): Payer: Medicare HMO | Admitting: Nurse Practitioner

## 2022-07-22 VITALS — BP 140/80 | HR 85 | Temp 97.7°F

## 2022-07-22 DIAGNOSIS — R051 Acute cough: Secondary | ICD-10-CM

## 2022-07-22 DIAGNOSIS — R059 Cough, unspecified: Secondary | ICD-10-CM | POA: Diagnosis not present

## 2022-07-22 DIAGNOSIS — R0602 Shortness of breath: Secondary | ICD-10-CM | POA: Diagnosis not present

## 2022-07-22 MED ORDER — GUAIFENESIN 100 MG/5ML PO LIQD: 5.0000 mL | ORAL | 0 refills | Status: DC | PRN

## 2022-07-22 MED ORDER — FEXOFENADINE HCL 60 MG PO TABS: 60.0000 mg | ORAL_TABLET | Freq: Two times a day (BID) | ORAL | 1 refills | Status: AC

## 2022-07-22 NOTE — Progress Notes (Signed)
   Subjective:    Patient ID: Kara Kirby, female    DOB: October 23, 1945, 76 y.o.   MRN: [id number]  HPI Pt arrives with ear pain, sore throat, non productive cough, sneezing, watery eyes, sinus congestion, stuffy nose. Pt reports symptoms have been going on about 2-3 days. Chest hurts when coughing at times. Unable to sleep at night due to nasal congestion.  Patient denies any body aches, fever, chills, SOB, or difficulty breathing.    Review of Systems  HENT:  Positive for congestion, ear pain, sneezing and sore throat.   Respiratory:  Positive for cough.        Objective:   Physical Exam Vitals reviewed.  Constitutional:      General: She is not in acute distress.    Appearance: Normal appearance. She is normal weight. She is not ill-appearing, toxic-appearing or diaphoretic.  HENT:     Head: Normocephalic and atraumatic.     Right Ear: Tympanic membrane, ear canal and external ear normal. There is no impacted cerumen.     Left Ear: Tympanic membrane, ear canal and external ear normal. There is no impacted cerumen.     Nose: Nose normal. No congestion or rhinorrhea.     Mouth/Throat:     Mouth: Mucous membranes are moist.     Pharynx: Oropharynx is clear. No oropharyngeal exudate or posterior oropharyngeal erythema.  Eyes:     Extraocular Movements: Extraocular movements intact.     Pupils: Pupils are equal, round, and reactive to light.  Cardiovascular:     Rate and Rhythm: Normal rate and regular rhythm.     Pulses: Normal pulses.     Heart sounds: Normal heart sounds. No murmur heard. Pulmonary:     Effort: Pulmonary effort is normal. No respiratory distress.     Breath sounds: Normal breath sounds. No wheezing.  Musculoskeletal:     Comments: Grossly intact  Skin:    General: Skin is warm.     Capillary Refill: Capillary refill takes less than 2 seconds.  Neurological:     Mental Status: She is alert.     Comments: Grossly intact  Psychiatric:        Mood  and Affect: Mood normal.        Behavior: Behavior normal.           Assessment & Plan:   1. Acute cough - Allergy vs viral - fexofenadine (ALLEGRA) 60 MG tablet; Take 1 tablet (60 mg total) by mouth 2 (two) times daily.  Dispense: 30 tablet; Refill: 1 - guaiFENesin (ROBITUSSIN) 100 MG/5ML liquid; Take 5 mLs by mouth every 4 (four) hours as needed for cough or to loosen phlegm.  Dispense: 120 mL; Refill: 0 - COVID-19, Flu A+B and RSV - DG Chest 2 View  - Will also r/o pneumonia - RTC if symptoms worsen or do not improve.

## 2022-07-24 LAB — COVID-19, FLU A+B AND RSV
Influenza A, NAA: NOT DETECTED
Influenza B, NAA: NOT DETECTED
RSV, NAA: NOT DETECTED
SARS-CoV-2, NAA: DETECTED — AB

## 2022-07-28 ENCOUNTER — Ambulatory Visit: Payer: Medicare HMO

## 2022-07-30 ENCOUNTER — Encounter: Payer: Self-pay | Admitting: Nurse Practitioner

## 2022-08-04 ENCOUNTER — Encounter: Payer: Medicare HMO | Attending: Family Medicine | Admitting: Nutrition

## 2022-08-04 VITALS — Ht 64.0 in | Wt 161.0 lb

## 2022-08-04 DIAGNOSIS — E782 Mixed hyperlipidemia: Secondary | ICD-10-CM | POA: Diagnosis not present

## 2022-08-04 DIAGNOSIS — I10 Essential (primary) hypertension: Secondary | ICD-10-CM | POA: Diagnosis not present

## 2022-08-04 DIAGNOSIS — E118 Type 2 diabetes mellitus with unspecified complications: Secondary | ICD-10-CM | POA: Insufficient documentation

## 2022-08-04 NOTE — Patient Instructions (Signed)
Goals  Increase fresh fruits and vegetables Avoid fried, spicy foods Talk to MD about reflux symptoms. Aim for AM blood sugars to be less than 130 and less than 150 mg at bedtime.

## 2022-08-04 NOTE — Progress Notes (Signed)
Medical Nutrition Therapy:  Appt start time: 1115  end time:  1145 Assessment:  Primary concerns today: Diabetes Type 2. PCP: Dr. Wolfgang Phoenix    Had COVID a few weeks ago. FBS: 150 mg/dl.   Last A1C 9/23 was 7.6%  Started taking Magnesium daily 400 mg per day.  Metformin 1000 mg BID Does yard work mowing yard and walks some for exercise.  Wt Readings from Last 3 Encounters:  07/09/22 162 lb 12.8 oz (73.8 kg)  05/25/22 165 lb 6 oz (75 kg)  05/06/22 164 lb 6.4 oz (74.6 kg)   Ht Readings from Last 3 Encounters:  05/25/22 '5\' 4"'$  (1.626 m)  05/06/22 '5\' 4"'$  (1.626 m)  11/09/21 '5\' 4"'$  (1.626 m)   There is no height or weight on file to calculate BMI. '@BMIFA'$ @ Facility age limit for growth %iles is 20 years. Facility age limit for growth %iles is 20 years.   Lab Results  Component Value Date   HGBA1C 7.6 (H) 06/28/2022      Latest Ref Rng & Units 06/28/2022    9:27 AM 01/25/2022    8:55 AM 08/31/2021    8:27 AM  CMP  Glucose 70 - 99 mg/dL 169  176  156   BUN 8 - 27 mg/dL '15  18  21   '$ Creatinine 0.57 - 1.00 mg/dL 0.99  0.91  0.79   Sodium 134 - 144 mmol/L 143  142  141   Potassium 3.5 - 5.2 mmol/L 3.7  3.8  3.8   Chloride 96 - 106 mmol/L 104  104  103   CO2 20 - 29 mmol/L '26  26  24   '$ Calcium 8.7 - 10.3 mg/dL 9.3  9.3  9.1   Total Protein 6.0 - 8.5 g/dL 6.4  6.0    Total Bilirubin 0.0 - 1.2 mg/dL 0.5  0.5    Alkaline Phos 44 - 121 IU/L 64  62    AST 0 - 40 IU/L 15  16    ALT 0 - 32 IU/L 16  16       Preferred Learning Style:  No preference indicated   Learning Readiness:   Ready Change in progress   MEDICATIONS: see list   DIETARY INTAKE:   24-hr recall:  B) Oatmeal, 1 slice bacon and croissant L) pintos and fried chicken, water  D) Turnip greens, cornbread, water  Usual physical activity: walks,  Estimated energy needs: 1500  calories 170 g carbohydrates 112 g protein 42g fat  Progress Towards Goal(s):  In progress.Goals Nutritional Diagnosis:   NB-1.1 Food and nutrition-related knowledge deficit As related to Diabetes Type 2.  As evidenced by A1C .    Intervention:  Nutrition and Diabetes education provided on My Plate, CHO counting, meal planning, portion sizes, timing of meals, avoiding snacks between meals unless having a low blood sugar, target ranges for A1C and blood sugars, signs/symptoms and treatment of hyper/hypoglycemia, monitoring blood sugars, taking medications as prescribed, benefits of exercising 30 minutes per day and prevention of complications of DM. Marland Kitchen Goals Avoid fried foods Increase fresh fruits and vegetables Avoid fried, spicy foods Talk to MD about reflux symptoms. Aim for AM blood sugars to be less than 130 and less than 150 mg at bedtime.   Teaching Method Utilized:   Auditory  Handouts given during visit include: Verbally went over Plate Method, CHO counting and meal planning   Barriers to learning/adherence to lifestyle change: none  Demonstrated degree of understanding via:  Teach Back  Monitoring/Evaluation:  Dietary intake, exercise, , and body weight in 3 month(s).

## 2022-08-06 ENCOUNTER — Telehealth: Payer: Self-pay | Admitting: Family Medicine

## 2022-08-06 NOTE — Telephone Encounter (Signed)
Patient having issues with acid reflux. She dont think she giving it enough time for her food to digest before she lays down. [facility]

## 2022-08-06 NOTE — Telephone Encounter (Signed)
Patient states she has been having some acid reflux came back up hours after meals , she states is waiting hours before laying down and propping up some, patient also noticed her tongue is dark in color. Patient advised appt may be needed, pt states she was just here not too long ago, please advise

## 2022-08-07 NOTE — Telephone Encounter (Signed)
1.  Patient is currently on pantoprazole so multiple things She needs to minimize tomato based products caffeine's chocolates if she is consuming these Small meals frequently is the better approach rather than eating 1 big meal at 1 time Avoid eating a meal within 2 hours of bedtime There could be other issues going on it would be wise for her to do a follow-up visit somewhere in the next several weeks if she does not improve with following and doing the above suggestions We can add famotidine 20 mg 1 each evening #30 with 2 refills

## 2022-08-09 MED ORDER — FAMOTIDINE 20 MG PO TABS: ORAL_TABLET | ORAL | 2 refills | Status: DC

## 2022-08-09 NOTE — Telephone Encounter (Signed)
Pt contacted and verbalized understanding. Famotidine sent to pharmacy

## 2022-08-12 DIAGNOSIS — H401111 Primary open-angle glaucoma, right eye, mild stage: Secondary | ICD-10-CM | POA: Diagnosis not present

## 2022-08-12 DIAGNOSIS — H34832 Tributary (branch) retinal vein occlusion, left eye, with macular edema: Secondary | ICD-10-CM | POA: Diagnosis not present

## 2022-08-12 DIAGNOSIS — H401123 Primary open-angle glaucoma, left eye, severe stage: Secondary | ICD-10-CM | POA: Diagnosis not present

## 2022-08-12 DIAGNOSIS — E113299 Type 2 diabetes mellitus with mild nonproliferative diabetic retinopathy without macular edema, unspecified eye: Secondary | ICD-10-CM | POA: Diagnosis not present

## 2022-08-12 DIAGNOSIS — H25813 Combined forms of age-related cataract, bilateral: Secondary | ICD-10-CM | POA: Diagnosis not present

## 2022-08-16 ENCOUNTER — Other Ambulatory Visit: Payer: Self-pay | Admitting: Family Medicine

## 2022-08-25 ENCOUNTER — Other Ambulatory Visit (HOSPITAL_COMMUNITY): Payer: Self-pay | Admitting: Family Medicine

## 2022-08-25 DIAGNOSIS — Z1231 Encounter for screening mammogram for malignant neoplasm of breast: Secondary | ICD-10-CM

## 2022-08-27 ENCOUNTER — Telehealth: Payer: Self-pay | Admitting: Family Medicine

## 2022-08-27 ENCOUNTER — Other Ambulatory Visit: Payer: Self-pay | Admitting: Nurse Practitioner

## 2022-08-27 MED ORDER — VENTOLIN HFA 108 (90 BASE) MCG/ACT IN AERS: 2.0000 | INHALATION_SPRAY | RESPIRATORY_TRACT | 0 refills | Status: DC | PRN

## 2022-08-27 MED ORDER — FLUTICASONE PROPIONATE 50 MCG/ACT NA SUSP: NASAL | 1 refills | Status: DC

## 2022-08-27 NOTE — Telephone Encounter (Signed)
rfmPatient is requesting refills onVENTOLIN HFA 108 (90 Base) MCG/ACT inhaler   fluticasone (FLONASE) 50 MCG/ACT nasal spray sent to [facility] mail order.

## 2022-08-27 NOTE — Telephone Encounter (Signed)
Done

## 2022-08-31 ENCOUNTER — Encounter: Payer: Self-pay | Admitting: Nutrition

## 2022-09-06 ENCOUNTER — Ambulatory Visit: Payer: Medicare HMO | Admitting: Family Medicine

## 2022-09-16 ENCOUNTER — Telehealth: Payer: Self-pay

## 2022-09-16 NOTE — Telephone Encounter (Signed)
Caller name: IOLANDA FOLSON  On DPR?: Yes  Call back number: [phone] (mobile)  Provider they see: Kathyrn Drown, MD  Reason for call:Dr Nicki Reaper pt lost her DMV form and needs new one filled out placed in your folder

## 2022-09-16 NOTE — Telephone Encounter (Signed)
Please advise. Thank you

## 2022-09-19 NOTE — Telephone Encounter (Signed)
Form was filled out thank you 

## 2022-09-27 ENCOUNTER — Other Ambulatory Visit: Payer: Self-pay | Admitting: Nurse Practitioner

## 2022-09-27 DIAGNOSIS — H401123 Primary open-angle glaucoma, left eye, severe stage: Secondary | ICD-10-CM | POA: Diagnosis not present

## 2022-09-27 DIAGNOSIS — H401111 Primary open-angle glaucoma, right eye, mild stage: Secondary | ICD-10-CM | POA: Diagnosis not present

## 2022-10-06 ENCOUNTER — Ambulatory Visit (HOSPITAL_COMMUNITY): Payer: Medicare HMO

## 2022-10-13 ENCOUNTER — Telehealth: Payer: Self-pay

## 2022-10-13 ENCOUNTER — Other Ambulatory Visit: Payer: Self-pay | Admitting: Family Medicine

## 2022-10-13 ENCOUNTER — Other Ambulatory Visit: Payer: Self-pay

## 2022-10-13 MED ORDER — FLUCONAZOLE 150 MG PO TABS: ORAL_TABLET | ORAL | 0 refills | Status: DC

## 2022-10-13 NOTE — Telephone Encounter (Signed)
Patient returned call, for Diflucan refill and patient denies pelvic pain, bleeding, or fever. Per office protocol prescription for Diflucan was sent to [facility].

## 2022-10-13 NOTE — Telephone Encounter (Signed)
Left message for patient to call office.  

## 2022-10-18 ENCOUNTER — Ambulatory Visit (HOSPITAL_COMMUNITY)
Admission: RE | Admit: 2022-10-18 | Discharge: 2022-10-18 | Disposition: A | Discharge status: Home / Self Care | Payer: Medicare HMO | Source: Ambulatory Visit | Attending: Family Medicine | Admitting: Family Medicine

## 2022-10-18 DIAGNOSIS — Z1231 Encounter for screening mammogram for malignant neoplasm of breast: Secondary | ICD-10-CM

## 2022-10-19 ENCOUNTER — Encounter: Payer: Self-pay | Admitting: Family Medicine

## 2022-10-19 ENCOUNTER — Ambulatory Visit (INDEPENDENT_AMBULATORY_CARE_PROVIDER_SITE_OTHER): Payer: Medicare HMO | Admitting: Family Medicine

## 2022-10-19 VITALS — BP 124/72 | Ht 64.0 in | Wt 161.0 lb

## 2022-10-19 DIAGNOSIS — E1159 Type 2 diabetes mellitus with other circulatory complications: Secondary | ICD-10-CM | POA: Diagnosis not present

## 2022-10-19 DIAGNOSIS — I70219 Atherosclerosis of native arteries of extremities with intermittent claudication, unspecified extremity: Secondary | ICD-10-CM | POA: Diagnosis not present

## 2022-10-19 DIAGNOSIS — Z0000 Encounter for general adult medical examination without abnormal findings: Secondary | ICD-10-CM | POA: Diagnosis not present

## 2022-10-19 DIAGNOSIS — I10 Essential (primary) hypertension: Secondary | ICD-10-CM | POA: Diagnosis not present

## 2022-10-19 DIAGNOSIS — Z0001 Encounter for general adult medical examination with abnormal findings: Secondary | ICD-10-CM | POA: Diagnosis not present

## 2022-10-19 MED ORDER — METFORMIN HCL 500 MG PO TABS: ORAL_TABLET | ORAL | 1 refills | Status: DC

## 2022-10-19 MED ORDER — ATORVASTATIN CALCIUM 80 MG PO TABS: ORAL_TABLET | ORAL | 1 refills | Status: DC

## 2022-10-19 MED ORDER — DOXAZOSIN MESYLATE 4 MG PO TABS: 4.0000 mg | ORAL_TABLET | Freq: Every day | ORAL | 1 refills | Status: DC

## 2022-10-19 MED ORDER — AMLODIPINE BESYLATE 10 MG PO TABS: 10.0000 mg | ORAL_TABLET | Freq: Every day | ORAL | 0 refills | Status: DC

## 2022-10-19 MED ORDER — CLOPIDOGREL BISULFATE 75 MG PO TABS: 75.0000 mg | ORAL_TABLET | Freq: Every day | ORAL | 1 refills | Status: DC

## 2022-10-19 MED ORDER — TRIAMTERENE-HCTZ 37.5-25 MG PO TABS: 1.0000 | ORAL_TABLET | Freq: Every day | ORAL | 0 refills | Status: DC

## 2022-10-19 MED ORDER — PANTOPRAZOLE SODIUM 40 MG PO TBEC: DELAYED_RELEASE_TABLET | ORAL | 1 refills | Status: DC

## 2022-10-19 NOTE — Progress Notes (Signed)
Subjective:    Patient ID: MADINE SARR, female    DOB: [birth year], 77 y.o.   MRN: [id number]  HPI AWV- Annual Wellness Visit  The patient was seen for their annual wellness visit. The patient's past medical history, surgical history, and family history were reviewed. Pertinent vaccines were reviewed ( tetanus, pneumonia, shingles, flu) The patient's medication list was reviewed and updated.  The height and weight were entered.  BMI recorded in electronic record elsewhere  Cognitive screening was completed. Outcome of Mini - Cog: Pass   Falls /depression screening electronically recorded within record elsewhere  Current tobacco usage: none (All patients who use tobacco were given written and verbal information on quitting)  Recent listing of emergency department/hospitalizations over the past year were reviewed.  current specialist the patient sees on a regular basis:, vascular surgeon, Ophthalmology    Medicare annual wellness visit patient questionnaire was reviewed.  A written screening schedule for the patient for the next 5-10 years was given. Appropriate discussion of followup regarding next visit was discussed.      Review of Systems     Objective:   Physical Exam General-in no acute distress Eyes-no discharge Lungs-respiratory rate normal, CTA CV-no murmurs,RRR Extremities skin warm dry no edema Neuro grossly normal Behavior normal, alert        Assessment & Plan:  1. Type 2 diabetes mellitus with other circulatory complication, without long-term current use of insulin ([place]) The patient was seen today as part of a comprehensive visit for diabetes. The importance of keeping her A1c at or below 7 range was discussed.  Discussed diet, activity, and medication compliance Emphasized healthy eating primarily with vegetables fruits and if utilizing meats lean meats such as chicken or fish grilled baked broiled Avoid sugary drinks Minimize and avoid  processed foods Fit in regular physical activity preferably 25 to 30 minutes 4 times per week Standard follow-up visit recommended.  Patient aware lack of control and follow-up increases risk of diabetic complications. Regular follow-up visits Yearly ophthalmology Yearly foot exam  - Hemoglobin A1c - Microalbumin/Creatinine Ratio, Urine - Basic metabolic panel - Hepatitis C Antibody  2. Atherosclerotic peripheral vascular disease with intermittent claudication (HCC) Hyperlipidemia-importance of diet, weight control, activity, compliance with medications discussed.   Recent labs reviewed.   Any additional labs or refills ordered.   Importance of keeping under good control discussed. Regular follow-up visits discussed  - amLODipine (NORVASC) 10 MG tablet; Take 1 tablet (10 mg total) by mouth daily.  Dispense: 90 tablet; Refill: 0 - doxazosin (CARDURA) 4 MG tablet; Take 1 tablet (4 mg total) by mouth at bedtime.  Dispense: 90 tablet; Refill: 1 - Hemoglobin A1c - Microalbumin/Creatinine Ratio, Urine - Basic metabolic panel - Hepatitis C Antibody  3. Hypertension, unspecified type HTN- patient seen for follow-up regarding HTN.   Diet, medication compliance, appropriate labs and refills were completed.   Importance of keeping blood pressure under good control to lessen the risk of complications discussed Regular follow-up visits discussed  - amLODipine (NORVASC) 10 MG tablet; Take 1 tablet (10 mg total) by mouth daily.  Dispense: 90 tablet; Refill: 0 - doxazosin (CARDURA) 4 MG tablet; Take 1 tablet (4 mg total) by mouth at bedtime.  Dispense: 90 tablet; Refill: 1 - Hemoglobin A1c - Microalbumin/Creatinine Ratio, Urine - Basic metabolic panel - Hepatitis C Antibody  4. Encounter for subsequent annual wellness visit (AWV) in Medicare patient AWV completed.  Healthy diet recommended.  Immunizations reviewed Lab work ordered Follow-up every  4 to 6 months for chronic health  issues Questions answered - Hemoglobin A1c - Microalbumin/Creatinine Ratio, Urine - Basic metabolic panel - Hepatitis C Antibody  Adult wellness-complete.wellness physical was conducted today. Importance of diet and exercise were discussed in detail.  Importance of stress reduction and healthy living were discussed.  In addition to this a discussion regarding safety was also covered.  We also reviewed over immunizations and gave recommendations regarding current immunization needed for age.   In addition to this additional areas were also touched on including: Preventative health exams needed:  Colonoscopy not indicated currently last 1 completed was 2019  Patient was advised yearly wellness exam

## 2022-10-20 LAB — BASIC METABOLIC PANEL
BUN/Creatinine Ratio: 14 (ref 12–28)
BUN: 14 mg/dL (ref 8–27)
CO2: 23 mmol/L (ref 20–29)
Calcium: 9.6 mg/dL (ref 8.7–10.3)
Chloride: 101 mmol/L (ref 96–106)
Creatinine, Ser: 0.97 mg/dL (ref 0.57–1.00)
Glucose: 134 mg/dL — ABNORMAL HIGH (ref 70–99)
Potassium: 3.8 mmol/L (ref 3.5–5.2)
Sodium: 140 mmol/L (ref 134–144)
eGFR: 60 mL/min/{1.73_m2} (ref >59)

## 2022-10-20 LAB — HEPATITIS C ANTIBODY: Hep C Virus Ab: NONREACTIVE

## 2022-10-20 LAB — MICROALBUMIN / CREATININE URINE RATIO
Creatinine, Urine: 48 mg/dL
Microalb/Creat Ratio: 203 mg/g creat — ABNORMAL HIGH (ref 0–29)
Microalbumin, Urine: 97.2 ug/mL

## 2022-10-20 LAB — HEMOGLOBIN A1C
Est. average glucose Bld gHb Est-mCnc: 151 mg/dL
Hgb A1c MFr Bld: 6.9 % — ABNORMAL HIGH (ref 4.8–5.6)

## 2022-10-30 ENCOUNTER — Other Ambulatory Visit: Payer: Self-pay | Admitting: Family Medicine

## 2022-11-05 DIAGNOSIS — E113299 Type 2 diabetes mellitus with mild nonproliferative diabetic retinopathy without macular edema, unspecified eye: Secondary | ICD-10-CM | POA: Diagnosis not present

## 2022-11-05 DIAGNOSIS — H25813 Combined forms of age-related cataract, bilateral: Secondary | ICD-10-CM | POA: Diagnosis not present

## 2022-11-05 DIAGNOSIS — H401123 Primary open-angle glaucoma, left eye, severe stage: Secondary | ICD-10-CM | POA: Diagnosis not present

## 2022-11-05 DIAGNOSIS — H34832 Tributary (branch) retinal vein occlusion, left eye, with macular edema: Secondary | ICD-10-CM | POA: Diagnosis not present

## 2022-11-05 DIAGNOSIS — H401111 Primary open-angle glaucoma, right eye, mild stage: Secondary | ICD-10-CM | POA: Diagnosis not present

## 2022-11-08 ENCOUNTER — Ambulatory Visit: Payer: Medicare HMO | Admitting: Family Medicine

## 2022-11-17 ENCOUNTER — Ambulatory Visit: Payer: Medicare HMO | Admitting: Nutrition

## 2022-11-17 ENCOUNTER — Telehealth: Payer: Self-pay | Admitting: Nutrition

## 2022-11-17 NOTE — Telephone Encounter (Signed)
TC to patient has she  had missed her appt today. Reviewed her labs. Congratulated her on her improved A1C down to 6.8%. She notes her FBS yesterday was 118 mg/dl. Encouraged her to continue to focus on more plant based foods from a  garden,  try changing up her white potaoes to sweet potatoes as desired, increasing walking in the house and avoid processed foods of bacon, sausage and junk foods. She verbalized understanding. Will follow up in June after her appt with Dr. Nicki Reaper. Next appt is 03-23-23 at 11 am with me.

## 2022-12-14 ENCOUNTER — Other Ambulatory Visit: Payer: Self-pay

## 2022-12-14 DIAGNOSIS — I6523 Occlusion and stenosis of bilateral carotid arteries: Secondary | ICD-10-CM

## 2023-01-03 ENCOUNTER — Ambulatory Visit (HOSPITAL_COMMUNITY)
Admission: RE | Admit: 2023-01-03 | Discharge: 2023-01-03 | Disposition: A | Discharge status: Home / Self Care | Payer: Medicare HMO | Source: Ambulatory Visit | Attending: Surgery | Admitting: Surgery

## 2023-01-03 ENCOUNTER — Other Ambulatory Visit: Payer: Self-pay | Admitting: Family Medicine

## 2023-01-03 ENCOUNTER — Ambulatory Visit (INDEPENDENT_AMBULATORY_CARE_PROVIDER_SITE_OTHER): Payer: Medicare HMO | Admitting: Physician Assistant

## 2023-01-03 VITALS — BP 161/84 | HR 61 | Temp 97.2°F | Resp 16 | Ht 64.0 in | Wt 158.0 lb

## 2023-01-03 DIAGNOSIS — I6523 Occlusion and stenosis of bilateral carotid arteries: Secondary | ICD-10-CM

## 2023-01-03 DIAGNOSIS — I10 Essential (primary) hypertension: Secondary | ICD-10-CM

## 2023-01-03 DIAGNOSIS — I70219 Atherosclerosis of native arteries of extremities with intermittent claudication, unspecified extremity: Secondary | ICD-10-CM

## 2023-01-06 ENCOUNTER — Other Ambulatory Visit: Payer: Self-pay | Admitting: Family Medicine

## 2023-01-06 NOTE — Progress Notes (Signed)
Office Note   History of Present Illness   Kara Kirby is a 77 y.o. ([birth year]) female who presents for surveillance of carotid artery stenosis.  She has a history of right carotid endarterectomy by Dr. Scot Dock in 2004 for symptomatic stenosis.  She subsequently required transfemoral right internal carotid artery stenting due to recurrent asymptomatic stenosis by Dr. Oneida Alar in 2017.  She has some residual left leg weakness since her stroke in 2004.  She returns today for follow-up.  She denies any strokelike symptoms such as slurred speech, facial droop, sudden change in vision, or sudden weakness/numbness.  She also denies any claudication, rest pain, nonhealing wounds of the lower extremities.  Current Outpatient Medications  Medication Sig Dispense Refill   acetaminophen (TYLENOL) 500 MG tablet Take 500-1,000 mg by mouth 2 (two) times daily as needed for mild pain.     aspirin 81 MG tablet Take 81 mg by mouth daily.     atorvastatin (LIPITOR) 80 MG tablet 1 qd 90 tablet 1   blood glucose meter kit and supplies Dispense based on patient and insurance preference. Use to test glucose once daily (FOR ICD-10 E11.9). 1 each 0   clopidogrel (PLAVIX) 75 MG tablet Take 1 tablet (75 mg total) by mouth daily. 90 tablet 1   dorzolamide-timolol (COSOPT) 2-0.5 % ophthalmic solution Place 1 drop into both eyes 2 times daily.     doxazosin (CARDURA) 4 MG tablet Take 1 tablet (4 mg total) by mouth at bedtime. 90 tablet 1   famotidine (PEPCID) 20 MG tablet Take one tablet po each evening 30 tablet 2   fexofenadine (ALLEGRA ODT) 30 MG disintegrating tablet Take by mouth.     fexofenadine (ALLEGRA) 60 MG tablet Take 1 tablet (60 mg total) by mouth 2 (two) times daily. 30 tablet 1   fluticasone (FLONASE) 50 MCG/ACT nasal spray USE 2 SPRAYS IN EACH NOSTRIL ONE TIME DAILY 48 g 1   guaiFENesin (ROBITUSSIN) 100 MG/5ML liquid Take 5 mLs by mouth every 4 (four) hours as needed for cough or to loosen  phlegm. 120 mL 0   latanoprost (XALATAN) 0.005 % ophthalmic solution Place 1 drop into both eyes at bedtime.     metFORMIN (GLUCOPHAGE) 500 MG tablet 2 tablets twice daily 360 tablet 1   Multiple Vitamin (MULTIVITAMIN) tablet Take 1 tablet by mouth daily.     nitroGLYCERIN (NITROSTAT) 0.4 MG SL tablet DISSOLVE ONE TABLET UNDER THE TONGUE EVERY 5 MINUTES AS NEEDED FOR CHEST PAIN.  DO NOT EXCEED A TOTAL OF 3 DOSES IN 15 MINUTES (Patient taking differently: Place 0.4 mg under the tongue every 5 (five) minutes as needed for chest pain. DISSOLVE ONE TABLET UNDER THE TONGUE EVERY 5 MINUTES AS NEEDED FOR CHEST PAIN.  DO NOT EXCEED A TOTAL OF 3 DOSES IN 15 MINUTES) 25 tablet 0   olopatadine (PATANOL) 0.1 % ophthalmic solution Place 1 drop into both eyes 2 (two) times daily. 15 mL 4   ONETOUCH ULTRA test strip USE 1 STRIP TO CHECK GLUCOSE ONCE DAILY 100 each 0   OVER THE COUNTER MEDICATION Magnesium 400mg  one twice a day     pantoprazole (PROTONIX) 40 MG tablet TAKE 1 TABLET EVERY DAY . STOP FAMOTIDINE 90 tablet 1   traMADol (ULTRAM) 50 MG tablet Take 1 tablet (50 mg total) by mouth every 8 (eight) hours as needed for moderate pain or severe pain. 15 tablet 0   triamterene-hydrochlorothiazide (MAXZIDE-25) 37.5-25 MG tablet Take 1 tablet by mouth  daily. 90 tablet 0   VENTOLIN HFA 108 (90 Base) MCG/ACT inhaler INHALE 2 PUFFS INTO THE LUNGS EVERY 4 (FOUR) HOURS AS NEEDED. 36 g 3   zolpidem (AMBIEN) 10 MG tablet Take by mouth.     amLODipine (NORVASC) 10 MG tablet TAKE 1 TABLET EVERY DAY 90 tablet 0   No current facility-administered medications for this visit.    REVIEW OF SYSTEMS (negative unless checked):   Cardiac:  []  Chest pain or chest pressure? []  Shortness of breath upon activity? []  Shortness of breath when lying flat? []  Irregular heart rhythm?  Vascular:  []  Pain in calf, thigh, or hip brought on by walking? []  Pain in feet at night that wakes you up from your sleep? []  Blood clot in your  veins? []  Leg swelling?  Pulmonary:  []  Oxygen at home? []  Productive cough? []  Wheezing?  Neurologic:  []  Sudden weakness in arms or legs? []  Sudden numbness in arms or legs? []  Sudden onset of difficult speaking or slurred speech? []  Temporary loss of vision in one eye? []  Problems with dizziness?  Gastrointestinal:  []  Blood in stool? []  Vomited blood?  Genitourinary:  []  Burning when urinating? []  Blood in urine?  Psychiatric:  []  Major depression  Hematologic:  []  Bleeding problems? []  Problems with blood clotting?  Dermatologic:  []  Rashes or ulcers?  Constitutional:  []  Fever or chills?  Ear/Nose/Throat:  []  Change in hearing? []  Nose bleeds? []  Sore throat?  Musculoskeletal:  []  Back pain? []  Joint pain? []  Muscle pain?   Physical Examination   Vitals:   01/03/23 1343 01/03/23 1346  BP: (!) 174/81 (!) 161/84  Pulse: 61 61  Resp: 16   Temp: (!) 97.2 F (36.2 C)   TempSrc: Temporal   SpO2: 98%   Weight: 158 lb (71.7 kg)   Height: 5\' 4"  (1.626 m)   Body mass index is 27.12 kg/m.  General:  WDWN in NAD; vital signs documented above Gait: Not observed HENT: WNL, normocephalic Pulmonary: normal non-labored breathing  Cardiac: Regular Abdomen: soft, NT, no masses Skin: without rashes Vascular Exam/Pulses: 2+ radial and pedal pulses Extremities: without ischemic changes, without Gangrene , without cellulitis; without open wounds;  Musculoskeletal: no muscle wasting or atrophy  Neurologic: A&O X 3;  No focal weakness or paresthesias are detected Psychiatric:  The pt has Normal affect.  Non-Invasive Vascular Imaging   B Carotid Duplex (01/03/2023):  R ICA stenosis: Patent stent without stenosis R VA:  patent and antegrade L ICA stenosis:  40-59% L VA:  patent and antegrade   Medical Decision Making   Kara Kirby is a 77 y.o. female who presents for surveillance of carotid artery stenosis  Based on the patient's vascular  studies, her carotid artery stenosis is unchanged bilaterally.  Her left ICA stenosis is 40 to 59%.  Patent right ICA stent without stenosis She denies any strokelike symptoms.  She also denies any claudication, rest pain, nonhealing wounds of the lower extremities She has palpable radial and pedal pulses She will continue aspirin and statin.  She can follow-up with our office in 1 year with repeat carotid studies   Vicente Serene PA-C [facility] Office: [facility] MD: Trula Slade

## 2023-01-10 ENCOUNTER — Other Ambulatory Visit: Payer: Self-pay | Admitting: *Deleted

## 2023-01-10 ENCOUNTER — Other Ambulatory Visit: Payer: Self-pay | Admitting: Family Medicine

## 2023-01-10 MED ORDER — FAMOTIDINE 20 MG PO TABS: ORAL_TABLET | ORAL | 0 refills | Status: DC

## 2023-02-10 DIAGNOSIS — H401111 Primary open-angle glaucoma, right eye, mild stage: Secondary | ICD-10-CM | POA: Diagnosis not present

## 2023-02-10 DIAGNOSIS — H34832 Tributary (branch) retinal vein occlusion, left eye, with macular edema: Secondary | ICD-10-CM | POA: Diagnosis not present

## 2023-02-10 DIAGNOSIS — H25813 Combined forms of age-related cataract, bilateral: Secondary | ICD-10-CM | POA: Diagnosis not present

## 2023-02-10 DIAGNOSIS — E113293 Type 2 diabetes mellitus with mild nonproliferative diabetic retinopathy without macular edema, bilateral: Secondary | ICD-10-CM | POA: Diagnosis not present

## 2023-02-10 DIAGNOSIS — Z7984 Long term (current) use of oral hypoglycemic drugs: Secondary | ICD-10-CM | POA: Diagnosis not present

## 2023-02-10 DIAGNOSIS — E1136 Type 2 diabetes mellitus with diabetic cataract: Secondary | ICD-10-CM | POA: Diagnosis not present

## 2023-02-10 DIAGNOSIS — H401123 Primary open-angle glaucoma, left eye, severe stage: Secondary | ICD-10-CM | POA: Diagnosis not present

## 2023-02-12 ENCOUNTER — Other Ambulatory Visit: Payer: Self-pay | Admitting: Nurse Practitioner

## 2023-02-28 DIAGNOSIS — H401123 Primary open-angle glaucoma, left eye, severe stage: Secondary | ICD-10-CM | POA: Diagnosis not present

## 2023-02-28 DIAGNOSIS — H401111 Primary open-angle glaucoma, right eye, mild stage: Secondary | ICD-10-CM | POA: Diagnosis not present

## 2023-03-10 ENCOUNTER — Ambulatory Visit
Admission: EM | Admit: 2023-03-10 | Discharge: 2023-03-10 | Disposition: A | Discharge status: Home / Self Care | Payer: Medicare HMO | Attending: Nurse Practitioner | Admitting: Nurse Practitioner

## 2023-03-10 DIAGNOSIS — S91001A Unspecified open wound, right ankle, initial encounter: Secondary | ICD-10-CM | POA: Diagnosis not present

## 2023-03-10 DIAGNOSIS — W57XXXA Bitten or stung by nonvenomous insect and other nonvenomous arthropods, initial encounter: Secondary | ICD-10-CM | POA: Diagnosis not present

## 2023-03-10 DIAGNOSIS — S90561A Insect bite (nonvenomous), right ankle, initial encounter: Secondary | ICD-10-CM

## 2023-03-10 MED ORDER — DOXYCYCLINE HYCLATE 100 MG PO TABS: 100.0000 mg | ORAL_TABLET | Freq: Two times a day (BID) | ORAL | 0 refills | Status: AC

## 2023-03-10 NOTE — Discharge Instructions (Signed)
Take medication as prescribed. May take over-the-counter Tylenol as needed for pain, fever, general discomfort. Keep the dressing in place today, change your dressing tomorrow. Cleanse the area at least twice daily with an antibacterial soap such as Dial Gold bar soap. When you are out in public, please keep the area covered with a bandage or dressing.  When you are home, may leave the area open to air to promote healing. May apply ice to the right ankle to help with swelling or pain as needed.  Apply for 20 minutes, remove for 1 hour, then repeat as needed. Monitor the area for worsening symptoms.  This includes increased redness, swelling, foul-smelling drainage, or if you develop fever or chills.  If you notice any of the symptoms, please follow-up in this clinic or in the emergency department for further evaluation. Have your primary care physician reevaluate the wound at your upcoming appointment. Follow-up as needed.

## 2023-03-10 NOTE — ED Provider Notes (Signed)
[facility]    CSN: [id number] Arrival date & time: 03/10/23  1204      History   Chief Complaint Chief Complaint  Patient presents with   Insect Bite    HPI Kara Kirby is a 77 y.o. female.   The history is provided by the patient.   The patient reports with a possible insect bite to the back of the right ankle has been present for the past 2 days.  Patient states that the area became itchy, and later developed a "blister".  She states that she did "pop" the blister and it drained.  She states at 1 point, it looked like there was a hole in the ankle.  She states that she has not had fever, chills, chest pain, abdominal pain, nausea, vomiting, diarrhea, swelling in the right ankle, foul-smelling drainage, numbness, tingling, radiation of pain.  Patient states that she was concerned that it may have been a brown recluse spider bite.  She reports that she does have a history of diabetes.  Past Medical History:  Diagnosis Date   Allergy    Arthritis    Left shoulder   Carotid artery occlusion    CVA (cerebral vascular accident) (HCC) 02-19-13   DM (diabetes mellitus) (HCC)    Dyslipidemia    GERD (gastroesophageal reflux disease)    intermittent Sx   Hematuria    Hip arthritis 2003   Left   HTN (hypertension)    Hyperlipidemia    Renal insufficiency, mild    Sleep related leg cramps     Patient Active Problem List   Diagnosis Date Noted   Chronic pain of both shoulders 05/06/2022   Aortic atherosclerosis (HCC) 09/08/2020   Primary open angle glaucoma (POAG) of left eye, severe stage 09/07/2019   Rectal bleeding 06/21/2018   Primary open angle glaucoma (POAG) of right eye, mild stage 04/09/2018   Glaucoma 11/07/2017   Nuclear age-related cataract, both eyes 01/25/2017   Diabetes mellitus without complication (HCC) 01/25/2017   Ischemic optic neuritis of left eye 09/23/2016   Carotid stenosis 10/17/2015   Insomnia 06/03/2014   Atherosclerotic  peripheral vascular disease with intermittent claudication (HCC) 02/01/2014   Aftercare following surgery of the circulatory system, NEC 05/23/2013   Type 2 diabetes mellitus with mild nonproliferative diabetic retinopathy without macular edema, unspecified eye (HCC) 04/19/2013   H/O acute sinusitis 03/02/2013   HTN (hypertension) 02/19/2013   Diabetes (HCC) 02/19/2013   Hyperlipidemia associated with type 2 diabetes mellitus (HCC) 02/19/2013   Stroke (HCC) 02/18/2013   Occlusion and stenosis of carotid artery without mention of cerebral infarction 01/10/2013   Combined forms of age-related cataract 03/30/2012   Diabetes mellitus, type 2 (HCC) 10/28/2011   Branch retinal vein occlusion with macular edema of left eye 07/22/2011   Macular edema 07/22/2011    Past Surgical History:  Procedure Laterality Date   CAROTID ENDARTERECTOMY  RIGHT   COLONOSCOPY     COLONOSCOPY  10/19/2011   Procedure: COLONOSCOPY;  Surgeon: Arlyce Harman, MD;  Location: [facility];  Service: Endoscopy;  Laterality: N/A;  10:15   COLONOSCOPY N/A 08/16/2018   Procedure: COLONOSCOPY;  Surgeon: West Bali, MD;  Location: [facility];  Service: Endoscopy;  Laterality: N/A;  9:30am   PERIPHERAL VASCULAR CATHETERIZATION Right 10/17/2015   Procedure: Carotid PTA/Stent Intervention;  Surgeon: Sherren Kerns, MD;  Location: [facility] INVASIVE CV LAB;  Service: Cardiovascular;  Laterality: Right;   PERIPHERAL VASCULAR CATHETERIZATION Right 10/17/2015  Procedure: Carotid Angiography;  Surgeon: Sherren Kerns, MD;  Location: [facility] INVASIVE CV LAB;  Service: Cardiovascular;  Laterality: Right;   S/P Hysterectomy     TOTAL ABDOMINAL HYSTERECTOMY     FIBROID, HYPERMENORRHEA    OB History   No obstetric history on file.      Home Medications    Prior to Admission medications   Medication Sig Start Date End Date Taking? Authorizing Provider  doxycycline (VIBRA-TABS) 100 MG tablet Take 1 tablet (100 mg total) by mouth 2  (two) times daily for 5 days. 03/10/23 03/15/23 Yes Shellee Streng-Warren, Sadie Haber, NP  acetaminophen (TYLENOL) 500 MG tablet Take 500-1,000 mg by mouth 2 (two) times daily as needed for mild pain.    [provider]  amLODipine (NORVASC) 10 MG tablet TAKE 1 TABLET EVERY DAY 01/05/23   Babs Sciara, MD  aspirin 81 MG tablet Take 81 mg by mouth daily.    [provider]  atorvastatin (LIPITOR) 80 MG tablet 1 qd 10/19/22   Babs Sciara, MD  blood glucose meter kit and supplies Dispense based on patient and insurance preference. Use to test glucose once daily (FOR ICD-10 E11.9). 04/12/19   Babs Sciara, MD  clopidogrel (PLAVIX) 75 MG tablet Take 1 tablet (75 mg total) by mouth daily. 10/19/22   Babs Sciara, MD  dorzolamide-timolol (COSOPT) 2-0.5 % ophthalmic solution Place 1 drop into both eyes 2 times daily. 02/17/22   [provider]  doxazosin (CARDURA) 4 MG tablet Take 1 tablet (4 mg total) by mouth at bedtime. 10/19/22   Babs Sciara, MD  famotidine (PEPCID) 20 MG tablet Take one tablet po each evening 01/10/23   Luking, Jonna Coup, MD  fexofenadine (ALLEGRA ODT) 30 MG disintegrating tablet Take by mouth.    [provider]  fexofenadine (ALLEGRA) 60 MG tablet Take 1 tablet (60 mg total) by mouth 2 (two) times daily. 07/22/22   Ameduite, Alvino Chapel, FNP  fluticasone (FLONASE) 50 MCG/ACT nasal spray USE 2 SPRAYS IN EACH NOSTRIL ONE TIME DAILY 02/14/23   Babs Sciara, MD  guaiFENesin (ROBITUSSIN) 100 MG/5ML liquid Take 5 mLs by mouth every 4 (four) hours as needed for cough or to loosen phlegm. 07/22/22   Ameduite, Alvino Chapel, FNP  latanoprost (XALATAN) 0.005 % ophthalmic solution Place 1 drop into both eyes at bedtime.    [provider]  metFORMIN (GLUCOPHAGE) 500 MG tablet 2 tablets twice daily 10/19/22   Babs Sciara, MD  Multiple Vitamin (MULTIVITAMIN) tablet Take 1 tablet by mouth daily.    [provider]  nitroGLYCERIN (NITROSTAT) 0.4 MG SL tablet  DISSOLVE ONE TABLET UNDER THE TONGUE EVERY 5 MINUTES AS NEEDED FOR CHEST PAIN.  DO NOT EXCEED A TOTAL OF 3 DOSES IN 15 MINUTES Patient taking differently: Place 0.4 mg under the tongue every 5 (five) minutes as needed for chest pain. DISSOLVE ONE TABLET UNDER THE TONGUE EVERY 5 MINUTES AS NEEDED FOR CHEST PAIN.  DO NOT EXCEED A TOTAL OF 3 DOSES IN 15 MINUTES 02/07/18   Luking, Scott A, MD  olopatadine (PATANOL) 0.1 % ophthalmic solution Place 1 drop into both eyes 2 (two) times daily. 02/04/22   Babs Sciara, MD  ONETOUCH ULTRA test strip USE 1 STRIP TO CHECK GLUCOSE ONCE DAILY 10/13/22   Babs Sciara, MD  OVER THE COUNTER MEDICATION Magnesium 400mg  one twice a day    [provider]  pantoprazole (PROTONIX) 40 MG tablet TAKE 1  TABLET EVERY DAY . STOP FAMOTIDINE 10/19/22   Babs Sciara, MD  traMADol (ULTRAM) 50 MG tablet Take 1 tablet (50 mg total) by mouth every 8 (eight) hours as needed for moderate pain or severe pain. 05/06/22   Tommie Sams, DO  triamterene-hydrochlorothiazide (MAXZIDE-25) 37.5-25 MG tablet TAKE 1 TABLET EVERY DAY 01/17/23   Babs Sciara, MD  VENTOLIN HFA 108 (90 Base) MCG/ACT inhaler INHALE 2 PUFFS INTO THE LUNGS EVERY 4 (FOUR) HOURS AS NEEDED. 11/02/22   Babs Sciara, MD  zolpidem (AMBIEN) 10 MG tablet Take by mouth. 10/16/14   [provider]    Family History Family History  Problem Relation Age of Onset   Hypertension Mother    Heart attack Mother    Heart disease Mother    Hypertension Sister    Diabetes Sister        Amputation   Hyperlipidemia Sister    Heart attack Sister    Diabetes Sister    Hypertension Sister    Heart disease Sister        Before age 57   Heart disease Father    Colon cancer Neg Hx    Colon polyps Neg Hx     Social History Social History   Tobacco Use   Smoking status: Never   Smokeless tobacco: Never  Substance Use Topics   Alcohol use: No    Alcohol/week: 0.0 standard drinks of alcohol   Drug use: No      Allergies   Other, Iodinated contrast media, and Lisinopril   Review of Systems Review of Systems Per HPI  Physical Exam Triage Vital Signs ED Triage Vitals  Enc Vitals Group     BP 03/10/23 1216 (!) 181/82     Pulse Rate 03/10/23 1216 86     Resp 03/10/23 1216 20     Temp 03/10/23 1216 98 F (36.7 C)     Temp Source 03/10/23 1216 Oral     SpO2 03/10/23 1216 95 %     Weight --      Height --      Head Circumference --      Peak Flow --      Pain Score 03/10/23 1217 0     Pain Loc --      Pain Edu? --      Excl. in GC? --    No data found.  Updated Vital Signs BP (!) 163/78 (BP Location: Right Arm)   Pulse 86   Temp 98 F (36.7 C) (Oral)   Resp 20   SpO2 95%   Visual Acuity Right Eye Distance:   Left Eye Distance:   Bilateral Distance:    Right Eye Near:   Left Eye Near:    Bilateral Near:     Physical Exam Vitals and nursing note reviewed.  Constitutional:      General: She is not in acute distress.    Appearance: Normal appearance.  HENT:     Head: Normocephalic.  Eyes:     Extraocular Movements: Extraocular movements intact.     Pupils: Pupils are equal, round, and reactive to light.  Cardiovascular:     Rate and Rhythm: Normal rate and regular rhythm.     Pulses: Normal pulses.     Heart sounds: Normal heart sounds.  Pulmonary:     Effort: Pulmonary effort is normal.     Breath sounds: Normal breath sounds.  Abdominal:     General: Bowel sounds are  normal.     Palpations: Abdomen is soft.     Tenderness: There is no abdominal tenderness.  Musculoskeletal:     Cervical back: Normal range of motion.  Lymphadenopathy:     Cervical: No cervical adenopathy.  Skin:    General: Skin is warm and dry.     Findings: Wound (posterior right ankle) present.     Comments: See attached image  Neurological:     General: No focal deficit present.     Mental Status: She is alert and oriented to person, place, and time.  Psychiatric:        Mood  and Affect: Mood normal.        Behavior: Behavior normal.      UC Treatments / Results  Labs (all labs ordered are listed, but only abnormal results are displayed) Labs Reviewed - No data to display  EKG   Radiology No results found.  Procedures Procedures (including critical care time)  Medications Ordered in UC Medications - No data to display  Initial Impression / Assessment and Plan / UC Course  I have reviewed the triage vital signs and the nursing notes.  Pertinent labs & imaging results that were available during my care of the patient were reviewed by me and considered in my medical decision making (see chart for details).  The patient is well-appearing, she is in no acute distress, patient is hypertensive, repeat BP at discharge was 163/78. Patient denies chest pain, shortness of breath, difficulty breathing, blurred vision, dizziness, or headache.  Patient with possible insect bite to the posterior right ankle.  The area is not warm or fluctuant, there is no obvious drainage present.  Will treat patient with doxycycline 100 mg, given her underlying history of diabetes.  Supportive care recommendations were provided and discussed with the patient to include cleansing the area with an antibacterial soap, over-the-counter analgesics for pain or discomfort, and keeping the area covered.  Patient was given strict follow-up precautions.  Patient is in agreement with this plan of care and verbalizes understanding.  All questions were answered.  Patient stable for discharge.  Final Clinical Impressions(s) / UC Diagnoses   Final diagnoses:  Wound of right ankle, initial encounter  Insect bite of right ankle, initial encounter     Discharge Instructions      Take medication as prescribed. May take over-the-counter Tylenol as needed for pain, fever, general discomfort. Keep the dressing in place today, change your dressing tomorrow. Cleanse the area at least twice daily  with an antibacterial soap such as Dial Gold bar soap. When you are out in public, please keep the area covered with a bandage or dressing.  When you are home, may leave the area open to air to promote healing. May apply ice to the right ankle to help with swelling or pain as needed.  Apply for 20 minutes, remove for 1 hour, then repeat as needed. Monitor the area for worsening symptoms.  This includes increased redness, swelling, foul-smelling drainage, or if you develop fever or chills.  If you notice any of the symptoms, please follow-up in this clinic or in the emergency department for further evaluation. Have your primary care physician reevaluate the wound at your upcoming appointment. Follow-up as needed.     ED Prescriptions     Medication Sig Dispense Auth. Provider   doxycycline (VIBRA-TABS) 100 MG tablet Take 1 tablet (100 mg total) by mouth 2 (two) times daily for 5 days. 10 tablet Hamzeh Tall-Warren, Lorene Dy  J, NP      PDMP not reviewed this encounter.   Abran Cantor, NP 03/10/23 1251

## 2023-03-10 NOTE — ED Triage Notes (Signed)
Pt reports she has been bitten on the back of her right ankle x 2 days. Pt states that it is now a hole and it was draining. States she put some liquid band aid on the wound.  Didn't know she was bit it just started itching.   Pt states she stuck a pin into the bump on her ankle and busted it.

## 2023-03-21 ENCOUNTER — Ambulatory Visit (INDEPENDENT_AMBULATORY_CARE_PROVIDER_SITE_OTHER): Payer: Medicare HMO | Admitting: Family Medicine

## 2023-03-21 VITALS — BP 138/64 | HR 78 | Ht 64.0 in | Wt 160.8 lb

## 2023-03-21 DIAGNOSIS — Z7984 Long term (current) use of oral hypoglycemic drugs: Secondary | ICD-10-CM | POA: Diagnosis not present

## 2023-03-21 DIAGNOSIS — W57XXXD Bitten or stung by nonvenomous insect and other nonvenomous arthropods, subsequent encounter: Secondary | ICD-10-CM | POA: Diagnosis not present

## 2023-03-21 DIAGNOSIS — E1169 Type 2 diabetes mellitus with other specified complication: Secondary | ICD-10-CM | POA: Diagnosis not present

## 2023-03-21 DIAGNOSIS — I700 Atherosclerosis of aorta: Secondary | ICD-10-CM | POA: Diagnosis not present

## 2023-03-21 DIAGNOSIS — E785 Hyperlipidemia, unspecified: Secondary | ICD-10-CM | POA: Diagnosis not present

## 2023-03-21 DIAGNOSIS — E1159 Type 2 diabetes mellitus with other circulatory complications: Secondary | ICD-10-CM

## 2023-03-21 DIAGNOSIS — I10 Essential (primary) hypertension: Secondary | ICD-10-CM | POA: Diagnosis not present

## 2023-03-21 MED ORDER — MUPIROCIN 2 % EX OINT
TOPICAL_OINTMENT | CUTANEOUS | 1 refills | Status: DC
Start: 2023-03-21 — End: 2024-07-13

## 2023-03-21 NOTE — Progress Notes (Signed)
Subjective:    Patient ID: Kara Kirby, female    DOB: May 08, 1946, 77 y.o.   MRN: [id number]  HPI Patient arrives 5 month follow up. Patient states she had bite on back of right ankle that got infected and had to be packed. Patient states she has sore on her back that she would like Dr Lorin Picket to look at.  She relates pain discomfort with this area.  Relates some soreness but it is doing better now  The patient was seen today as part of a comprehensive diabetic check up. Patient has diabetes Patient relates good compliance with taking the medication. We discussed their diet and exercise activities  We also discussed the importance of notifying us if any excessively high glucoses or low sugars.    Patient here for follow-up regarding cholesterol.    Patient relates taking medication on a regular basis Denies problems with medication Importance of dietary measures discussed Regular lab work regarding lipid and liver was checked and if needing additional labs was appropriately ordered  Patient for blood pressure check up.  The patient does have hypertension.   Patient relates dietary measures try to minimize salt The importance of healthy diet and activity were discussed Patient relates compliance  With  Past Medical History:  Diagnosis Date   Allergy    Arthritis    Left shoulder   Carotid artery occlusion    CVA (cerebral vascular accident) (HCC) 02-19-13   DM (diabetes mellitus) (HCC)    Dyslipidemia    GERD (gastroesophageal reflux disease)    intermittent Sx   Hematuria    Hip arthritis 2003   Left   HTN (hypertension)    Hyperlipidemia    Renal insufficiency, mild    Sleep related leg cramps     Outpatient Encounter Medications as of 03/21/2023  Medication Sig Note   acetaminophen (TYLENOL) 500 MG tablet Take 500-1,000 mg by mouth 2 (two) times daily as needed for mild pain.    amLODipine (NORVASC) 10 MG tablet TAKE 1 TABLET EVERY DAY    aspirin 81 MG  tablet Take 81 mg by mouth daily.    atorvastatin (LIPITOR) 80 MG tablet 1 qd    blood glucose meter kit and supplies Dispense based on patient and insurance preference. Use to test glucose once daily (FOR ICD-10 E11.9).    clopidogrel (PLAVIX) 75 MG tablet Take 1 tablet (75 mg total) by mouth daily.    dorzolamide-timolol (COSOPT) 2-0.5 % ophthalmic solution Place 1 drop into both eyes 2 times daily.    doxazosin (CARDURA) 4 MG tablet Take 1 tablet (4 mg total) by mouth at bedtime.    famotidine (PEPCID) 20 MG tablet Take one tablet po each evening    fexofenadine (ALLEGRA ODT) 30 MG disintegrating tablet Take by mouth.    fexofenadine (ALLEGRA) 60 MG tablet Take 1 tablet (60 mg total) by mouth 2 (two) times daily.    fluticasone (FLONASE) 50 MCG/ACT nasal spray USE 2 SPRAYS IN EACH NOSTRIL ONE TIME DAILY    guaiFENesin (ROBITUSSIN) 100 MG/5ML liquid Take 5 mLs by mouth every 4 (four) hours as needed for cough or to loosen phlegm.    latanoprost (XALATAN) 0.005 % ophthalmic solution Place 1 drop into both eyes at bedtime.    metFORMIN (GLUCOPHAGE) 500 MG tablet 2 tablets twice daily    Multiple Vitamin (MULTIVITAMIN) tablet Take 1 tablet by mouth daily.    nitroGLYCERIN (NITROSTAT) 0.4 MG SL tablet DISSOLVE ONE TABLET UNDER THE TONGUE  EVERY 5 MINUTES AS NEEDED FOR CHEST PAIN.  DO NOT EXCEED A TOTAL OF 3 DOSES IN 15 MINUTES (Patient taking differently: Place 0.4 mg under the tongue every 5 (five) minutes as needed for chest pain. DISSOLVE ONE TABLET UNDER THE TONGUE EVERY 5 MINUTES AS NEEDED FOR CHEST PAIN.  DO NOT EXCEED A TOTAL OF 3 DOSES IN 15 MINUTES) 08/16/2018: Has never used.   olopatadine (PATANOL) 0.1 % ophthalmic solution Place 1 drop into both eyes 2 (two) times daily.    ONETOUCH ULTRA test strip USE 1 STRIP TO CHECK GLUCOSE ONCE DAILY    OVER THE COUNTER MEDICATION Magnesium 400mg  one twice a day    pantoprazole (PROTONIX) 40 MG tablet TAKE 1 TABLET EVERY DAY . STOP FAMOTIDINE     traMADol (ULTRAM) 50 MG tablet Take 1 tablet (50 mg total) by mouth every 8 (eight) hours as needed for moderate pain or severe pain.    triamterene-hydrochlorothiazide (MAXZIDE-25) 37.5-25 MG tablet TAKE 1 TABLET EVERY DAY    VENTOLIN HFA 108 (90 Base) MCG/ACT inhaler INHALE 2 PUFFS INTO THE LUNGS EVERY 4 (FOUR) HOURS AS NEEDED.    zolpidem (AMBIEN) 10 MG tablet Take by mouth.    No facility-administered encounter medications on file as of 03/21/2023.     Review of Systems     Objective:   Physical Exam  General-in no acute distress Eyes-no discharge Lungs-respiratory rate normal, CTA CV-no murmurs,RRR Extremities skin warm dry no edema Neuro grossly normal Behavior normal, alert The area behind the ankle is sore at open but no abscess noted culture was taken      Assessment & Plan:  1. Type 2 diabetes mellitus with other circulatory complication, without long-term current use of insulin (HCC) She relates she is taking her medicines we will check lab work await the results - Hemoglobin A1c - Basic Metabolic Panel  2. Hypertension, unspecified type Blood pressure on recheck is good continue current measures  3. Hyperlipidemia associated with type 2 diabetes mellitus (HCC) Check lab work continue current measures - Lipid panel  4. Aortic atherosclerosis (HCC) Goal is to keep LDL below 70 if possible  5. Bug bite, subsequent encounter Wound culture Bactroban ointment no need to be on antibiotics currently - WOUND CULTURE

## 2023-03-22 ENCOUNTER — Encounter: Payer: Self-pay | Admitting: Family Medicine

## 2023-03-22 LAB — HEMOGLOBIN A1C
Est. average glucose Bld gHb Est-mCnc: 148 mg/dL
Hgb A1c MFr Bld: 6.8 % — ABNORMAL HIGH (ref 4.8–5.6)

## 2023-03-22 LAB — LIPID PANEL
Chol/HDL Ratio: 3.3 ratio (ref 0.0–4.4)
Cholesterol, Total: 143 mg/dL (ref 100–199)
HDL: 43 mg/dL (ref >39)
LDL Chol Calc (NIH): 84 mg/dL (ref 0–99)
Triglycerides: 85 mg/dL (ref 0–149)
VLDL Cholesterol Cal: 16 mg/dL (ref 5–40)

## 2023-03-22 LAB — BASIC METABOLIC PANEL
BUN/Creatinine Ratio: 22 (ref 12–28)
BUN: 21 mg/dL (ref 8–27)
CO2: 25 mmol/L (ref 20–29)
Calcium: 9.3 mg/dL (ref 8.7–10.3)
Chloride: 103 mmol/L (ref 96–106)
Creatinine, Ser: 0.96 mg/dL (ref 0.57–1.00)
Glucose: 121 mg/dL — ABNORMAL HIGH (ref 70–99)
Potassium: 4 mmol/L (ref 3.5–5.2)
Sodium: 142 mmol/L (ref 134–144)
eGFR: 61 mL/min/{1.73_m2} (ref >59)

## 2023-03-22 LAB — SPECIMEN STATUS REPORT

## 2023-03-22 LAB — WOUND CULTURE

## 2023-03-22 NOTE — Progress Notes (Addendum)
Please mail to patient

## 2023-03-23 ENCOUNTER — Encounter: Payer: Medicare HMO | Attending: Family Medicine | Admitting: Nutrition

## 2023-03-23 ENCOUNTER — Encounter: Payer: Self-pay | Admitting: Nutrition

## 2023-03-23 VITALS — Ht 64.0 in | Wt 161.0 lb

## 2023-03-23 DIAGNOSIS — E782 Mixed hyperlipidemia: Secondary | ICD-10-CM | POA: Diagnosis not present

## 2023-03-23 DIAGNOSIS — I10 Essential (primary) hypertension: Secondary | ICD-10-CM | POA: Diagnosis not present

## 2023-03-23 DIAGNOSIS — E118 Type 2 diabetes mellitus with unspecified complications: Secondary | ICD-10-CM | POA: Insufficient documentation

## 2023-03-23 LAB — WOUND CULTURE: Organism ID, Bacteria: NONE SEEN

## 2023-03-23 NOTE — Patient Instructions (Addendum)
Goals  Cut out sodas Keep drinking water  Get A1C 6.5%

## 2023-03-23 NOTE — Progress Notes (Signed)
Medical Nutrition Therapy:  Appt start time: 1115  end time:  1145 Assessment:  Primary concerns today: Diabetes Type 2. PCP: Dr. Gerda Diss      Last A1C 6.8%, down from 7.6%. She is eating better-more whole plant based foods. Cut out processed foods of bacon, sausage and fast foods. Cooks at home.  Started taking Magnesium daily 400 mg per day BID for leg cramps. Still has some leg cramps from time to time. Referred to discuss with Dr. Gerda Diss. Had recent insect bite on leg. It is doing better. Will f/u with Dr. Gerda Diss in a few weeks.  Metformin 1000 mg BID Does yard work mowing yard and walks some for exercise.  Lab work looks better. LDL is 84. Mg/dl. EGFR is better at 61 mg/dl.   Wt Readings from Last 3 Encounters:  03/21/23 160 lb 12.8 oz (72.9 kg)  01/03/23 158 lb (71.7 kg)  10/19/22 161 lb (73 kg)   Ht Readings from Last 3 Encounters:  03/21/23 5\' 4"  (1.626 m)  01/03/23 5\' 4"  (1.626 m)  10/19/22 5\' 4"  (1.626 m)   There is no height or weight on file to calculate BMI. @BMIFA @ Facility age limit for growth %iles is 20 years. Facility age limit for growth %iles is 20 years.   Lab Results  Component Value Date   HGBA1C 6.8 (H) 03/21/2023      Latest Ref Rng & Units 03/21/2023    1:44 PM 10/19/2022   10:06 AM 06/28/2022    9:27 AM  CMP  Glucose 70 - 99 mg/dL 161  096  045   BUN 8 - 27 mg/dL 21  14  15    Creatinine 0.57 - 1.00 mg/dL 4.09  8.11  9.14   Sodium 134 - 144 mmol/L 142  140  143   Potassium 3.5 - 5.2 mmol/L 4.0  3.8  3.7   Chloride 96 - 106 mmol/L 103  101  104   CO2 20 - 29 mmol/L 25  23  26    Calcium 8.7 - 10.3 mg/dL 9.3  9.6  9.3   Total Protein 6.0 - 8.5 g/dL   6.4   Total Bilirubin 0.0 - 1.2 mg/dL   0.5   Alkaline Phos 44 - 121 IU/L   64   AST 0 - 40 IU/L   15   ALT 0 - 32 IU/L   16      Preferred Learning Style:  No preference indicated   Learning Readiness:   Ready Change in progress   MEDICATIONS: see list   DIETARY INTAKE:   24-hr  recall:  B) Oatmeal, 1/2 banana and blueberries or apples. L) beef, noodles,  D) Turnip greens, cornbread, water  Usual physical activity: walks,  Estimated energy needs: 1500  calories 170 g carbohydrates 112 g protein 42g fat  Progress Towards Goal(s):  In progress.Goals Nutritional Diagnosis:  NB-1.1 Food and nutrition-related knowledge deficit As related to Diabetes Type 2.  As evidenced by A1C .    Intervention:  Nutrition and Diabetes education provided on My Plate, CHO counting, meal planning, portion sizes, timing of meals, avoiding snacks between meals unless having a low blood sugar, target ranges for A1C and blood sugars, signs/symptoms and treatment of hyper/hypoglycemia, monitoring blood sugars, taking medications as prescribed, benefits of exercising 30 minutes per day and prevention of complications of DM. Marland Kitchen Goals  Cut out sodas Keep drinking water  Get A1C 6.5%  Teaching Method Utilized:   Auditory  Handouts given during  visit include: Verbally went over Plate Method, CHO counting and meal planning   Barriers to learning/adherence to lifestyle change: none  Demonstrated degree of understanding via:  Teach Back   Monitoring/Evaluation:  Dietary intake, exercise, , and body weight in 6 months.Marland Kitchen

## 2023-03-24 ENCOUNTER — Other Ambulatory Visit: Payer: Self-pay | Admitting: Family Medicine

## 2023-04-04 ENCOUNTER — Other Ambulatory Visit: Payer: Self-pay | Admitting: Family Medicine

## 2023-04-11 ENCOUNTER — Encounter: Payer: Self-pay | Admitting: Family Medicine

## 2023-04-11 ENCOUNTER — Other Ambulatory Visit: Payer: Self-pay

## 2023-04-11 ENCOUNTER — Ambulatory Visit (INDEPENDENT_AMBULATORY_CARE_PROVIDER_SITE_OTHER): Payer: Medicare HMO | Admitting: Family Medicine

## 2023-04-11 VITALS — BP 137/75 | HR 70 | Temp 98.7°F | Ht 64.0 in | Wt 162.0 lb

## 2023-04-11 DIAGNOSIS — K219 Gastro-esophageal reflux disease without esophagitis: Secondary | ICD-10-CM

## 2023-04-11 DIAGNOSIS — S90561D Insect bite (nonvenomous), right ankle, subsequent encounter: Secondary | ICD-10-CM

## 2023-04-11 DIAGNOSIS — E1159 Type 2 diabetes mellitus with other circulatory complications: Secondary | ICD-10-CM

## 2023-04-11 DIAGNOSIS — W57XXXD Bitten or stung by nonvenomous insect and other nonvenomous arthropods, subsequent encounter: Secondary | ICD-10-CM

## 2023-04-11 DIAGNOSIS — E1169 Type 2 diabetes mellitus with other specified complication: Secondary | ICD-10-CM

## 2023-04-11 DIAGNOSIS — I10 Essential (primary) hypertension: Secondary | ICD-10-CM

## 2023-04-11 DIAGNOSIS — Z79899 Other long term (current) drug therapy: Secondary | ICD-10-CM

## 2023-04-11 MED ORDER — PANTOPRAZOLE SODIUM 40 MG PO TBEC: DELAYED_RELEASE_TABLET | ORAL | 1 refills | Status: DC

## 2023-04-11 NOTE — Progress Notes (Signed)
   Subjective:    Patient ID: Kara Kirby, female    DOB: January 06, 1946, 77 y.o.   MRN: [id number]  HPI  Follow up for bug bite behind right ankle  Has questions about acid reflux medication - has two types of medication at home famotidine/ and pantoprazole We did discuss how PPIs have good benefits but also the negative side effects and discussed how with her reflux only happening once every few weeks to reduce the dose to every other day Review of Systems     Objective:   Physical Exam The bug bite on the lower leg is well-healed Pulse normal BP good       Assessment & Plan:   1. Bug bite, subsequent encounter Well-healed no need to do any antibiotics may finish using Bactroban ointment for an additional week  2. Gastroesophageal reflux disease without esophagitis Recommend pantoprazole do not recommend famotidine We will [facility] a [facility]ole [facility] with [facility] instructions to stop famotidine Patient will try the pantoprazole every other day to see if her symptoms stay under decent control yet at the same time minimize the frequency of PPIs  Patient will do comprehensive lab work including urine micro protein before her next visit

## 2023-06-29 ENCOUNTER — Other Ambulatory Visit: Payer: Self-pay | Admitting: Family Medicine

## 2023-06-29 DIAGNOSIS — I10 Essential (primary) hypertension: Secondary | ICD-10-CM

## 2023-06-29 DIAGNOSIS — I70219 Atherosclerosis of native arteries of extremities with intermittent claudication, unspecified extremity: Secondary | ICD-10-CM

## 2023-08-01 DIAGNOSIS — H401123 Primary open-angle glaucoma, left eye, severe stage: Secondary | ICD-10-CM | POA: Diagnosis not present

## 2023-08-01 DIAGNOSIS — H401111 Primary open-angle glaucoma, right eye, mild stage: Secondary | ICD-10-CM | POA: Diagnosis not present

## 2023-08-08 DIAGNOSIS — E785 Hyperlipidemia, unspecified: Secondary | ICD-10-CM | POA: Diagnosis not present

## 2023-08-08 DIAGNOSIS — E1159 Type 2 diabetes mellitus with other circulatory complications: Secondary | ICD-10-CM | POA: Diagnosis not present

## 2023-08-08 DIAGNOSIS — E1169 Type 2 diabetes mellitus with other specified complication: Secondary | ICD-10-CM | POA: Diagnosis not present

## 2023-08-08 DIAGNOSIS — Z79899 Other long term (current) drug therapy: Secondary | ICD-10-CM | POA: Diagnosis not present

## 2023-08-08 DIAGNOSIS — I10 Essential (primary) hypertension: Secondary | ICD-10-CM | POA: Diagnosis not present

## 2023-08-09 LAB — HEMOGLOBIN A1C
Est. average glucose Bld gHb Est-mCnc: 157 mg/dL
Hgb A1c MFr Bld: 7.1 % — ABNORMAL HIGH (ref 4.8–5.6)

## 2023-08-09 LAB — BASIC METABOLIC PANEL
BUN/Creatinine Ratio: 20 (ref 12–28)
BUN: 19 mg/dL (ref 8–27)
CO2: 26 mmol/L (ref 20–29)
Calcium: 9.5 mg/dL (ref 8.7–10.3)
Chloride: 102 mmol/L (ref 96–106)
Creatinine, Ser: 0.95 mg/dL (ref 0.57–1.00)
Glucose: 138 mg/dL — ABNORMAL HIGH (ref 70–99)
Potassium: 4.2 mmol/L (ref 3.5–5.2)
Sodium: 144 mmol/L (ref 134–144)
eGFR: 61 mL/min/{1.73_m2} (ref >59)

## 2023-08-09 LAB — LIPID PANEL
Chol/HDL Ratio: 3.3 {ratio} (ref 0.0–4.4)
Cholesterol, Total: 151 mg/dL (ref 100–199)
HDL: 46 mg/dL (ref >39)
LDL Chol Calc (NIH): 79 mg/dL (ref 0–99)
Triglycerides: 149 mg/dL (ref 0–149)
VLDL Cholesterol Cal: 26 mg/dL (ref 5–40)

## 2023-08-09 LAB — HEPATIC FUNCTION PANEL
ALT: 16 [IU]/L (ref 0–32)
AST: 16 [IU]/L (ref 0–40)
Albumin: 4.4 g/dL (ref 3.8–4.8)
Alkaline Phosphatase: 66 [IU]/L (ref 44–121)
Bilirubin Total: 0.3 mg/dL (ref 0.0–1.2)
Bilirubin, Direct: <0.1 mg/dL (ref 0.00–0.40)
Total Protein: 6.5 g/dL (ref 6.0–8.5)

## 2023-08-09 LAB — MICROALBUMIN / CREATININE URINE RATIO
Creatinine, Urine: 45.6 mg/dL
Microalb/Creat Ratio: 1665 mg/g{creat} — ABNORMAL HIGH (ref 0–29)
Microalbumin, Urine: 759.4 ug/mL

## 2023-08-12 ENCOUNTER — Ambulatory Visit (INDEPENDENT_AMBULATORY_CARE_PROVIDER_SITE_OTHER): Payer: Medicare HMO | Admitting: Family Medicine

## 2023-08-12 ENCOUNTER — Encounter: Payer: Self-pay | Admitting: Family Medicine

## 2023-08-12 VITALS — BP 130/72 | HR 74 | Temp 98.0°F | Ht 64.0 in | Wt 164.4 lb

## 2023-08-12 DIAGNOSIS — I700 Atherosclerosis of aorta: Secondary | ICD-10-CM | POA: Diagnosis not present

## 2023-08-12 DIAGNOSIS — E785 Hyperlipidemia, unspecified: Secondary | ICD-10-CM | POA: Diagnosis not present

## 2023-08-12 DIAGNOSIS — I10 Essential (primary) hypertension: Secondary | ICD-10-CM | POA: Diagnosis not present

## 2023-08-12 DIAGNOSIS — E1169 Type 2 diabetes mellitus with other specified complication: Secondary | ICD-10-CM

## 2023-08-12 DIAGNOSIS — E1159 Type 2 diabetes mellitus with other circulatory complications: Secondary | ICD-10-CM

## 2023-08-12 MED ORDER — FLUCONAZOLE 150 MG PO TABS
150.0000 mg | ORAL_TABLET | Freq: Every day | ORAL | 2 refills | Status: DC
Start: 2023-08-12 — End: 2024-02-14

## 2023-08-12 NOTE — Progress Notes (Signed)
Subjective:    Patient ID: Kara Kirby, female    DOB: May 16, 1946, 77 y.o.   MRN: [id number]  Discussed the use of AI scribe software for clinical note transcription with the patient, who gave verbal consent to proceed.  History of Present Illness   The patient, with a history of diabetes, hypertension, and bronchitis, presents with multiple complaints. She reports sinus discomfort and pain in the chest area, which she attributes to her bronchitis. She also mentions a hoarse voice and occasional cough, with clear sputum production. The patient notes some difficulty with mobility due to pain in the right hip region, which started after mowing the lawn and has since migrated to the left side.  The patient also reports a soreness in the jaw area, which she experiences while at rest. She denies any associated chest pressure or tightness, except for the previously mentioned bronchial discomfort.    She also reports an itching sensation, suggestive of a yeast infection.  Regarding her diabetes management, the patient admits to occasional indulgence in fried foods and starchy foods like potatoes, despite efforts to maintain a healthy diet. She has noticed an increase in urinary frequency, but denies any other changes in urinary habits.  The patient also mentions recent emotional distress due to the death of two neighbors, which has seemingly impacted her overall wellbeing. She remains active, engaging in yard work and considering joining exercise classes. Despite the various health issues, the patient appears to be proactive in managing her health and maintaining an active lifestyle.         Review of Systems     Objective:    Physical Exam   VITALS: BP- 132/76 NECK: No goiter palpated. CHEST: Lungs clear to auscultation. CARDIOVASCULAR: Heart sounds normal on auscultation.     Bno       Assessment & Plan:  Assessment and Plan    Chronic Sinusitis Complaints of sinus  discomfort and dryness. -Continue current treatment regimen.  Bronchitis Reports of soreness in the bronchial region, occasional cough with clear sputum. -Continue current treatment regimen.  Hip Pain Pain started a month ago after yard work, initially on the right side, now on the left. No associated trauma. -Continue monitoring. If pain persists, consider X-rays to assess for arthritis.  Dyslipidemia LDL at 79, goal is under 70. Currently on Atorvastatin 80mg  daily. -Continue Atorvastatin 80mg  daily. Encourage low cholesterol diet.  Diabetes Mellitus Recent A1C at 7.1, up from 6.8 six months ago. -Continue current treatment regimen. Encourage low sugar and low starch diet.  Proteinuria Increased protein in urine, indicating possible kidney damage due to diabetes. -Consult with nephrologist for further evaluation and management. Consider adding Jardiance to treatment regimen pending nephrologist's recommendation.  Yeast Infection Reports of itching. -Prescribe Fluconazole (Diflucan), send prescription to [facility].  Eye Health Recent visit to Dr. Lottie Dawson for vision check, plans for glasses adjustment. No current cataract development. -Continue current treatment regimen.  Jaw Pain Reports of intermittent jaw pain. -Continue monitoring.  General Health Maintenance -Encourage continued physical activity. -Check feet for any signs of diabetic neuropathy. -Follow up in three months.     1. Type 2 diabetes mellitus with other circulatory complication, without long-term current use of insulin (HCC) A1c decent control continue current measures  2. Hypertension, unspecified type Blood pressure under good control  3. Hyperlipidemia associated with type 2 diabetes mellitus (HCC) Healthy diet regular activity continue medication  4. Aortic atherosclerosis (HCC) Keep up below 70  Jaw pain possibly salivary  gland issue  Follow-up within 3 to 4 months

## 2023-08-14 ENCOUNTER — Telehealth: Payer: Self-pay | Admitting: Family Medicine

## 2023-08-14 NOTE — Telephone Encounter (Signed)
Follow-up on increased protein in urine

## 2023-08-20 NOTE — Telephone Encounter (Signed)
Please let me speak with Dr. Wolfgang Phoenix nephrologist regarding this patient-he can call my cell number [phone] this is regarding clinical question

## 2023-08-22 NOTE — Telephone Encounter (Signed)
Left a voicemail for a call back

## 2023-08-23 ENCOUNTER — Other Ambulatory Visit: Payer: Self-pay | Admitting: Family Medicine

## 2023-08-25 NOTE — Telephone Encounter (Signed)
So I spoke with Dr. Wolfgang Phoenix the kidney specialist He states that he would like to see her for this problem because of the protein He agrees that she should start on a low-dose medication that works through the kidneys that would improve kidney function but can make a person increased urination.  We can either send this medicine in or if she would like to do an office visit to discuss it further she can be given a open slot somewhere within the next 3 weeks  The medication is called Jardiance or Marcelline Deist whichever 1 is covered by her insurance Please talk with patient and see what she would like to do

## 2023-08-25 NOTE — Telephone Encounter (Signed)
Called and left message for patient to call office. (Nurse Note* So I spoke with Dr. Wolfgang Phoenix the kidney specialist He states that he would like to see her for this problem because of the protein He agrees that she should start on a low-dose medication that works through the kidneys that would improve kidney function but can make a person increased urination.  We can either send this medicine in or if she would like to do an office visit to discuss it further she can be given a open slot somewhere within the next 3 weeks   The medication is called Jardiance or Marcelline Deist whichever 1 is covered by her insurance Please talk with patient and see what she would like to do)

## 2023-08-29 ENCOUNTER — Telehealth: Payer: Self-pay | Admitting: *Deleted

## 2023-08-29 NOTE — Telephone Encounter (Signed)
The patient is scheduled to come in to talk with me regarding her medication in 2 days May go ahead with referral to Dr. Wolfgang Phoenix office, chronic kidney disease Please be aware it would be wise for their office not to call her until after her visit with me-she is a patient who needs a fair amount of input regarding this before she will go for any type of referral

## 2023-08-29 NOTE — Telephone Encounter (Signed)
Source  Kara Kirby (Patient)   Subject  Kara Kirby (Patient)   Topic  Referral - Status    Communication  Reason for CRM: Allyson Sabal, with [facility], calling to check on the status of a referral. She states they have been waiting on referral for over a week now. Please give her a call back to inform of status of referral. CB # [phone].

## 2023-08-30 ENCOUNTER — Other Ambulatory Visit: Payer: Self-pay

## 2023-08-30 DIAGNOSIS — N189 Chronic kidney disease, unspecified: Secondary | ICD-10-CM

## 2023-08-30 NOTE — Telephone Encounter (Signed)
See other message- patient has office visit 08/31/23 with Dr Lorin Picket. Referral ordered in [facility]

## 2023-08-31 ENCOUNTER — Ambulatory Visit (INDEPENDENT_AMBULATORY_CARE_PROVIDER_SITE_OTHER): Payer: Medicare HMO | Admitting: Family Medicine

## 2023-08-31 ENCOUNTER — Encounter: Payer: Self-pay | Admitting: Family Medicine

## 2023-08-31 VITALS — BP 120/64 | HR 66 | Temp 97.9°F | Ht 64.0 in | Wt 165.6 lb

## 2023-08-31 DIAGNOSIS — I10 Essential (primary) hypertension: Secondary | ICD-10-CM

## 2023-08-31 DIAGNOSIS — R809 Proteinuria, unspecified: Secondary | ICD-10-CM | POA: Diagnosis not present

## 2023-08-31 DIAGNOSIS — Z7984 Long term (current) use of oral hypoglycemic drugs: Secondary | ICD-10-CM

## 2023-08-31 DIAGNOSIS — E1159 Type 2 diabetes mellitus with other circulatory complications: Secondary | ICD-10-CM

## 2023-08-31 DIAGNOSIS — N189 Chronic kidney disease, unspecified: Secondary | ICD-10-CM

## 2023-08-31 MED ORDER — EMPAGLIFLOZIN 10 MG PO TABS
10.0000 mg | ORAL_TABLET | Freq: Every day | ORAL | 3 refills | Status: DC
Start: 2023-08-31 — End: 2023-11-21

## 2023-09-01 NOTE — Progress Notes (Signed)
   Subjective:    Patient ID: Kara Kirby, female    DOB: 10/16/1945, 77 y.o.   MRN: [id number]  HPI Very nice patient Today comes in to discuss Jardiance Also to discuss proteinuria We did discuss underlying causes including age, diabetes, blood pressure We also discussed how there could be other etiologies going on and this needs further looking into because of the significant step up There has been some level of proteinuria for the past 6 7 years but it has always been in a relatively reasonable range until most recently where it spiked over 1600 She denies hematuria and denies flank pain She is kept her A1c under reasonable control over the past several years typically ranging anywhere from upper sixes to low sevens She does try to maintain regularity with her medications We did discuss how it is important to see nephrology and discussed what a nephrologist does We also discussed the mechanisms of the medication Also discussed mechanisms of proteinuria  She also relates having some jaw discomfort around her teeth it is more sensitive she is now using Sensodyne she plans on following up with a dentist Most recent A1c 7.1 on August 08, 2023 Metabolic 7 at that time showed creatinine 0.95 with a GFR of 61 Urine micro protein milligram per gram showed 1665 10 months previous it was 203, 2 years previous 164 3 years previous 109 4 years previous 56 Review of Systems     Objective:   Physical Exam  General-in no acute distress Eyes-no discharge Lungs-respiratory rate normal, CTA CV-no murmurs,RRR Extremities skin warm dry no edema Neuro grossly normal Behavior normal, alert       Assessment & Plan:   Patient is concerned on whether or not sure if be able to tolerate the medication Side effects were discussed She is also concerned whether or not she can afford the price we will send this through her mail order because it gives her at a better price and if the cost is  still beyond what she can afford she will let us know and we will have clinical pharmacist work with her to see if we can get it at a lower price  The importance of being on this medicine was discussed in detail  We will also do renal ultrasound and some additional lab work in addition to this will move forward with the referral to nephrology Dr. Wolfgang Phoenix and the patient will follow-up with myself in mid December to check her blood pressure and monitor accordingly

## 2023-09-05 ENCOUNTER — Ambulatory Visit (HOSPITAL_COMMUNITY)
Admission: RE | Admit: 2023-09-05 | Discharge: 2023-09-05 | Disposition: A | Discharge status: Home / Self Care | Payer: Medicare HMO | Source: Ambulatory Visit | Attending: Family Medicine | Admitting: Family Medicine

## 2023-09-05 ENCOUNTER — Other Ambulatory Visit (HOSPITAL_COMMUNITY): Payer: Self-pay | Admitting: Family Medicine

## 2023-09-05 DIAGNOSIS — R809 Proteinuria, unspecified: Secondary | ICD-10-CM | POA: Diagnosis not present

## 2023-09-05 DIAGNOSIS — N189 Chronic kidney disease, unspecified: Secondary | ICD-10-CM | POA: Diagnosis not present

## 2023-09-05 DIAGNOSIS — Z1231 Encounter for screening mammogram for malignant neoplasm of breast: Secondary | ICD-10-CM

## 2023-09-05 DIAGNOSIS — N281 Cyst of kidney, acquired: Secondary | ICD-10-CM | POA: Diagnosis not present

## 2023-09-16 ENCOUNTER — Other Ambulatory Visit: Payer: Self-pay | Admitting: Family Medicine

## 2023-09-16 DIAGNOSIS — R809 Proteinuria, unspecified: Secondary | ICD-10-CM | POA: Diagnosis not present

## 2023-09-16 DIAGNOSIS — I10 Essential (primary) hypertension: Secondary | ICD-10-CM | POA: Diagnosis not present

## 2023-09-18 LAB — BASIC METABOLIC PANEL
BUN/Creatinine Ratio: 18 (ref 12–28)
BUN: 19 mg/dL (ref 8–27)
CO2: 22 mmol/L (ref 20–29)
Calcium: 9.2 mg/dL (ref 8.7–10.3)
Chloride: 107 mmol/L — ABNORMAL HIGH (ref 96–106)
Creatinine, Ser: 1.03 mg/dL — ABNORMAL HIGH (ref 0.57–1.00)
Glucose: 125 mg/dL — ABNORMAL HIGH (ref 70–99)
Potassium: 3.8 mmol/L (ref 3.5–5.2)
Sodium: 145 mmol/L — ABNORMAL HIGH (ref 134–144)
eGFR: 56 mL/min/{1.73_m2} — ABNORMAL LOW (ref >59)

## 2023-09-18 LAB — ANA

## 2023-09-18 LAB — C-REACTIVE PROTEIN: CRP: 2 mg/L (ref 0–10)

## 2023-09-18 LAB — SEDIMENTATION RATE: Sed Rate: 2 mm/h (ref 0–40)

## 2023-09-21 ENCOUNTER — Telehealth: Payer: Self-pay | Admitting: Family Medicine

## 2023-09-21 ENCOUNTER — Ambulatory Visit: Payer: Medicare HMO | Admitting: Family Medicine

## 2023-09-21 ENCOUNTER — Encounter: Payer: Self-pay | Admitting: Family Medicine

## 2023-09-21 VITALS — BP 142/64 | HR 74 | Temp 97.2°F | Ht 64.0 in | Wt 167.0 lb

## 2023-09-21 DIAGNOSIS — Z7984 Long term (current) use of oral hypoglycemic drugs: Secondary | ICD-10-CM

## 2023-09-21 DIAGNOSIS — R809 Proteinuria, unspecified: Secondary | ICD-10-CM

## 2023-09-21 DIAGNOSIS — Q6102 Congenital multiple renal cysts: Secondary | ICD-10-CM

## 2023-09-21 DIAGNOSIS — E1159 Type 2 diabetes mellitus with other circulatory complications: Secondary | ICD-10-CM

## 2023-09-21 DIAGNOSIS — N189 Chronic kidney disease, unspecified: Secondary | ICD-10-CM | POA: Diagnosis not present

## 2023-09-21 NOTE — Progress Notes (Signed)
   Subjective:    Patient ID: Kara Kirby, female    DOB: 1946-09-05, 77 y.o.   MRN: [id number]   History of Present Illness   The patient, with a history of diabetes and kidney disease, was referred to a nephrologist due to proteinuria and kidney cysts. She has been taking Jardiance, a medication prescribed to improve kidney filtration and reduce proteinuria. The patient reported noticing foam in her urine, a sign of proteinuria, and has been experiencing swelling in her ankles, which is also associated with proteinuria.  The patient also reported a transient episode of right arm weakness that occurred three weeks ago while grocery shopping. The weakness lasted for approximately ten minutes and has not recurred.  The patient's diabetes has been relatively well-controlled, with a recent fasting blood glucose of 112. However, her most recent HbA1c was 7.1, up from 6.8 six months prior.  The patient's mood appears to be low, with a lack of interest in usual activities such as holiday preparations. This seems to be related to her son not coming home for the holidays.  The patient's blood pressure was slightly elevated at the beginning of the visit but improved upon recheck. She has been adhering to her medication regimen, which includes Jardiance.         Review of Systems     Objective:    Physical Exam   VITALS: BP- 142/64 EXTREMITIES: Edema in legs. Upper extremities strength normal.     General-in no acute distress Eyes-no discharge Lungs-respiratory rate normal, CTA CV-no murmurs,RRR Extremities skin warm dry  Neuro grossly normal Behavior normal, alert       Assessment & Plan:  Assessment and Plan    Chronic Kidney Disease Proteinuria with lower extremity edema. Small renal cysts noted on ultrasound. Patient has been referred to nephrology for further evaluation and management. -Continue Jardiance for kidney protection. -See nephrologist on September 27, 2023.  Hypertension Blood pressure slightly elevated at the visit. -Continue current antihypertensive regimen. -Recheck blood pressure at next visit.  Transient Right Arm Weakness Episode of transient right arm weakness occurred three weeks ago, lasting approximately 10 minutes. No recurrence since then. No signs of stroke on examination. -Monitor for any recurrence of symptoms and seek immediate medical attention if symptoms persist.  Diabetes Mellitus Recent blood glucose levels have been satisfactory. A1c was 7.1 at the end of October, hoping for improvement at next check. -Continue current antidiabetic regimen. -Check A1c at next visit.  General Health Maintenance -Complete paperwork for nephrology appointment. -Continue taking one multivitamin daily. -Follow-up visit in approximately six weeks after nephrology consultation.      1. Proteinuria, unspecified type Recent ultrasound and lab work reviewed with patient Patient will see Dr. Wolfgang Phoenix in the near future for kidney evaluation We will follow her up here in 8 to 12 weeks Follow-up sooner if any problems Toller rating Jardiance Patient in the past had angioedema with ACE inhibitor Blood pressure fair at 142/64  2. Type 2 diabetes mellitus with other circulatory complication, without long-term current use of insulin (HCC) A1c in a reasonable range with metformin and recently started Jardiance seems to be tolerating Jardiance   3. Chronic kidney disease, unspecified CKD stage Will be seeing kidney specialist soon  4. Multiple renal cysts Ultrasound showed multiple kidney cysts

## 2023-09-21 NOTE — Telephone Encounter (Signed)
Please make sure that the patient recent office visit notes for this visit and last visit as well as lab work and ultrasound are forwarded to Dr. Wolfgang Phoenix kidney specialist she sees him in approximately 5 days thank you

## 2023-09-26 ENCOUNTER — Encounter: Payer: Medicare HMO | Attending: Family Medicine | Admitting: Nutrition

## 2023-09-26 ENCOUNTER — Other Ambulatory Visit: Payer: Self-pay | Admitting: Family Medicine

## 2023-09-26 VITALS — Ht 64.0 in | Wt 165.0 lb

## 2023-09-26 DIAGNOSIS — E782 Mixed hyperlipidemia: Secondary | ICD-10-CM | POA: Insufficient documentation

## 2023-09-26 DIAGNOSIS — E118 Type 2 diabetes mellitus with unspecified complications: Secondary | ICD-10-CM | POA: Diagnosis not present

## 2023-09-26 DIAGNOSIS — I10 Essential (primary) hypertension: Secondary | ICD-10-CM | POA: Diagnosis not present

## 2023-09-26 NOTE — Patient Instructions (Signed)
Use more herbs and spices to seasonings. Avoid processed foods of fried foods, bologna Use vinegar  in foods Get A1C down to 6.5%

## 2023-09-26 NOTE — Progress Notes (Unsigned)
Medical Nutrition Therapy:  Appt start time: 1115  end time:  1145 Assessment:  Primary concerns today: Diabetes Type 2. PCP: Dr. Gerda Diss      Last A1C 6.8%, down from 7.6%. She is eating better-more whole plant based foods. Cut out processed foods of bacon, sausage and fast foods. Cooks at home.  Started taking Magnesium daily 400 mg per day BID for leg cramps. Still has some leg cramps from time to time. Referred to discuss with Dr. Gerda Diss. Had recent insect bite on leg. It is doing better. Will f/u with Dr. Gerda Diss in a few weeks.  Metformin 1000 mg BID Does yard work mowing yard and walks some for exercise.  Lab work looks better. LDL is 84. Mg/dl. EGFR is better at 61 mg/dl.   Wt Readings from Last 3 Encounters:  09/21/23 167 lb (75.8 kg)  08/31/23 165 lb 9.6 oz (75.1 kg)  08/12/23 164 lb 6.4 oz (74.6 kg)   Ht Readings from Last 3 Encounters:  09/21/23 5\' 4"  (1.626 m)  08/31/23 5\' 4"  (1.626 m)  08/12/23 5\' 4"  (1.626 m)   There is no height or weight on file to calculate BMI. @BMIFA @ Facility age limit for growth %iles is 20 years. Facility age limit for growth %iles is 20 years.   Lab Results  Component Value Date   HGBA1C 7.1 (H) 08/08/2023      Latest Ref Rng & Units 09/16/2023    8:35 AM 08/08/2023    8:42 AM 03/21/2023    1:44 PM  CMP  Glucose 70 - 99 mg/dL 478  295  621   BUN 8 - 27 mg/dL 19  19  21    Creatinine 0.57 - 1.00 mg/dL 3.08  6.57  8.46   Sodium 134 - 144 mmol/L 145  144  142   Potassium 3.5 - 5.2 mmol/L 3.8  4.2  4.0   Chloride 96 - 106 mmol/L 107  102  103   CO2 20 - 29 mmol/L 22  26  25    Calcium 8.7 - 10.3 mg/dL 9.2  9.5  9.3   Total Protein 6.0 - 8.5 g/dL  6.5    Total Bilirubin 0.0 - 1.2 mg/dL  0.3    Alkaline Phos 44 - 121 IU/L  66    AST 0 - 40 IU/L  16    ALT 0 - 32 IU/L  16       Preferred Learning Style:  No preference indicated   Learning Readiness:   Ready Change in progress   MEDICATIONS: see list   DIETARY  INTAKE:   24-hr recall:  B) Oatmeal, 1/2 banana and blueberries or apples.   L) Bologna sandwich with tomatoes, lettuce, water  Nacho chips.   D) Chicken, brussel sprouts, and baked potato, water   Usual physical activity: walks,  Estimated energy needs: 1500  calories 170 g carbohydrates 112 g protein 42g fat  Progress Towards Goal(s):  In progress.Goals Nutritional Diagnosis:  NB-1.1 Food and nutrition-related knowledge deficit As related to Diabetes Type 2.  As evidenced by A1C .    Intervention:  Nutrition and Diabetes education provided on My Plate, CHO counting, meal planning, portion sizes, timing of meals, avoiding snacks between meals unless having a low blood sugar, target ranges for A1C and blood sugars, signs/symptoms and treatment of hyper/hypoglycemia, monitoring blood sugars, taking medications as prescribed, benefits of exercising 30 minutes per day and prevention of complications of DM. Marland Kitchen Goals  Cut out sodas Keep  drinking water  Get A1C 6.5%  Teaching Method Utilized:   Auditory  Handouts given during visit include: Verbally went over Plate Method, CHO counting and meal planning   Barriers to learning/adherence to lifestyle change: none  Demonstrated degree of understanding via:  Teach Back   Monitoring/Evaluation:  Dietary intake, exercise, , and body weight in 6 months.Marland Kitchen

## 2023-09-27 DIAGNOSIS — N2581 Secondary hyperparathyroidism of renal origin: Secondary | ICD-10-CM | POA: Diagnosis not present

## 2023-09-27 DIAGNOSIS — I129 Hypertensive chronic kidney disease with stage 1 through stage 4 chronic kidney disease, or unspecified chronic kidney disease: Secondary | ICD-10-CM | POA: Diagnosis not present

## 2023-09-27 DIAGNOSIS — R809 Proteinuria, unspecified: Secondary | ICD-10-CM | POA: Diagnosis not present

## 2023-09-27 DIAGNOSIS — I5032 Chronic diastolic (congestive) heart failure: Secondary | ICD-10-CM | POA: Diagnosis not present

## 2023-09-27 DIAGNOSIS — I110 Hypertensive heart disease with heart failure: Secondary | ICD-10-CM | POA: Diagnosis not present

## 2023-09-27 DIAGNOSIS — N1831 Chronic kidney disease, stage 3a: Secondary | ICD-10-CM | POA: Diagnosis not present

## 2023-09-27 DIAGNOSIS — E1122 Type 2 diabetes mellitus with diabetic chronic kidney disease: Secondary | ICD-10-CM | POA: Diagnosis not present

## 2023-10-15 ENCOUNTER — Other Ambulatory Visit: Payer: Self-pay | Admitting: Family Medicine

## 2023-10-19 DIAGNOSIS — N189 Chronic kidney disease, unspecified: Secondary | ICD-10-CM | POA: Diagnosis not present

## 2023-10-19 LAB — COMPREHENSIVE METABOLIC PANEL WITH GFR
Albumin: 4.1 (ref 3.5–5.0)
Calcium: 9.4 (ref 8.7–10.7)

## 2023-10-20 LAB — LAB REPORT - SCANNED: A1c: 6.8

## 2023-10-21 ENCOUNTER — Ambulatory Visit (HOSPITAL_COMMUNITY)
Admission: RE | Admit: 2023-10-21 | Discharge: 2023-10-21 | Disposition: A | Discharge status: Home / Self Care | Payer: Medicare HMO | Source: Ambulatory Visit | Attending: Family Medicine | Admitting: Family Medicine

## 2023-10-21 DIAGNOSIS — Z1231 Encounter for screening mammogram for malignant neoplasm of breast: Secondary | ICD-10-CM | POA: Insufficient documentation

## 2023-10-25 ENCOUNTER — Encounter: Payer: Self-pay | Admitting: Nutrition

## 2023-11-09 ENCOUNTER — Ambulatory Visit (INDEPENDENT_AMBULATORY_CARE_PROVIDER_SITE_OTHER): Payer: Medicare HMO | Admitting: Family Medicine

## 2023-11-09 VITALS — BP 148/78 | HR 63 | Temp 98.1°F | Ht 64.0 in | Wt 162.8 lb

## 2023-11-09 DIAGNOSIS — I10 Essential (primary) hypertension: Secondary | ICD-10-CM

## 2023-11-09 DIAGNOSIS — E1159 Type 2 diabetes mellitus with other circulatory complications: Secondary | ICD-10-CM

## 2023-11-09 DIAGNOSIS — R0981 Nasal congestion: Secondary | ICD-10-CM

## 2023-11-09 DIAGNOSIS — N189 Chronic kidney disease, unspecified: Secondary | ICD-10-CM | POA: Diagnosis not present

## 2023-11-09 NOTE — Progress Notes (Signed)
Subjective:    Patient ID: Kara Kirby, female    DOB: 07/29/1946, 78 y.o.   MRN: [id number]  Discussed the use of AI scribe software for clinical note transcription with the patient, who gave verbal consent to proceed. Extremely nice patient Here today with a few items She is dealing with chronic kidney disease follows with nephrology She also has underlying high blood pressure tries to eat healthy and takes her medicine She does have swelling in her ankles which concerns her she is not quite sure why that is happening and relates foamy urine We did discuss how proteinuria can cause this She does relate some sinus pressure and pain but nothing severe currently no discoloration no phlegm History of Present Illness   The patient presents for follow-up regarding blood pressure management and kidney function.  She is monitoring her blood pressure at home, noting fluctuations with dietary changes, such as increased readings after consuming potatoes. Her recent measurement was 148/78. She is currently taking olmesartan and hydrochlorothiazide for blood pressure control.  Concerns about kidney function have arisen due to potential proteinuria. She is taking Jardiance, which aids in both diabetes management and kidney function preservation. She has not yet received the results of a recent lab test sent to another physician.  She experiences swelling in the ankles and lower legs, which she attributes to fluid retention related to protein loss in the urine. She has noticed foam in her urine, which she understands is due to protein presence.  She frequently experiences facial pain, sinus issues, and gum discomfort. She uses a humidifier at home to manage sinus symptoms and has inquired about the use of cod liver oil for cold symptoms.  She is on atorvastatin for cholesterol management and has been advised on dietary modifications to manage cholesterol levels.         Review of Systems      Objective:    Physical Exam   VITALS: BP- 148/78 EXTREMITIES: Edema in ankles.     General-in no acute distress Eyes-no discharge Lungs-respiratory rate normal, CTA CV-no murmurs,RRR Extremities skin warm dry no edema Neuro grossly normal Behavior normal, alert       Assessment & Plan:  Assessment and Plan    Hypertension   Initiated on Olmesartan and Hydrochlorothiazide by another provider. Discussed the mechanism of action and potential side effects of Olmesartan.   -Continue Olmesartan and Hydrochlorothiazide as prescribed.   -Communicate with other provider to discuss potential dose adjustment of Olmesartan due to elevated blood pressure.    Kidney Disease   Proteinuria noted, likely contributing to lower extremity edema. Discussed the mechanism of edema formation in the context of proteinuria and kidney disease.   -Continue Jardiance for kidney protection.   -Plan for 24-hour urine protein collection.    Hyperlipidemia   LDL levels have been stable and within target range on Atorvastatin. Discussed potential addition of Zetia, but decided against it given stable LDL levels.   -Continue Atorvastatin.   -Maintain low cholesterol diet.    Follow-up   Plan to see patient in early May after her April appointment with nephrology provider.   -Request lab results from other provider's office.   -Consider blood work in the spring depending on results from other provider.      1. Type 2 diabetes mellitus with other circulatory complication, without long-term current use of insulin (HCC) Previous A1c respectable healthy diet continue medication check lab before next visit but will base lab work around  what her nephrologist does or does not check  2. Chronic kidney disease, unspecified CKD stage (Primary) We will try to get notes from her recent visit with nephrology Blood pressure elevated They recently started her on low-dose HCTZ as well as olmesartan 5 mg a day The  goal is to get her blood pressure under better control  3. Hypertension, unspecified type See discussion above trying to get the notes from nephrology may need to bump up the dose of the olmesartan  4. Sinus congestion Saline nasal spray as needed, use a humidifier Comprehensive follow-up in May with lab work before that visit

## 2023-11-16 ENCOUNTER — Ambulatory Visit: Payer: Medicare HMO | Admitting: Family Medicine

## 2023-11-19 ENCOUNTER — Other Ambulatory Visit: Payer: Self-pay | Admitting: Family Medicine

## 2023-11-21 ENCOUNTER — Other Ambulatory Visit: Payer: Self-pay

## 2023-11-21 DIAGNOSIS — E1159 Type 2 diabetes mellitus with other circulatory complications: Secondary | ICD-10-CM

## 2023-11-21 MED ORDER — EMPAGLIFLOZIN 10 MG PO TABS: 10.0000 mg | ORAL_TABLET | Freq: Every day | ORAL | 3 refills | Status: DC

## 2023-12-09 DIAGNOSIS — H524 Presbyopia: Secondary | ICD-10-CM | POA: Diagnosis not present

## 2023-12-20 ENCOUNTER — Telehealth: Payer: Self-pay | Admitting: *Deleted

## 2023-12-20 NOTE — Telephone Encounter (Unsigned)
 Copied from CRM [id number]. Topic: Clinical - Medication Question >> Dec 20, 2023 11:58 AM Macon Large wrote: Reason for CRM: Patient reports that she had a dentist appointment but it has to be rescheduled because she did not take 4 antibiotics before the scheduled dental work. Patient questions if she has to take the antibiotics each time she needs dental work. Patient requests call back to discuss because she is being told that the appointment can not be rescheduled until she gets confirmation from her doctor. Call back# [phone]

## 2023-12-22 ENCOUNTER — Telehealth: Payer: Self-pay

## 2023-12-22 ENCOUNTER — Encounter: Payer: Self-pay | Admitting: Family Medicine

## 2023-12-22 NOTE — Telephone Encounter (Signed)
 A letter was dictated.  Please print the letter.  Please give letter to patient.  She does not need to take antibiotics before dental procedure

## 2023-12-22 NOTE — Telephone Encounter (Signed)
 Letter printed and at front desk, called pt to inform her, no anwer

## 2023-12-22 NOTE — Telephone Encounter (Signed)
 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

## 2023-12-22 NOTE — Telephone Encounter (Signed)
 Patient aware- see other message from patient

## 2023-12-26 ENCOUNTER — Telehealth: Payer: Self-pay | Admitting: Family Medicine

## 2023-12-26 NOTE — Telephone Encounter (Signed)
 It is my understanding that individuals who have stent do not have to take antibiotics before dental procedures, I have sent a note to her vascular team-hopefully they will respond soon-patient has an appointment with vascular surgery in April 1 if her team does not let me know the answer to this question before April 1 she can asked them Please inform patient

## 2023-12-26 NOTE — Telephone Encounter (Signed)
 Tried calling and left a message in order for the pt to return the call to inform her message regarding dental proc and abx.

## 2023-12-26 NOTE — Telephone Encounter (Signed)
 Nurses I did touch base with vascular specialist They stated the patient does not need to take antibiotics before dental procedures I did dictate a letter to this degree on March 13 Patient may have a copy of this letter Thanks-Dr. Lorin Picket  See documentation below [place], McKenzi P, PA-C  Babs Sciara, MD Good morning,  Thank you for reaching out about this patient.  She does not need antibiotics before a dental procedure.  Thank you, McKenzi [place]

## 2023-12-27 NOTE — Telephone Encounter (Signed)
 Patient is made aware of the doctor's notes and recommendations

## 2023-12-27 NOTE — Telephone Encounter (Signed)
 Left a message for return call to inform per provider recommenations

## 2023-12-27 NOTE — Telephone Encounter (Signed)
 Pt made aware and was given letter at front desk

## 2023-12-30 ENCOUNTER — Other Ambulatory Visit: Payer: Self-pay

## 2023-12-30 DIAGNOSIS — I6523 Occlusion and stenosis of bilateral carotid arteries: Secondary | ICD-10-CM

## 2024-01-10 ENCOUNTER — Ambulatory Visit (INDEPENDENT_AMBULATORY_CARE_PROVIDER_SITE_OTHER): Payer: Medicare HMO | Admitting: Physician Assistant

## 2024-01-10 ENCOUNTER — Ambulatory Visit (HOSPITAL_COMMUNITY)
Admission: RE | Admit: 2024-01-10 | Discharge: 2024-01-10 | Disposition: A | Discharge status: Home / Self Care | Payer: Medicare HMO | Source: Ambulatory Visit | Attending: Vascular Surgery | Admitting: Vascular Surgery

## 2024-01-10 VITALS — BP 179/76 | HR 65 | Temp 97.7°F | Resp 18 | Ht 64.0 in | Wt 161.8 lb

## 2024-01-10 DIAGNOSIS — I6523 Occlusion and stenosis of bilateral carotid arteries: Secondary | ICD-10-CM | POA: Insufficient documentation

## 2024-01-10 NOTE — Progress Notes (Signed)
 Office Note   History of Present Illness   Kara Kirby is a 78 y.o. (05-11-1946) female who presents for surveillance of carotid artery stenosis.  She has a history of right carotid endarterectomy by Dr. Edilia Bo in 2004 for symptomatic stenosis.  She subsequently required transfemoral right ICA stenting due to recurrent asymptomatic stenosis by Dr. Darrick Penna in 2017.  She does have some residual left leg weakness due to her stroke in 2004.  She returns today for follow-up.  She says that she has been doing well since her last office visit.  She denies any recent strokelike symptoms such as slurred speech, facial droop, sudden visual changes, or sudden weakness/numbness.  She likes to workout on the elliptical several days a week. She days a daily aspirin, plavix, and statin.  Current Outpatient Medications  Medication Sig Dispense Refill   acetaminophen (TYLENOL) 500 MG tablet Take 500-1,000 mg by mouth 2 (two) times daily as needed for mild pain.     amLODipine (NORVASC) 10 MG tablet TAKE 1 TABLET EVERY DAY 90 tablet 3   aspirin 81 MG tablet Take 81 mg by mouth daily.     atorvastatin (LIPITOR) 80 MG tablet TAKE 1 TABLET EVERY DAY 90 tablet 3   blood glucose meter kit and supplies Dispense based on patient and insurance preference. Use to test glucose once daily (FOR ICD-10 E11.9). 1 each 0   clopidogrel (PLAVIX) 75 MG tablet TAKE 1 TABLET (75 MG TOTAL) BY MOUTH DAILY. 90 tablet 3   dorzolamide-timolol (COSOPT) 2-0.5 % ophthalmic solution Place 1 drop into both eyes 2 times daily.     doxazosin (CARDURA) 4 MG tablet TAKE 1 TABLET (4 MG TOTAL) BY MOUTH AT BEDTIME. 90 tablet 3   empagliflozin (JARDIANCE) 10 MG TABS tablet Take 1 tablet (10 mg total) by mouth daily before breakfast. 30 tablet 3   fexofenadine (ALLEGRA) 60 MG tablet Take 1 tablet (60 mg total) by mouth 2 (two) times daily. 30 tablet 1   fluconazole (DIFLUCAN) 150 MG tablet Take 1 tablet (150 mg total) by mouth daily.  (Patient not taking: Reported on 11/09/2023) 1 tablet 2   latanoprost (XALATAN) 0.005 % ophthalmic solution Place 1 drop into both eyes at bedtime.     metFORMIN (GLUCOPHAGE) 500 MG tablet TAKE 2 TABLETS TWICE DAILY 360 tablet 1   Multiple Vitamin (MULTIVITAMIN) tablet Take 1 tablet by mouth daily.     mupirocin ointment (BACTROBAN) 2 % Apply thin amount once or twice a day till healed (Patient not taking: Reported on 11/09/2023) 15 g 1   nitroGLYCERIN (NITROSTAT) 0.4 MG SL tablet DISSOLVE ONE TABLET UNDER THE TONGUE EVERY 5 MINUTES AS NEEDED FOR CHEST PAIN.  DO NOT EXCEED A TOTAL OF 3 DOSES IN 15 MINUTES 25 tablet 0   olopatadine (PATANOL) 0.1 % ophthalmic solution Place 1 drop into both eyes 2 (two) times daily. 15 mL 4   ONETOUCH ULTRA test strip USE 1 STRIP TO CHECK GLUCOSE ONCE DAILY 100 each 0   OVER THE COUNTER MEDICATION Magnesium 400mg  one twice a day     pantoprazole (PROTONIX) 40 MG tablet 1 daily as needed as directed 90 tablet 1   VENTOLIN HFA 108 (90 Base) MCG/ACT inhaler INHALE 2 PUFFS INTO THE LUNGS EVERY 4 (FOUR) HOURS AS NEEDED. 36 g 3   No current facility-administered medications for this visit.    REVIEW OF SYSTEMS (negative unless checked):   Cardiac:  []  Chest pain or chest pressure? []   Shortness of breath upon activity? []  Shortness of breath when lying flat? []  Irregular heart rhythm?  Vascular:  []  Pain in calf, thigh, or hip brought on by walking? []  Pain in feet at night that wakes you up from your sleep? []  Blood clot in your veins? []  Leg swelling?  Pulmonary:  []  Oxygen at home? []  Productive cough? []  Wheezing?  Neurologic:  []  Sudden weakness in arms or legs? []  Sudden numbness in arms or legs? []  Sudden onset of difficult speaking or slurred speech? []  Temporary loss of vision in one eye? []  Problems with dizziness?  Gastrointestinal:  []  Blood in stool? []  Vomited blood?  Genitourinary:  []  Burning when urinating? []  Blood in  urine?  Psychiatric:  []  Major depression  Hematologic:  []  Bleeding problems? []  Problems with blood clotting?  Dermatologic:  []  Rashes or ulcers?  Constitutional:  []  Fever or chills?  Ear/Nose/Throat:  []  Change in hearing? []  Nose bleeds? []  Sore throat?  Musculoskeletal:  []  Back pain? []  Joint pain? []  Muscle pain?   Physical Examination   Vitals:   01/10/24 1232 01/10/24 1234  BP: (!) 179/68 (!) 179/76  Pulse: 65   Resp: 18   Temp: 97.7 F (36.5 C)   TempSrc: Temporal   SpO2: 96%   Weight: 161 lb 12.8 oz (73.4 kg)   Height: 5\' 4"  (1.626 m)    Body mass index is 27.77 kg/m.  General:  WDWN in NAD; vital signs documented above Gait: Not observed HENT: WNL, normocephalic Pulmonary: normal non-labored breathing , without rales, rhonchi,  wheezing Cardiac: regular Abdomen: soft, NT, no masses Skin: without rashes Vascular Exam/Pulses: palpable radial pulses bilaterally Extremities: without ischemic changes, without gangrene , without cellulitis; without open wounds;  Musculoskeletal: no muscle wasting or atrophy  Neurologic: A&O X 3;  No focal weakness or paresthesias are detected Psychiatric:  The pt has Normal affect.  Non-Invasive Vascular Imaging   Bilateral Carotid Duplex (01/10/2024):  R ICA stenosis: patent stent without stenosis R VA:  patent and antegrade L ICA stenosis:  1-39% L VA:  patent and antegrade   Greater than 50% stenosis in the distal CCA  Medical Decision Making   Kara Kirby is a 78 y.o. female who presents for surveillance of carotid artery stenosis  Based on the patient's vascular studies, she has a patent right ICA stent without stenosis.  Her left ICA stenosis measures 1 to 39% today, previous measurements at 40 to 59%.  Duplex today does demonstrate greater than 50% stenosis in the distal right common carotid artery, which is new. She denies any strokelike symptoms such as slurred speech, facial droop,  sudden visual changes, or sudden weakness/numbness. She has no carotid bruits on exam.  She has palpable and equal radial pulses bilaterally Given that she has new stenosis in the distal right common carotid artery, we will bring her back for short interval follow-up for the time being.  She can follow-up with our office in 6 months with repeat carotid duplex   Loel Dubonnet PA-C [facility] Office: [phone]  Clinic MD: Lenell Antu

## 2024-01-12 ENCOUNTER — Other Ambulatory Visit: Payer: Self-pay

## 2024-01-12 DIAGNOSIS — I6523 Occlusion and stenosis of bilateral carotid arteries: Secondary | ICD-10-CM

## 2024-01-17 DIAGNOSIS — N189 Chronic kidney disease, unspecified: Secondary | ICD-10-CM | POA: Diagnosis not present

## 2024-01-24 LAB — VITAMIN D 25 HYDROXY (VIT D DEFICIENCY, FRACTURES)
A1c: 6.7
HM HIV Screening: NEGATIVE
HM Hepatitis Screen: NEGATIVE
PTH: 51
Vit D, 25-Hydroxy: 49.2

## 2024-01-24 LAB — BASIC METABOLIC PANEL WITH GFR
A1c: 6.8
BUN: 12 (ref 4–21)
BUN: 18 (ref 4–21)
CO2: 18 (ref 13–22)
CO2: 23 — AB (ref 13–22)
Chloride: 103 (ref 99–108)
Creatinine: 0.9 (ref 0.5–1.1)
Creatinine: 1 (ref 0.5–1.1)
Glucose: 124
Glucose: 136
Potassium: 3.4 meq/L — AB (ref 3.5–5.1)
Sodium: 143 (ref 137–147)

## 2024-01-24 LAB — CBC: RBC: 4.65 (ref 3.87–5.11)

## 2024-01-24 LAB — IRON,TIBC AND FERRITIN PANEL
%SAT: 30
Ferritin: 31
Iron: 54
TIBC: 180
UIBC: 126

## 2024-01-24 LAB — CBC AND DIFFERENTIAL
HCT: 46 (ref 36–46)
Hemoglobin: 14.3 (ref 12.0–16.0)
Platelets: 289 10*3/uL (ref 150–400)
WBC: 4.9

## 2024-01-24 LAB — HEMOGLOBIN A1C: Hemoglobin A1C: 6.7

## 2024-01-24 LAB — COMPREHENSIVE METABOLIC PANEL WITH GFR: Calcium: 9.6 (ref 8.7–10.7)

## 2024-01-25 ENCOUNTER — Encounter: Payer: Medicare HMO | Attending: Family Medicine | Admitting: Nutrition

## 2024-01-25 VITALS — Ht 64.0 in | Wt 161.0 lb

## 2024-01-25 DIAGNOSIS — E782 Mixed hyperlipidemia: Secondary | ICD-10-CM | POA: Insufficient documentation

## 2024-01-25 DIAGNOSIS — E118 Type 2 diabetes mellitus with unspecified complications: Secondary | ICD-10-CM | POA: Diagnosis not present

## 2024-01-25 DIAGNOSIS — I10 Essential (primary) hypertension: Secondary | ICD-10-CM | POA: Diagnosis not present

## 2024-01-25 NOTE — Progress Notes (Signed)
 Medical Nutrition Therapy:  Appt start time: 1115  end time:  1145 Assessment:  Primary concerns today: Diabetes Type 2. PCP: Dr. Fairy Homer      Last A1C 6.8%, down from 7.6%. She is eating better-more whole plant based foods. Cut out processed foods of bacon, sausage and fast foods. Cooks at home.  Started taking Magnesium  daily 400 mg per day BID for leg cramps. Still has some leg cramps from time to time. Referred to discuss with Dr. Fairy Homer. Had recent insect bite on leg. It is doing better. Will f/u with Dr. Fairy Homer in a few weeks.  Metformin  1000 mg BID Does yard work mowing yard and walks some for exercise.  Lab work looks better. LDL is 84. Mg/dl. EGFR is better at 61 mg/dl.   Wt Readings from Last 3 Encounters:  01/10/24 161 lb 12.8 oz (73.4 kg)  11/09/23 162 lb 12.8 oz (73.8 kg)  09/26/23 165 lb (74.8 kg)   Ht Readings from Last 3 Encounters:  01/10/24 5\' 4"  (1.626 m)  11/09/23 5\' 4"  (1.626 m)  09/26/23 5\' 4"  (1.626 m)   There is no height or weight on file to calculate BMI. @BMIFA @ Facility age limit for growth %iles is 20 years. Facility age limit for growth %iles is 20 years.        Latest Ref Rng & Units 09/16/2023    8:35 AM 08/08/2023    8:42 AM 03/21/2023    1:44 PM  CMP  Glucose 70 - 99 mg/dL 161  096  045   BUN 8 - 27 mg/dL 19  19  21    Creatinine 0.57 - 1.00 mg/dL 4.09  8.11  9.14   Sodium 134 - 144 mmol/L 145  144  142   Potassium 3.5 - 5.2 mmol/L 3.8  4.2  4.0   Chloride 96 - 106 mmol/L 107  102  103   CO2 20 - 29 mmol/L 22  26  25    Calcium  8.7 - 10.3 mg/dL 9.2  9.5  9.3   Total Protein 6.0 - 8.5 g/dL  6.5    Total Bilirubin 0.0 - 1.2 mg/dL  0.3    Alkaline Phos 44 - 121 IU/L  66    AST 0 - 40 IU/L  16    ALT 0 - 32 IU/L  16       Preferred Learning Style:  No preference indicated   Learning Readiness:   Ready Change in progress   MEDICATIONS: see list   DIETARY INTAKE:   24-hr recall:  B) Oatmeal, 1/2 banana and blueberries or  apples.   L) Bologna sandwich with tomatoes, lettuce, water   Nacho chips.   D) Chicken, brussel sprouts, and baked potato, water    Usual physical activity: walks,  Estimated energy needs: 1500  calories 170 g carbohydrates 112 g protein 42g fat  Progress Towards Goal(s):  In progress.Goals Nutritional Diagnosis:  NB-1.1 Food and nutrition-related knowledge deficit As related to Diabetes Type 2.  As evidenced by A1C .    Intervention:  Nutrition and Diabetes education provided on My Plate, CHO counting, meal planning, portion sizes, timing of meals, avoiding snacks between meals unless having a low blood sugar, target ranges for A1C and blood sugars, signs/symptoms and treatment of hyper/hypoglycemia, monitoring blood sugars, taking medications as prescribed, benefits of exercising 30 minutes per day and prevention of complications of DM. Aaron Aas Goals Use more herbs and spices to seasonings.- Avoid processed foods of fried foods, bologna Use vinegar  in foods Get A1C down to 6.5%  Teaching Method Utilized:   Auditory  Handouts given during visit include: Verbally went over Plate Method, CHO counting and meal planning   Barriers to learning/adherence to lifestyle change: none  Demonstrated degree of understanding via:  Teach Back   Monitoring/Evaluation:  Dietary intake, exercise, , and body weight in 6 months.Aaron Aas

## 2024-01-26 DIAGNOSIS — I129 Hypertensive chronic kidney disease with stage 1 through stage 4 chronic kidney disease, or unspecified chronic kidney disease: Secondary | ICD-10-CM | POA: Diagnosis not present

## 2024-01-26 DIAGNOSIS — R809 Proteinuria, unspecified: Secondary | ICD-10-CM | POA: Diagnosis not present

## 2024-01-26 DIAGNOSIS — E1122 Type 2 diabetes mellitus with diabetic chronic kidney disease: Secondary | ICD-10-CM | POA: Diagnosis not present

## 2024-01-26 DIAGNOSIS — I110 Hypertensive heart disease with heart failure: Secondary | ICD-10-CM | POA: Diagnosis not present

## 2024-01-26 DIAGNOSIS — N1831 Chronic kidney disease, stage 3a: Secondary | ICD-10-CM | POA: Diagnosis not present

## 2024-01-26 DIAGNOSIS — I5032 Chronic diastolic (congestive) heart failure: Secondary | ICD-10-CM | POA: Diagnosis not present

## 2024-01-26 DIAGNOSIS — N182 Chronic kidney disease, stage 2 (mild): Secondary | ICD-10-CM | POA: Diagnosis not present

## 2024-01-27 ENCOUNTER — Ambulatory Visit: Payer: Medicare HMO

## 2024-01-29 ENCOUNTER — Telehealth: Payer: Self-pay | Admitting: Family Medicine

## 2024-01-29 NOTE — Telephone Encounter (Signed)
 Nurses Please order lipid, liver, metabolic 7, A1c Diagnosis type 2 diabetes, hyperlipidemia, high risk medication, HTN Patient does not use MyChart Please notify her to do the blood work approximately 1 week before her office visit which is coming up in the first week of May Thank you-Dr. Geralyn Knee

## 2024-01-30 ENCOUNTER — Other Ambulatory Visit: Payer: Self-pay

## 2024-01-30 DIAGNOSIS — H401123 Primary open-angle glaucoma, left eye, severe stage: Secondary | ICD-10-CM | POA: Diagnosis not present

## 2024-01-30 DIAGNOSIS — Z7982 Long term (current) use of aspirin: Secondary | ICD-10-CM | POA: Diagnosis not present

## 2024-01-30 DIAGNOSIS — H401111 Primary open-angle glaucoma, right eye, mild stage: Secondary | ICD-10-CM | POA: Diagnosis not present

## 2024-01-30 DIAGNOSIS — E1169 Type 2 diabetes mellitus with other specified complication: Secondary | ICD-10-CM

## 2024-01-30 DIAGNOSIS — I10 Essential (primary) hypertension: Secondary | ICD-10-CM

## 2024-01-30 DIAGNOSIS — Z7902 Long term (current) use of antithrombotics/antiplatelets: Secondary | ICD-10-CM | POA: Diagnosis not present

## 2024-01-30 DIAGNOSIS — Z79899 Other long term (current) drug therapy: Secondary | ICD-10-CM

## 2024-01-30 DIAGNOSIS — E1159 Type 2 diabetes mellitus with other circulatory complications: Secondary | ICD-10-CM

## 2024-01-30 NOTE — Telephone Encounter (Signed)
 Message left to inform per drs notes , labs have been ordered

## 2024-01-31 ENCOUNTER — Encounter: Payer: Self-pay | Admitting: Nutrition

## 2024-02-06 DIAGNOSIS — Z79899 Other long term (current) drug therapy: Secondary | ICD-10-CM | POA: Diagnosis not present

## 2024-02-06 DIAGNOSIS — I10 Essential (primary) hypertension: Secondary | ICD-10-CM | POA: Diagnosis not present

## 2024-02-06 DIAGNOSIS — E1159 Type 2 diabetes mellitus with other circulatory complications: Secondary | ICD-10-CM | POA: Diagnosis not present

## 2024-02-06 DIAGNOSIS — E1169 Type 2 diabetes mellitus with other specified complication: Secondary | ICD-10-CM | POA: Diagnosis not present

## 2024-02-06 DIAGNOSIS — E785 Hyperlipidemia, unspecified: Secondary | ICD-10-CM | POA: Diagnosis not present

## 2024-02-07 LAB — BASIC METABOLIC PANEL WITH GFR
BUN/Creatinine Ratio: 16 (ref 12–28)
BUN: 14 mg/dL (ref 8–27)
CO2: 24 mmol/L (ref 20–29)
Calcium: 9.4 mg/dL (ref 8.7–10.3)
Chloride: 104 mmol/L (ref 96–106)
Creatinine, Ser: 0.88 mg/dL (ref 0.57–1.00)
Glucose: 135 mg/dL — ABNORMAL HIGH (ref 70–99)
Potassium: 4 mmol/L (ref 3.5–5.2)
Sodium: 144 mmol/L (ref 134–144)
eGFR: 67 mL/min/{1.73_m2} (ref >59)

## 2024-02-07 LAB — HEPATIC FUNCTION PANEL
ALT: 18 IU/L (ref 0–32)
AST: 17 IU/L (ref 0–40)
Albumin: 4 g/dL (ref 3.8–4.8)
Alkaline Phosphatase: 69 IU/L (ref 44–121)
Bilirubin Total: 0.4 mg/dL (ref 0.0–1.2)
Bilirubin, Direct: 0.14 mg/dL (ref 0.00–0.40)
Total Protein: 6.2 g/dL (ref 6.0–8.5)

## 2024-02-07 LAB — HEMOGLOBIN A1C
Est. average glucose Bld gHb Est-mCnc: 143 mg/dL
Hgb A1c MFr Bld: 6.6 % — ABNORMAL HIGH (ref 4.8–5.6)

## 2024-02-07 LAB — LIPID PANEL
Chol/HDL Ratio: 2.9 ratio (ref 0.0–4.4)
Cholesterol, Total: 136 mg/dL (ref 100–199)
HDL: 47 mg/dL (ref >39)
LDL Chol Calc (NIH): 75 mg/dL (ref 0–99)
Triglycerides: 71 mg/dL (ref 0–149)
VLDL Cholesterol Cal: 14 mg/dL (ref 5–40)

## 2024-02-14 ENCOUNTER — Ambulatory Visit: Payer: Medicare HMO | Admitting: Family Medicine

## 2024-02-14 ENCOUNTER — Encounter: Payer: Self-pay | Admitting: Family Medicine

## 2024-02-14 VITALS — BP 128/68 | HR 78 | Temp 98.1°F | Ht 64.0 in | Wt 159.0 lb

## 2024-02-14 DIAGNOSIS — E1159 Type 2 diabetes mellitus with other circulatory complications: Secondary | ICD-10-CM | POA: Diagnosis not present

## 2024-02-14 DIAGNOSIS — R809 Proteinuria, unspecified: Secondary | ICD-10-CM | POA: Diagnosis not present

## 2024-02-14 DIAGNOSIS — Z7984 Long term (current) use of oral hypoglycemic drugs: Secondary | ICD-10-CM

## 2024-02-14 DIAGNOSIS — E785 Hyperlipidemia, unspecified: Secondary | ICD-10-CM

## 2024-02-14 DIAGNOSIS — N189 Chronic kidney disease, unspecified: Secondary | ICD-10-CM | POA: Diagnosis not present

## 2024-02-14 DIAGNOSIS — I10 Essential (primary) hypertension: Secondary | ICD-10-CM

## 2024-02-14 DIAGNOSIS — E1169 Type 2 diabetes mellitus with other specified complication: Secondary | ICD-10-CM

## 2024-02-14 MED ORDER — EZETIMIBE 10 MG PO TABS: 10.0000 mg | ORAL_TABLET | Freq: Every day | ORAL | 3 refills | Status: DC

## 2024-02-14 MED ORDER — FLUCONAZOLE 150 MG PO TABS: 150.0000 mg | ORAL_TABLET | Freq: Every day | ORAL | 2 refills | Status: DC

## 2024-02-14 NOTE — Progress Notes (Signed)
 Subjective:    Patient ID: Kara Kirby, female    DOB: [birth year], 78 y.o.   MRN: [id number]  HPI DM follow up  History of Present Illness   MERCER Kirby is a 78 year old female with diabetes and hypertension who presents for follow-up regarding proteinuria and neuropathic pain.  She has a history of diabetes and hypertension. A recent 24-hour urine test showed a moderate amount of proteinuria. She is currently taking Jardiance  to help reduce proteinuria and hydrochlorothiazide , which causes frequent urination.  She experiences neuropathic pain described as 'pins in the toes,' which started after she began drinking a flavored water . The pain has lessened recently and is not severe enough to warrant treatment.  She has a history of carotid artery disease with a stent placed on the right side. Recent imaging showed a 50% stenosis on the left side, below the original stent. She is currently taking atorvastatin  80 mg for cholesterol management, with recent labs showing an LDL of 75 mg/dL.  She mentions a urinary tract infection for which she is currently taking medication. Initially, she did not feel symptoms but reports feeling them now.  She notes a small bump under her arm, which she describes as possibly a skin tag. She has been scratching it but is unsure of its nature.  Her social history includes attending two funerals recently, which she found emotionally taxing. She remains active and involved in her community, despite her health issues.     She does relate some intermittent sharp pains in the feet that is more likely a mild neuropathy issue but this only started a couple days ago she will watch this  Results for orders placed or performed in visit on 01/30/24  Lipid Panel   Collection Time: 02/06/24  8:11 AM  Result Value Ref Range   Cholesterol, Total 136 100 - 199 mg/dL   Triglycerides 71 0 - 149 mg/dL   HDL 47 >40 mg/dL   VLDL Cholesterol Cal 14 5 - 40 mg/dL    LDL Chol Calc (NIH) 75 0 - 99 mg/dL   Chol/HDL Ratio 2.9 0.0 - 4.4 ratio  Hepatic Function Panel   Collection Time: 02/06/24  8:11 AM  Result Value Ref Range   Total Protein 6.2 6.0 - 8.5 g/dL   Albumin 4.0 3.8 - 4.8 g/dL   Bilirubin Total 0.4 0.0 - 1.2 mg/dL   Bilirubin, Direct 9.81 0.00 - 0.40 mg/dL   Alkaline Phosphatase 69 44 - 121 IU/L   AST 17 0 - 40 IU/L   ALT 18 0 - 32 IU/L  Basic Metabolic Panel   Collection Time: 02/06/24  8:11 AM  Result Value Ref Range   Glucose 135 (H) 70 - 99 mg/dL   BUN 14 8 - 27 mg/dL   Creatinine, Ser 1.91 0.57 - 1.00 mg/dL   eGFR 67 >47 WG/NFA/2.13   BUN/Creatinine Ratio 16 12 - 28   Sodium 144 134 - 144 mmol/L   Potassium 4.0 3.5 - 5.2 mmol/L   Chloride 104 96 - 106 mmol/L   CO2 24 20 - 29 mmol/L   Calcium  9.4 8.7 - 10.3 mg/dL  Hemoglobin Y8M   Collection Time: 02/06/24  8:11 AM  Result Value Ref Range   Hgb A1c MFr Bld 6.6 (H) 4.8 - 5.6 %   Est. average glucose Bld gHb Est-mCnc 143 mg/dL    Labs reviewed will also share with Dr. Carrolyn Clan Review of Systems     Objective:  Physical Exam  General-in no acute distress Eyes-no discharge Lungs-respiratory rate normal, CTA CV-no murmurs,RRR Extremities skin warm dry no edema Neuro grossly normal Behavior normal, alert  Diabetic foot exam today normal      Assessment & Plan:  Assessment and Plan    Diabetes mellitus with proteinuria Moderate proteinuria managed with Jardiance  and hydrochlorothiazide . Emphasized hydration and dietary modifications to support kidney health. Medication reduces kidney deterioration risk by 40-50%. - Continue Jardiance . - Continue hydrochlorothiazide . - Encourage hydration, especially in hot weather. - Advise reducing red meat intake. - Encourage increased intake of fruits and vegetables.  Urinary tract infection Currently on antibiotics with ongoing symptoms. Emphasized completing the antibiotic course. - Continue current antibiotic  regimen.  Hyperlipidemia LDL cholesterol at 75 mg/dL, above target. Discussed adding ezetimibe to further lower LDL and prevent cardiovascular issues. Ezetimibe is cost-effective. - Add ezetimibe 10 mg daily. - Continue atorvastatin  80 mg daily.  Carotid artery stenosis Left carotid artery stenosis is low range. No immediate intervention needed. Follow-up imaging in six months. Emphasized LDL management. - Schedule follow-up imaging in six months. - Manage LDL cholesterol with atorvastatin  and ezetimibe.  Neuropathic pain Intermittent neuropathic pain in toes, symptoms lessened. No current medication needed unless symptoms worsen. Discussed potential treatment options. - Monitor neuropathic pain symptoms. - Consider medication if symptoms become significantly problematic.     Share results of all of this and labs with Dr. Lavonda Pour patient to follow-up in October

## 2024-02-20 ENCOUNTER — Other Ambulatory Visit: Payer: Self-pay | Admitting: Family Medicine

## 2024-02-22 ENCOUNTER — Other Ambulatory Visit: Payer: Self-pay | Admitting: Family Medicine

## 2024-02-22 DIAGNOSIS — E1159 Type 2 diabetes mellitus with other circulatory complications: Secondary | ICD-10-CM

## 2024-02-27 ENCOUNTER — Ambulatory Visit: Payer: Self-pay | Admitting: Family Medicine

## 2024-03-02 ENCOUNTER — Encounter: Payer: Self-pay | Admitting: Nephrology

## 2024-03-08 DIAGNOSIS — E1136 Type 2 diabetes mellitus with diabetic cataract: Secondary | ICD-10-CM | POA: Diagnosis not present

## 2024-03-08 DIAGNOSIS — H401123 Primary open-angle glaucoma, left eye, severe stage: Secondary | ICD-10-CM | POA: Diagnosis not present

## 2024-03-08 DIAGNOSIS — E113293 Type 2 diabetes mellitus with mild nonproliferative diabetic retinopathy without macular edema, bilateral: Secondary | ICD-10-CM | POA: Diagnosis not present

## 2024-03-08 DIAGNOSIS — H401111 Primary open-angle glaucoma, right eye, mild stage: Secondary | ICD-10-CM | POA: Diagnosis not present

## 2024-03-08 DIAGNOSIS — H34832 Tributary (branch) retinal vein occlusion, left eye, with macular edema: Secondary | ICD-10-CM | POA: Diagnosis not present

## 2024-03-08 DIAGNOSIS — H25813 Combined forms of age-related cataract, bilateral: Secondary | ICD-10-CM | POA: Diagnosis not present

## 2024-03-16 DIAGNOSIS — H524 Presbyopia: Secondary | ICD-10-CM | POA: Diagnosis not present

## 2024-03-16 DIAGNOSIS — H52222 Regular astigmatism, left eye: Secondary | ICD-10-CM | POA: Diagnosis not present

## 2024-04-06 ENCOUNTER — Other Ambulatory Visit: Payer: Self-pay | Admitting: Family Medicine

## 2024-05-04 ENCOUNTER — Other Ambulatory Visit: Payer: Self-pay | Admitting: Family Medicine

## 2024-05-04 DIAGNOSIS — I70219 Atherosclerosis of native arteries of extremities with intermittent claudication, unspecified extremity: Secondary | ICD-10-CM

## 2024-05-04 DIAGNOSIS — I10 Essential (primary) hypertension: Secondary | ICD-10-CM

## 2024-05-18 ENCOUNTER — Other Ambulatory Visit: Payer: Self-pay

## 2024-05-18 DIAGNOSIS — I70219 Atherosclerosis of native arteries of extremities with intermittent claudication, unspecified extremity: Secondary | ICD-10-CM

## 2024-05-18 DIAGNOSIS — E1159 Type 2 diabetes mellitus with other circulatory complications: Secondary | ICD-10-CM

## 2024-05-18 DIAGNOSIS — E1169 Type 2 diabetes mellitus with other specified complication: Secondary | ICD-10-CM

## 2024-05-18 DIAGNOSIS — N189 Chronic kidney disease, unspecified: Secondary | ICD-10-CM

## 2024-05-18 DIAGNOSIS — I700 Atherosclerosis of aorta: Secondary | ICD-10-CM

## 2024-05-21 ENCOUNTER — Ambulatory Visit: Payer: Self-pay

## 2024-05-21 ENCOUNTER — Encounter: Attending: Family Medicine | Admitting: Nutrition

## 2024-05-21 ENCOUNTER — Telehealth: Payer: Self-pay

## 2024-05-21 VITALS — Ht 64.0 in | Wt 157.0 lb

## 2024-05-21 DIAGNOSIS — E782 Mixed hyperlipidemia: Secondary | ICD-10-CM | POA: Insufficient documentation

## 2024-05-21 DIAGNOSIS — I10 Essential (primary) hypertension: Secondary | ICD-10-CM | POA: Insufficient documentation

## 2024-05-21 DIAGNOSIS — E118 Type 2 diabetes mellitus with unspecified complications: Secondary | ICD-10-CM | POA: Insufficient documentation

## 2024-05-21 NOTE — Progress Notes (Signed)
 Medical Nutrition Therapy:  Appt start time: 1105  end time:  1145 Assessment:  Primary concerns today: Diabetes Type 2. PCP: Dr. Alphonsa    Jardiance  and Metformin . Has cut down on diet soda. Drinking more water . >>>>She does note she has some burning during urination recently.>>>>> Encouraged her to contact PCP to discuss for treatment.  Lost 2 lbs. Mowing her yard for exercise. Goals set previously. Use more herbs and spices to seasonings.-done Avoid processed foods of fried foods, bologna-has reduced. Scheduled to see Dr. Glendia in 2 months.   Wt Readings from Last 3 Encounters:  05/23/24 158 lb 3.2 oz (71.8 kg)  05/21/24 157 lb (71.2 kg)  02/14/24 159 lb (72.1 kg)   Ht Readings from Last 3 Encounters:  05/23/24 5' 4 (1.626 m)  05/21/24 5' 4 (1.626 m)  02/14/24 5' 4 (1.626 m)   Body mass index is 26.95 kg/m. @BMIFA @ Facility age limit for growth %iles is 20 years. Facility age limit for growth %iles is 20 years.        Latest Ref Rng & Units 02/06/2024    8:11 AM 01/24/2024   12:00 AM 10/19/2023   12:00 AM  CMP  Glucose 70 - 99 mg/dL 864     BUN 8 - 27 mg/dL 14  18       12        Creatinine 0.57 - 1.00 mg/dL 9.11  1.0       0.9       Sodium 134 - 144 mmol/L 144  143       Potassium 3.5 - 5.2 mmol/L 4.0  3.4       Chloride 96 - 106 mmol/L 104  103       CO2 20 - 29 mmol/L 24  23       18        Calcium  8.7 - 10.3 mg/dL 9.4  9.6     9.4      Total Protein 6.0 - 8.5 g/dL 6.2     Total Bilirubin 0.0 - 1.2 mg/dL 0.4     Alkaline Phos 44 - 121 IU/L 69     AST 0 - 40 IU/L 17     ALT 0 - 32 IU/L 18        This result is from an external source.     Preferred Learning Style:  No preference indicated   Learning Readiness:   Ready Change in progress   MEDICATIONS: see list   DIETARY INTAKE:   24-hr recall:  B) Oatmeal, 1/2 banana and blueberries or apples.   L) Bologna sandwich with tomatoes, lettuce, water   Nacho chips.   D) Chicken, brussel  sprouts, and baked potato, water    Usual physical activity: walks,  Estimated energy needs: 1500  calories 170 g carbohydrates 112 g protein 42g fat  Progress Towards Goal(s):  In progress.Goals Nutritional Diagnosis:  NB-1.1 Food and nutrition-related knowledge deficit As related to Diabetes Type 2.  As evidenced by A1C .    Intervention:  Nutrition and Diabetes education provided on My Plate, CHO counting, meal planning, portion sizes, timing of meals, avoiding snacks between meals unless having a low blood sugar, target ranges for A1C and blood sugars, signs/symptoms and treatment of hyper/hypoglycemia, monitoring blood sugars, taking medications as prescribed, benefits of exercising 30 minutes per day and prevention of complications of DM. SABRA Goals Keep up the great job! Keep eating foods that grown in a garden. Keep drinking water  Get A1C  to 6% Teaching Method Utilized:   Auditory  Handouts given during visit include: Verbally went over Plate Method, CHO counting and meal planning   Barriers to learning/adherence to lifestyle change: none  Demonstrated degree of understanding via:  Teach Back   Monitoring/Evaluation:  Dietary intake, exercise, , and body weight in 6 months.SABRA

## 2024-05-21 NOTE — Telephone Encounter (Signed)
 Appt scheduled

## 2024-05-21 NOTE — Telephone Encounter (Signed)
 Caller requesting Dietician referral for Diabetes following up and chronic kidney disease,

## 2024-05-21 NOTE — Telephone Encounter (Signed)
 Please advise. Patient refused UC, scheduled for soonest available in office visit on 8/13. Added to waitlist for sooner appt if possible d/t comorbidities.  FYI Only or Action Required?: Action required by provider: request for appointment, clinical question for provider, and update on patient condition.  Patient was last seen in primary care on 02/14/2024 by Alphonsa Glendia LABOR, MD.  Called Nurse Triage reporting No chief complaint on file..  Symptoms began a week ago.  Interventions attempted: Nothing.  Symptoms are: unchanged.  Triage Disposition: See Physician Within 24 Hours  Patient/caregiver understands and will follow disposition?: No, refuses disposition  Reason for Disposition  Urinating more frequently than usual (i.e., frequency) OR new-onset of the feeling of an urgent need to urinate (i.e., urgency)  Answer Assessment - Initial Assessment Questions 1. SYMPTOM: What's the main symptom you're concerned about? (e.g., frequency, incontinence)     Burning and frequent urination.   2. ONSET     1 week  3. PAIN: Is there any pain? If Yes, ask: How bad is it? (Scale: 1-10; mild, moderate, severe)     Burning  4. CAUSE: What do you think is causing the symptoms?     Unknown  5. OTHER SYMPTOMS: Do you have any other symptoms? (e.g., blood in urine, fever, flank pain, pain with urination)     Nausea last Monday, states was due to fish she ate. No nausea since. Denies flank pain or other symptoms.  Protocols used: Urinary Symptoms-A-AH

## 2024-05-21 NOTE — Telephone Encounter (Signed)
   Summary: burning when urinating   [facility] Dietician [phone]  Called patient is experiencing burning when urinating for the last 3 days. Caller would like PCP to follow up with patient directly regarding symptoms.

## 2024-05-23 ENCOUNTER — Ambulatory Visit (INDEPENDENT_AMBULATORY_CARE_PROVIDER_SITE_OTHER): Admitting: Physician Assistant

## 2024-05-23 ENCOUNTER — Encounter: Payer: Self-pay | Admitting: Physician Assistant

## 2024-05-23 VITALS — BP 164/82 | HR 59 | Temp 98.4°F | Ht 64.0 in | Wt 158.2 lb

## 2024-05-23 DIAGNOSIS — R350 Frequency of micturition: Secondary | ICD-10-CM | POA: Diagnosis not present

## 2024-05-23 LAB — POCT URINALYSIS DIP (CLINITEK)
Bilirubin, UA: NEGATIVE
Glucose, UA: >=1000 mg/dL — AB
Ketones, POC UA: NEGATIVE mg/dL
Leukocytes, UA: NEGATIVE
Nitrite, UA: NEGATIVE
POC PROTEIN,UA: 100 — AB
Spec Grav, UA: 1.01 (ref 1.010–1.025)
Urobilinogen, UA: 0.2 U/dL
pH, UA: 7.5 (ref 5.0–8.0)

## 2024-05-23 NOTE — Patient Instructions (Signed)
 Keep up the great job! Keep eating foods that grown in a garden. Keep drinking water  Get A1C to 6%

## 2024-05-23 NOTE — Progress Notes (Signed)
 Acute Office Visit  Subjective:     Patient ID: Kara Kirby, female    DOB: 1946/01/27, 78 y.o.   MRN: [id number]   Patient presents today for concerns of increased urination. Denies fevers, dysuria, hematuria, abdominal pain, or flank pain. State she was recently started on hydrochlorothiazide  from her nephrologist. Patient also takes Jardiance  daily for management of diabetes.   Additionally, patient is wondering about sparkling flavored waters. States she wants to be sure she can drink them and they don't have caffeine. Patient assured sparkling waters are okay to drink, specifically for patients with diabetes due to no added sugars.      Review of Systems  Constitutional:  Negative for chills, fever, malaise/fatigue and weight loss.  HENT:  Positive for congestion. Negative for sore throat.   Eyes:  Negative for blurred vision and double vision.  Respiratory:  Negative for cough and shortness of breath.   Cardiovascular:  Negative for chest pain and palpitations.  Gastrointestinal:  Negative for abdominal pain, nausea and vomiting.  Genitourinary:  Positive for frequency. Negative for dysuria, flank pain, hematuria and urgency.  Neurological:  Negative for dizziness and headaches.        Objective:     BP (!) 164/82   Pulse (!) 59   Temp 98.4 F (36.9 C)   Ht 5' 4 (1.626 m)   Wt 158 lb 3.2 oz (71.8 kg)   SpO2 98%   BMI 27.15 kg/m   Physical Exam Constitutional:      Appearance: Normal appearance.  HENT:     Head: Normocephalic.     Mouth/Throat:     Mouth: Mucous membranes are moist.     Pharynx: Oropharynx is clear.  Eyes:     Extraocular Movements: Extraocular movements intact.     Conjunctiva/sclera: Conjunctivae normal.  Cardiovascular:     Rate and Rhythm: Normal rate and regular rhythm.     Heart sounds: Normal heart sounds. No murmur heard. Pulmonary:     Effort: Pulmonary effort is normal.     Breath sounds: Normal breath sounds. No  wheezing or rales.  Abdominal:     Tenderness: There is no abdominal tenderness. There is no right CVA tenderness or left CVA tenderness.  Musculoskeletal:     Right lower leg: No edema.     Left lower leg: No edema.  Skin:    General: Skin is warm and dry.  Neurological:     General: No focal deficit present.     Mental Status: She is alert and oriented to person, place, and time.  Psychiatric:        Mood and Affect: Mood normal.        Behavior: Behavior normal.     Results for orders placed or performed in visit on 05/23/24  POCT URINALYSIS DIP (CLINITEK)  Result Value Ref Range   Color, UA yellow yellow   Clarity, UA clear clear   Glucose, UA >=1,000 (A) negative mg/dL   Bilirubin, UA negative negative   Ketones, POC UA negative negative mg/dL   Spec Grav, UA 8.989 8.989 - 1.025   Blood, UA trace-lysed (A) negative   pH, UA 7.5 5.0 - 8.0   POC PROTEIN,UA =100 (A) negative, trace   Urobilinogen, UA 0.2 0.2 or 1.0 E.U./dL   Nitrite, UA Negative Negative   Leukocytes, UA Negative Negative        Assessment & Plan:  Urinary frequency Assessment & Plan: Patient presents today with concerns for  increased urination. Denies dysuria, fever, nausea, vomiting, abdominal pain, or flank pain. Urine dipstick significant for glucose and trace blood. I except urine glucose is related to diabetes and Jardiance  use. I suspect increased urination secondary to recent diuretic use. Patient appears well today. Reassuring physical exam. Warning signs and follow up precautions discussed.   Orders: -     POCT URINALYSIS DIP (CLINITEK)   Return if symptoms worsen or fail to improve.  Kara Taevon Aschoff, PA-C

## 2024-05-23 NOTE — Assessment & Plan Note (Signed)
 Patient presents today with concerns for increased urination. Denies dysuria, fever, nausea, vomiting, abdominal pain, or flank pain. Urine dipstick significant for glucose and trace blood. I except urine glucose is related to diabetes and Jardiance  use. I suspect increased urination secondary to recent diuretic use. Patient appears well today. Reassuring physical exam. Warning signs and follow up precautions discussed.

## 2024-05-29 ENCOUNTER — Other Ambulatory Visit: Payer: Self-pay

## 2024-05-29 DIAGNOSIS — E1159 Type 2 diabetes mellitus with other circulatory complications: Secondary | ICD-10-CM

## 2024-05-29 DIAGNOSIS — Z79899 Other long term (current) drug therapy: Secondary | ICD-10-CM

## 2024-05-29 DIAGNOSIS — E1169 Type 2 diabetes mellitus with other specified complication: Secondary | ICD-10-CM

## 2024-06-18 DIAGNOSIS — H401111 Primary open-angle glaucoma, right eye, mild stage: Secondary | ICD-10-CM | POA: Diagnosis not present

## 2024-06-18 DIAGNOSIS — H401123 Primary open-angle glaucoma, left eye, severe stage: Secondary | ICD-10-CM | POA: Diagnosis not present

## 2024-07-02 DIAGNOSIS — E1159 Type 2 diabetes mellitus with other circulatory complications: Secondary | ICD-10-CM | POA: Diagnosis not present

## 2024-07-02 DIAGNOSIS — Z79899 Other long term (current) drug therapy: Secondary | ICD-10-CM | POA: Diagnosis not present

## 2024-07-02 DIAGNOSIS — E1169 Type 2 diabetes mellitus with other specified complication: Secondary | ICD-10-CM | POA: Diagnosis not present

## 2024-07-02 DIAGNOSIS — E785 Hyperlipidemia, unspecified: Secondary | ICD-10-CM | POA: Diagnosis not present

## 2024-07-03 ENCOUNTER — Ambulatory Visit: Payer: Self-pay | Admitting: Family Medicine

## 2024-07-03 LAB — BASIC METABOLIC PANEL WITH GFR
BUN/Creatinine Ratio: 16 (ref 12–28)
BUN: 16 mg/dL (ref 8–27)
CO2: 26 mmol/L (ref 20–29)
Calcium: 9.4 mg/dL (ref 8.7–10.3)
Chloride: 101 mmol/L (ref 96–106)
Creatinine, Ser: 0.97 mg/dL (ref 0.57–1.00)
Glucose: 163 mg/dL — ABNORMAL HIGH (ref 70–99)
Potassium: 4 mmol/L (ref 3.5–5.2)
Sodium: 142 mmol/L (ref 134–144)
eGFR: 59 mL/min/1.73 — ABNORMAL LOW (ref >59)

## 2024-07-03 LAB — MICROALBUMIN / CREATININE URINE RATIO
Creatinine, Urine: 46.2 mg/dL
Microalb/Creat Ratio: 1100 mg/g{creat} — ABNORMAL HIGH (ref 0–29)
Microalbumin, Urine: 508.3 ug/mL

## 2024-07-03 LAB — LIPID PANEL
Chol/HDL Ratio: 2.7 ratio (ref 0.0–4.4)
Cholesterol, Total: 125 mg/dL (ref 100–199)
HDL: 46 mg/dL (ref >39)
LDL Chol Calc (NIH): 65 mg/dL (ref 0–99)
Triglycerides: 65 mg/dL (ref 0–149)
VLDL Cholesterol Cal: 14 mg/dL (ref 5–40)

## 2024-07-03 LAB — HEPATIC FUNCTION PANEL
ALT: 19 IU/L (ref 0–32)
AST: 18 IU/L (ref 0–40)
Albumin: 4.4 g/dL (ref 3.8–4.8)
Alkaline Phosphatase: 59 IU/L (ref 49–135)
Bilirubin Total: 0.6 mg/dL (ref 0.0–1.2)
Bilirubin, Direct: 0.2 mg/dL (ref 0.00–0.40)
Total Protein: 6.7 g/dL (ref 6.0–8.5)

## 2024-07-03 LAB — HEMOGLOBIN A1C
Est. average glucose Bld gHb Est-mCnc: 154 mg/dL
Hgb A1c MFr Bld: 7 % — ABNORMAL HIGH (ref 4.8–5.6)

## 2024-07-04 NOTE — Telephone Encounter (Signed)
 Labs sent to Dr Rachele.

## 2024-07-10 ENCOUNTER — Ambulatory Visit (HOSPITAL_COMMUNITY)
Admission: RE | Admit: 2024-07-10 | Discharge: 2024-07-10 | Disposition: A | Discharge status: Home / Self Care | Source: Ambulatory Visit | Attending: Vascular Surgery | Admitting: Vascular Surgery

## 2024-07-10 ENCOUNTER — Ambulatory Visit

## 2024-07-10 DIAGNOSIS — I6523 Occlusion and stenosis of bilateral carotid arteries: Secondary | ICD-10-CM | POA: Diagnosis not present

## 2024-07-13 ENCOUNTER — Encounter: Payer: Self-pay | Admitting: Family Medicine

## 2024-07-13 ENCOUNTER — Ambulatory Visit: Admitting: Family Medicine

## 2024-07-13 VITALS — BP 138/86 | HR 71 | Ht 64.0 in | Wt 157.0 lb

## 2024-07-13 DIAGNOSIS — R159 Full incontinence of feces: Secondary | ICD-10-CM

## 2024-07-13 DIAGNOSIS — E1169 Type 2 diabetes mellitus with other specified complication: Secondary | ICD-10-CM

## 2024-07-13 DIAGNOSIS — R0981 Nasal congestion: Secondary | ICD-10-CM

## 2024-07-13 DIAGNOSIS — E1159 Type 2 diabetes mellitus with other circulatory complications: Secondary | ICD-10-CM | POA: Diagnosis not present

## 2024-07-13 DIAGNOSIS — N189 Chronic kidney disease, unspecified: Secondary | ICD-10-CM

## 2024-07-13 DIAGNOSIS — E785 Hyperlipidemia, unspecified: Secondary | ICD-10-CM

## 2024-07-13 DIAGNOSIS — N1831 Chronic kidney disease, stage 3a: Secondary | ICD-10-CM | POA: Diagnosis not present

## 2024-07-13 DIAGNOSIS — R809 Proteinuria, unspecified: Secondary | ICD-10-CM | POA: Diagnosis not present

## 2024-07-13 NOTE — Progress Notes (Signed)
   Subjective:    Patient ID: Kara Kirby, female    DOB: 05-23-1946, 78 y.o.   MRN: [id number]  HPI Follow up for DM, HTN, hyperlipidemia The patient was seen today as part of a comprehensive diabetic check up. Patient has diabetes Patient relates good compliance with taking the medication. We discussed their diet and exercise activities  We also discussed the importance of notifying us  if any excessively high glucoses or low sugars.    Patient here for follow-up regarding cholesterol.    Patient relates taking medication on a regular basis Denies problems with medication Importance of dietary measures discussed Regular lab work regarding lipid and liver was checked and if needing additional labs was appropriately ordered  Patient for blood pressure check up.  The patient does have hypertension.   Patient relates dietary measures try to minimize salt The importance of healthy diet and activity were discussed Patient relates compliance  She also has significant history of allergies and asthma and coughing She also relates that she has a lot of encopresis problems she states that this was triggered after having a traumatic delivery when she was 79 years old She describes having severe bleeding at the time of delivery having to go to the hospital and an emergency getting as she states 13 units of blood and she had a episiotomy that she says went all the way to her rectum in order to deliver the child afterwards she seemed to do okay for a while but more recently she states that she cannot hold her stools the way she should and she has leakage and she relates it is embarrassing Review of Systems     Objective:   Physical Exam General-in no acute distress Eyes-no discharge Lungs-respiratory rate normal, CTA CV-no murmurs,RRR Extremities skin warm dry no edema Neuro grossly normal Behavior normal, alert        Assessment & Plan:  1. Hyperlipidemia associated with type  2 diabetes mellitus (HCC) (Primary) Continue current medication healthy diet  2. Type 2 diabetes mellitus with other circulatory complication, without long-term current use of insulin  (HCC) Continue current medication A1c acceptable  3. Chronic kidney disease, unspecified CKD stage On Jardiance  continue this sees nephrologist later in October  4. Proteinuria, unspecified type Follow-up with nephrologist  5. Encopresis Having intermittent spells of encopresis Will set up with general surgery for consultation Patient states when she had her gynecologic surgery with a newborn baby when she was 36 years old she had extensive episiotomy that she thinks has caused her ongoing rectal issues and now she states that she feels she is having leakage with that question fistula?  6. Sinus congestion Uses OTC allergy medicines no asthma flareup currently  7. Chronic kidney disease, stage 3a (HCC) See discussion above  Follow-up in approximately 4 months

## 2024-07-16 DIAGNOSIS — N189 Chronic kidney disease, unspecified: Secondary | ICD-10-CM | POA: Diagnosis not present

## 2024-07-16 DIAGNOSIS — D631 Anemia in chronic kidney disease: Secondary | ICD-10-CM | POA: Diagnosis not present

## 2024-07-16 DIAGNOSIS — R809 Proteinuria, unspecified: Secondary | ICD-10-CM | POA: Diagnosis not present

## 2024-07-17 ENCOUNTER — Telehealth: Payer: Self-pay | Admitting: Family Medicine

## 2024-07-17 NOTE — Telephone Encounter (Signed)
 Provider requested we send most recent labs to Dr. [facility] in [place]. I have faxed most recent labs.

## 2024-07-18 ENCOUNTER — Other Ambulatory Visit: Payer: Self-pay

## 2024-07-18 DIAGNOSIS — R159 Full incontinence of feces: Secondary | ICD-10-CM

## 2024-07-18 LAB — LAB REPORT - SCANNED
Creatinine, POC: 43.7 mg/dL
EGFR: 55

## 2024-07-23 ENCOUNTER — Other Ambulatory Visit: Payer: Self-pay | Admitting: Family Medicine

## 2024-07-26 DIAGNOSIS — R809 Proteinuria, unspecified: Secondary | ICD-10-CM | POA: Diagnosis not present

## 2024-07-26 DIAGNOSIS — N1831 Chronic kidney disease, stage 3a: Secondary | ICD-10-CM | POA: Diagnosis not present

## 2024-07-26 DIAGNOSIS — E1122 Type 2 diabetes mellitus with diabetic chronic kidney disease: Secondary | ICD-10-CM | POA: Diagnosis not present

## 2024-07-26 DIAGNOSIS — I129 Hypertensive chronic kidney disease with stage 1 through stage 4 chronic kidney disease, or unspecified chronic kidney disease: Secondary | ICD-10-CM | POA: Diagnosis not present

## 2024-07-27 ENCOUNTER — Telehealth: Payer: Self-pay

## 2024-07-27 NOTE — Telephone Encounter (Signed)
 Prescription Request  07/27/2024  LOV: Visit date not found  What is the name of the medication or equipment?     JARDIANCE  10 MG TABS tablet ezetimibe  (ZETIA ) 10 MG tablet  Have you contacted your pharmacy to request a refill? Yes   Which pharmacy would you like this sent to? Patient notified that their request is being sent to the clinical staff for review and that they should receive a response within 2 business days.   Please advise at Mobile [phone] (mobile) +

## 2024-07-30 ENCOUNTER — Other Ambulatory Visit: Payer: Self-pay | Admitting: Nurse Practitioner

## 2024-07-30 ENCOUNTER — Ambulatory Visit: Attending: Surgery | Admitting: Physician Assistant

## 2024-07-30 VITALS — BP 165/89 | HR 70 | Temp 98.3°F | Ht 64.0 in | Wt 157.0 lb

## 2024-07-30 DIAGNOSIS — I6522 Occlusion and stenosis of left carotid artery: Secondary | ICD-10-CM

## 2024-07-30 DIAGNOSIS — E1159 Type 2 diabetes mellitus with other circulatory complications: Secondary | ICD-10-CM

## 2024-07-30 DIAGNOSIS — I6521 Occlusion and stenosis of right carotid artery: Secondary | ICD-10-CM | POA: Diagnosis not present

## 2024-07-30 MED ORDER — EMPAGLIFLOZIN 10 MG PO TABS: ORAL_TABLET | ORAL | 3 refills | Status: AC

## 2024-07-30 MED ORDER — EZETIMIBE 10 MG PO TABS: 10.0000 mg | ORAL_TABLET | Freq: Every day | ORAL | 3 refills | Status: AC

## 2024-07-30 NOTE — Progress Notes (Signed)
 Office Note     CC:  follow up Requesting Provider:  Alphonsa Glendia LABOR, MD  HPI: Kara Kirby is a 78 y.o. (12-08-1945) female who presents for routine follow up of carotid artery stenosis. She has a history of right carotid endarterectomy by Dr. Eliza in 2004 for symptomatic stenosis. She subsequently required transfemoral right ICA stenting due to recurrent asymptomatic stenosis by Dr. Harvey in 2017.   Today she denies any slurred speech, facial drooping, visual changes, unilateral upper or lower extremity weakness or numbness. She remains fairly active. Exercise often and mows her yard without difficulty. She is compliant with her Aspirin , Statin and Plavix .   Past Medical History:  Diagnosis Date   Allergy    Arthritis    Left shoulder   Carotid artery occlusion    CVA (cerebral vascular accident) (HCC) 02-19-13   DM (diabetes mellitus) (HCC)    Dyslipidemia    GERD (gastroesophageal reflux disease)    intermittent Sx   Hematuria    Hip arthritis 2003   Left   HTN (hypertension)    Hyperlipidemia    Renal insufficiency, mild    Sleep related leg cramps     Past Surgical History:  Procedure Laterality Date   CAROTID ENDARTERECTOMY  RIGHT   COLONOSCOPY     COLONOSCOPY  10/19/2011   Procedure: COLONOSCOPY;  Surgeon: Margo CHRISTELLA Harvey, MD;  [facility];  Service: Endoscopy;  Laterality: N/A;  10:15   COLONOSCOPY N/A 08/16/2018   Procedure: COLONOSCOPY;  Surgeon: Harvey Margo CROME, MD;  Location: [facility];  Service: Endoscopy;  Laterality: N/A;  9:30am   PERIPHERAL VASCULAR CATHETERIZATION Right 10/17/2015   Procedure: Carotid PTA/Stent Intervention;  Surgeon: Carlin FORBES Harvey, MD;  Location: [facility] INVASIVE CV LAB;  Service: Cardiovascular;  Laterality: Right;   PERIPHERAL VASCULAR CATHETERIZATION Right 10/17/2015   Procedure: Carotid Angiography;  Surgeon: Carlin FORBES Harvey, MD;  Location: [facility] INVASIVE CV LAB;  Service: Cardiovascular;  Laterality: Right;   S/P  Hysterectomy     TOTAL ABDOMINAL HYSTERECTOMY     FIBROID, HYPERMENORRHEA    Social History   Socioeconomic History   Marital status: Divorced    Spouse name: Not on file   Number of children: Not on file   Years of education: Not on file   Highest education level: Not on file  Occupational History   Not on file  Tobacco Use   Smoking status: Never   Smokeless tobacco: Never  Substance and Sexual Activity   Alcohol use: No    Alcohol/week: 0.0 standard drinks of alcohol   Drug use: No   Sexual activity: Not on file  Other Topics Concern   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, BELONGS TO [employer] [place] [employer])   Social Drivers of Health   Financial Resource Strain: Not on file  Food Insecurity: Not on file  Transportation Needs: Not on file  Physical Activity: Not on file  Stress: Not on file  Social Connections: Not on file  Intimate Partner Violence: Not on file    Family History  Problem Relation Age of Onset   Hypertension Mother    Heart attack Mother    Heart disease Mother    Hypertension Sister    Diabetes Sister        Amputation   Hyperlipidemia Sister    Heart attack Sister    Diabetes Sister    Hypertension Sister  Heart disease Sister        Before age 40   Heart disease Father    Colon cancer Neg Hx    Colon polyps Neg Hx     Current Outpatient Medications  Medication Sig Dispense Refill   acetaminophen  (TYLENOL ) 500 MG tablet Take 500-1,000 mg by mouth 2 (two) times daily as needed for mild pain.     amLODipine  (NORVASC ) 10 MG tablet TAKE 1 TABLET EVERY DAY 90 tablet 3   aspirin  81 MG tablet Take 81 mg by mouth daily.     atorvastatin  (LIPITOR ) 80 MG tablet TAKE 1 TABLET EVERY DAY 90 tablet 3   blood glucose meter kit and supplies Dispense based on patient and insurance preference. Use to test glucose once daily (FOR ICD-10 E11.9). 1 each 0   clopidogrel  (PLAVIX ) 75 MG tablet TAKE 1 TABLET (75 MG  TOTAL) BY MOUTH DAILY. 90 tablet 3   dorzolamide-timolol (COSOPT) 2-0.5 % ophthalmic solution Place 1 drop into both eyes 2 times daily.     doxazosin  (CARDURA ) 4 MG tablet TAKE 1 TABLET AT BEDTIME 90 tablet 3   empagliflozin  (JARDIANCE ) 10 MG TABS tablet TAKE 1 TABLET EVERY DAY BEFORE BREAKFAST 90 tablet 3   ezetimibe  (ZETIA ) 10 MG tablet Take 1 tablet (10 mg total) by mouth daily. 90 tablet 3   fexofenadine  (ALLEGRA ) 60 MG tablet Take 1 tablet (60 mg total) by mouth 2 (two) times daily. 30 tablet 1   fluticasone  (FLONASE ) 50 MCG/ACT nasal spray USE 2 SPRAYS IN EACH NOSTRIL ONE TIME DAILY 48 g 3   hydrochlorothiazide  (HYDRODIURIL ) 12.5 MG tablet Take 12.5 mg by mouth daily.     latanoprost (XALATAN) 0.005 % ophthalmic solution Place 1 drop into both eyes at bedtime.     metFORMIN  (GLUCOPHAGE ) 500 MG tablet TAKE 2 TABLETS TWICE DAILY 360 tablet 3   Multiple Vitamin (MULTIVITAMIN) tablet Take 1 tablet by mouth daily.     nitroGLYCERIN  (NITROSTAT ) 0.4 MG SL tablet DISSOLVE ONE TABLET UNDER THE TONGUE EVERY 5 MINUTES AS NEEDED FOR CHEST PAIN.  DO NOT EXCEED A TOTAL OF 3 DOSES IN 15 MINUTES 25 tablet 0   olopatadine  (PATANOL) 0.1 % ophthalmic solution Place 1 drop into both eyes 2 (two) times daily. 15 mL 4   ONETOUCH ULTRA test strip USE 1 STRIP TO CHECK GLUCOSE ONCE DAILY 100 each 0   OVER THE COUNTER MEDICATION Magnesium  400mg  one twice a day     pantoprazole  (PROTONIX ) 40 MG tablet TAKE 1 TABLET EVERY DAY AS NEEDED AS DIRECTED (DISCONTINUE FAMOTIDINE ) 90 tablet 3   VENTOLIN  HFA 108 (90 Base) MCG/ACT inhaler INHALE 2 PUFFS INTO THE LUNGS EVERY 4 (FOUR) HOURS AS NEEDED. 36 g 3   No current facility-administered medications for this visit.    Allergies  Allergen Reactions   Other Hives    IV DYE    Iodinated Contrast Media Hives    Pre-medicate with benadryl  and prednisone    Lisinopril      Angioedema of tongue July 2017     REVIEW OF SYSTEMS:  Negative unless noted in HPI [X]  denotes  positive finding, [ ]  denotes negative finding Cardiac  Comments:  Chest pain or chest pressure:    Shortness of breath upon exertion:    Short of breath when lying flat:    Irregular heart rhythm:        Vascular    Pain in calf, thigh, or hip brought on by ambulation:    Pain in  feet at night that wakes you up from your sleep:     Blood clot in your veins:    Leg swelling:         Pulmonary    Oxygen at home:    Productive cough:     Wheezing:         Neurologic    Sudden weakness in arms or legs:     Sudden numbness in arms or legs:     Sudden onset of difficulty speaking or slurred speech:    Temporary loss of vision in one eye:     Problems with dizziness:         Gastrointestinal    Blood in stool:     Vomited blood:         Genitourinary    Burning when urinating:     Blood in urine:        Psychiatric    Major depression:         Hematologic    Bleeding problems:    Problems with blood clotting too easily:        Skin    Rashes or ulcers:        Constitutional    Fever or chills:      PHYSICAL EXAMINATION:  Vitals:   07/30/24 1428 07/30/24 1430  BP: (!) 158/89 (!) 165/89  Pulse: 70   Temp: 98.3 F (36.8 C)   SpO2: 96%   Weight: 157 lb (71.2 kg)   Height: 5' 4 (1.626 m)     General:  WDWN in NAD; vital signs documented above Gait: Normal HENT: WNL, normocephalic Pulmonary: normal non-labored breathing Cardiac: regular HR Abdomen: soft Vascular Exam/Pulses: 2+ radial, 2+ DP pulses bilaterally Extremities: without ischemic changes, without Gangrene , without cellulitis; without open wounds;  Musculoskeletal: no muscle wasting or atrophy  Neurologic: A&O X 3 Psychiatric:  The pt has Normal affect.   Non-Invasive Vascular Imaging:   VAS US  Carotid Duplex: Summary:  Right Carotid: The ECA appears >50% stenosed. Patent stent with increased velocity > 75% stenosis. Very high bifurcation, limited visualization.   Left Carotid: Velocities  in the left ICA are consistent with a 40-59% stenosis. Calcific plaque may obscure higher velocity.   Vertebrals:  Bilateral vertebral arteries demonstrate antegrade flow.  Subclavians: Normal flow hemodynamics were seen in bilateral subclavian arteries.   ASSESSMENT/PLAN:: 78 y.o. female here for follow up for carotid artery stenosis. She has a history of right carotid endarterectomy by Dr. Eliza in 2004 for symptomatic stenosis. She subsequently required transfemoral right ICA stenting due to recurrent asymptomatic stenosis by Dr. Harvey in 2017. She is without any associated TIA or stroke like symptoms.  - Her duplex shows stable left ICA of 40-59% stenosis. Right has increased velocity within the proximal stent > 75%. Normal flow in the vertebral and subclavian arteries.  - Continue Aspirin , statin, Plavix  -The velocities in her proximal right ICA stent have continued to increase on serial examination. With velocity of 477 cm/s I have recommended we get a CTA of her neck to further evaluate this area - I will schedule her for CTA neck in near future and have her follow up with Dr. Pearline Teretha Damme, PA-C [facility] [phone]  On call MD:   [place]

## 2024-08-01 ENCOUNTER — Other Ambulatory Visit: Payer: Self-pay

## 2024-08-01 DIAGNOSIS — I6523 Occlusion and stenosis of bilateral carotid arteries: Secondary | ICD-10-CM

## 2024-08-01 DIAGNOSIS — Z91041 Radiographic dye allergy status: Secondary | ICD-10-CM

## 2024-08-01 MED ORDER — DIPHENHYDRAMINE HCL 50 MG PO CAPS: ORAL_CAPSULE | ORAL | 0 refills | Status: DC

## 2024-08-01 MED ORDER — PREDNISONE 50 MG PO TABS: ORAL_TABLET | ORAL | 0 refills | Status: DC

## 2024-08-02 DIAGNOSIS — H401123 Primary open-angle glaucoma, left eye, severe stage: Secondary | ICD-10-CM | POA: Diagnosis not present

## 2024-08-02 DIAGNOSIS — H401111 Primary open-angle glaucoma, right eye, mild stage: Secondary | ICD-10-CM | POA: Diagnosis not present

## 2024-08-06 ENCOUNTER — Other Ambulatory Visit: Payer: Self-pay | Admitting: Family Medicine

## 2024-08-21 ENCOUNTER — Ambulatory Visit: Admitting: General Surgery

## 2024-08-22 ENCOUNTER — Encounter: Payer: Self-pay | Admitting: General Surgery

## 2024-08-22 ENCOUNTER — Ambulatory Visit (INDEPENDENT_AMBULATORY_CARE_PROVIDER_SITE_OTHER): Admitting: General Surgery

## 2024-08-22 VITALS — BP 163/77 | HR 71 | Temp 98.2°F | Resp 14 | Ht 64.0 in | Wt 159.0 lb

## 2024-08-22 DIAGNOSIS — R011 Cardiac murmur, unspecified: Secondary | ICD-10-CM | POA: Insufficient documentation

## 2024-08-22 DIAGNOSIS — M6289 Other specified disorders of muscle: Secondary | ICD-10-CM | POA: Diagnosis not present

## 2024-08-22 DIAGNOSIS — R151 Fecal smearing: Secondary | ICD-10-CM | POA: Insufficient documentation

## 2024-08-22 NOTE — Patient Instructions (Addendum)
 You can try fiber added to your diet to see if that helps your stools have a better consistency to prevent the stool balls leaking out.  Physical therapy can help you with the muscle tone in the region and help you hold onto your stool. You have very weak resting tone of your anus.   Will see you back to see how you are doing. I highly recommend physical therapy and trying the exercises they recommend to see how this helps your symptoms.   I heard a possible murmur today. I have asked Dr. Alphonsa to look more into this. His office should be calling you.

## 2024-08-23 ENCOUNTER — Encounter: Payer: Self-pay | Admitting: General Surgery

## 2024-08-23 ENCOUNTER — Telehealth: Payer: Self-pay

## 2024-08-23 NOTE — Progress Notes (Signed)
 [facility] History and Physical  Reason for Referral: Fecal incontinence  Referring Physician: Alphonsa Glendia LABOR, MD    Chief Complaint   New Patient (Initial Visit)     Kara Kirby is a 78 y.o. female.  HPI:   Discussed the use of AI scribe software for clinical note transcription with the patient, who gave verbal consent to proceed.  History of Present Illness Kara Kirby is a 78 year old female who presents with fecal incontinence. She was referred by Dr. Juliet for evaluation of fecal incontinence and possible fistula.  She experiences fecal incontinence characterized by the unexpected passage of stool without prior sensation, describing it as 'something drops' into the toilet without her feeling it. This has been occurring for an unspecified duration, possibly years. She experiences occasional accidents in her underwear and sometimes on the floor after removing her clothes. The stool is described as small, solid balls rather than liquid or diarrhea, although she occasionally experiences smears when passing gas.  No pain or pressure in the anal region when sitting, but sometimes feels the need to have a bowel movement without being able to pass stool immediately. No use of manual pressure to aid bowel movements and no tissue protrusion from the anus. No blood in her stool or from her bottom and reports regular bowel movements, occurring every day, sometimes two to three times a day.  She recalls having episiotomies during childbirth, and suspects these may have contributed to her current symptoms. She had no issues of incontinence as a younger female.   She underwent a colonoscopy in 2019 with Dr. Harvey, which revealed diverticulosis but no polyps. No further colonoscopy was recommended given her age. She has a history of a carotid stent placement, takes Plavix   daily as prescribed by her vascular doctor, and denies any history of heart attacks or  heart stents. She has never used nitroglycerin  and reports no chest pain.    Past Medical History:  Diagnosis Date   Allergy    Arthritis    Left shoulder   Carotid artery occlusion    CVA (cerebral vascular accident) (HCC) 02-19-13   DM (diabetes mellitus) (HCC)    Dyslipidemia    GERD (gastroesophageal reflux disease)    intermittent Sx   Hematuria    Hip arthritis 2003   Left   HTN (hypertension)    Hyperlipidemia    Renal insufficiency, mild    Sleep related leg cramps     Past Surgical History:  Procedure Laterality Date   CAROTID ENDARTERECTOMY  RIGHT   COLONOSCOPY     COLONOSCOPY  10/19/2011   Procedure: COLONOSCOPY;  Surgeon: Margo CHRISTELLA Harvey, MD;  [facility]: [facility];  Service: Endoscopy;  Laterality: N/A;  10:15   COLONOSCOPY N/A 08/16/2018   Procedure: COLONOSCOPY;  Surgeon: Harvey Margo CROME, MD;  Location: [facility];  Service: Endoscopy;  Laterality: N/A;  9:30am   PERIPHERAL VASCULAR CATHETERIZATION Right 10/17/2015   Procedure: Carotid PTA/Stent Intervention;  Surgeon: Carlin FORBES Harvey, MD;  Location: [facility] INVASIVE CV LAB;  Service: Cardiovascular;  Laterality: Right;   PERIPHERAL VASCULAR CATHETERIZATION Right 10/17/2015   Procedure: Carotid Angiography;  Surgeon: Carlin FORBES Harvey, MD;  Location: [facility] INVASIVE CV LAB;  Service: Cardiovascular;  Laterality: Right;   S/P Hysterectomy     TOTAL ABDOMINAL HYSTERECTOMY     FIBROID, HYPERMENORRHEA    Family History  Problem Relation Age of Onset   Hypertension Mother    Heart attack Mother  Heart disease Mother    Hypertension Sister    Diabetes Sister        Amputation   Hyperlipidemia Sister    Heart attack Sister    Diabetes Sister    Hypertension Sister    Heart disease Sister        Before age 54   Heart disease Father    Colon cancer Neg Hx    Colon polyps Neg Hx     Social History   Tobacco Use   Smoking status: Never   Smokeless tobacco: Never  Substance Use Topics   Alcohol use: No     Alcohol/week: 0.0 standard drinks of alcohol   Drug use: No    Medications: I have reviewed the patient's current medications. Allergies as of 08/22/2024       Reactions   Other Hives   IV DYE   Iodinated Contrast Media Hives   Pre-medicate with benadryl  and prednisone    Lisinopril     Angioedema of tongue July 2017        Medication List        Accurate as of August 22, 2024 11:59 PM. If you have any questions, ask your nurse or doctor.          acetaminophen  500 MG tablet Commonly known as: TYLENOL  Take 500-1,000 mg by mouth 2 (two) times daily as needed for mild pain.   amLODipine  10 MG tablet Commonly known as: NORVASC  TAKE 1 TABLET EVERY DAY   aspirin  81 MG tablet Take 81 mg by mouth daily.   atorvastatin  80 MG tablet Commonly known as: LIPITOR  TAKE 1 TABLET EVERY DAY   blood glucose meter kit and supplies Dispense based on patient and insurance preference. Use to test glucose once daily (FOR ICD-10 E11.9).   clopidogrel  75 MG tablet Commonly known as: PLAVIX  TAKE 1 TABLET (75 MG TOTAL) BY MOUTH DAILY.   diphenhydrAMINE  50 MG capsule Commonly known as: BENADRYL  Take one capsule 1 hour prior to scan.   dorzolamide-timolol 2-0.5 % ophthalmic solution Commonly known as: COSOPT Place 1 drop into both eyes 2 times daily.   doxazosin  4 MG tablet Commonly known as: CARDURA  TAKE 1 TABLET AT BEDTIME   empagliflozin  10 MG Tabs tablet Commonly known as: Jardiance  TAKE 1 TABLET EVERY DAY BEFORE BREAKFAST   ezetimibe  10 MG tablet Commonly known as: Zetia  Take 1 tablet (10 mg total) by mouth daily.   fexofenadine  60 MG tablet Commonly known as: ALLEGRA  Take 1 tablet (60 mg total) by mouth 2 (two) times daily.   fluticasone  50 MCG/ACT nasal spray Commonly known as: FLONASE  USE 2 SPRAYS IN EACH NOSTRIL ONE TIME DAILY   hydrochlorothiazide  12.5 MG tablet Commonly known as: HYDRODIURIL  Take 12.5 mg by mouth daily.   latanoprost 0.005 % ophthalmic  solution Commonly known as: XALATAN Place 1 drop into both eyes at bedtime.   metFORMIN  500 MG tablet Commonly known as: GLUCOPHAGE  TAKE 2 TABLETS TWICE DAILY   multivitamin tablet Take 1 tablet by mouth daily.   nitroGLYCERIN  0.4 MG SL tablet Commonly known as: NITROSTAT  DISSOLVE ONE TABLET UNDER THE TONGUE EVERY 5 MINUTES AS NEEDED FOR CHEST PAIN.  DO NOT EXCEED A TOTAL OF 3 DOSES IN 15 MINUTES   olopatadine  0.1 % ophthalmic solution Commonly known as: PATANOL Place 1 drop into both eyes 2 (two) times daily.   OneTouch Ultra test strip Generic drug: glucose blood USE 1 STRIP TO CHECK GLUCOSE ONCE DAILY   OVER THE COUNTER MEDICATION Magnesium   400mg  one twice a day   pantoprazole  40 MG tablet Commonly known as: PROTONIX  TAKE 1 TABLET EVERY DAY AS NEEDED AS DIRECTED (DISCONTINUE FAMOTIDINE )   predniSONE  50 MG tablet Commonly known as: DELTASONE  Take one tablet 13 hours, 7 hours, and 1 hour prior to scan.   Ventolin  HFA 108 (90 Base) MCG/ACT inhaler Generic drug: albuterol  INHALE 2 PUFFS INTO THE LUNGS EVERY 4 (FOUR) HOURS AS NEEDED.         ROS:  A comprehensive review of systems was negative except for: Gastrointestinal: positive for some fecal incontinence Genitourinary: positive for frequency and urinary incontinence  Blood pressure (!) 163/77, pulse 71, temperature 98.2 F (36.8 C), temperature source Oral, resp. rate 14, height 5' 4 (1.626 m), weight 159 lb (72.1 kg), SpO2 96%. Chaperone present  Physical Exam GENERAL: Alert, cooperative, well developed, no acute distress HEENT: Normocephalic, normal oropharynx, moist mucous membranes CHEST: Clear to auscultation bilaterally, no wheezes, rhonchi, or crackles CARDIOVASCULAR: Normal heart rate and rhythm, possible systolic ejection murmur noted  ABDOMEN: Soft, non-tender, non-distended, without organomegaly, normal bowel sounds RECTAL: Anus examined, no abnormalities detected, poor tone resting and minor  squeeze, patulous rectal wall possible rectocele  EXTREMITIES: No cyanosis or edema NEUROLOGICAL: Cranial nerves grossly intact, moves all extremities without gross motor or sensory deficit  Results: None   Assessment & Plan Fecal incontinence/ Pelvic floor dysfunction  Chronic fecal incontinence with stool passage without sensation.  Previous colonoscopy in 2019 negative.  She has stool balls and sounds like she does not fully defecate. She may have some degree of a rectocele. Her resting tone is very poor and her squeeze is present but it is weak. I see no obvious fistulas or masses.   You can try fiber added to your diet to see if that helps your stools have a better consistency to prevent the stool balls leaking out.  Physical therapy can help you with the muscle tone in the region and help you hold onto your stool. You have very weak resting tone of your anus.   Will see you back to see how you are doing. I highly recommend physical therapy and trying the exercises they recommend to see how this helps your symptoms.   I see no indication for any surgery or surgical intervention at this time.   ? Systolic Ejection Murmur- not previously documented.  I heard a possible murmur today. I have asked Dr. Alphonsa to look more into this. His office should be calling you.    Will see you back in a few months to assess how the fiber is working and if you decided on Physical therapy.  Future Appointments  Date Time Provider Department Center  08/31/2024 10:40 AM Pearline Norman RAMAN, MD [facility]  09/20/2024 11:00 AM Kristine Ronal BIRCH, RD [facility] None  09/28/2024  3:40 PM [facility] VISIT [facility] [place]  10/23/2024 10:15 AM Kallie Manuelita BROCKS, MD [facility] None  12/12/2024 11:00 AM Alphonsa Glendia LABOR, MD [facility] [place] Manuelita BROCKS [place]ie 08/23/2024, 8:54 AM

## 2024-08-23 NOTE — Telephone Encounter (Signed)
 Attempt made to help schedule CT for upcoming appt.  LVM.

## 2024-08-28 ENCOUNTER — Other Ambulatory Visit: Payer: Self-pay

## 2024-08-28 ENCOUNTER — Ambulatory Visit (HOSPITAL_COMMUNITY)
Admission: RE | Admit: 2024-08-28 | Discharge: 2024-08-28 | Disposition: A | Discharge status: Home / Self Care | Source: Ambulatory Visit | Attending: Vascular Surgery | Admitting: Vascular Surgery

## 2024-08-28 DIAGNOSIS — I6523 Occlusion and stenosis of bilateral carotid arteries: Secondary | ICD-10-CM

## 2024-08-28 MED ORDER — DIPHENHYDRAMINE HCL 50 MG PO CAPS: ORAL_CAPSULE | ORAL | 0 refills | Status: DC

## 2024-08-28 MED ORDER — PREDNISONE 50 MG PO TABS: ORAL_TABLET | ORAL | 0 refills | Status: DC

## 2024-08-29 ENCOUNTER — Ambulatory Visit (HOSPITAL_COMMUNITY)
Admission: RE | Admit: 2024-08-29 | Discharge: 2024-08-29 | Disposition: A | Discharge status: Home / Self Care | Source: Ambulatory Visit | Attending: Vascular Surgery | Admitting: Vascular Surgery

## 2024-08-29 DIAGNOSIS — I708 Atherosclerosis of other arteries: Secondary | ICD-10-CM | POA: Diagnosis not present

## 2024-08-29 DIAGNOSIS — I672 Cerebral atherosclerosis: Secondary | ICD-10-CM | POA: Diagnosis not present

## 2024-08-29 DIAGNOSIS — I6523 Occlusion and stenosis of bilateral carotid arteries: Secondary | ICD-10-CM | POA: Diagnosis not present

## 2024-08-29 DIAGNOSIS — I6501 Occlusion and stenosis of right vertebral artery: Secondary | ICD-10-CM | POA: Diagnosis not present

## 2024-08-29 LAB — POCT I-STAT CREATININE: Creatinine, Ser: 1.3 mg/dL — ABNORMAL HIGH (ref 0.44–1.00)

## 2024-08-29 MED ORDER — IOHEXOL 350 MG/ML SOLN
75.0000 mL | Freq: Once | INTRAVENOUS | Status: AC | PRN
  Administered 2024-08-29: 75 mL via INTRAVENOUS

## 2024-08-30 ENCOUNTER — Encounter (HOSPITAL_COMMUNITY): Payer: Self-pay

## 2024-08-30 ENCOUNTER — Other Ambulatory Visit (HOSPITAL_COMMUNITY)

## 2024-08-30 NOTE — H&P (View-Only) (Signed)
 Patient ID: Kara Kirby, female   DOB: 24-Jan-1946, 78 y.o.   MRN: [id number]  Reason for Consult: Follow-up   Referred by Alphonsa Glendia LABOR, MD  Subjective:     HPI Kara Kirby is a 78 y.o. female presenting for follow-up of carotid stenosis.  She was seen about a month ago and was noted to have increased velocities in the right ICA stent corresponding to a greater than 75% stenosis.  Of note she underwent carotid endarterectomy with Dr. Melvenia in 2004 for symptomatic stenosis and subsequently required transfemoral right ICA stenting due to recurrent symptomatic stenosis by Dr. Harvey in 2017. At this time she continues to be asymptomatic and denies any neurologic symptoms.  Specifically she denies any one-sided weakness, numbness, amaurosis or speech issues.  She has not had any other neck surgeries or radiation.  Past Medical History:  Diagnosis Date   Allergy    Arthritis    Left shoulder   Carotid artery occlusion    CVA (cerebral vascular accident) (HCC) 02-19-13   DM (diabetes mellitus) (HCC)    Dyslipidemia    GERD (gastroesophageal reflux disease)    intermittent Sx   Hematuria    Hip arthritis 2003   Left   HTN (hypertension)    Hyperlipidemia    Renal insufficiency, mild    Sleep related leg cramps    Family History  Problem Relation Age of Onset   Hypertension Mother    Heart attack Mother    Heart disease Mother    Hypertension Sister    Diabetes Sister        Amputation   Hyperlipidemia Sister    Heart attack Sister    Diabetes Sister    Hypertension Sister    Heart disease Sister        Before age 12   Heart disease Father    Colon cancer Neg Hx    Colon polyps Neg Hx    Past Surgical History:  Procedure Laterality Date   CAROTID ENDARTERECTOMY  RIGHT   COLONOSCOPY     COLONOSCOPY  10/19/2011   Procedure: COLONOSCOPY;  Surgeon: Margo CHRISTELLA Harvey, MD;  [facility]: [facility];  Service: Endoscopy;  Laterality: N/A;  10:15   COLONOSCOPY  N/A 08/16/2018   Procedure: COLONOSCOPY;  Surgeon: Harvey Margo CROME, MD;  Location: [facility];  Service: Endoscopy;  Laterality: N/A;  9:30am   PERIPHERAL VASCULAR CATHETERIZATION Right 10/17/2015   Procedure: Carotid PTA/Stent Intervention;  Surgeon: Carlin FORBES Harvey, MD;  Location: [facility] INVASIVE CV LAB;  Service: Cardiovascular;  Laterality: Right;   PERIPHERAL VASCULAR CATHETERIZATION Right 10/17/2015   Procedure: Carotid Angiography;  Surgeon: Carlin FORBES Harvey, MD;  Location: [facility] INVASIVE CV LAB;  Service: Cardiovascular;  Laterality: Right;   S/P Hysterectomy     TOTAL ABDOMINAL HYSTERECTOMY     FIBROID, HYPERMENORRHEA    Short Social History:  Social History   Tobacco Use   Smoking status: Never   Smokeless tobacco: Never  Substance Use Topics   Alcohol use: No    Alcohol/week: 0.0 standard drinks of alcohol    Allergies  Allergen Reactions   Other Hives    IV DYE    Iodinated Contrast Media Hives    Pre-medicate with benadryl  and prednisone    Lisinopril      Angioedema of tongue July 2017    Current Outpatient Medications  Medication Sig Dispense Refill   acetaminophen  (TYLENOL ) 500 MG tablet Take 500-1,000 mg by mouth 2 (two) times  daily as needed for mild pain.     amLODipine  (NORVASC ) 10 MG tablet TAKE 1 TABLET EVERY DAY 90 tablet 3   aspirin  81 MG tablet Take 81 mg by mouth daily.     atorvastatin  (LIPITOR ) 80 MG tablet TAKE 1 TABLET EVERY DAY 90 tablet 3   blood glucose meter kit and supplies Dispense based on patient and insurance preference. Use to test glucose once daily (FOR ICD-10 E11.9). 1 each 0   clopidogrel  (PLAVIX ) 75 MG tablet TAKE 1 TABLET (75 MG TOTAL) BY MOUTH DAILY. 90 tablet 3   diphenhydrAMINE  (BENADRYL ) 50 MG capsule Take one capsule 1 hour prior to scan. 1 capsule 0   dorzolamide-timolol (COSOPT) 2-0.5 % ophthalmic solution Place 1 drop into both eyes 2 times daily.     doxazosin  (CARDURA ) 4 MG tablet TAKE 1 TABLET AT BEDTIME 90 tablet 3    empagliflozin  (JARDIANCE ) 10 MG TABS tablet TAKE 1 TABLET EVERY DAY BEFORE BREAKFAST 90 tablet 3   ezetimibe  (ZETIA ) 10 MG tablet Take 1 tablet (10 mg total) by mouth daily. 90 tablet 3   fexofenadine  (ALLEGRA ) 60 MG tablet Take 1 tablet (60 mg total) by mouth 2 (two) times daily. 30 tablet 1   fluticasone  (FLONASE ) 50 MCG/ACT nasal spray USE 2 SPRAYS IN EACH NOSTRIL ONE TIME DAILY 48 g 3   hydrochlorothiazide  (HYDRODIURIL ) 12.5 MG tablet Take 12.5 mg by mouth daily.     latanoprost (XALATAN) 0.005 % ophthalmic solution Place 1 drop into both eyes at bedtime.     metFORMIN  (GLUCOPHAGE ) 500 MG tablet TAKE 2 TABLETS TWICE DAILY 360 tablet 3   Multiple Vitamin (MULTIVITAMIN) tablet Take 1 tablet by mouth daily.     nitroGLYCERIN  (NITROSTAT ) 0.4 MG SL tablet DISSOLVE ONE TABLET UNDER THE TONGUE EVERY 5 MINUTES AS NEEDED FOR CHEST PAIN.  DO NOT EXCEED A TOTAL OF 3 DOSES IN 15 MINUTES 25 tablet 0   olopatadine  (PATANOL) 0.1 % ophthalmic solution Place 1 drop into both eyes 2 (two) times daily. 15 mL 4   ONETOUCH ULTRA test strip USE 1 STRIP TO CHECK GLUCOSE ONCE DAILY 100 each 0   OVER THE COUNTER MEDICATION Magnesium  400mg  one twice a day     pantoprazole  (PROTONIX ) 40 MG tablet TAKE 1 TABLET EVERY DAY AS NEEDED AS DIRECTED (DISCONTINUE FAMOTIDINE ) 90 tablet 3   predniSONE  (DELTASONE ) 50 MG tablet Take one tablet 13 hours, 7 hours, and 1 hour prior to scan. 3 tablet 0   VENTOLIN  HFA 108 (90 Base) MCG/ACT inhaler INHALE 2 PUFFS INTO THE LUNGS EVERY 4 (FOUR) HOURS AS NEEDED. 36 g 3   No current facility-administered medications for this visit.    REVIEW OF SYSTEMS  All other systems were reviewed and are negative     Objective:  Objective   Vitals:   08/31/24 1024 08/31/24 1027  BP: (!) 143/80 (!) 159/83  Pulse: 78   Resp: 18   Temp: (!) 97.4 F (36.3 C)   TempSrc: Temporal   SpO2: 97%   Weight: 156 lb (70.8 kg)   Height: 5' 4 (1.626 m)    Body mass index is 26.78 kg/m.  Physical  Exam General: no acute distress Cardiac: hemodynamically stable Extremities: no edema, cyanosis or wounds Vascular:   Right: Palpable radial  Left: Palpable radial  Data: CTA Severe stenosis at the proximal stent edge of the previous placed right carotid stent.     Assessment/Plan:   Kara Kirby is a 78 y.o. female  with carotid artery stenosis.  She underwent carotid endarterectomy on the right in 2004 and subsequently transfemoral stenting for restenosis in 2017.  She now has a severe stenosis at the proximal stent edge in the common carotid artery of the previous placed stent.  I explained the 3 modalities of treatment with best medical therapy, carotid endarterectomy and carotid stenting. We discussed the risks and benefits of carotid intrevention.  I informed that there is a small risk of stroke during the procedure which is about 1 to 2% but with medical therapy alone there is a 12% risk of ipsilateral stroke over the next 2 years.  I explained that with intervention, that risk is significantly reduced.  We also discussed the risk of cranial nerve injury, bleeding, infection and the small risk of restenosis in the future. I offered R TCAR and she elected to proceed.  She will continue aspirin  and Plavix  throughout the perioperative period.    Norman GORMAN Serve MD [facility]

## 2024-08-30 NOTE — Progress Notes (Signed)
 Patient ID: Kara Kirby, female   DOB: 24-Jan-1946, 78 y.o.   MRN: [id number]  Reason for Consult: Follow-up   Referred by Alphonsa Glendia LABOR, MD  Subjective:     HPI Kara Kirby is a 78 y.o. female presenting for follow-up of carotid stenosis.  She was seen about a month ago and was noted to have increased velocities in the right ICA stent corresponding to a greater than 75% stenosis.  Of note she underwent carotid endarterectomy with Dr. Melvenia in 2004 for symptomatic stenosis and subsequently required transfemoral right ICA stenting due to recurrent symptomatic stenosis by Dr. Harvey in 2017. At this time she continues to be asymptomatic and denies any neurologic symptoms.  Specifically she denies any one-sided weakness, numbness, amaurosis or speech issues.  She has not had any other neck surgeries or radiation.  Past Medical History:  Diagnosis Date   Allergy    Arthritis    Left shoulder   Carotid artery occlusion    CVA (cerebral vascular accident) (HCC) 02-19-13   DM (diabetes mellitus) (HCC)    Dyslipidemia    GERD (gastroesophageal reflux disease)    intermittent Sx   Hematuria    Hip arthritis 2003   Left   HTN (hypertension)    Hyperlipidemia    Renal insufficiency, mild    Sleep related leg cramps    Family History  Problem Relation Age of Onset   Hypertension Mother    Heart attack Mother    Heart disease Mother    Hypertension Sister    Diabetes Sister        Amputation   Hyperlipidemia Sister    Heart attack Sister    Diabetes Sister    Hypertension Sister    Heart disease Sister        Before age 12   Heart disease Father    Colon cancer Neg Hx    Colon polyps Neg Hx    Past Surgical History:  Procedure Laterality Date   CAROTID ENDARTERECTOMY  RIGHT   COLONOSCOPY     COLONOSCOPY  10/19/2011   Procedure: COLONOSCOPY;  Surgeon: Margo CHRISTELLA Harvey, MD;  Location: [facility];  Service: Endoscopy;  Laterality: N/A;  10:15   COLONOSCOPY  N/A 08/16/2018   Procedure: COLONOSCOPY;  Surgeon: Harvey Margo CROME, MD;  Location: [facility];  Service: Endoscopy;  Laterality: N/A;  9:30am   PERIPHERAL VASCULAR CATHETERIZATION Right 10/17/2015   Procedure: Carotid PTA/Stent Intervention;  Surgeon: Carlin FORBES Harvey, MD;  Location: [facility] INVASIVE CV LAB;  Service: Cardiovascular;  Laterality: Right;   PERIPHERAL VASCULAR CATHETERIZATION Right 10/17/2015   Procedure: Carotid Angiography;  Surgeon: Carlin FORBES Harvey, MD;  Location: [facility] INVASIVE CV LAB;  Service: Cardiovascular;  Laterality: Right;   S/P Hysterectomy     TOTAL ABDOMINAL HYSTERECTOMY     FIBROID, HYPERMENORRHEA    Short Social History:  Social History   Tobacco Use   Smoking status: Never   Smokeless tobacco: Never  Substance Use Topics   Alcohol use: No    Alcohol/week: 0.0 standard drinks of alcohol    Allergies  Allergen Reactions   Other Hives    IV DYE    Iodinated Contrast Media Hives    Pre-medicate with benadryl  and prednisone    Lisinopril      Angioedema of tongue July 2017    Current Outpatient Medications  Medication Sig Dispense Refill   acetaminophen  (TYLENOL ) 500 MG tablet Take 500-1,000 mg by mouth 2 (two) times  daily as needed for mild pain.     amLODipine  (NORVASC ) 10 MG tablet TAKE 1 TABLET EVERY DAY 90 tablet 3   aspirin  81 MG tablet Take 81 mg by mouth daily.     atorvastatin  (LIPITOR ) 80 MG tablet TAKE 1 TABLET EVERY DAY 90 tablet 3   blood glucose meter kit and supplies Dispense based on patient and insurance preference. Use to test glucose once daily (FOR ICD-10 E11.9). 1 each 0   clopidogrel  (PLAVIX ) 75 MG tablet TAKE 1 TABLET (75 MG TOTAL) BY MOUTH DAILY. 90 tablet 3   diphenhydrAMINE  (BENADRYL ) 50 MG capsule Take one capsule 1 hour prior to scan. 1 capsule 0   dorzolamide-timolol (COSOPT) 2-0.5 % ophthalmic solution Place 1 drop into both eyes 2 times daily.     doxazosin  (CARDURA ) 4 MG tablet TAKE 1 TABLET AT BEDTIME 90 tablet 3    empagliflozin  (JARDIANCE ) 10 MG TABS tablet TAKE 1 TABLET EVERY DAY BEFORE BREAKFAST 90 tablet 3   ezetimibe  (ZETIA ) 10 MG tablet Take 1 tablet (10 mg total) by mouth daily. 90 tablet 3   fexofenadine  (ALLEGRA ) 60 MG tablet Take 1 tablet (60 mg total) by mouth 2 (two) times daily. 30 tablet 1   fluticasone  (FLONASE ) 50 MCG/ACT nasal spray USE 2 SPRAYS IN EACH NOSTRIL ONE TIME DAILY 48 g 3   hydrochlorothiazide  (HYDRODIURIL ) 12.5 MG tablet Take 12.5 mg by mouth daily.     latanoprost (XALATAN) 0.005 % ophthalmic solution Place 1 drop into both eyes at bedtime.     metFORMIN  (GLUCOPHAGE ) 500 MG tablet TAKE 2 TABLETS TWICE DAILY 360 tablet 3   Multiple Vitamin (MULTIVITAMIN) tablet Take 1 tablet by mouth daily.     nitroGLYCERIN  (NITROSTAT ) 0.4 MG SL tablet DISSOLVE ONE TABLET UNDER THE TONGUE EVERY 5 MINUTES AS NEEDED FOR CHEST PAIN.  DO NOT EXCEED A TOTAL OF 3 DOSES IN 15 MINUTES 25 tablet 0   olopatadine  (PATANOL) 0.1 % ophthalmic solution Place 1 drop into both eyes 2 (two) times daily. 15 mL 4   ONETOUCH ULTRA test strip USE 1 STRIP TO CHECK GLUCOSE ONCE DAILY 100 each 0   OVER THE COUNTER MEDICATION Magnesium  400mg  one twice a day     pantoprazole  (PROTONIX ) 40 MG tablet TAKE 1 TABLET EVERY DAY AS NEEDED AS DIRECTED (DISCONTINUE FAMOTIDINE ) 90 tablet 3   predniSONE  (DELTASONE ) 50 MG tablet Take one tablet 13 hours, 7 hours, and 1 hour prior to scan. 3 tablet 0   VENTOLIN  HFA 108 (90 Base) MCG/ACT inhaler INHALE 2 PUFFS INTO THE LUNGS EVERY 4 (FOUR) HOURS AS NEEDED. 36 g 3   No current facility-administered medications for this visit.    REVIEW OF SYSTEMS  All other systems were reviewed and are negative     Objective:  Objective   Vitals:   08/31/24 1024 08/31/24 1027  BP: (!) 143/80 (!) 159/83  Pulse: 78   Resp: 18   Temp: (!) 97.4 F (36.3 C)   TempSrc: Temporal   SpO2: 97%   Weight: 156 lb (70.8 kg)   Height: 5' 4 (1.626 m)    Body mass index is 26.78 kg/m.  Physical  Exam General: no acute distress Cardiac: hemodynamically stable Extremities: no edema, cyanosis or wounds Vascular:   Right: Palpable radial  Left: Palpable radial  Data: CTA Severe stenosis at the proximal stent edge of the previous placed right carotid stent.     Assessment/Plan:   Kara Kirby is a 78 y.o. female  with carotid artery stenosis.  She underwent carotid endarterectomy on the right in 2004 and subsequently transfemoral stenting for restenosis in 2017.  She now has a severe stenosis at the proximal stent edge in the common carotid artery of the previous placed stent.  I explained the 3 modalities of treatment with best medical therapy, carotid endarterectomy and carotid stenting. We discussed the risks and benefits of carotid intrevention.  I informed that there is a small risk of stroke during the procedure which is about 1 to 2% but with medical therapy alone there is a 12% risk of ipsilateral stroke over the next 2 years.  I explained that with intervention, that risk is significantly reduced.  We also discussed the risk of cranial nerve injury, bleeding, infection and the small risk of restenosis in the future. I offered R TCAR and she elected to proceed.  She will continue aspirin  and Plavix  throughout the perioperative period.    Norman GORMAN Serve MD [facility]

## 2024-08-31 ENCOUNTER — Ambulatory Visit: Attending: Vascular Surgery | Admitting: Vascular Surgery

## 2024-08-31 ENCOUNTER — Other Ambulatory Visit: Payer: Self-pay

## 2024-08-31 ENCOUNTER — Encounter: Payer: Self-pay | Admitting: Vascular Surgery

## 2024-08-31 VITALS — BP 159/83 | HR 78 | Temp 97.4°F | Resp 18 | Ht 64.0 in | Wt 156.0 lb

## 2024-08-31 DIAGNOSIS — I6521 Occlusion and stenosis of right carotid artery: Secondary | ICD-10-CM

## 2024-08-31 DIAGNOSIS — I6523 Occlusion and stenosis of bilateral carotid arteries: Secondary | ICD-10-CM

## 2024-09-03 ENCOUNTER — Encounter (HOSPITAL_COMMUNITY): Payer: Self-pay | Admitting: Vascular Surgery

## 2024-09-05 NOTE — Progress Notes (Addendum)
 Surgical Instructions   Your procedure is scheduled on Thursday December 4.. Report to [facility] Main Entrance A at 5:30 A.M., then check in with the Admitting office. Any questions or running late day of surgery: call [phone]  Questions prior to your surgery date: call [phone], Monday-Friday, 8am-4pm. If you experience any cold or flu symptoms such as cough, fever, chills, shortness of breath, etc. between now and your scheduled surgery, please notify us  at the above number.     Remember:  Do not eat or drink anything after midnight the night before your surgery    Take these medicines the morning of surgery with A SIP OF WATER   amLODipine  (NORVASC )  atorvastatin  (LIPITOR )  aspirin   brimonidine (ALPHAGAN)  clopidogrel  (PLAVIX )  dorzolamide-timolol (COSOPT)  ezetimibe  (ZETIA )  fexofenadine  (ALLEGRA )    May take these medicines IF NEEDED: acetaminophen  (TYLENOL )  nitroGLYCERIN  (NITROSTAT ) - if you have to take this medicine prior to surgery, please call [phone]. pantoprazole  (PROTONIX )  VENTOLIN  HFA 108 (90 Base) MCG/ACT inhaler - please bring inhaler to the hospital   One week prior to surgery, STOP taking any Aspirin  (unless otherwise instructed by your surgeon) Aleve, Naproxen, Ibuprofen, Motrin, Advil, Goody's, BC's, all herbal medications, fish oil, and non-prescription vitamins.          WHAT DO I DO ABOUT MY DIABETES MEDICATION  YOU WILL NEED TO HOLD YOUR  JARDIANCE  FOR 3 DAYS BEFORE YOUR PROCEDURE. TAKE YOUR LAST DOSE ON 11/29.   Do not take oral diabetes medicines (pills) the morning of surgery. The morning of surgery DO NOT take metFORMIN  (GLUCOPHAGE ) .  HOW TO MANAGE YOUR DIABETES BEFORE AND AFTER SURGERY  Why is it important to control my blood sugar before and after surgery? Improving blood sugar levels before and after surgery helps healing and can limit problems. A way of improving blood sugar control is eating a healthy diet by:  Eating  less sugar and carbohydrates  Increasing activity/exercise  Talking with your doctor about reaching your blood sugar goals High blood sugars (greater than 180 mg/dL) can raise your risk of infections and slow your recovery, so you will need to focus on controlling your diabetes during the weeks before surgery. Make sure that the doctor who takes care of your diabetes knows about your planned surgery including the date and location.  How do I manage my blood sugar before surgery? Check your blood sugar at least 4 times a day, starting 2 days before surgery, to make sure that the level is not too high or low.  Check your blood sugar the morning of your surgery when you wake up and every 2 hours until you get to the Short Stay unit.  If your blood sugar is less than 70 mg/dL, you will need to treat for low blood sugar: Do not take insulin . Treat a low blood sugar (less than 70 mg/dL) with  cup of clear juice (cranberry or apple), 4 glucose tablets, OR glucose gel. Recheck blood sugar in 15 minutes after treatment (to make sure it is greater than 70 mg/dL). If your blood sugar is not greater than 70 mg/dL on recheck, call [phone] for further instructions. Report your blood sugar to the short stay nurse when you get to Short Stay.  If you are admitted to the hospital after surgery: Your blood sugar will be checked by the staff and you will probably be given insulin  after surgery (instead of oral diabetes medicines) to make sure you have good blood sugar  levels. The goal for blood sugar control after surgery is 80-180 mg/dL.               Do NOT Smoke (Tobacco/Vaping) for 24 hours prior to your procedure.  If you use a CPAP at night, you may bring your mask/headgear for your overnight stay.   You will be asked to remove any contacts, glasses, piercing's, hearing aid's, dentures/partials prior to surgery. Please bring cases for these items if needed.    Patients discharged the day of  surgery will not be allowed to drive home, and someone needs to stay with them for 24 hours.  SURGICAL WAITING ROOM VISITATION Patients may have no more than 2 support people in the waiting area - these visitors may rotate.   Pre-op nurse will coordinate an appropriate time for 1 ADULT support person, who may not rotate, to accompany patient in pre-op.  Children under the age of 22 must have an adult with them who is not the patient and must remain in the main waiting area with an adult.  If the patient needs to stay at the hospital during part of their recovery, the visitor guidelines for inpatient rooms apply.  Please refer to the [facility] website for the visitor guidelines for any additional information.   If you received a COVID test during your pre-op visit  it is requested that you wear a mask when out in public, stay away from anyone that may not be feeling well and notify your surgeon if you develop symptoms. If you have been in contact with anyone that has tested positive in the last 10 days please notify you surgeon.      Pre-operative CHG Bathing Instructions   You can play a key role in reducing the risk of infection after surgery. Your skin needs to be as free of germs as possible. You can reduce the number of germs on your skin by washing with CHG (chlorhexidine gluconate) soap before surgery. CHG is an antiseptic soap that kills germs and continues to kill germs even after washing.   DO NOT use if you have an allergy to chlorhexidine/CHG or antibacterial soaps. If your skin becomes reddened or irritated, stop using the CHG and notify one of our RNs at [phone].              TAKE A SHOWER THE NIGHT BEFORE SURGERY   Please keep in mind the following:  DO NOT shave, including legs and underarms, 48 hours prior to surgery.   You may shave your face before/day of surgery.  Place clean sheets on your bed the night before surgery Use a clean washcloth (not used since being  washed) for shower. DO NOT sleep with pet's night before surgery.  CHG Shower Instructions:  Wash your face and private area with normal soap. If you choose to wash your hair, wash first with your normal shampoo.  After you use shampoo/soap, rinse your hair and body thoroughly to remove shampoo/soap residue.  Turn the water  OFF and apply half the bottle of CHG soap to a CLEAN washcloth.  Apply CHG soap ONLY FROM YOUR NECK DOWN TO YOUR TOES (washing for 3-5 minutes)  DO NOT use CHG soap on face, private areas, open wounds, or sores.  Pay special attention to the area where your surgery is being performed.  If you are having back surgery, having someone wash your back for you may be helpful. Wait 2 minutes after CHG soap is applied, then you  may rinse off the CHG soap.  Pat dry with a clean towel  Put on clean pajamas    Additional instructions for the day of surgery: If you choose, you may shower the morning of surgery with an antibacterial soap.  DO NOT APPLY any lotions, deodorants, cologne, or perfumes.   Do not wear jewelry or makeup Do not wear nail polish, gel polish, artificial nails, or any other type of covering on natural nails (fingers and toes) Do not bring valuables to the hospital. [facility] is not responsible for valuables/personal belongings. Put on clean/comfortable clothes.  Please brush your teeth.  Ask your nurse before applying any prescription medications to the skin.

## 2024-09-07 ENCOUNTER — Other Ambulatory Visit: Payer: Self-pay

## 2024-09-07 ENCOUNTER — Encounter (HOSPITAL_COMMUNITY): Payer: Self-pay

## 2024-09-07 ENCOUNTER — Encounter (HOSPITAL_COMMUNITY)
Admission: RE | Admit: 2024-09-07 | Discharge: 2024-09-07 | Disposition: A | Discharge status: Home / Self Care | Source: Ambulatory Visit | Attending: Vascular Surgery | Admitting: Vascular Surgery

## 2024-09-07 VITALS — BP 174/73 | HR 88 | Temp 98.3°F | Resp 17 | Ht 64.0 in | Wt 159.7 lb

## 2024-09-07 DIAGNOSIS — I2510 Atherosclerotic heart disease of native coronary artery without angina pectoris: Secondary | ICD-10-CM | POA: Insufficient documentation

## 2024-09-07 DIAGNOSIS — Z9049 Acquired absence of other specified parts of digestive tract: Secondary | ICD-10-CM | POA: Diagnosis not present

## 2024-09-07 DIAGNOSIS — Z79899 Other long term (current) drug therapy: Secondary | ICD-10-CM | POA: Diagnosis not present

## 2024-09-07 DIAGNOSIS — K219 Gastro-esophageal reflux disease without esophagitis: Secondary | ICD-10-CM | POA: Insufficient documentation

## 2024-09-07 DIAGNOSIS — I6523 Occlusion and stenosis of bilateral carotid arteries: Secondary | ICD-10-CM | POA: Diagnosis not present

## 2024-09-07 DIAGNOSIS — Z7984 Long term (current) use of oral hypoglycemic drugs: Secondary | ICD-10-CM | POA: Insufficient documentation

## 2024-09-07 DIAGNOSIS — M1990 Unspecified osteoarthritis, unspecified site: Secondary | ICD-10-CM | POA: Insufficient documentation

## 2024-09-07 DIAGNOSIS — I129 Hypertensive chronic kidney disease with stage 1 through stage 4 chronic kidney disease, or unspecified chronic kidney disease: Secondary | ICD-10-CM | POA: Insufficient documentation

## 2024-09-07 DIAGNOSIS — Z7982 Long term (current) use of aspirin: Secondary | ICD-10-CM | POA: Diagnosis not present

## 2024-09-07 DIAGNOSIS — E785 Hyperlipidemia, unspecified: Secondary | ICD-10-CM | POA: Diagnosis not present

## 2024-09-07 DIAGNOSIS — E1122 Type 2 diabetes mellitus with diabetic chronic kidney disease: Secondary | ICD-10-CM | POA: Insufficient documentation

## 2024-09-07 DIAGNOSIS — N1831 Chronic kidney disease, stage 3a: Secondary | ICD-10-CM | POA: Insufficient documentation

## 2024-09-07 DIAGNOSIS — Z9689 Presence of other specified functional implants: Secondary | ICD-10-CM | POA: Diagnosis not present

## 2024-09-07 DIAGNOSIS — Z01818 Encounter for other preprocedural examination: Secondary | ICD-10-CM | POA: Insufficient documentation

## 2024-09-07 DIAGNOSIS — Z8673 Personal history of transient ischemic attack (TIA), and cerebral infarction without residual deficits: Secondary | ICD-10-CM | POA: Diagnosis not present

## 2024-09-07 DIAGNOSIS — I252 Old myocardial infarction: Secondary | ICD-10-CM | POA: Insufficient documentation

## 2024-09-07 DIAGNOSIS — Z7902 Long term (current) use of antithrombotics/antiplatelets: Secondary | ICD-10-CM | POA: Insufficient documentation

## 2024-09-07 LAB — COMPREHENSIVE METABOLIC PANEL WITH GFR
ALT: 22 U/L (ref 0–44)
AST: 19 U/L (ref 15–41)
Albumin: 3.6 g/dL (ref 3.5–5.0)
Alkaline Phosphatase: 46 U/L (ref 38–126)
Anion gap: 11 (ref 5–15)
BUN: 15 mg/dL (ref 8–23)
CO2: 27 mmol/L (ref 22–32)
Calcium: 9.5 mg/dL (ref 8.9–10.3)
Chloride: 103 mmol/L (ref 98–111)
Creatinine, Ser: 1.13 mg/dL — ABNORMAL HIGH (ref 0.44–1.00)
GFR, Estimated: 49 mL/min — ABNORMAL LOW (ref >60)
Glucose, Bld: 244 mg/dL — ABNORMAL HIGH (ref 70–99)
Potassium: 3.6 mmol/L (ref 3.5–5.1)
Sodium: 141 mmol/L (ref 135–145)
Total Bilirubin: 0.7 mg/dL (ref 0.0–1.2)
Total Protein: 6.5 g/dL (ref 6.5–8.1)

## 2024-09-07 LAB — CBC
HCT: 45.5 % (ref 36.0–46.0)
Hemoglobin: 14.6 g/dL (ref 12.0–15.0)
MCH: 30.2 pg (ref 26.0–34.0)
MCHC: 32.1 g/dL (ref 30.0–36.0)
MCV: 94 fL (ref 80.0–100.0)
Platelets: 256 K/uL (ref 150–400)
RBC: 4.84 MIL/uL (ref 3.87–5.11)
RDW: 13.4 % (ref 11.5–15.5)
WBC: 6.8 K/uL (ref 4.0–10.5)
nRBC: 0 % (ref 0.0–0.2)

## 2024-09-07 LAB — URINALYSIS, ROUTINE W REFLEX MICROSCOPIC
Bacteria, UA: NONE SEEN
Bilirubin Urine: NEGATIVE
Glucose, UA: >=500 mg/dL — AB
Ketones, ur: NEGATIVE mg/dL
Leukocytes,Ua: NEGATIVE
Nitrite: NEGATIVE
Protein, ur: 30 mg/dL — AB
Specific Gravity, Urine: 1.022 (ref 1.005–1.030)
pH: 5 (ref 5.0–8.0)

## 2024-09-07 LAB — SURGICAL PCR SCREEN
MRSA, PCR: NEGATIVE
Staphylococcus aureus: NEGATIVE

## 2024-09-07 LAB — PROTIME-INR
INR: 0.9 (ref 0.8–1.2)
Prothrombin Time: 13 s (ref 11.4–15.2)

## 2024-09-07 LAB — APTT: aPTT: 25 s (ref 24–36)

## 2024-09-07 LAB — GLUCOSE, CAPILLARY: Glucose-Capillary: 199 mg/dL — ABNORMAL HIGH (ref 70–99)

## 2024-09-07 NOTE — Progress Notes (Addendum)
 PCP - Glendia Alphonsa COME Cardiologist - denies  PPM/ICD - denies Device Orders -  Rep Notified -   Chest x-ray - na EKG - 09/07/24 Stress Test - 07/28/05 ECHO - 02/19/13 Cardiac Cath - denies  Sleep Study - denies CPAP - no  Fasting Blood Sugar - 100-140 Checks Blood Sugar 2-3 times a week  Last dose of GLP1 agonist-  na GLP1 instructions:   Blood Thinner Instructions: per Dr. Pearline; continue Plavix  Aspirin  Instructions:per Dr. Pearline, continue aspirin .   ERAS Protcol -NPO PRE-SURGERY Ensure or G2-   COVID TEST- na   Anesthesia review: yes- carotid artery stenosis,right carotid endarterectomy 2004, transfemoral right ICA stenting 2017, DM, HTN  Patient denies shortness of breath, fever, cough and chest pain at PAT appointment   All instructions explained to the patient, with a verbal understanding of the material. Patient agrees to go over the instructions while at home for a better understanding.. The opportunity to ask questions was provided.

## 2024-09-10 ENCOUNTER — Telehealth: Payer: Self-pay

## 2024-09-10 DIAGNOSIS — I2510 Atherosclerotic heart disease of native coronary artery without angina pectoris: Secondary | ICD-10-CM

## 2024-09-10 DIAGNOSIS — Z01818 Encounter for other preprocedural examination: Secondary | ICD-10-CM

## 2024-09-10 DIAGNOSIS — R0609 Other forms of dyspnea: Secondary | ICD-10-CM

## 2024-09-10 NOTE — Progress Notes (Addendum)
 Anesthesia Chart Review:  Case: [id number] Date/Time: 09/13/24 0715   Procedure: TRANSCAROTID ARTERY REVASCULARIZATION (TCAR) (Right)   Anesthesia type: General   Diagnosis: Carotid stenosis, bilateral [I65.23]   Pre-op diagnosis: Carotid stenosis BL   Location: [facility] ROOM 16 / [facility]   Surgeons: Pearline Norman RAMAN, MD       DISCUSSION: Patient is a 78 year old female scheduled for the above procedure.   History includes never smoker, HTN, DM2, dyslipidemia, CVA (02/19/2013), carotid artery stenosis (right carotid endarterectomy 03/15/2003, right carotid stenting for recurrent stenosis 10/17/2015), CKD (stage 3a), GERD, osteoarthritis, cholecystectomy (8/13/20007).   She had cardiology evaluation nearly 20 years ago in 2006. 07/21/2005 LHC showed CTO proximal RCA with bridging collaterals, 30% PDA (small), normal LM, 60-70% mid LAD with distal luminal irregularities, 25% AV groove CX, 30% OM1, luminal irregularities OM2 (small), 25% OM3, LVEF 65% with mild inferobasilar hypokinesis. She subsequently had a nuclear stress test on 07/28/2005 to assess LAD significance and test showed inferolateral scar consistent with CTO RCA, overall EF preserved, no active ischemia, low risk study. She had a TTE in 02/2013 at that time of her CVA that showed LVEF 55-60%, no regional wall motion abnormalities, severe concentric LVH, grade 1 DD, mild TR. Long term monitor in 2014 showed SR/ST, sinus arrhyhtmia, PVCs.   She has been followed by primary care physician Dr. Glendia Fielding since at least 07/2005. Last visit noted was from 07/13/2024 for follow-up chronic medical conditions including DM, HTN, HLD. Meds include Lipitor , Zetia , Jardiance , doxazosin , hydrochlorothiazide , olmesartan, amlodipine , ASA, Plavix . Meds continued. Continue follow-up with nephrology for CKD/proteinuria recommended. No recent asthma flares. She was referred to general surgery for encopresis since traumatic delivery when she was 78 years old, but more  recently with worsening. 4 month follow-up planned.   A1c 7.0% on 07/02/2024. On Jardiance , metformin . Last Jardiance  planned for 09/08/2024.   She is to continue ASA and Plavix .   As above, remote diagnosis of CAD. She is not followed by cardiology. As of October she was described as being fairly active and able to mow her yard without difficultly. I left her a voice message to call, so I can confirm activity tolerance. Her EKG appears overall appeared stable, with slower rate at 72 bpm when compared to tracing from 2017. In the interim, I discussed available information with anesthesiologist Maryclare Cornet, MD. If reasonable activity tolerance and asymptomatic from a CV standpoint then would likely defer additional preoperative recommendations to her PCP Dr. Fielding. I have sent communication to both Dr. Fielding and Dr. Pearline. Chart will be left for follow-up.   ADDENDUM 09/10/2024 4:01 PM: Ms. Backs called me back. She denied chest pain, SOB, edema, palpitations. She vaguely remembers getting a cardiac cath. She thinks she may have had one follow-up visit with cardiology, but has otherwise been followed by Dr. Fielding. She does do her own mowing using a push mower in the front and half of her back yard and typically a riding mower for the back half of her lawn, although recently the riding mower is broken. She could not recall how big her property was, but is < 1/2 acre. She could not tell me how long she mows, but that she takes one break after push mowing the front and 1/2 back of her back yard before she completes the back yard. She also tends to park far away from the [place] entrance, so she can get exercise walking. She does not have CV symptoms with these  activities. She reported that she send communication to Dr. Alphonsa this morning about her surgery. I informed her that I had reached out to Dr. Alphonsa for additional preoperative input.    VS: BP (!) 174/73   Pulse 88   Temp 36.8 C   Resp  17   Ht 5' 4 (1.626 m)   Wt 72.4 kg   SpO2 97%   BMI 27.41 kg/m    PROVIDERS: Alphonsa Glendia LABOR, MD is PCP  Gaynell Clos, MD is nephrologist   LABS: Labs reviewed: Acceptable for surgery. (all labs ordered are listed, but only abnormal results are displayed)  Labs Reviewed  GLUCOSE, CAPILLARY - Abnormal; Notable for the following components:      Result Value   Glucose-Capillary 199 (*)    All other components within normal limits  COMPREHENSIVE METABOLIC PANEL WITH GFR - Abnormal; Notable for the following components:   Glucose, Bld 244 (*)    Creatinine, Ser 1.13 (*)    GFR, Estimated 49 (*)    All other components within normal limits  URINALYSIS, ROUTINE W REFLEX MICROSCOPIC - Abnormal; Notable for the following components:   Glucose, UA >=500 (*)    Hgb urine dipstick SMALL (*)    Protein, ur 30 (*)    All other components within normal limits  SURGICAL PCR SCREEN  CBC  PROTIME-INR  APTT  TYPE AND SCREEN   PFTs 02/04/2017: FVC 2.37 (102%), post 2.38 (103%). FEV1 2.02 (112%), post 2.11 (117%).    IMAGES: CTA Neck 08/29/2024: IMPRESSION: 1. Unchanged approximately 80% stenosis at the left carotid bifurcation extending into the ICA due to bulky calcific atherosclerosis. 2. Approximately 70% stenosis at the distal right common carotid artery due to mixed-density atherosclerotic disease, with a widely patent right internal carotid artery stent. The CCA disease has worsened since 09/18/15. 3. Severe stenosis of the proximal left subclavian artery due to bulky calcific atherosclerosis. 4. Moderate atherosclerotic calcification of the carotid siphons and V4 segments, and mild stenosis at the right vertebral artery origin, with both vertebral arteries otherwise widely patent throughout the neck.  US  Carotid 07/10/2024: Summary:  - Right Carotid: The ECA appears >50% stenosed. Patent stent with increased velocity > 75% stenosis. Very high bifurcation, limited  visualization.  - Left Carotid: Velocities in the left ICA are consistent with a 40-59% stenosis. Calcific plaque may obscure higher velocity.  - Vertebrals:  Bilateral vertebral arteries demonstrate antegrade flow.  - Subclavians: Normal flow hemodynamics were seen in bilateral subclavian arteries.   US  Renal 09/05/2023: IMPRESSION: 1. Increased echogenicity of the bilateral renal cortices with multiple right-sided cysts, consistent with medical renal disease. 2.  Nonvisualization of the left ureteral jet.   EKG: 09/07/2024: Normal sinus rhythm  Anteroseptal infarct , age undetermined  Abnormal ECG  When compared with ECG of 17-Oct-2015 06:25, Since last tracing rate slower Confirmed by Jordan, Peter [id number]) on 09/07/2024 2:59:39 P   CV: Long term monitor 03/29/2013 - 04/27/2013: Normal sinus rhythm, sinus tachycardia, PVCs, sinus arrhythmia Dizziness x1 correlated with NSR Palpitations x3 of 4, fatigue x1, chest discomfort x3 correlated PVCs, no ST segment abnormalities.   Echo 02/19/2013: Study Conclusions  Left ventricle: The cavity size was normal. There was severe  concentric hypertrophy. Systolic function was normal. The  estimated ejection fraction was in the range of 55% to 60%.  Wall motion was normal; there were no regional wall motion  abnormalities. Doppler parameters are consistent with  abnormal left ventricular relaxation (grade 1 diastolic  dysfunction).       Nuclear stress test 07/28/2005: IMPRESSION:  This is a low risk scan in a patient with known severe coronary disease. The inferior lateral scar is almost certainly consistent with the occluded right coronary lesion. The overall ejection fraction appears to be preserved. There is no active ischemia. Clinical correlation is therefore advised.   Cardiac cath 07/21/2005: HEMODYNAMICS: 1.  Left main normal. 2.  LAD had proximal calcification.  There was mid 60-70% stenosis with calcification. There was distal  diffuse luminal irregularities. 3.  Circumflex in the AV groove had a long proximal 25% stenosis.  OM1 was large with a long 30% stenosis.  OM2 was small with luminal irregularities.  OM3 was large and branching with a long 25% stenosis. The posterolateral was small and normal. 4.  Right coronary artery was dominant.  It was occluded proximally with bridging collaterals.  There was also some left to right collateralization.  There was TIMI 2-3 flow in the vessel.  The distal vessel was somewhat narrow.  There was a long 30% stenosis before the PDA.  The PDA was small. LEFT VENTRICULOGRAM:  Left ventriculogram was obtained in the RAO projection.  The EF was 65% with mild inferobasilar hypokinesis.  Right heart pressures demonstrated an RA of 1, RV 29/5, PA 23/10 with a mean of 16, and a cardiac output of 7.31 with an index of 3.7.  CONCLUSION:  Severe right coronary artery obstructive disease.  Moderate LAD disease of questionable hemodynamic significance.  Well-preserved ejection fraction.  Normal left ventricular pressures. To further evaluate the hemodynamic significance of the LAD, I will send the patient for a stress perfusion study.  She will need to have an Adenosine as she gets dyspneic with walking.  She needs very aggressive risk reduction.     Past Medical History:  Diagnosis Date   Allergy    Arthritis    Left shoulder   Carotid artery occlusion    CVA (cerebral vascular accident) (HCC) 02-19-13   DM (diabetes mellitus) (HCC)    Dyslipidemia    GERD (gastroesophageal reflux disease)    intermittent Sx   Hematuria    Hip arthritis 2003   Left   HTN (hypertension)    Hyperlipidemia    Renal insufficiency, mild    Sleep related leg cramps     Past Surgical History:  Procedure Laterality Date   CAROTID ENDARTERECTOMY  RIGHT   CHOLECYSTECTOMY     COLONOSCOPY     COLONOSCOPY  10/19/2011   Procedure: COLONOSCOPY;  Surgeon: Margo CHRISTELLA Haddock, MD;  [facility];  Service:  Endoscopy;  Laterality: N/A;  10:15   COLONOSCOPY N/A 08/16/2018   Procedure: COLONOSCOPY;  Surgeon: Haddock Margo CROME, MD;  Location: [facility];  Service: Endoscopy;  Laterality: N/A;  9:30am   PERIPHERAL VASCULAR CATHETERIZATION Right 10/17/2015   Procedure: Carotid PTA/Stent Intervention;  Surgeon: Carlin FORBES Haddock, MD;  Location: [facility] INVASIVE CV LAB;  Service: Cardiovascular;  Laterality: Right;   PERIPHERAL VASCULAR CATHETERIZATION Right 10/17/2015   Procedure: Carotid Angiography;  Surgeon: Carlin FORBES Haddock, MD;  Location: [facility] INVASIVE CV LAB;  Service: Cardiovascular;  Laterality: Right;   S/P Hysterectomy     TOTAL ABDOMINAL HYSTERECTOMY     FIBROID, HYPERMENORRHEA    MEDICATIONS:  acetaminophen  (TYLENOL ) 500 MG tablet   amLODipine  (NORVASC ) 10 MG tablet   aspirin  81 MG tablet   atorvastatin  (LIPITOR ) 80 MG tablet   brimonidine (ALPHAGAN) 0.2 % ophthalmic solution  clopidogrel  (PLAVIX ) 75 MG tablet   dorzolamide-timolol (COSOPT) 2-0.5 % ophthalmic solution   doxazosin  (CARDURA ) 4 MG tablet   empagliflozin  (JARDIANCE ) 10 MG TABS tablet   ezetimibe  (ZETIA ) 10 MG tablet   fexofenadine  (ALLEGRA ) 60 MG tablet   fluticasone  (FLONASE ) 50 MCG/ACT nasal spray   hydrochlorothiazide  (HYDRODIURIL ) 12.5 MG tablet   latanoprost (XALATAN) 0.005 % ophthalmic solution   metFORMIN  (GLUCOPHAGE ) 500 MG tablet   Multiple Vitamins-Minerals (MULTIVITAMIN WOMEN 50+) TABS   olmesartan (BENICAR) 20 MG tablet   ONETOUCH ULTRA test strip   pantoprazole  (PROTONIX ) 40 MG tablet   predniSONE  (DELTASONE ) 50 MG tablet   VENTOLIN  HFA 108 (90 Base) MCG/ACT inhaler   No current facility-administered medications for this encounter.    Isaiah Ruder, PA-C Surgical Short Stay/Anesthesiology [facility] Phone [phone] [facility] Phone [phone] 09/10/2024 3:34 PM

## 2024-09-10 NOTE — Telephone Encounter (Signed)
 Patient is having Surgery Thursday Carotid Artery Dr Otho will be doing surgery patient wanted me to inform you.

## 2024-09-11 ENCOUNTER — Telehealth: Payer: Self-pay

## 2024-09-11 NOTE — Telephone Encounter (Signed)
 Prescription Request  09/11/2024  LOV: Visit date not found  What is the name of the medication or equipment?  DISSOLVE ONE TABLET UNDER THE TONGUE EVERY 5 MINUTES AS    Patient said that she has some but it is old back from 2017 she said Dr Glendia had give to ger to keep on hand just in case she needed it. We Have informed the patient that she may need to consult with the cardiologist when her appt is scheduled.  Have you contacted your pharmacy to request a refill? Yes   Which pharmacy would you like this sent to?  [facility]    Patient notified that their request is being sent to the clinical staff for review and that they should receive a response within 2 business days.   Please advise at Mobile [phone] (mobile)

## 2024-09-11 NOTE — Telephone Encounter (Signed)
 Urgent referral ordered in EPIC, STAT ECHO ordered in EPIC - Patient notified and verbalized understanding.

## 2024-09-11 NOTE — Telephone Encounter (Signed)
 Nurses Please initiate the following and let the patient know the following #1 order echo-reason coronary artery disease and DOE-this is a presurgical evaluation needs to be done urgently #2 urgent cardiology consultation-patient has carotid artery stenosis they would like to do surgery soon as possible but are requesting cardiology evaluation before doing the surgery #3-please call patient let her know that we have coordinated this with her vascular surgeon and they have stated that they want to have these tests completed before doing the carotid procedure-therefore the echo and the cardiology consult needs to be completed first-if everything with that comes back looking good then they will schedule the carotid afterwards to be completed  Let the patient know that we are working hard at trying to get these tests and evaluation completed relatively soon but it could take a little while based on cardiology schedule.  Patient should be attentive to her phone so that if the hospital or cardiology office calls regarding her appointment she will be accessible

## 2024-09-11 NOTE — Telephone Encounter (Signed)
 Pt requesting a refill for nitroglycerin  sublingual tablets not on current med list, please advise

## 2024-09-12 ENCOUNTER — Inpatient Hospital Stay (HOSPITAL_COMMUNITY)
Admission: RE | Admit: 2024-09-12 | Discharge: 2024-09-12 | Disposition: A | Source: Ambulatory Visit | Attending: Family Medicine | Admitting: Family Medicine

## 2024-09-12 ENCOUNTER — Other Ambulatory Visit: Payer: Self-pay | Admitting: Family Medicine

## 2024-09-12 DIAGNOSIS — I10 Essential (primary) hypertension: Secondary | ICD-10-CM | POA: Diagnosis not present

## 2024-09-12 DIAGNOSIS — Z01818 Encounter for other preprocedural examination: Secondary | ICD-10-CM | POA: Diagnosis not present

## 2024-09-12 DIAGNOSIS — Z01810 Encounter for preprocedural cardiovascular examination: Secondary | ICD-10-CM

## 2024-09-12 DIAGNOSIS — R0609 Other forms of dyspnea: Secondary | ICD-10-CM | POA: Insufficient documentation

## 2024-09-12 DIAGNOSIS — I2510 Atherosclerotic heart disease of native coronary artery without angina pectoris: Secondary | ICD-10-CM | POA: Diagnosis not present

## 2024-09-12 LAB — ECHOCARDIOGRAM COMPLETE
Area-P 1/2: 4.6 cm2
P 1/2 time: 399 ms
S' Lateral: 2.6 cm

## 2024-09-12 MED ORDER — NITROGLYCERIN 0.4 MG SL SUBL: 0.4000 mg | SUBLINGUAL_TABLET | SUBLINGUAL | 3 refills | Status: AC | PRN

## 2024-09-12 NOTE — Telephone Encounter (Signed)
 Prescription was sent

## 2024-09-12 NOTE — Progress Notes (Signed)
*  PRELIMINARY RESULTS* Echocardiogram 2D Echocardiogram has been performed.  Kara Kirby 09/12/2024, 9:16 AM

## 2024-09-13 ENCOUNTER — Inpatient Hospital Stay (HOSPITAL_COMMUNITY): Admission: RE | Admit: 2024-09-13 | Source: Home / Self Care | Admitting: Vascular Surgery

## 2024-09-13 ENCOUNTER — Encounter (HOSPITAL_COMMUNITY): Admission: RE | Payer: Self-pay | Source: Home / Self Care

## 2024-09-13 SURGERY — TRANSCAROTID ARTERY REVASCULARIZATION (TCAR)
Anesthesia: General | Laterality: Right

## 2024-09-14 LAB — TYPE AND SCREEN
ABO/RH(D): A POS
Antibody Screen: POSITIVE
Donor AG Type: NEGATIVE
Donor AG Type: NEGATIVE
Unit division: 0
Unit division: 0

## 2024-09-14 LAB — BPAM RBC
Blood Product Expiration Date: 202512092359
Blood Product Expiration Date: 202512112359
ISSUE DATE / TIME: 202512051049
Unit Type and Rh: 6200
Unit Type and Rh: 6200

## 2024-09-18 ENCOUNTER — Ambulatory Visit: Payer: Self-pay | Admitting: Family Medicine

## 2024-09-18 NOTE — Telephone Encounter (Signed)
 Nurses I went to the cardiologist office at [facility] and discussed her case with them.  The front staff person told me that cardiology had tried calling her but got no answer.  She needs a cardiology preoperative consult  More than likely they will try to call her again so she needs to make sure that she realizes they may have a number much different than our number when they call  Also you can give her cardiology number and she can call them directly and she can explain to them to help Dr. Alphonsa referred her for a preoperative consultation and that she missed the phone call when she is requesting for them to set up an appointment for her in [place] Kara Kirby See below Referral sent to: [facility] [address], [place] [address] [phone]

## 2024-09-19 ENCOUNTER — Ambulatory Visit: Attending: Physician Assistant | Admitting: Physician Assistant

## 2024-09-19 ENCOUNTER — Encounter: Payer: Self-pay | Admitting: Physician Assistant

## 2024-09-19 ENCOUNTER — Telehealth (HOSPITAL_BASED_OUTPATIENT_CLINIC_OR_DEPARTMENT_OTHER): Payer: Self-pay | Admitting: *Deleted

## 2024-09-19 VITALS — BP 170/96 | HR 87 | Ht 64.0 in | Wt 158.4 lb

## 2024-09-19 DIAGNOSIS — I5032 Chronic diastolic (congestive) heart failure: Secondary | ICD-10-CM | POA: Diagnosis not present

## 2024-09-19 DIAGNOSIS — E1169 Type 2 diabetes mellitus with other specified complication: Secondary | ICD-10-CM | POA: Diagnosis not present

## 2024-09-19 DIAGNOSIS — Z01810 Encounter for preprocedural cardiovascular examination: Secondary | ICD-10-CM | POA: Diagnosis not present

## 2024-09-19 DIAGNOSIS — I10 Essential (primary) hypertension: Secondary | ICD-10-CM

## 2024-09-19 DIAGNOSIS — E785 Hyperlipidemia, unspecified: Secondary | ICD-10-CM | POA: Diagnosis not present

## 2024-09-19 DIAGNOSIS — I2510 Atherosclerotic heart disease of native coronary artery without angina pectoris: Secondary | ICD-10-CM | POA: Diagnosis not present

## 2024-09-19 DIAGNOSIS — I6523 Occlusion and stenosis of bilateral carotid arteries: Secondary | ICD-10-CM | POA: Diagnosis not present

## 2024-09-19 MED ORDER — OLMESARTAN MEDOXOMIL 20 MG PO TABS: 20.0000 mg | ORAL_TABLET | Freq: Every day | ORAL | 3 refills | Status: AC

## 2024-09-19 NOTE — Progress Notes (Signed)
 OFFICE NOTE:    Date:  09/19/2024  ID:  Kara Kirby, Kara Kirby [birth year], MRN [id number] PCP: Alphonsa Glendia LABOR, MD  [facility] Providers Cardiologist:  Alvan Carrier, MD        Coronary artery disease Cardiac catheterization (07/21/2005): Mid LAD 60-70%, LCX 25%, OM1 30%, OM3 25%, RCA proximal 100% occlusion with bridging collaterals, distal RCA 30%  (HFpEF) heart failure with preserved ejection fraction  Echocardiogram (09/12/2024): Ejection fraction (EF) 65-70%, no regional wall motion abnormalities, moderate concentric left ventricular hypertrophy (LVH), grade 2 diastolic dysfunction, elevated left atrial (LA) pressure, med E/e' 24.6, lat E/e' 18.3, normal right ventricular systolic function (RVSF), mild mitral regurgitation (MR), mild aortic insufficiency (AI), aortic valve (AV) sclerosis  Carotid artery disease Right carotid endarterectomy (June 2004) Right internal carotid artery stenting (October 17, 2015) Carotid US : Right ICA patent stent with >75% stenosis, Left ICA 40-59% stenosis (07/10/2024) History of stroke Hypertension Hyperlipidemia Diabetes mellitus Chronic kidney disease Retinal vein occlusion Aortic atherosclerosis  Glaucoma Gastroesophageal reflux disease        Discussed the use of AI scribe software for clinical note transcription with the patient, who gave verbal consent to proceed. History of Present Illness Kara Kirby is a 78 y.o. female referred by Alphonsa Glendia LABOR, MD for surgical clearance.    She is here alone.  She denies chest discomfort, heaviness, pressure, or tightness. She reports occasional shortness of breath depending on activity, but is able to mow her lawn ([employer]), walk across the parking lot at [place], and perform daily activities such as vacuuming room, going up steps, etc. without significant symptoms. She reports higher blood pressures in the clinic. She usually monitors her blood pressure at [employer].     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 She is divorced with one child, does not consume alcohol or drugs, and has never smoked. She maintains an active lifestyle, mowing her lawn and walking regularly without significant symptoms.  Family history is notable for heart disease.  Her mother, sister both had heart attacks.   ROS-See HPI     Studies Reviewed:       LABS 07/02/2024: Total cholesterol 125, HDL 46, LDL 65, triglycerides 65, J8r 7 09/07/2024: K 3.6, creatinine 1.13, eGFR 49, ALT 22, Hgb 14.6, PLT 256K  EKG 09/07/2024: NSR, HR 72, inferior Q waves, anteroseptal infarct, QTc 431, no change since prior tracing  HYPERTENSION CONTROL Vitals:   09/19/24 1326 09/19/24 1356  BP: (!) 182/110 (!) 170/96    The patient's blood pressure is elevated above target today.  In order to address the patient's elevated BP: A current anti-hypertensive medication was adjusted today.         Physical Exam:  VS:  BP (!) 170/96   Pulse 87   Ht 5' 4 (1.626 m)   Wt 158 lb 6.4 oz (71.8 kg)   SpO2 98%   BMI 27.19 kg/m        Wt Readings from Last 3 Encounters:  09/19/24 158 lb 6.4 oz (71.8 kg)  09/07/24 159 lb 11.2 oz (72.4 kg)  08/31/24 156 lb (70.8 kg)    Constitutional:      Appearance: Healthy appearance. Not in distress.  Neck:     Vascular: No JVR. JVD normal.  Pulmonary:     Breath sounds: Normal breath sounds. No wheezing. No rales.  Cardiovascular:  Normal rate. Regular rhythm.     Murmurs: There is a grade 2/6 systolic murmur at the URSB.  Edema:    Peripheral edema present.    Ankle: bilateral trace edema of the ankle. Abdominal:     Palpations: Abdomen is soft. There is no hepatomegaly.       Assessment and Plan:    Assessment & Plan Preoperative cardiovascular examination [name] has several cardiac risk factors including previous cardiac catheterization with a  chronically occluded RCA and moderate nonobstructive disease in the LAD.  She also has evidence of heart failure with preserved ejection fraction, carotid artery disease, diabetes, hypertension and hyperlipidemia.  Her perioperative risk of a major cardiac event is high at 11% according to the Revised Cardiac Risk Index (RCRI).  However, her functional capacity is good at 5.07 METs according to the Duke Activity Status Index (DASI).  She is able to go up a flight of stairs, walk a block or 2 on level ground, vacuum a room and even push mow her yard without significant issues.  She does not currently have any unstable cardiac conditions.  Recent echocardiogram with normal LV function.  She has no severe valvular heart disease.  Recommendations:  According to ACC/AHA guidelines, no further cardiovascular testing needed.  The patient may proceed to surgery at acceptable risk.  ASA and Clopidogrel  are managed by PCP, vascular surgery.  Coronary artery disease involving native coronary artery of native heart without angina pectoris Cardiac cath in 2006 demonstrated mid LAD 60-70% stenosis, LCX 25%, OM 1 30%, OM 3 25%, RCA proximal 100% with bridging collaterals, and distal RCA 30%. No current anginal symptoms. She is on DAPT with aspirin  and Plavix  due to vascular disease. - Continue aspirin  81 mg daily - Continue Lipitor  80 mg daily - Arrange follow up in our [place] office in 6 mos Bilateral carotid artery stenosis Status post prior R CEA and subsequent right ICA stenting in 2017.  Recent carotid US  shows right ICA patent stent with greater than 75% stenosis. R TCAR has been recommended by vascular surgery. Chronic heart failure with preserved ejection fraction (HFpEF) (HCC) Recent echocardiogram with normal ejection fraction (EF 65-70%) and moderate diastolic dysfunction. Echocardiogram shows elevated E/e' consistent with increased left atrial pressure. She has no significant shortness of breath. She has  NYHA class II symptoms. Her volume status stable. She has likely been kept euvolemic with thiazide diuretic and Jardiance . - Continue HCTZ 12.5 mg daily - Continue Jardiance  10 mg daily - Arrange follow up in 6 mos. Primary hypertension Hypertension with higher readings in clinic versus home. Blood pressure uncontrolled, currently on amlodipine  and olmesartan. - Increase olmesartan to 20 mg daily - Continue hydrochlorothiazide  12.5 mg once daily.  - Continue amlodipine  10 mg daily - Monitor blood pressure at home and report to primary care - Consider addition of Spironolactone if BP remains difficult to control, given HFpEF diagnosis. Hyperlipidemia associated with type 2 diabetes mellitus (HCC) Goal LDL <70, ideally <55. LDL in September 2025 was 49 which is acceptable. - Continue Lipitor  80 mg daily - Continue Zetia  10 mg daily        Dispo:  Return in about 6 months (around 03/20/2025) for Routine Follow Up w/ Dr. Alvan in [place].  Signed, Glendia Ferrier, PA-C

## 2024-09-19 NOTE — Patient Instructions (Signed)
 Medication Instructions:  Your physician has recommended you make the following change in your medication:  INCREASE Olmesartan to 20 mg taking 1 daily  *If you need a refill on your cardiac medications before your next appointment, please call your pharmacy*  Lab Work: None ordered  If you have labs (blood work) drawn today and your tests are completely normal, you will receive your results only by: MyChart Message (if you have MyChart) OR A paper copy in the mail If you have any lab test that is abnormal or we need to change your treatment, we will call you to review the results.  Testing/Procedures: None ordered  Follow-Up: At [facility], you and your health needs are our priority.  As part of our continuing mission to provide you with exceptional heart care, our providers are all part of one team.  This team includes your primary Cardiologist (physician) and Advanced Practice Providers or APPs (Physician Assistants and Nurse Practitioners) who all work together to provide you with the care you need, when you need it.  Your next appointment:   6 month(s)  Provider:   Dorn Ross, MD    We recommend signing up for the patient portal called MyChart.  Sign up information is provided on this After Visit Summary.  MyChart is used to connect with patients for Virtual Visits (Telemedicine).  Patients are able to view lab/test results, encounter notes, upcoming appointments, etc.  Non-urgent messages can be sent to your provider as well.   To learn more about what you can do with MyChart, go to [email or web address].   Other Instructions

## 2024-09-19 NOTE — Telephone Encounter (Signed)
 Sounds good thank you

## 2024-09-19 NOTE — Assessment & Plan Note (Signed)
 Hypertension with higher readings in clinic versus home. Blood pressure uncontrolled, currently on amlodipine  and olmesartan. - Increase olmesartan to 20 mg daily - Continue hydrochlorothiazide  12.5 mg once daily.  - Continue amlodipine  10 mg daily - Monitor blood pressure at home and report to primary care - Consider addition of Spironolactone if BP remains difficult to control, given HFpEF diagnosis.

## 2024-09-19 NOTE — Telephone Encounter (Signed)
 Notes faxed to surgeon Glendia Ferrier, PA-C    09/19/2024 5:56 PM

## 2024-09-19 NOTE — Assessment & Plan Note (Signed)
 Status post prior R CEA and subsequent right ICA stenting in 2017.  Recent carotid US  shows right ICA patent stent with greater than 75% stenosis. R TCAR has been recommended by vascular surgery.

## 2024-09-19 NOTE — Assessment & Plan Note (Signed)
 Goal LDL <70, ideally <55. LDL in September 2025 was 59 which is acceptable. - Continue Lipitor  80 mg daily - Continue Zetia  10 mg daily

## 2024-09-19 NOTE — Telephone Encounter (Signed)
° °  Pre-operative Risk Assessment    Patient Name: Kara Kirby  DOB: [birth year] MRN: [id number]   Date of last office visit: 07/21/2005 DR. HOCHREIN  Date of next office visit: 09/19/24 Kara Kirby, [facility]   Request for Surgical Clearance    Procedure:  RIGHT TCAR  Date of Surgery:  Clearance TBD   PER FORM ASAP                             Surgeon:  DR. PEARLINE Surgeon's Group or Practice Name:  [facility] Phone number:  [phone] Fax number:  [phone]   Type of Clearance Requested:   - Medical ; NONE INDICATED ON FORM TO BE HELD; PT IS ON PLAVIX  AND ASA; LEFT MESSAGE IF PLAVIX  NEEDS TO BE HELD Type of Anesthesia:  General    Additional requests/questions:    Bonney Niels Jest   09/19/2024, 9:45 AM

## 2024-09-19 NOTE — Telephone Encounter (Signed)
 ADDENDUM ON CLEARANCE:   I had called surgeon office in regard to if Plavix  needed to be held.   Per Alan Glance, RN: Re: the plavix  - she may stay on it. We don't stop ASA or Plavis for our TCAR patients. :-D

## 2024-09-20 ENCOUNTER — Encounter: Attending: Family Medicine | Admitting: Nutrition

## 2024-09-20 ENCOUNTER — Encounter: Payer: Self-pay | Admitting: Nutrition

## 2024-09-20 VITALS — Ht 64.0 in | Wt 157.0 lb

## 2024-09-20 DIAGNOSIS — I10 Essential (primary) hypertension: Secondary | ICD-10-CM | POA: Diagnosis present

## 2024-09-20 DIAGNOSIS — E118 Type 2 diabetes mellitus with unspecified complications: Secondary | ICD-10-CM | POA: Insufficient documentation

## 2024-09-20 DIAGNOSIS — E782 Mixed hyperlipidemia: Secondary | ICD-10-CM | POA: Insufficient documentation

## 2024-09-20 NOTE — Patient Instructions (Addendum)
 Goals Keep up the great job! Keep eating foods that grown in a garden. Keep drinking water  Get A1C to 6% Focus on eating more high fiber foods as tolerated.

## 2024-09-20 NOTE — Progress Notes (Signed)
 Medical Nutrition Therapy:  Appt start time: 1105  end time:  11 Assessment:  Primary concerns today: Diabetes Type 2. PCP: Dr. Alphonsa    Jardiance  and Metformin . Has cut down on diet soda and processed meats. Drinking more water . FBS's in the 120's mg/dl. Having surgery to put in a stent in her carotid artery she reports. Stays active going to [employer]. Cooks foods at home using whole plant based foods. Feels good.  A1C 7%.   Wt Readings from Last 3 Encounters:  09/20/24 157 lb (71.2 kg)  09/19/24 158 lb 6.4 oz (71.8 kg)  09/07/24 159 lb 11.2 oz (72.4 kg)   Ht Readings from Last 3 Encounters:  09/20/24 5' 4 (1.626 m)  09/19/24 5' 4 (1.626 m)  09/07/24 5' 4 (1.626 m)   Body mass index is 26.95 kg/m. @BMIFA @ Facility age limit for growth %iles is 20 years. Facility age limit for growth %iles is 20 years.  Lab Results  Component Value Date   HGBA1C 7.0 (H) 07/02/2024         Latest Ref Rng & Units 09/07/2024   10:44 AM 08/29/2024   10:10 AM 07/02/2024    9:48 AM  CMP  Glucose 70 - 99 mg/dL 755   836   BUN 8 - 23 mg/dL 15   16   Creatinine 9.55 - 1.00 mg/dL 8.86  8.69  9.02   Sodium 135 - 145 mmol/L 141   142   Potassium 3.5 - 5.1 mmol/L 3.6   4.0   Chloride 98 - 111 mmol/L 103   101   CO2 22 - 32 mmol/L 27   26   Calcium  8.9 - 10.3 mg/dL 9.5   9.4   Total Protein 6.5 - 8.1 g/dL 6.5   6.7   Total Bilirubin 0.0 - 1.2 mg/dL 0.7   0.6   Alkaline Phos 38 - 126 U/L 46   59   AST 15 - 41 U/L 19   18   ALT 0 - 44 U/L 22   19      Preferred Learning Style:  No preference indicated   Learning Readiness:   Ready Change in progress   MEDICATIONS: see list   DIETARY INTAKE:   24-hr recall:  B) Oatmeal, 1/2 banana and blueberries or apples.   L) Bologna sandwich with tomatoes, lettuce, water   Nacho chips.   D) Chicken, brussel sprouts, and baked potato, water    Usual physical activity: walks,  Estimated energy needs: 1500   calories 170 g carbohydrates 112 g protein 42g fat  Progress Towards Goal(s):  In progress.Goals Nutritional Diagnosis:  NB-1.1 Food and nutrition-related knowledge deficit As related to Diabetes Type 2.  As evidenced by A1C .    Intervention:  Nutrition and Diabetes education provided on My Plate, CHO counting, meal planning, portion sizes, timing of meals, avoiding snacks between meals unless having a low blood sugar, target ranges for A1C and blood sugars, signs/symptoms and treatment of hyper/hypoglycemia, monitoring blood sugars, taking medications as prescribed, benefits of exercising 30 minutes per day and prevention of complications of DM. SABRA Goals Keep up the great job! Keep eating foods that grown in a garden. Keep drinking water  Get A1C to 6% Increase high fiber foods as tolerated.   Teaching Method Utilized:   Auditory  Handouts given during visit include: Verbally went over Plate Method, CHO counting and meal planning   Barriers to learning/adherence to lifestyle change: none  Demonstrated degree of understanding  via:  Teach Back   Monitoring/Evaluation:  Dietary intake, exercise, , and body weight in 6 months.SABRA

## 2024-09-25 ENCOUNTER — Other Ambulatory Visit: Payer: Self-pay

## 2024-09-25 DIAGNOSIS — I6523 Occlusion and stenosis of bilateral carotid arteries: Secondary | ICD-10-CM

## 2024-09-26 ENCOUNTER — Encounter (HOSPITAL_COMMUNITY): Payer: Self-pay | Admitting: Vascular Surgery

## 2024-09-26 NOTE — Anesthesia Preprocedure Evaluation (Signed)
 Anesthesia Evaluation  Patient identified by MRN, date of birth, ID band Patient awake    Reviewed: Allergy & Precautions, NPO status , Patient's Chart, lab work & pertinent test results  Airway Mallampati: II  TM Distance: >3 FB Neck ROM: Full    Dental no notable dental hx.    Pulmonary neg pulmonary ROS   Pulmonary exam normal        Cardiovascular hypertension, Pt. on medications Normal cardiovascular exam     Neuro/Psych CVA    GI/Hepatic Neg liver ROS,GERD  Medicated and Controlled,,  Endo/Other  diabetes, Oral Hypoglycemic Agents    Renal/GU Renal disease     Musculoskeletal   Abdominal   Peds  Hematology  (+) Blood dyscrasia (Plavix )   Anesthesia Other Findings Bilateral Carotid stenosis  Reproductive/Obstetrics                              Anesthesia Physical Anesthesia Plan  ASA: 3  Anesthesia Plan: General   Post-op Pain Management:    Induction: Intravenous  PONV Risk Score and Plan: 3 and Ondansetron , Dexamethasone and Treatment may vary due to age or medical condition  Airway Management Planned: Oral ETT  Additional Equipment: Arterial line  Intra-op Plan:   Post-operative Plan: Extubation in OR  Informed Consent: I have reviewed the patients History and Physical, chart, labs and discussed the procedure including the risks, benefits and alternatives for the proposed anesthesia with the patient or authorized representative who has indicated his/her understanding and acceptance.     Dental advisory given  Plan Discussed with: CRNA  Anesthesia Plan Comments: (PAT note written 09/26/2024 by Ilijah Doucet, PA-C.  )         Anesthesia Quick Evaluation

## 2024-09-26 NOTE — Progress Notes (Signed)
 PCP - Glendia Fielding, MD Cardiologist - Dorn Ross, MD  EKG - 09/07/24 ECHO - 09/12/24  Fasting Blood Sugar - 100-140 Checks Blood Sugar 3-4/day  Blood Thinner Instructions: Con't Plavix  Aspirin  Instructions: Con't  Anesthesia review: Y  Patient verbally denies any shortness of breath, fever, cough and chest pain during phone call   -------------  SDW INSTRUCTIONS given:  Your procedure is scheduled on Thurs, December 18th.  Report to [facility] Main Entrance A at 0530 A.M., and check in at the Admitting office.  Call this number if you have problems the morning of surgery:  [phone]   Remember:  Do not eat or drink after midnight the night before your surgery   Take these medicines the morning of surgery with A SIP OF WATER   amLODipine  (NORVASC )  aspirin   brimonidine  (ALPHAGAN )  clopidogrel  (PLAVIX )  COSOPT   ezetimibe  (ZETIA )  FLONASE   acetaminophen  (TYLENOL )-if needed ALLEGRA -if needed PROTONIX -if needed VENTOLIN -if needed (Please bring with you on the day of surgery)  **metFORMIN ** DO NOT take on the day of surgery **empagliflozin  (JARDIANCE )**stop taking 3 days before your procedure  As of today, STOP taking any Aspirin  (unless otherwise instructed by your surgeon) Aleve, Naproxen, Ibuprofen, Motrin, Advil, Goody's, BC's, all herbal medications, fish oil, and all vitamins.                      Do not wear jewelry, make up, or nail polish            Do not wear lotions, powders, perfumes/colognes, or deodorant.            Do not shave 48 hours prior to surgery.  Men may shave face and neck.            Do not bring valuables to the hospital.            [facility] is not responsible for any belongings or valuables.  Do NOT Smoke (Tobacco/Vaping) 24 hours prior to your procedure If you use a CPAP at night, you may bring all equipment for your overnight stay.   Contacts, glasses, dentures or bridgework may not be worn into surgery.      For patients  admitted to the hospital, discharge time will be determined by your treatment team.   Patients discharged the day of surgery will not be allowed to drive home, and someone needs to stay with them for 24 hours.    Special instructions:   - Preparing For Surgery  Before surgery, you can play an important role. Because skin is not sterile, your skin needs to be as free of germs as possible. You can reduce the number of germs on your skin by washing with CHG (chlorahexidine gluconate) Soap before surgery.  CHG is an antiseptic cleaner which kills germs and bonds with the skin to continue killing germs even after washing.    Oral Hygiene is also important to reduce your risk of infection.  Remember - BRUSH YOUR TEETH THE MORNING OF SURGERY WITH YOUR REGULAR TOOTHPASTE  Please do not use if you have an allergy to CHG or antibacterial soaps. If your skin becomes reddened/irritated stop using the CHG.  Do not shave (including legs and underarms) for at least 48 hours prior to first CHG shower. It is OK to shave your face.  Please follow these instructions carefully.   Shower the NIGHT BEFORE SURGERY and the MORNING OF SURGERY with DIAL Soap.   Pat yourself dry with a  CLEAN TOWEL.  Wear CLEAN PAJAMAS to bed the night before surgery  Place CLEAN SHEETS on your bed the night of your first shower and DO NOT SLEEP WITH PETS.   Day of Surgery: Please shower morning of surgery  Wear Clean/Comfortable clothing the morning of surgery Do not apply any deodorants/lotions.   Remember to brush your teeth WITH YOUR REGULAR TOOTHPASTE.   Questions were answered. Patient verbalized understanding of instructions.

## 2024-09-26 NOTE — Progress Notes (Signed)
 Anesthesia APP Follow-up:  Case: [id number] Date/Time: 09/27/24 0715   Procedure: TRANSCAROTID ARTERY REVASCULARIZATION (TCAR) (Right)   Anesthesia type: General   Diagnosis: Bilateral carotid artery stenosis [I65.23]   Pre-op diagnosis: BL Carotid stenosis   [facility] ROOM [facility] / [facility] OR   Surgeons: Pearline Norman RAMAN, MD       DISCUSSION: See my previous note from Date of Service 09/07/2024. Right TCAR delayed until she could have echocardiogram and preoperative cardiology evaluation. 09/12/2024 TTE showed LVEF 65-70%, no RWMA, moderate concentric LVH, grade II DD, normal RV systolic function, mild MR/AR. She had cardiology evaluation with Lelon Hamilton, PA-C on 09/19/2024 who wrote, Kara Kirby has several cardiac risk factors including previous cardiac catheterization with a chronically occluded RCA and moderate nonobstructive disease in the LAD.  She also has evidence of heart failure with preserved ejection fraction, carotid artery disease, diabetes, hypertension and hyperlipidemia.  Her perioperative risk of a major cardiac event is high at 11% according to the Revised Cardiac Risk Index (RCRI).  However, her functional capacity is good at 5.07 METs according to the Duke Activity Status Index (DASI).  She is able to go up a flight of stairs, walk a block or 2 on level ground, vacuum a room and even push mow her yard without significant issues.  She does not currently have any unstable cardiac conditions.  Recent echocardiogram with normal LV function.  She has no severe valvular heart disease.   Recommendations:  According to ACC/AHA guidelines, no further cardiovascular testing needed.  The patient may proceed to surgery at acceptable risk.  ASA and Clopidogrel  are managed by PCP, vascular surgery.  She may continue ASA and Plavix  per [facility]. Anesthesia team to evaluate on the day of surgery. Updated labs as needed on arrival. Last results 09/07/2024. T&S has expired.   VS:  Wt Readings from  Last 3 Encounters:  09/20/24 71.2 kg  09/19/24 71.8 kg  09/07/24 72.4 kg   BP Readings from Last 3 Encounters:  09/19/24 (!) 170/96  09/07/24 (!) 174/73  08/31/24 (!) 159/83   Pulse Readings from Last 3 Encounters:  09/19/24 87  09/07/24 88  08/31/24 78     PROVIDERS: Alphonsa Hamilton LABOR, MD  is PCP  Gaynell Clos, MD is nephrologist Alvan Carrier, MD is cardiologist   LABS: Updated labs as needed on arrival. Most recent lab results in [facility] include: Lab Results  Component Value Date   WBC 6.8 09/07/2024   HGB 14.6 09/07/2024   HCT 45.5 09/07/2024   PLT 256 09/07/2024   GLUCOSE 244 (H) 09/07/2024   CHOL 125 07/02/2024   TRIG 65 07/02/2024   HDL 46 07/02/2024   LDLCALC 65 07/02/2024   ALT 22 09/07/2024   AST 19 09/07/2024   NA 141 09/07/2024   K 3.6 09/07/2024   CL 103 09/07/2024   CREATININE 1.13 (H) 09/07/2024   BUN 15 09/07/2024   CO2 27 09/07/2024   TSH 1.520 05/19/2020   INR 0.9 09/07/2024   HGBA1C 7.0 (H) 07/02/2024   MICROALBUR 0.8 09/10/2014    IMAGES: CTA Neck 08/29/2024: IMPRESSION: 1. Unchanged approximately 80% stenosis at the left carotid bifurcation extending into the ICA due to bulky calcific atherosclerosis. 2. Approximately 70% stenosis at the distal right common carotid artery due to mixed-density atherosclerotic disease, with a widely patent right internal carotid artery stent. The CCA disease has worsened since 09/18/15. 3. Severe stenosis of the proximal left subclavian artery due to bulky calcific atherosclerosis. 4.  Moderate atherosclerotic calcification of the carotid siphons and V4 segments, and mild stenosis at the right vertebral artery origin, with both vertebral arteries otherwise widely patent throughout the neck.   US  Carotid 07/10/2024: Summary:  - Right Carotid: The ECA appears >50% stenosed. Patent stent with increased velocity > 75% stenosis. Very high bifurcation, limited visualization.  - Left Carotid: Velocities in  the left ICA are consistent with a 40-59% stenosis. Calcific plaque may obscure higher velocity.  - Vertebrals:  Bilateral vertebral arteries demonstrate antegrade flow.  - Subclavians: Normal flow hemodynamics were seen in bilateral subclavian arteries.    US  Renal 09/05/2023: IMPRESSION: 1. Increased echogenicity of the bilateral renal cortices with multiple right-sided cysts, consistent with medical renal disease. 2.  Nonvisualization of the left ureteral jet.     EKG: 09/07/2024: Normal sinus rhythm  Anteroseptal infarct , age undetermined  Abnormal ECG  When compared with ECG of 17-Oct-2015 06:25, Since last tracing rate slower Confirmed by Jordan, Peter [phone]) on 09/07/2024 2:59:39 P     CV: Echo 09/12/2024: IMPRESSIONS   1. Left ventricular ejection fraction, by estimation, is 65 to 70%. The  left ventricle has normal function. The left ventricle has no regional  wall motion abnormalities. There is moderate concentric left ventricular  hypertrophy. Left ventricular  diastolic parameters are consistent with Grade II diastolic dysfunction  (pseudonormalization). Elevated left atrial pressure.   2. Right ventricular systolic function is normal. The right ventricular  size is normal. Tricuspid regurgitation signal is inadequate for assessing  PA pressure.   3. Left atrial size was upper normal.   4. The mitral valve is degenerative. Mild mitral valve regurgitation.   5. The aortic valve is tricuspid. There is mild calcification of the  aortic valve. Aortic valve regurgitation is mild. Aortic valve sclerosis  is present, with no evidence of aortic valve stenosis.   6. The inferior vena cava is normal in size with greater than 50%  respiratory variability, suggesting right atrial pressure of 3 mmHg.  - Comparison(s): Prior images unable to be directly viewed.  - TTE 02/20/2023: LV cavity size normal. Severe LVH. LVEF 55-60%, no RWMA. Grade 1 DD.    Long term monitor 03/29/2013  - 04/27/2013: Normal sinus rhythm, sinus tachycardia, PVCs, sinus arrhythmia Dizziness x1 correlated with NSR Palpitations x3 of 4, fatigue x1, chest discomfort x3 correlated PVCs, no ST segment abnormalities.     Nuclear stress test 07/28/2005: IMPRESSION:  This is a low risk scan in a patient with known severe coronary disease. The inferior lateral scar is almost certainly consistent with the occluded right coronary lesion. The overall ejection fraction appears to be preserved. There is no active ischemia. Clinical correlation is therefore advised.    Cardiac cath 07/21/2005: HEMODYNAMICS: 1.  Left main normal. 2.  LAD had proximal calcification.  There was mid 60-70% stenosis with calcification. There was distal diffuse luminal irregularities. 3.  Circumflex in the AV groove had a long proximal 25% stenosis.  OM1 was large with a long 30% stenosis.  OM2 was small with luminal irregularities.  OM3 was large and branching with a long 25% stenosis. The posterolateral was small and normal. 4.  Right coronary artery was dominant.  It was occluded proximally with bridging collaterals.  There was also some left to right collateralization.  There was TIMI 2-3 flow in the vessel.  The distal vessel was somewhat narrow.  There was a long 30% stenosis before the PDA.  The PDA was small. LEFT VENTRICULOGRAM:  Left ventriculogram was obtained in the RAO projection.  The EF was 65% with mild inferobasilar hypokinesis.  Right heart pressures demonstrated an RA of 1, RV 29/5, PA 23/10 with a mean of 16, and a cardiac output of 7.31 with an index of 3.7.   CONCLUSION:  Severe right coronary artery obstructive disease.  Moderate LAD disease of questionable hemodynamic significance.  Well-preserved ejection fraction.  Normal left ventricular pressures. To further evaluate the hemodynamic significance of the LAD, I will send the patient for a stress perfusion study.  She will need to have an Adenosine as she gets  dyspneic with walking.  She needs very aggressive risk reduction.    Past Medical History:  Diagnosis Date   Allergy    Arthritis    Left shoulder   Carotid artery occlusion    CVA (cerebral vascular accident) (HCC) 02-19-13   DM (diabetes mellitus) (HCC)    Dyslipidemia    GERD (gastroesophageal reflux disease)    intermittent Sx   Hematuria    Hip arthritis 2003   Left   HTN (hypertension)    Hyperlipidemia    Renal insufficiency, mild    Sleep related leg cramps     Past Surgical History:  Procedure Laterality Date   CAROTID ENDARTERECTOMY  RIGHT   CHOLECYSTECTOMY     COLONOSCOPY     COLONOSCOPY  10/19/2011   Procedure: COLONOSCOPY;  Surgeon: Margo CHRISTELLA Haddock, MD;  [facility]: [facility];  Service: Endoscopy;  Laterality: N/A;  10:15   COLONOSCOPY N/A 08/16/2018   Procedure: COLONOSCOPY;  Surgeon: Haddock Margo CROME, MD;  Location: [facility];  Service: Endoscopy;  Laterality: N/A;  9:30am   PERIPHERAL VASCULAR CATHETERIZATION Right 10/17/2015   Procedure: Carotid PTA/Stent Intervention;  Surgeon: Carlin FORBES Haddock, MD;  Location[facility] INVASIVE CV LAB;  Service: Cardiovascular;  Laterality: Right;   PERIPHERAL VASCULAR CATHETERIZATION Right 10/17/2015   Procedure: Carotid Angiography;  Surgeon: Carlin FORBES Haddock, MD;  Location: [facility] INVASIVE CV LAB;  Service: Cardiovascular;  Laterality: Right;   S/P Hysterectomy     TOTAL ABDOMINAL HYSTERECTOMY     FIBROID, HYPERMENORRHEA    MEDICATIONS:  acetaminophen  (TYLENOL ) 500 MG tablet   amLODipine  (NORVASC ) 10 MG tablet   aspirin  81 MG tablet   atorvastatin  (LIPITOR ) 80 MG tablet   brimonidine  (ALPHAGAN ) 0.2 % ophthalmic solution   clopidogrel  (PLAVIX ) 75 MG tablet   dorzolamide -timolol  (COSOPT ) 2-0.5 % ophthalmic solution   doxazosin  (CARDURA ) 4 MG tablet   empagliflozin  (JARDIANCE ) 10 MG TABS tablet   ezetimibe  (ZETIA ) 10 MG tablet   fexofenadine  (ALLEGRA ) 60 MG tablet   fluticasone  (FLONASE ) 50 MCG/ACT nasal spray    hydrochlorothiazide  (HYDRODIURIL ) 12.5 MG tablet   latanoprost  (XALATAN ) 0.005 % ophthalmic solution   metFORMIN  (GLUCOPHAGE ) 500 MG tablet   Multiple Vitamins-Minerals (MULTIVITAMIN WOMEN 50+) TABS   nitroGLYCERIN  (NITROSTAT ) 0.4 MG SL tablet   olmesartan  (BENICAR ) 20 MG tablet   ONETOUCH ULTRA test strip   pantoprazole  (PROTONIX ) 40 MG tablet   VENTOLIN  HFA 108 (90 Base) MCG/ACT inhaler   [facility] had previously instructed her to hold Jardiance  for 3 days prior to surgery.   Kara Ruder, PA-C Surgical Short Stay/Anesthesiology [facility] Phone [phone] [facility] Phone [phone] 09/26/2024 3:40 PM

## 2024-09-27 ENCOUNTER — Other Ambulatory Visit: Payer: Self-pay

## 2024-09-27 ENCOUNTER — Encounter (HOSPITAL_COMMUNITY): Payer: Self-pay | Admitting: Vascular Surgery

## 2024-09-27 ENCOUNTER — Inpatient Hospital Stay (HOSPITAL_COMMUNITY): Admitting: Certified Registered Nurse Anesthetist

## 2024-09-27 ENCOUNTER — Inpatient Hospital Stay (HOSPITAL_COMMUNITY)

## 2024-09-27 ENCOUNTER — Encounter (HOSPITAL_COMMUNITY): Admission: RE | Disposition: A | Payer: Self-pay | Source: Home / Self Care | Attending: Vascular Surgery

## 2024-09-27 ENCOUNTER — Inpatient Hospital Stay (HOSPITAL_COMMUNITY)
Admission: RE | Admit: 2024-09-27 | Discharge: 2024-09-28 | DRG: 035 | Disposition: A | Discharge status: Home / Self Care | Attending: Vascular Surgery | Admitting: Vascular Surgery

## 2024-09-27 DIAGNOSIS — I700 Atherosclerosis of aorta: Secondary | ICD-10-CM | POA: Diagnosis present

## 2024-09-27 DIAGNOSIS — I5030 Unspecified diastolic (congestive) heart failure: Secondary | ICD-10-CM | POA: Diagnosis present

## 2024-09-27 DIAGNOSIS — L7632 Postprocedural hematoma of skin and subcutaneous tissue following other procedure: Secondary | ICD-10-CM | POA: Diagnosis not present

## 2024-09-27 DIAGNOSIS — E1151 Type 2 diabetes mellitus with diabetic peripheral angiopathy without gangrene: Secondary | ICD-10-CM | POA: Diagnosis present

## 2024-09-27 DIAGNOSIS — Z888 Allergy status to other drugs, medicaments and biological substances status: Secondary | ICD-10-CM

## 2024-09-27 DIAGNOSIS — Z9049 Acquired absence of other specified parts of digestive tract: Secondary | ICD-10-CM

## 2024-09-27 DIAGNOSIS — I6521 Occlusion and stenosis of right carotid artery: Secondary | ICD-10-CM | POA: Diagnosis present

## 2024-09-27 DIAGNOSIS — Z83438 Family history of other disorder of lipoprotein metabolism and other lipidemia: Secondary | ICD-10-CM

## 2024-09-27 DIAGNOSIS — H2513 Age-related nuclear cataract, bilateral: Secondary | ICD-10-CM | POA: Diagnosis present

## 2024-09-27 DIAGNOSIS — H401123 Primary open-angle glaucoma, left eye, severe stage: Secondary | ICD-10-CM | POA: Diagnosis present

## 2024-09-27 DIAGNOSIS — E1122 Type 2 diabetes mellitus with diabetic chronic kidney disease: Secondary | ICD-10-CM | POA: Diagnosis present

## 2024-09-27 DIAGNOSIS — Y713 Surgical instruments, materials and cardiovascular devices (including sutures) associated with adverse incidents: Secondary | ICD-10-CM | POA: Diagnosis not present

## 2024-09-27 DIAGNOSIS — N1831 Chronic kidney disease, stage 3a: Secondary | ICD-10-CM

## 2024-09-27 DIAGNOSIS — I6523 Occlusion and stenosis of bilateral carotid arteries: Secondary | ICD-10-CM

## 2024-09-27 DIAGNOSIS — Y831 Surgical operation with implant of artificial internal device as the cause of abnormal reaction of the patient, or of later complication, without mention of misadventure at the time of the procedure: Secondary | ICD-10-CM | POA: Diagnosis not present

## 2024-09-27 DIAGNOSIS — I130 Hypertensive heart and chronic kidney disease with heart failure and stage 1 through stage 4 chronic kidney disease, or unspecified chronic kidney disease: Secondary | ICD-10-CM | POA: Diagnosis present

## 2024-09-27 DIAGNOSIS — Z9889 Other specified postprocedural states: Secondary | ICD-10-CM

## 2024-09-27 DIAGNOSIS — K219 Gastro-esophageal reflux disease without esophagitis: Secondary | ICD-10-CM | POA: Diagnosis present

## 2024-09-27 DIAGNOSIS — Z79899 Other long term (current) drug therapy: Secondary | ICD-10-CM | POA: Diagnosis not present

## 2024-09-27 DIAGNOSIS — E1136 Type 2 diabetes mellitus with diabetic cataract: Secondary | ICD-10-CM | POA: Diagnosis present

## 2024-09-27 DIAGNOSIS — Z8249 Family history of ischemic heart disease and other diseases of the circulatory system: Secondary | ICD-10-CM

## 2024-09-27 DIAGNOSIS — Z833 Family history of diabetes mellitus: Secondary | ICD-10-CM

## 2024-09-27 DIAGNOSIS — Z7902 Long term (current) use of antithrombotics/antiplatelets: Secondary | ICD-10-CM

## 2024-09-27 DIAGNOSIS — Z7982 Long term (current) use of aspirin: Secondary | ICD-10-CM | POA: Diagnosis not present

## 2024-09-27 DIAGNOSIS — Y92238 Other place in hospital as the place of occurrence of the external cause: Secondary | ICD-10-CM | POA: Diagnosis not present

## 2024-09-27 DIAGNOSIS — Z90710 Acquired absence of both cervix and uterus: Secondary | ICD-10-CM

## 2024-09-27 DIAGNOSIS — E785 Hyperlipidemia, unspecified: Secondary | ICD-10-CM | POA: Diagnosis present

## 2024-09-27 DIAGNOSIS — Z7984 Long term (current) use of oral hypoglycemic drugs: Secondary | ICD-10-CM

## 2024-09-27 DIAGNOSIS — M1612 Unilateral primary osteoarthritis, left hip: Secondary | ICD-10-CM | POA: Diagnosis present

## 2024-09-27 DIAGNOSIS — Z8673 Personal history of transient ischemic attack (TIA), and cerebral infarction without residual deficits: Secondary | ICD-10-CM

## 2024-09-27 DIAGNOSIS — E113299 Type 2 diabetes mellitus with mild nonproliferative diabetic retinopathy without macular edema, unspecified eye: Secondary | ICD-10-CM | POA: Diagnosis present

## 2024-09-27 DIAGNOSIS — H401111 Primary open-angle glaucoma, right eye, mild stage: Secondary | ICD-10-CM | POA: Diagnosis present

## 2024-09-27 DIAGNOSIS — M19012 Primary osteoarthritis, left shoulder: Secondary | ICD-10-CM | POA: Diagnosis present

## 2024-09-27 DIAGNOSIS — I129 Hypertensive chronic kidney disease with stage 1 through stage 4 chronic kidney disease, or unspecified chronic kidney disease: Secondary | ICD-10-CM | POA: Diagnosis not present

## 2024-09-27 DIAGNOSIS — Z91041 Radiographic dye allergy status: Secondary | ICD-10-CM

## 2024-09-27 HISTORY — PX: TRANSCAROTID ARTERY REVASCULARIZATIONÂ: SHX6778

## 2024-09-27 LAB — COMPREHENSIVE METABOLIC PANEL WITH GFR
ALT: 29 U/L (ref 0–44)
AST: 28 U/L (ref 15–41)
Albumin: 4.1 g/dL (ref 3.5–5.0)
Alkaline Phosphatase: 50 U/L (ref 38–126)
Anion gap: 11 (ref 5–15)
BUN: 19 mg/dL (ref 8–23)
CO2: 26 mmol/L (ref 22–32)
Calcium: 9.6 mg/dL (ref 8.9–10.3)
Chloride: 102 mmol/L (ref 98–111)
Creatinine, Ser: 1 mg/dL (ref 0.44–1.00)
GFR, Estimated: 57 mL/min — ABNORMAL LOW (ref >60)
Glucose, Bld: 159 mg/dL — ABNORMAL HIGH (ref 70–99)
Potassium: 3.5 mmol/L (ref 3.5–5.1)
Sodium: 139 mmol/L (ref 135–145)
Total Bilirubin: 0.5 mg/dL (ref 0.0–1.2)
Total Protein: 6.6 g/dL (ref 6.5–8.1)

## 2024-09-27 LAB — CBC
HCT: 42 % (ref 36.0–46.0)
Hemoglobin: 14 g/dL (ref 12.0–15.0)
MCH: 30.8 pg (ref 26.0–34.0)
MCHC: 33.3 g/dL (ref 30.0–36.0)
MCV: 92.5 fL (ref 80.0–100.0)
Platelets: 249 K/uL (ref 150–400)
RBC: 4.54 MIL/uL (ref 3.87–5.11)
RDW: 13.5 % (ref 11.5–15.5)
WBC: 6.1 K/uL (ref 4.0–10.5)
nRBC: 0 % (ref 0.0–0.2)

## 2024-09-27 LAB — GLUCOSE, CAPILLARY
Glucose-Capillary: 153 mg/dL — ABNORMAL HIGH (ref 70–99)
Glucose-Capillary: 162 mg/dL — ABNORMAL HIGH (ref 70–99)
Glucose-Capillary: 166 mg/dL — ABNORMAL HIGH (ref 70–99)
Glucose-Capillary: 179 mg/dL — ABNORMAL HIGH (ref 70–99)
Glucose-Capillary: 214 mg/dL — ABNORMAL HIGH (ref 70–99)

## 2024-09-27 LAB — PROTIME-INR
INR: 0.9 (ref 0.8–1.2)
Prothrombin Time: 12.7 s (ref 11.4–15.2)

## 2024-09-27 LAB — POCT ACTIVATED CLOTTING TIME
Activated Clotting Time: 138 s
Activated Clotting Time: 255 s

## 2024-09-27 LAB — APTT: aPTT: 25 s (ref 24–36)

## 2024-09-27 SURGERY — TRANSCAROTID ARTERY REVASCULARIZATION (TCAR)
Anesthesia: General | Laterality: Right

## 2024-09-27 MED ORDER — LATANOPROST 0.005 % OP SOLN
1.0000 [drp] | Freq: Every day | OPHTHALMIC | Status: DC
  Administered 2024-09-27: 22:00:00 1 [drp] via OPHTHALMIC
  Filled 2024-09-27: qty 2.5

## 2024-09-27 MED ORDER — ONDANSETRON HCL 4 MG/2ML IJ SOLN: 4.0000 mg | Freq: Four times a day (QID) | INTRAMUSCULAR | Status: DC | PRN

## 2024-09-27 MED ORDER — CLEVIDIPINE BUTYRATE 0.5 MG/ML IV EMUL
INTRAVENOUS | Status: AC
  Filled 2024-09-27: qty 50

## 2024-09-27 MED ORDER — NITROGLYCERIN 0.4 MG SL SUBL: 0.4000 mg | SUBLINGUAL_TABLET | SUBLINGUAL | Status: DC | PRN

## 2024-09-27 MED ORDER — AMLODIPINE BESYLATE 10 MG PO TABS
10.0000 mg | ORAL_TABLET | Freq: Every day | ORAL | Status: DC
  Administered 2024-09-28: 10 mg via ORAL
  Filled 2024-09-27: qty 1

## 2024-09-27 MED ORDER — CLEVIDIPINE BUTYRATE 0.5 MG/ML IV EMUL
INTRAVENOUS | Status: DC | PRN
  Administered 2024-09-27: 09:00:00 3 mg/h via INTRAVENOUS

## 2024-09-27 MED ORDER — SODIUM CHLORIDE 0.9 % IV SOLN: INTRAVENOUS | Status: DC | PRN

## 2024-09-27 MED ORDER — FENTANYL CITRATE (PF) 250 MCG/5ML IJ SOLN
INTRAMUSCULAR | Status: DC | PRN
  Administered 2024-09-27: 08:00:00 100 ug via INTRAVENOUS

## 2024-09-27 MED ORDER — ROCURONIUM BROMIDE 10 MG/ML (PF) SYRINGE
PREFILLED_SYRINGE | INTRAVENOUS | Status: AC
  Filled 2024-09-27: qty 10

## 2024-09-27 MED ORDER — PHENYLEPHRINE 80 MCG/ML (10ML) SYRINGE FOR IV PUSH (FOR BLOOD PRESSURE SUPPORT)
PREFILLED_SYRINGE | INTRAVENOUS | Status: DC | PRN
  Administered 2024-09-27: 09:00:00 240 ug via INTRAVENOUS

## 2024-09-27 MED ORDER — LABETALOL HCL 5 MG/ML IV SOLN
INTRAVENOUS | Status: DC | PRN
  Administered 2024-09-27 (×2): 20 mg via INTRAVENOUS

## 2024-09-27 MED ORDER — PROTAMINE SULFATE 10 MG/ML IV SOLN
INTRAVENOUS | Status: DC | PRN
  Administered 2024-09-27: 09:00:00 50 mg via INTRAVENOUS

## 2024-09-27 MED ORDER — INSULIN ASPART 100 UNIT/ML IJ SOLN: 0.0000 [IU] | INTRAMUSCULAR | Status: DC | PRN

## 2024-09-27 MED ORDER — SUGAMMADEX SODIUM 200 MG/2ML IV SOLN
INTRAVENOUS | Status: AC
  Filled 2024-09-27: qty 2

## 2024-09-27 MED ORDER — BISACODYL 10 MG RE SUPP: 10.0000 mg | Freq: Every day | RECTAL | Status: DC | PRN

## 2024-09-27 MED ORDER — DORZOLAMIDE HCL-TIMOLOL MAL 2-0.5 % OP SOLN
1.0000 [drp] | Freq: Two times a day (BID) | OPHTHALMIC | Status: DC
  Administered 2024-09-27 – 2024-09-28 (×2): 1 [drp] via OPHTHALMIC
  Filled 2024-09-27: qty 10

## 2024-09-27 MED ORDER — HEPARIN SODIUM (PORCINE) 1000 UNIT/ML IJ SOLN
INTRAMUSCULAR | Status: AC
  Filled 2024-09-27: qty 10

## 2024-09-27 MED ORDER — ACETAMINOPHEN 325 MG PO TABS: 325.0000 mg | ORAL_TABLET | ORAL | Status: DC | PRN

## 2024-09-27 MED ORDER — DOXAZOSIN MESYLATE 4 MG PO TABS
4.0000 mg | ORAL_TABLET | Freq: Every day | ORAL | Status: DC
  Administered 2024-09-27: 22:00:00 4 mg via ORAL
  Filled 2024-09-27: qty 1

## 2024-09-27 MED ORDER — ROCURONIUM BROMIDE 10 MG/ML (PF) SYRINGE
PREFILLED_SYRINGE | INTRAVENOUS | Status: DC | PRN
  Administered 2024-09-27: 08:00:00 50 mg via INTRAVENOUS
  Administered 2024-09-27: 09:00:00 10 mg via INTRAVENOUS

## 2024-09-27 MED ORDER — PROPOFOL 10 MG/ML IV BOLUS
INTRAVENOUS | Status: DC | PRN
  Administered 2024-09-27: 08:00:00 200 mg via INTRAVENOUS

## 2024-09-27 MED ORDER — CEFAZOLIN SODIUM-DEXTROSE 2-4 GM/100ML-% IV SOLN
2.0000 g | Freq: Three times a day (TID) | INTRAVENOUS | Status: AC
  Administered 2024-09-27 (×2): 2 g via INTRAVENOUS
  Filled 2024-09-27 (×2): qty 100

## 2024-09-27 MED ORDER — LIDOCAINE 2% (20 MG/ML) 5 ML SYRINGE
INTRAMUSCULAR | Status: DC | PRN
  Administered 2024-09-27: 08:00:00 60 mg via INTRAVENOUS

## 2024-09-27 MED ORDER — HEPARIN SODIUM (PORCINE) 1000 UNIT/ML IJ SOLN
INTRAMUSCULAR | Status: DC | PRN
  Administered 2024-09-27: 08:00:00 7000 [IU] via INTRAVENOUS

## 2024-09-27 MED ORDER — HEPARIN 6000 UNIT IRRIGATION SOLUTION
Status: DC | PRN
  Administered 2024-09-27: 08:00:00 1

## 2024-09-27 MED ORDER — HYDROCORTISONE SOD SUC (PF) 250 MG IJ SOLR
INTRAMUSCULAR | Status: AC
  Filled 2024-09-27: qty 250

## 2024-09-27 MED ORDER — PHENOL 1.4 % MT LIQD: 1.0000 | OROMUCOSAL | Status: DC | PRN

## 2024-09-27 MED ORDER — EZETIMIBE 10 MG PO TABS
10.0000 mg | ORAL_TABLET | Freq: Every day | ORAL | Status: DC
  Administered 2024-09-28: 10 mg via ORAL
  Filled 2024-09-27: qty 1

## 2024-09-27 MED ORDER — LABETALOL HCL 5 MG/ML IV SOLN: 10.0000 mg | INTRAVENOUS | Status: DC | PRN

## 2024-09-27 MED ORDER — OXYCODONE-ACETAMINOPHEN 5-325 MG PO TABS
1.0000 | ORAL_TABLET | Freq: Four times a day (QID) | ORAL | Status: DC | PRN
  Administered 2024-09-28: 1 via ORAL
  Filled 2024-09-27: qty 1

## 2024-09-27 MED ORDER — BRIMONIDINE TARTRATE 0.2 % OP SOLN
1.0000 [drp] | Freq: Two times a day (BID) | OPHTHALMIC | Status: DC
  Administered 2024-09-27 – 2024-09-28 (×2): 1 [drp] via OPHTHALMIC
  Filled 2024-09-27: qty 5

## 2024-09-27 MED ORDER — POLYETHYLENE GLYCOL 3350 17 G PO PACK: 17.0000 g | PACK | Freq: Every day | ORAL | Status: DC | PRN

## 2024-09-27 MED ORDER — SUGAMMADEX SODIUM 200 MG/2ML IV SOLN
INTRAVENOUS | Status: DC | PRN
  Administered 2024-09-27: 09:00:00 142.4 mg via INTRAVENOUS

## 2024-09-27 MED ORDER — HEMOSTATIC AGENTS (NO CHARGE) OPTIME
TOPICAL | Status: DC | PRN
  Administered 2024-09-27: 09:00:00 1 via TOPICAL

## 2024-09-27 MED ORDER — CLOPIDOGREL BISULFATE 75 MG PO TABS
75.0000 mg | ORAL_TABLET | Freq: Every day | ORAL | Status: DC
  Administered 2024-09-28: 75 mg via ORAL
  Filled 2024-09-27: qty 1

## 2024-09-27 MED ORDER — PROPOFOL 500 MG/50ML IV EMUL
INTRAVENOUS | Status: DC | PRN
  Administered 2024-09-27: 08:00:00 25 ug/kg/min via INTRAVENOUS

## 2024-09-27 MED ORDER — HYDROMORPHONE HCL 1 MG/ML IJ SOLN
0.5000 mg | INTRAMUSCULAR | Status: DC | PRN
  Administered 2024-09-27: 22:00:00 0.5 mg via INTRAVENOUS
  Filled 2024-09-27: qty 0.5

## 2024-09-27 MED ORDER — CHLORHEXIDINE GLUCONATE CLOTH 2 % EX PADS: 6.0000 | MEDICATED_PAD | Freq: Once | CUTANEOUS | Status: DC

## 2024-09-27 MED ORDER — INSULIN ASPART 100 UNIT/ML IJ SOLN
0.0000 [IU] | Freq: Three times a day (TID) | INTRAMUSCULAR | Status: DC
  Administered 2024-09-27 (×2): 2 [IU] via SUBCUTANEOUS
  Administered 2024-09-28: 1 [IU] via SUBCUTANEOUS
  Filled 2024-09-27 (×2): qty 2

## 2024-09-27 MED ORDER — DOCUSATE SODIUM 100 MG PO CAPS
100.0000 mg | ORAL_CAPSULE | Freq: Every day | ORAL | Status: DC
  Administered 2024-09-28: 100 mg via ORAL
  Filled 2024-09-27: qty 1

## 2024-09-27 MED ORDER — HYDRALAZINE HCL 20 MG/ML IJ SOLN: 5.0000 mg | INTRAMUSCULAR | Status: DC | PRN

## 2024-09-27 MED ORDER — ACETAMINOPHEN 650 MG RE SUPP: 325.0000 mg | RECTAL | Status: DC | PRN

## 2024-09-27 MED ORDER — HYDROCHLOROTHIAZIDE 12.5 MG PO TABS
12.5000 mg | ORAL_TABLET | Freq: Every day | ORAL | Status: DC
  Administered 2024-09-27 – 2024-09-28 (×2): 12.5 mg via ORAL
  Filled 2024-09-27 (×2): qty 1

## 2024-09-27 MED ORDER — ONDANSETRON HCL 4 MG/2ML IJ SOLN
INTRAMUSCULAR | Status: DC | PRN
  Administered 2024-09-27: 09:00:00 4 mg via INTRAVENOUS

## 2024-09-27 MED ORDER — ORAL CARE MOUTH RINSE: 15.0000 mL | Freq: Once | OROMUCOSAL | Status: AC

## 2024-09-27 MED ORDER — 0.9 % SODIUM CHLORIDE (POUR BTL) OPTIME
TOPICAL | Status: DC | PRN
  Administered 2024-09-27: 08:00:00 1000 mL

## 2024-09-27 MED ORDER — HYDRALAZINE HCL 20 MG/ML IJ SOLN
INTRAMUSCULAR | Status: AC
  Filled 2024-09-27: qty 1

## 2024-09-27 MED ORDER — ASPIRIN 81 MG PO CHEW
81.0000 mg | CHEWABLE_TABLET | Freq: Every day | ORAL | Status: DC
  Administered 2024-09-28: 81 mg via ORAL
  Filled 2024-09-27: qty 1

## 2024-09-27 MED ORDER — CHLORHEXIDINE GLUCONATE 0.12 % MT SOLN
15.0000 mL | Freq: Once | OROMUCOSAL | Status: AC
  Administered 2024-09-27: 07:00:00 15 mL via OROMUCOSAL
  Filled 2024-09-27: qty 15

## 2024-09-27 MED ORDER — PROPOFOL 10 MG/ML IV BOLUS
INTRAVENOUS | Status: AC
  Filled 2024-09-27: qty 20

## 2024-09-27 MED ORDER — PROTAMINE SULFATE 10 MG/ML IV SOLN
INTRAVENOUS | Status: AC
  Filled 2024-09-27: qty 5

## 2024-09-27 MED ORDER — PHENYLEPHRINE 80 MCG/ML (10ML) SYRINGE FOR IV PUSH (FOR BLOOD PRESSURE SUPPORT)
PREFILLED_SYRINGE | INTRAVENOUS | Status: AC
  Filled 2024-09-27: qty 10

## 2024-09-27 MED ORDER — LORATADINE 10 MG PO TABS: 10.0000 mg | ORAL_TABLET | Freq: Every day | ORAL | Status: DC | PRN

## 2024-09-27 MED ORDER — SODIUM CHLORIDE 0.9 % IV SOLN: 500.0000 mL | Freq: Once | INTRAVENOUS | Status: DC | PRN

## 2024-09-27 MED ORDER — IODIXANOL 320 MG/ML IV SOLN
INTRAVENOUS | Status: DC | PRN
  Administered 2024-09-27: 08:00:00 12 mL via INTRA_ARTERIAL

## 2024-09-27 MED ORDER — METOPROLOL TARTRATE 5 MG/5ML IV SOLN: 2.5000 mg | INTRAVENOUS | Status: DC | PRN

## 2024-09-27 MED ORDER — POTASSIUM CHLORIDE CRYS ER 20 MEQ PO TBCR: 40.0000 meq | EXTENDED_RELEASE_TABLET | Freq: Every day | ORAL | Status: DC | PRN

## 2024-09-27 MED ORDER — ONDANSETRON HCL 4 MG/2ML IJ SOLN
INTRAMUSCULAR | Status: AC
  Filled 2024-09-27: qty 2

## 2024-09-27 MED ORDER — ALBUTEROL SULFATE (2.5 MG/3ML) 0.083% IN NEBU: 2.5000 mg | INHALATION_SOLUTION | RESPIRATORY_TRACT | Status: DC | PRN

## 2024-09-27 MED ORDER — ADULT MULTIVITAMIN W/MINERALS CH
1.0000 | ORAL_TABLET | Freq: Every day | ORAL | Status: DC
  Administered 2024-09-28: 1 via ORAL
  Filled 2024-09-27: qty 1

## 2024-09-27 MED ORDER — PANTOPRAZOLE SODIUM 40 MG PO TBEC: 40.0000 mg | DELAYED_RELEASE_TABLET | Freq: Every day | ORAL | Status: DC | PRN

## 2024-09-27 MED ORDER — IRBESARTAN 150 MG PO TABS
150.0000 mg | ORAL_TABLET | Freq: Every day | ORAL | Status: DC
  Administered 2024-09-27 – 2024-09-28 (×2): 150 mg via ORAL
  Filled 2024-09-27 (×2): qty 1

## 2024-09-27 MED ORDER — LIDOCAINE 2% (20 MG/ML) 5 ML SYRINGE
INTRAMUSCULAR | Status: AC
  Filled 2024-09-27: qty 5

## 2024-09-27 MED ORDER — DIPHENHYDRAMINE HCL 50 MG/ML IJ SOLN
INTRAMUSCULAR | Status: AC
  Filled 2024-09-27: qty 1

## 2024-09-27 MED ORDER — CLEVIDIPINE BUTYRATE 0.5 MG/ML IV EMUL: 0.0000 mg/h | INTRAVENOUS | Status: DC

## 2024-09-27 MED ORDER — CEFAZOLIN SODIUM-DEXTROSE 2-4 GM/100ML-% IV SOLN
2.0000 g | INTRAVENOUS | Status: AC
  Administered 2024-09-27: 08:00:00 2 g via INTRAVENOUS
  Filled 2024-09-27: qty 100

## 2024-09-27 MED ORDER — PHENYLEPHRINE HCL-NACL 20-0.9 MG/250ML-% IV SOLN
INTRAVENOUS | Status: DC | PRN
  Administered 2024-09-27: 09:00:00 100 ug/min via INTRAVENOUS

## 2024-09-27 MED ORDER — LACTATED RINGERS IV SOLN: INTRAVENOUS | Status: DC

## 2024-09-27 MED ORDER — ATORVASTATIN CALCIUM 80 MG PO TABS
80.0000 mg | ORAL_TABLET | Freq: Every day | ORAL | Status: DC
  Administered 2024-09-27 – 2024-09-28 (×2): 80 mg via ORAL
  Filled 2024-09-27 (×2): qty 1

## 2024-09-27 MED ORDER — SODIUM CHLORIDE 0.9 % IV SOLN: INTRAVENOUS | Status: DC

## 2024-09-27 MED ORDER — FLUTICASONE PROPIONATE 50 MCG/ACT NA SUSP
1.0000 | Freq: Every day | NASAL | Status: DC
  Administered 2024-09-28: 1 via NASAL
  Filled 2024-09-27: qty 16

## 2024-09-27 MED ADMIN — Hydrocortisone Sodium Succinate PF For Inj 100 MG: 100 mg | INTRAVENOUS | @ 08:00:00 | NDC 00009001104

## 2024-09-27 MED ADMIN — Hydralazine HCl Inj 20 MG/ML: 5 mg | INTRAVENOUS | @ 11:00:00 | NDC 72572026501

## 2024-09-27 MED ADMIN — Diphenhydramine HCl Inj 50 MG/ML: 25 mg | INTRAVENOUS | @ 08:00:00 | NDC 00641037621

## 2024-09-27 MED FILL — Fentanyl Citrate Preservative Free (PF) Inj 100 MCG/2ML: INTRAMUSCULAR | Qty: 2 | Status: AC

## 2024-09-27 SURGICAL SUPPLY — 40 items
BAG BANDED W/RUBBER/TAPE 36X54 (MISCELLANEOUS) ×1 IMPLANT
BAG COUNTER SPONGE SURGICOUNT (BAG) ×1 IMPLANT
BALLOON STERLING RX 7X30X80 (BALLOONS) IMPLANT
CANISTER SUCTION 3000ML PPV (SUCTIONS) ×1 IMPLANT
CATH ROBINSON RED A/P 18FR (CATHETERS) IMPLANT
CLIP TI MEDIUM 6 (CLIP) ×1 IMPLANT
CLIP TI WIDE RED SMALL 6 (CLIP) ×1 IMPLANT
COVER DOME SNAP 22 D (MISCELLANEOUS) ×1 IMPLANT
COVER PROBE W GEL 5X96 (DRAPES) ×1 IMPLANT
DERMABOND ADVANCED .7 DNX12 (GAUZE/BANDAGES/DRESSINGS) ×1 IMPLANT
DRAPE FEMORAL ANGIO 80X135IN (DRAPES) ×1 IMPLANT
ELECTRODE REM PT RTRN 9FT ADLT (ELECTROSURGICAL) ×1 IMPLANT
GLOVE BIOGEL PI IND STRL 7.0 (GLOVE) ×1 IMPLANT
GOWN STRL REUS W/ TWL LRG LVL3 (GOWN DISPOSABLE) ×2 IMPLANT
GOWN STRL REUS W/ TWL XL LVL3 (GOWN DISPOSABLE) ×1 IMPLANT
GUIDEWIRE ENROUTE 0.014 (WIRE) ×1 IMPLANT
KIT BASIN OR (CUSTOM PROCEDURE TRAY) ×1 IMPLANT
KIT ENCORE 26 ADVANTAGE (KITS) ×1 IMPLANT
KIT INTRODUCER GALT 7 (INTRODUCER) ×1 IMPLANT
KIT TURNOVER KIT B (KITS) ×1 IMPLANT
NDL HYPO 25GX1X1/2 BEV (NEEDLE) IMPLANT
NEEDLE HYPO 25GX1X1/2 BEV (NEEDLE) IMPLANT
PACK CAROTID (CUSTOM PROCEDURE TRAY) ×1 IMPLANT
POSITIONER HEAD DONUT 9IN (MISCELLANEOUS) ×1 IMPLANT
POWDER SURGICEL 3.0 GRAM (HEMOSTASIS) IMPLANT
SET MICROPUNCTURE 5F STIFF (MISCELLANEOUS) ×1 IMPLANT
SOLN STERILE WATER BTL 1000 ML (IV SOLUTION) ×1 IMPLANT
STENT TRANSCAROTID SYS 10X40 (Permanent Stent) IMPLANT
SUT MNCRL AB 4-0 PS2 18 (SUTURE) ×1 IMPLANT
SUT PROLENE 6 0 BV (SUTURE) ×1 IMPLANT
SUT SILK 2 0 PERMA HAND 18 BK (SUTURE) ×1 IMPLANT
SUT SILK 3-0 18XBRD TIE 12 (SUTURE) IMPLANT
SUT VIC AB 2-0 CT1 TAPERPNT 27 (SUTURE) ×1 IMPLANT
SUT VIC AB 3-0 SH 27X BRD (SUTURE) ×1 IMPLANT
SYR 10ML LL (SYRINGE) ×3 IMPLANT
SYR 20ML LL LF (SYRINGE) ×1 IMPLANT
SYR CONTROL 10ML LL (SYRINGE) IMPLANT
SYSTEM TRANSCAROTID NEUROPRTCT (MISCELLANEOUS) ×1 IMPLANT
TOWEL GREEN STERILE (TOWEL DISPOSABLE) ×1 IMPLANT
WIRE BENTSON .035X145CM (WIRE) ×1 IMPLANT

## 2024-09-27 NOTE — Anesthesia Procedure Notes (Signed)
 Procedure Name: Intubation Date/Time: 09/27/2024 7:56 AM  Performed by: Harrold Macintosh, CRNAPre-anesthesia Checklist: Patient identified, Emergency Drugs available, Suction available and Patient being monitored Patient Re-evaluated:Patient Re-evaluated prior to induction Oxygen Delivery Method: Circle system utilized Preoxygenation: Pre-oxygenation with 100% oxygen Induction Type: IV induction Ventilation: Mask ventilation without difficulty Laryngoscope Size: Miller and 2 Grade View: Grade I Tube type: Oral Tube size: 7.0 mm Number of attempts: 1 Airway Equipment and Method: Stylet and Bite block Placement Confirmation: ETT inserted through vocal cords under direct vision, positive ETCO2 and breath sounds checked- equal and bilateral Secured at: 21 cm Tube secured with: Tape Dental Injury: Teeth and Oropharynx as per pre-operative assessment

## 2024-09-27 NOTE — Discharge Instructions (Signed)

## 2024-09-27 NOTE — Plan of Care (Signed)
  Problem: Education: Goal: Knowledge of General Education information will improve Description: Including pain rating scale, medication(s)/side effects and non-pharmacologic comfort measures Outcome: Progressing   Problem: Health Behavior/Discharge Planning: Goal: Ability to manage health-related needs will improve Outcome: Progressing   Problem: Clinical Measurements: Goal: Ability to maintain clinical measurements within normal limits will improve Outcome: Progressing Goal: Will remain free from infection Outcome: Progressing Goal: Diagnostic test results will improve Outcome: Progressing Goal: Respiratory complications will improve Outcome: Progressing Goal: Cardiovascular complication will be avoided Outcome: Progressing   Problem: Activity: Goal: Risk for activity intolerance will decrease Outcome: Progressing   Problem: Nutrition: Goal: Adequate nutrition will be maintained Outcome: Progressing   Problem: Coping: Goal: Level of anxiety will decrease Outcome: Progressing   Problem: Elimination: Goal: Will not experience complications related to bowel motility Outcome: Progressing Goal: Will not experience complications related to urinary retention Outcome: Progressing   Problem: Pain Managment: Goal: General experience of comfort will improve and/or be controlled Outcome: Progressing   Problem: Safety: Goal: Ability to remain free from injury will improve Outcome: Progressing   Problem: Skin Integrity: Goal: Risk for impaired skin integrity will decrease Outcome: Progressing   Problem: Education: Goal: Knowledge of discharge needs will improve Outcome: Progressing   Problem: Clinical Measurements: Goal: Postoperative complications will be avoided or minimized Outcome: Progressing   Problem: Respiratory: Goal: Will achieve and/or maintain a regular respiratory rate, without signs or symptoms of dyspnea Outcome: Progressing   Problem: Skin  Integrity: Goal: Demonstration of wound healing without infection will improve Outcome: Progressing   Problem: Education: Goal: Ability to describe self-care measures that may prevent or decrease complications (Diabetes Survival Skills Education) will improve Outcome: Progressing Goal: Individualized Educational Video(s) Outcome: Progressing   Problem: Coping: Goal: Ability to adjust to condition or change in health will improve Outcome: Progressing   Problem: Fluid Volume: Goal: Ability to maintain a balanced intake and output will improve Outcome: Progressing   Problem: Health Behavior/Discharge Planning: Goal: Ability to identify and utilize available resources and services will improve Outcome: Progressing Goal: Ability to manage health-related needs will improve Outcome: Progressing   Problem: Metabolic: Goal: Ability to maintain appropriate glucose levels will improve Outcome: Progressing   Problem: Nutritional: Goal: Maintenance of adequate nutrition will improve Outcome: Progressing Goal: Progress toward achieving an optimal weight will improve Outcome: Progressing   Problem: Skin Integrity: Goal: Risk for impaired skin integrity will decrease Outcome: Progressing   Problem: Tissue Perfusion: Goal: Adequacy of tissue perfusion will improve Outcome: Progressing

## 2024-09-27 NOTE — Op Note (Signed)
 OPERATIVE NOTE  PROCEDURE:   Right carotid stent placement (TCAR) Flow reversal embolic neuro-protection Ultrasound-guided left femoral vein access  PRE-OPERATIVE DIAGNOSIS: asymptomatic carotid artery stenosis  POST-OPERATIVE DIAGNOSIS: same as above   SURGEON: Norman GORMAN Serve MD  ASSISTANT(S): Lucie Apt, PA  Given the complexity of the case,  the assistant was necessary in order to expedient the procedure and safely perform the technical aspects of the operation.  The assistant provided traction and countertraction while inserting the TCAR sheath. The assistant also played a critical role in pinning wires while advancing balloons and the stent across the critical stenosis. These skills could not have been adequately performed by a scrub tech assistant.    ANESTHESIA: general  ESTIMATED BLOOD LOSS: 15  FINDING(S): Severe stenosis of the proximal stent edge in the right common carotid.  Approximate 30% stenosis of the distal stented. The new stent was placed in the common carotid artery covering the proximal stent edge.  There was wide patency and brisk flow through the stent on completion.  We did not perform any intervention of the in-stent stenosis at the distal edge as this was less than 50% based on the CT scan.  SPECIMEN(S):  none  INDICATIONS:   Kara Kirby is a 78 y.o. female who presents with right asymptomatic carotid stenosis 90%.  She has had a previous right carotid and neurectom that developed restenosis and underwent transfemoral right carotid stenting with Dr. Harvey in 2017.  She now has a 90% stenosis at the proximal edge of the stent in the common carotid artery.  We discussed that a redo endarterectomy would be high risk although she is a candidate for TCAR. I discussed the procedural details of both carotid endarterectomy and TCAR with the patient and recommended TCAR based on his anatomy and medical comorbidity's.  The patient is aware that the  risks of TCAR include but are not limited to: bleeding, infection, stroke, myocardial infarction, death, cranial nerve injuries both temporary and permanent, neck hematoma, possible airway compromise, labile blood pressure post-operatively, cerebral hyperperfusion syndrome, and possible need for additional interventions in the future. The patient is aware of the risks and agrees to proceed forward with the procedure.  DESCRIPTION: The patient was transferred to the operating room and positioned supine on the operating room table. Anesthesia was induced. The neck and groins were prepped and draped in standard fashion. A surgical timeout was performed confirming correct patient, procedure, and operative location.  The left common femoral vein (CFV) was accessed under ultrasound guidance, using standard Seldinger and micropuncture access technique. Permanent recorded image(s) was/were saved in the patient's medical record. The Venous Return Sheath was advanced into the CFV over the 0.035 wire provided. Blood was aspirated from the flow line followed by flushing of the Venous Sheath with heparinized saline. The Venous Sheath was secured to the patients skin with suture to maintain optimal position in the vessel.   Using intraoperative ultrasound the course of the common carotid artery was mapped on the skin. A longitudinal 3-4 cm incision was made between the sternal and clavicular heads of the sternocleidomastoid muscle. The omohyoid was transected. Following longitudinal division of the carotid sheath the jugular vein was partially dissected and retracted laterally. Once 3 cm of common carotid artery (CCA) were isolated, umbilical tape was placed around the proximal 1/3 of the CCA under direct vision. A vessel loop was also placed around the artery just proximal to the umbilical tape in Pots fashion. A 5-0  polypropylene suture was pre-placed in the anterior wall of the CCA, in a U stitch configuration,  close to the clavicle to facilitate hemostasis upon removal of the arterial sheath at completion of the TCAR procedure.  Heparin  was given to obtain a therapeutic activated clotting time >250 seconds prior to arterial access. A 4-French non-stiffened ENHANCE Transcarotid / Peripheral Access set was used, puncturing the artery with the 21G needle through the pre-placed U stitch while holding gentle traction on the umbilical tape to stabilize and centralize the CCA within the incision. Careful attention was paid to the change in CCA shape when using the umbilical tape to control or lift the artery. The micropuncture wire was then advanced 3-4 cm into the CCA and, the 21G needle was removed. The micropuncture sheath was advanced 2-3 cm into the CCA and the wire and dilator were removed. Pulsatile backflow indicated correct positioning. The provided 0.035 J-tipped guidewire was inserted as close as possible to the bifurcation without engaging the lesion. After micropuncture sheath removal, the Transcarotid Arterial Sheath was advanced to the 2.5cm marker and the 0.035 wire and dilator were then removed. Arterial Sheath position was assessed under fluoroscopy in two projections to ensure that the sheath tip was oriented coaxially in the CCA. The Arterial Sheath was sutured to the patient with gentle forward tension. Blood was slowly aspirated followed by flushing with heparinized saline. No ingress of air bubbles through the passive hemostatic valve was observed. The stopcocks were closed. Traction applied to the CCA previously to facilitate access was gently released.   The Flow Controller was connected to the Transcarotid Arterial Sheath, prepared by passively allowing a column of arterial blood to fill the line and connected to the Venous Return Sheath. Passive flow reversal was confirmed with a saline bolus deliver into the venous flow line on the Low flow setting of the Flow Controller. The flow setting  was switched to High and the CCA inflow was occluded proximal to the arteriotomy with the vessel loop to achieve active flow reversal. To confirm active flow reversal, a saline bolus was delivered into the venous flow line on the High flow setting of the Flow Controller. Angiograms were performed with slow injections of a small amount of contrast filling just past the lesion to minimize antegrade transmission of micro-bubbles.  Prior to lesion manipulation, heart rate (70bpm) and systolic BP (90-110 MAP) were managed upwards to optimize flow reversal and procedural neuroprotection. The lesion was crossed with an 0.014 ENROUTE guidewire and pre-dilation of the lesion was performed with a 7mm x 30mm rapid exchange 0.014 compatible balloon catheter to 8 atmospheres. Stenting was performed with an 10mm x 40mm ENROUTE Transcarotid stent, sized appropriately to the CCA. AP and lateral angiograms (gentle contrast injections) were performed to confirm stent placement and arterial wall stent apposition.  At [facility] case completion, antegrade flow was restored by releasing the vessel loop on the CCA then closing the NPS stopcocks to the flow lines. The Transcarotid Arterial Sheath was removed and the pre-closure suture was tied. Heparin  was reversed, the wound was copiously irrigated and hemostasis achieved. The heads of the SCM was re-approximated with a 2-0 Vicryl, the platysma was closed with a 3-0 Vicryl and the skin with 4-0 Monocryl.   The Venous Return Sheath was removed and hemostasis was achieved with brief manual compression.  The patient tolerated the procedure well and was extubated on the table. The patient was moving all four extremities to command prior to transfer to the  recovery room.    COMPLICATIONS: none apparent  CONDITION: stable  Norman GORMAN Serve MD [facility] Phone Number: [phone] 09/27/2024 9:54 AM

## 2024-09-27 NOTE — Transfer of Care (Signed)
 Immediate Anesthesia Transfer of Care Note  Patient: Kara Kirby  Procedure(s) Performed: LERETHA ARTERY REVASCULARIZATION (TCAR) (Right)  Patient Location: PACU  Anesthesia Type:General  Level of Consciousness: awake, alert , and oriented  Airway & Oxygen Therapy: Patient Spontanous Breathing  Post-op Assessment: Report given to RN, Post -op Vital signs reviewed and stable, Patient moving all extremities X 4, and Patient able to stick tongue midline  Post vital signs: Reviewed and stable  Last Vitals:  Vitals Value Taken Time  BP 155/65 09/27/24 09:20  Temp 98.2   Pulse 66 09/27/24 09:24  Resp 13 09/27/24 09:24  SpO2 93 % 09/27/24 09:24  Vitals shown include unfiled device data.  Last Pain:  Vitals:   09/27/24 0600  PainSc: 0-No pain      Patients Stated Pain Goal: 0 (09/27/24 0600)  Complications: No notable events documented.

## 2024-09-27 NOTE — Anesthesia Procedure Notes (Addendum)
 Arterial Line Insertion Start/End12/18/2025 6:45 AM, 09/27/2024 6:50 AM Performed by: Harrold Macintosh, CRNA, CRNA  Patient location: Pre-op. Preanesthetic checklist: patient identified, IV checked, site marked, risks and benefits discussed, surgical consent, monitors and equipment checked, pre-op evaluation, timeout performed and anesthesia consent Lidocaine  1% used for infiltration Right, radial was placed Catheter size: 20 G Hand hygiene performed  and maximum sterile barriers used  Allen's test indicative of satisfactory collateral circulation Attempts: 1 Procedure performed using ultrasound to evaluate access site. Ultrasound Notes:relevant anatomy identified, ultrasound used to visualize needle entry and vessel patent under ultrasound. Following insertion, Biopatch and dressing applied. Post procedure assessment: normal and unchanged  Patient tolerated the procedure well with no immediate complications.

## 2024-09-27 NOTE — Interval H&P Note (Signed)
 History and Physical Interval Note:  09/27/2024 7:28 AM  Kara Kirby  has presented today for surgery, with the diagnosis of BL Carotid stenosis.  The various methods of treatment have been discussed with the patient and family. After consideration of risks, benefits and other options for treatment, the patient has consented to  Procedures: TRANSCAROTID ARTERY REVASCULARIZATION (TCAR) (Right) as a surgical intervention.  The patient's history has been reviewed, patient examined, no change in status, stable for surgery.  I have reviewed the patient's chart and labs.  Questions were answered to the patient's satisfaction.     Norman GORMAN Serve

## 2024-09-27 NOTE — Progress Notes (Signed)
°  Day of Surgery Note    Subjective:  resting comfortably in recovery.  RN reports small hematoma left groin.     Vitals:   09/27/24 0930 09/27/24 0945  BP: (!) 149/66 (!) 163/65  Pulse: 65 63  Resp: 10 (!) 8  Temp:    SpO2: 96% 100%    Incisions:   right neck incision is clean and dry.  Left groin with small hematoma Neuro:  moving all extremities equally; tongue is midline Lungs:  non labored Abdomen:  soft   Assessment/Plan:  This is a 78 y.o. female who is s/p  Right TCAR  -pt neuro in tact  -small hematoma left groin from venous stick.  RN holding addition 5-10 minutes of pressure -pt requiring Cleviprex  for blood pressure-work on weaning this.  May need ICU if cannot wean.   -continue asa/statin/plavix . -SSI for DM -Metformin  and Jardiance  on hold due to surgery/contrast.   Lucie Apt, PA-C 09/27/2024 10:07 AM [phone]

## 2024-09-28 ENCOUNTER — Other Ambulatory Visit (HOSPITAL_COMMUNITY): Payer: Self-pay

## 2024-09-28 ENCOUNTER — Ambulatory Visit

## 2024-09-28 ENCOUNTER — Encounter (HOSPITAL_COMMUNITY): Payer: Self-pay | Admitting: Vascular Surgery

## 2024-09-28 LAB — CBC
HCT: 35.1 % — ABNORMAL LOW (ref 36.0–46.0)
Hemoglobin: 11.4 g/dL — ABNORMAL LOW (ref 12.0–15.0)
MCH: 30.6 pg (ref 26.0–34.0)
MCHC: 32.5 g/dL (ref 30.0–36.0)
MCV: 94.1 fL (ref 80.0–100.0)
Platelets: 216 K/uL (ref 150–400)
RBC: 3.73 MIL/uL — ABNORMAL LOW (ref 3.87–5.11)
RDW: 14 % (ref 11.5–15.5)
WBC: 7.2 K/uL (ref 4.0–10.5)
nRBC: 0 % (ref 0.0–0.2)

## 2024-09-28 LAB — GLUCOSE, CAPILLARY: Glucose-Capillary: 131 mg/dL — ABNORMAL HIGH (ref 70–99)

## 2024-09-28 LAB — LIPID PANEL
Cholesterol: 91 mg/dL (ref 0–200)
HDL: 32 mg/dL — ABNORMAL LOW
LDL Cholesterol: 42 mg/dL (ref 0–99)
Total CHOL/HDL Ratio: 2.8 ratio
Triglycerides: 84 mg/dL
VLDL: 17 mg/dL (ref 0–40)

## 2024-09-28 LAB — BASIC METABOLIC PANEL WITH GFR
Anion gap: 10 (ref 5–15)
BUN: 22 mg/dL (ref 8–23)
CO2: 24 mmol/L (ref 22–32)
Calcium: 8.1 mg/dL — ABNORMAL LOW (ref 8.9–10.3)
Chloride: 106 mmol/L (ref 98–111)
Creatinine, Ser: 1.17 mg/dL — ABNORMAL HIGH (ref 0.44–1.00)
GFR, Estimated: 47 mL/min — ABNORMAL LOW
Glucose, Bld: 128 mg/dL — ABNORMAL HIGH (ref 70–99)
Potassium: 3.5 mmol/L (ref 3.5–5.1)
Sodium: 140 mmol/L (ref 135–145)

## 2024-09-28 MED ORDER — TRAMADOL HCL 50 MG PO TABS
50.0000 mg | ORAL_TABLET | Freq: Four times a day (QID) | ORAL | 0 refills | Status: AC | PRN
  Filled 2024-09-28: qty 12, 3d supply, fill #0

## 2024-09-28 NOTE — Discharge Summary (Incomplete)
 " Carotid Discharge Summary     Kara Kirby 23-Feb-1946 78 y.o. female  [id number]  Admission Date: 09/27/2024  Discharge Date: 09/28/2024  Physician: Dr. Norman Serve  Admission Diagnosis: Bilateral carotid artery stenosis [I65.23] Asymptomatic carotid artery stenosis without infarction, right [I65.21]  Discharge Day services:   None  Hospital Course:  The patient was admitted to the hospital and taken to the operating room on 09/27/2024 and underwent right transcarotid artery revascularization with flow reversal. The pt tolerated the procedure well and was transported to the PACU in good condition.  By POD 1, the pt neuro status ***  The remainder of the hospital course consisted of increasing mobilization and increasing intake of solids without difficulty.  She remained stable for discharge home. ***  Recent Labs    09/27/24 0616 09/28/24 0435  NA 139 140  K 3.5 3.5  CL 102 106  CO2 26 24  GLUCOSE 159* 128*  BUN 19 22  CALCIUM  9.6 8.1*   Recent Labs    09/27/24 0616 09/28/24 0435  WBC 6.1 7.2  HGB 14.0 11.4*  HCT 42.0 35.1*  PLT 249 216   Recent Labs    09/27/24 0616  INR 0.9     Discharge Instructions     CAROTID Sugery: Call MD for difficulty swallowing or speaking; weakness in arms or legs that is a new symtom; severe headache.  If you have increased swelling in the neck and/or  are having difficulty breathing, CALL 911   Complete by: As directed    Call MD for:  redness, tenderness, or signs of infection (pain, swelling, bleeding, redness, odor or green/yellow discharge around incision site)   Complete by: As directed    Call MD for:  severe or increased pain, loss or decreased feeling  in affected limb(s)   Complete by: As directed    Call MD for:  temperature >100.5   Complete by: As directed    Discharge wound care:   Complete by: As directed    Keep incision dry for 24 hours. You can then wash with mild soap and water , pat  dry. Do not soak in bathtub, pool, etc   Driving Restrictions   Complete by: As directed    No driving for 2 weeks or while taking narcotic pain medication   Increase activity slowly   Complete by: As directed    Walk with assistance use walker or cane as needed   Lifting restrictions   Complete by: As directed    No lifting for 3 weeks   Resume previous diet   Complete by: As directed        Discharge Diagnosis:  Bilateral carotid artery stenosis [I65.23] Asymptomatic carotid artery stenosis without infarction, right [I65.21]  Secondary Diagnosis: Patient Active Problem List   Diagnosis Date Noted   Asymptomatic carotid artery stenosis without infarction, right 09/27/2024   Ejection murmur 08/22/2024   Pelvic floor dysfunction in female 08/22/2024   Fecal smearing 08/22/2024   Chronic kidney disease, stage 3a (HCC) 07/13/2024   Urinary frequency 05/23/2024   Chronic pain of both shoulders 05/06/2022   Aortic atherosclerosis 09/08/2020   Primary open angle glaucoma (POAG) of left eye, severe stage 09/07/2019   Rectal bleeding 06/21/2018   Primary open angle glaucoma (POAG) of right eye, mild stage 04/09/2018   Glaucoma 11/07/2017   Nuclear age-related cataract, both eyes 01/25/2017   Diabetes mellitus without complication (HCC) 01/25/2017   Ischemic optic neuritis of left eye 09/23/2016  Carotid stenosis 10/17/2015   Insomnia 06/03/2014   Atherosclerotic peripheral vascular disease with intermittent claudication 02/01/2014   Aftercare following surgery of the circulatory system, NEC 05/23/2013   Type 2 diabetes mellitus with mild nonproliferative diabetic retinopathy without macular edema, unspecified eye (HCC) 04/19/2013   H/O acute sinusitis 03/02/2013   HTN (hypertension) 02/19/2013   Diabetes (HCC) 02/19/2013   Hyperlipidemia associated with type 2 diabetes mellitus (HCC) 02/19/2013   Stroke (HCC) 02/18/2013   Occlusion and stenosis of carotid artery without  mention of cerebral infarction 01/10/2013   Combined forms of age-related cataract 03/30/2012   Diabetes mellitus, type 2 (HCC) 10/28/2011   Branch retinal vein occlusion with macular edema of left eye (HCC) 07/22/2011   Macular edema 07/22/2011   Past Medical History:  Diagnosis Date   Allergy    Arthritis    Left shoulder   Carotid artery occlusion    CVA (cerebral vascular accident) (HCC) 02-19-13   DM (diabetes mellitus) (HCC)    Dyslipidemia    GERD (gastroesophageal reflux disease)    intermittent Sx   Hematuria    Hip arthritis 2003   Left   HTN (hypertension)    Hyperlipidemia    Renal insufficiency, mild    Sleep related leg cramps     Allergies as of 09/28/2024       Reactions   Other Hives   IV DYE   Iodinated Contrast Media Hives   Pre-medicate with benadryl  and prednisone    Zestril  [lisinopril ] Swelling   Angioedema of tongue July 2017        Medication List     PAUSE taking these medications    metFORMIN  500 MG tablet Wait to take this until: September 30, 2024 Commonly known as: GLUCOPHAGE  TAKE 2 TABLETS TWICE DAILY       TAKE these medications    acetaminophen  500 MG tablet Commonly known as: TYLENOL  Take 500-1,000 mg by mouth 2 (two) times daily as needed for mild pain.   amLODipine  10 MG tablet Commonly known as: NORVASC  TAKE 1 TABLET EVERY DAY   aspirin  81 MG tablet Take 81 mg by mouth daily.   atorvastatin  80 MG tablet Commonly known as: LIPITOR  TAKE 1 TABLET EVERY DAY   brimonidine  0.2 % ophthalmic solution Commonly known as: ALPHAGAN  Place 1 drop into both eyes in the morning and at bedtime.   clopidogrel  75 MG tablet Commonly known as: PLAVIX  TAKE 1 TABLET (75 MG TOTAL) BY MOUTH DAILY.   dorzolamide -timolol  2-0.5 % ophthalmic solution Commonly known as: COSOPT  Place 1 drop into both eyes 2 times daily.   doxazosin  4 MG tablet Commonly known as: CARDURA  TAKE 1 TABLET AT BEDTIME   empagliflozin  10 MG Tabs  tablet Commonly known as: Jardiance  TAKE 1 TABLET EVERY DAY BEFORE BREAKFAST   ezetimibe  10 MG tablet Commonly known as: Zetia  Take 1 tablet (10 mg total) by mouth daily.   fexofenadine  60 MG tablet Commonly known as: ALLEGRA  Take 1 tablet (60 mg total) by mouth 2 (two) times daily. What changed:  when to take this reasons to take this   fluticasone  50 MCG/ACT nasal spray Commonly known as: FLONASE  USE 2 SPRAYS IN EACH NOSTRIL ONE TIME DAILY   hydrochlorothiazide  12.5 MG tablet Commonly known as: HYDRODIURIL  Take 12.5 mg by mouth daily.   latanoprost  0.005 % ophthalmic solution Commonly known as: XALATAN  Place 1 drop into both eyes at bedtime.   Multivitamin Women 50+ Tabs Take 1 tablet by mouth daily.   nitroGLYCERIN   0.4 MG SL tablet Commonly known as: NITROSTAT  Place 1 tablet (0.4 mg total) under the tongue every 5 (five) minutes as needed for chest pain.   olmesartan  20 MG tablet Commonly known as: BENICAR  Take 1 tablet (20 mg total) by mouth daily.   OneTouch Ultra test strip Generic drug: glucose blood USE 1 STRIP TO CHECK GLUCOSE ONCE DAILY   pantoprazole  40 MG tablet Commonly known as: PROTONIX  TAKE 1 TABLET EVERY DAY AS NEEDED AS DIRECTED (DISCONTINUE FAMOTIDINE )   traMADol  50 MG tablet Commonly known as: Ultram  Take 1 tablet (50 mg total) by mouth every 6 (six) hours as needed for severe pain (pain score 7-10).   Ventolin  HFA 108 (90 Base) MCG/ACT inhaler Generic drug: albuterol  INHALE 2 PUFFS INTO THE LUNGS EVERY 4 (FOUR) HOURS AS NEEDED.               Discharge Care Instructions  (From admission, onward)           Start     Ordered   09/28/24 0000  Discharge wound care:       Comments: Keep incision dry for 24 hours. You can then wash with mild soap and water , pat dry. Do not soak in bathtub, pool, etc   09/28/24 0947             Discharge Instructions:   [facility] Discharge Instructions  Carotid Endarterectomy (CEA)  Please refer to the following instructions for your post-procedure care. Your surgeon or physician assistant will discuss any changes with you.  Activity  You are encouraged to walk as much as you can. You can slowly return to normal activities but must avoid strenuous activity and heavy lifting until your doctor tell you it's OK. Avoid activities such as vacuuming or swinging a golf club. You can drive after one week if you are comfortable and you are no longer taking prescription pain medications. It is normal to feel tired for serval weeks after your surgery. It is also normal to have difficulty with sleep habits, eating, and bowel movements after surgery. These will go away with time.  Bathing/Showering  You may shower after you come home. Do not soak in a bathtub, hot tub, or swim until the incision heals completely.  Incision Care  Shower every day. Clean your incision with mild soap and water . Pat the area dry with a clean towel. You do not need a bandage unless otherwise instructed. Do not apply any ointments or creams to your incision. You may have skin glue on your incision. Do not peel it off. It will come off on its own in about one week. Your incision may feel thickened and raised for several weeks after your surgery. This is normal and the skin will soften over time. For Men Only: It's OK to shave around the incision but do not shave the incision itself for 2 weeks. It is common to have numbness under your chin that could last for several months.  Diet  Resume your normal diet. There are no special food restrictions following this procedure. A low fat/low cholesterol diet is recommended for all patients with vascular disease. In order to heal from your surgery, it is CRITICAL to get adequate nutrition. Your body requires vitamins, minerals, and protein. Vegetables are the best source of vitamins and minerals. Vegetables also provide the perfect balance of  protein. Processed food has little nutritional value, so try to avoid this.  Medications  Resume taking all  of your medications unless your doctor or physician assistant tells you not to.  If your incision is causing pain, you may take over-the- counter pain relievers such as acetaminophen  (Tylenol ). If you were prescribed a stronger pain medication, please be aware these medications can cause nausea and constipation.  Prevent nausea by taking the medication with a snack or meal. Avoid constipation by drinking plenty of fluids and eating foods with a high amount of fiber, such as fruits, vegetables, and grains. Do not take Tylenol  if you are taking prescription pain medications.  Follow Up  Our office will schedule a follow up appointment 2-3 weeks following discharge.  Please call us  immediately for any of the following conditions  Increased pain, redness, drainage (pus) from your incision site. Fever of 101 degrees or higher. If you should develop stroke (slurred speech, difficulty swallowing, weakness on one side of your body, loss of vision) you should call 911 and go to the nearest emergency room.  Reduce your risk of vascular disease:  Stop smoking. If you would like help call [facility] at 1-800-QUIT-NOW (463 776 9859) or [place] at [phone]. Manage your cholesterol Maintain a desired weight Control your diabetes Keep your blood pressure down  If you have any questions, please call the office at [phone].  Prescriptions given: Tramadol  50 #12 No Refill  Disposition: Home  Patient's condition: is Good  Follow up: 1. Dr. Pearline in 1 month with carotid duplex   Charlayne Vultaggio PA-C [facility] [phone]   --- For [facility] Registry use ---   Modified Rankin score at D/C (0-6): 0  IV medication needed for:  1. Hypertension: Yes 2. Hypotension: No  Post-op Complications: No  1. Post-op CVA or TIA: No  If yes: Event classification  (right eye, left eye, right cortical, left cortical, verterobasilar, other): n/a  If yes: Timing of event (intra-op, <6 hrs post-op, >=6 hrs post-op, unknown): n/a  2. CN injury: No  If yes: CN not injuried   3. Myocardial infarction: No  If yes: Dx by (EKG or clinical, Troponin): n/a  4.  CHF: No  5.  Dysrhythmia (new): No  6. Wound infection: No  7. Reperfusion symptoms: No  8. Return to OR: No  If yes: return to OR for (bleeding, neurologic, other CEA incision, other): n/a  Discharge medications: Statin use:  Yes ASA use:  Yes   Beta blocker use:  No ACE-Inhibitor use:  No  ARB use:  Yes CCB use: Yes P2Y12 Antagonist use: Yes, GALERIUS.GANT ] Plavix , [ ]  Plasugrel, [ ]  Ticlopinine, [ ]  Ticagrelor, [ ]  Other, [ ]  No for medical reason, [ ]  Non-compliant, [ ]  Not-indicated Anti-coagulant use:  No, [ ]  Warfarin, [ ]  Rivaroxaban, [ ]  Dabigatran,   "

## 2024-09-28 NOTE — Anesthesia Postprocedure Evaluation (Signed)
"   Anesthesia Post Note  Patient: Kara Kirby  Procedure(s) Performed: LERETHA ARTERY REVASCULARIZATION (TCAR) (Right)     Patient location during evaluation: PACU Anesthesia Type: General Level of consciousness: awake Pain management: pain level controlled Vital Signs Assessment: post-procedure vital signs reviewed and stable Respiratory status: spontaneous breathing, nonlabored ventilation and respiratory function stable Cardiovascular status: blood pressure returned to baseline and stable Postop Assessment: no apparent nausea or vomiting Anesthetic complications: no   No notable events documented.  Last Vitals:  Vitals:   09/28/24 0321 09/28/24 0839  BP: (!) 113/49 129/60  Pulse: 96 65  Resp: 16 19  Temp: 36.8 C 36.8 C  SpO2: 95% 99%    Last Pain:  Vitals:   09/28/24 0839  TempSrc: Oral  PainSc:                  [name] [name]      "

## 2024-09-28 NOTE — Progress Notes (Addendum)
" °  Progress Note    09/28/2024 7:59 AM 1 Day Post-Op  Subjective:  a little incisional soreness but otherwise says she had a good night. Tolerating diet. Has ambulated in room. Denies any trouble talking or swallowing. Throat a little scratchy   Vitals:   09/27/24 2322 09/28/24 0321  BP: 115/60 (!) 113/49  Pulse: 67 96  Resp: 19 16  Temp: 98.3 F (36.8 C) 98.2 F (36.8 C)  SpO2: 95% 95%   Physical Exam: Cardiac:  regular Lungs:  non labored Incisions:  right neck incision is clean, dry and intact without swelling or hematoma. Left groin access site soft, no hematoma Extremities:  moving all extremities without deficits Abdomen:  soft Neurologic: alert and oriented. Speech coherent. Smile symmetric  CBC    Component Value Date/Time   WBC 7.2 09/28/2024 0435   RBC 3.73 (L) 09/28/2024 0435   HGB 11.4 (L) 09/28/2024 0435   HGB 13.6 01/14/2020 1055   HCT 35.1 (L) 09/28/2024 0435   HCT 41.0 01/14/2020 1055   PLT 216 09/28/2024 0435   PLT 243 01/14/2020 1055   MCV 94.1 09/28/2024 0435   MCV 93 01/14/2020 1055   MCH 30.6 09/28/2024 0435   MCHC 32.5 09/28/2024 0435   RDW 14.0 09/28/2024 0435   RDW 13.1 01/14/2020 1055   LYMPHSABS 2.4 01/14/2020 1055   MONOABS 0.7 02/18/2013 1655   EOSABS 0.2 01/14/2020 1055   BASOSABS 0.0 01/14/2020 1055    BMET    Component Value Date/Time   NA 140 09/28/2024 0435   NA 142 07/02/2024 0948   K 3.5 09/28/2024 0435   CL 106 09/28/2024 0435   CO2 24 09/28/2024 0435   GLUCOSE 128 (H) 09/28/2024 0435   BUN 22 09/28/2024 0435   BUN 16 07/02/2024 0948   CREATININE 1.17 (H) 09/28/2024 0435   CREATININE 0.81 09/10/2015 1342   CALCIUM  8.1 (L) 09/28/2024 0435   GFRNONAA 47 (L) 09/28/2024 0435   GFRAA 64 12/01/2020 0926    INR    Component Value Date/Time   INR 0.9 09/27/2024 0616     Intake/Output Summary (Last 24 hours) at 09/28/2024 0759 Last data filed at 09/27/2024 1538 Gross per 24 hour  Intake 1060.8 ml  Output 10 ml   Net 1050.8 ml     Assessment/Plan:  78 y.o. female is s/p Right TCAR 1 Day Post-Op   Neurologically intact Right neck incision c/d/I without swelling or hematoma Left CF vein access site soft, no hematoma Hemodynamically stable. Blood pressures have been good off cleviprex  Aspirin , Statin, Plavix  She is stable for discharge home today She will follow up in 1 month with carotid duplex   Teretha Damme, PA-C [facility] [phone] 09/28/2024 7:59 AM  VASCULAR STAFF ADDENDUM: I have independently interviewed and examined the patient. I agree with the above.  Plan for discharge today, 1 month follow up  Norman GORMAN Serve MD [facility] Phone Number: [phone] 09/28/2024 8:31 AM  "

## 2024-10-01 ENCOUNTER — Telehealth: Payer: Self-pay | Admitting: *Deleted

## 2024-10-01 ENCOUNTER — Other Ambulatory Visit: Payer: Self-pay

## 2024-10-01 DIAGNOSIS — I6529 Occlusion and stenosis of unspecified carotid artery: Secondary | ICD-10-CM

## 2024-10-01 LAB — TYPE AND SCREEN
ABO/RH(D): A POS
Antibody Screen: POSITIVE
Donor AG Type: NEGATIVE
Donor AG Type: NEGATIVE
Unit division: 0
Unit division: 0

## 2024-10-01 LAB — BPAM RBC
Blood Product Expiration Date: 202601142359
Blood Product Expiration Date: 202601142359
Unit Type and Rh: 6200
Unit Type and Rh: 6200

## 2024-10-01 NOTE — Transitions of Care (Post Inpatient/ED Visit) (Signed)
 "  10/01/2024  Name: Kara Kirby MRN: [id number] DOB: Jun 15, 1946  Today's TOC FU Call Status: Today's TOC FU Call Status:: Successful TOC FU Call Completed TOC FU Call Complete Date: 10/01/24  Patient's Name and Date of Birth confirmed. Name, DOB  Transition Care Management Follow-up Telephone Call Date of Discharge: 09/28/24 Discharge Facility: [facility] Type of Discharge: Inpatient Admission Primary Inpatient Discharge Diagnosis:: Asymptomatic carotid artery stenosis without infarction, right How have you been since you were released from the hospital?: Better (eating, drinking well, no issues with bowel/ bladder) Any questions or concerns?: No  Items Reviewed: Did you receive and understand the discharge instructions provided?: Yes Medications obtained,verified, and reconciled?: Yes (Medications Reviewed) Any new allergies since your discharge?: No Dietary orders reviewed?: Yes Type of Diet Ordered:: heart healthy, carbohydrate modified Do you have support at home?: Yes People in Home [RPT]: alone Name of Support/Comfort Primary Source: pt states she has plenty of people I can call if needed Reviewed signs/symptoms of infection  Medications Reviewed Today: Medications Reviewed Today     Reviewed by Aura Mliss LABOR, RN (Registered Nurse) on 10/01/24 at 1039  Med List Status: <None>   Medication Order Taking? Sig Documenting Provider Last Dose Status Informant  acetaminophen  (TYLENOL ) 500 MG tablet [id number] Yes Take 500-1,000 mg by mouth 2 (two) times daily as needed for mild pain. [provider]  Active Self, Pharmacy Records  amLODipine  (NORVASC ) 10 MG tablet [id number] Yes TAKE 1 TABLET EVERY DAY Alphonsa Glendia LABOR, MD  Active Self, Pharmacy Records  aspirin  81 MG tablet [id number] Yes Take 81 mg by mouth daily. [provider]  Active Self, Pharmacy Records  atorvastatin  (LIPITOR ) 80 MG tablet [id number] Yes TAKE 1 TABLET EVERY DAY Luking,  Glendia LABOR, MD  Active Self, Pharmacy Records  brimonidine  (ALPHAGAN ) 0.2 % ophthalmic solution [id number] Yes Place 1 drop into both eyes in the morning and at bedtime. [provider]  Active Self, Pharmacy Records  clopidogrel  (PLAVIX ) 75 MG tablet [id number] Yes TAKE 1 TABLET (75 MG TOTAL) BY MOUTH DAILY. Alphonsa Glendia LABOR, MD  Active Self, Pharmacy Records  dorzolamide -timolol  (COSOPT ) 2-0.5 % ophthalmic solution [id number] Yes Place 1 drop into both eyes 2 times daily. [provider]  Active Self, Pharmacy Records  doxazosin  (CARDURA ) 4 MG tablet [id number] Yes TAKE 1 TABLET AT BEDTIME Alphonsa Glendia LABOR, MD  Active Self, Pharmacy Records  empagliflozin  (JARDIANCE ) 10 MG TABS tablet [id number] Yes TAKE 1 TABLET EVERY DAY BEFORE BREAKFAST Mauro Elveria BROCKS, NP  Active Self, Pharmacy Records  ezetimibe  (ZETIA ) 10 MG tablet [id number] Yes Take 1 tablet (10 mg total) by mouth daily. Mauro Elveria BROCKS, NP  Active Self, Pharmacy Records  fexofenadine  (ALLEGRA ) 60 MG tablet [id number] Yes Take 1 tablet (60 mg total) by mouth 2 (two) times daily. Ameduite, Leonna S, FNP  Active Self, Pharmacy Records  fluticasone  (FLONASE ) 50 MCG/ACT nasal spray [id number] Yes USE 2 SPRAYS IN EACH NOSTRIL ONE TIME DAILY Alphonsa Glendia LABOR, MD  Active Self, Pharmacy Records  hydrochlorothiazide  (HYDRODIURIL ) 12.5 MG tablet [id number] Yes Take 12.5 mg by mouth daily. [provider]  Active Self, Pharmacy Records  latanoprost  (XALATAN ) 0.005 % ophthalmic solution [id number] Yes Place 1 drop into both eyes at bedtime. [provider]  Active Self, Pharmacy Records  metFORMIN  (GLUCOPHAGE ) 500 MG tablet [id number] Yes TAKE 2 TABLETS TWICE DAILY Luking, Glendia LABOR, MD  Active Self, Pharmacy Records  Multiple Vitamins-Minerals (MULTIVITAMIN WOMEN 50+) TABS [id number] Yes Take 1 tablet  by mouth daily. [provider]  Active Self, Pharmacy Records  nitroGLYCERIN  (NITROSTAT ) 0.4 MG SL tablet [id number] Yes  Place 1 tablet (0.4 mg total) under the tongue every 5 (five) minutes as needed for chest pain. Alphonsa Glendia LABOR, MD  Active            Med Note MARC LANETA HERO   Thu Sep 27, 2024  6:00 AM) Never used per patient  olmesartan  (BENICAR ) 20 MG tablet [id number] Yes Take 1 tablet (20 mg total) by mouth daily. Lelon Glendia T, PA-C  Active   [facility] ULTRA test strip [id number] Yes USE 1 STRIP TO CHECK GLUCOSE ONCE DAILY Alphonsa Glendia LABOR, MD  Active Self, Pharmacy Records  pantoprazole  (PROTONIX ) 40 MG tablet [id number] Yes TAKE 1 TABLET EVERY DAY AS NEEDED AS DIRECTED (DISCONTINUE FAMOTIDINE ) Alphonsa Glendia LABOR, MD  Active Self, Pharmacy Records  traMADol  (ULTRAM ) 50 MG tablet [id number] Yes Take 1 tablet (50 mg total) by mouth every 6 (six) hours as needed for severe pain (pain score 7-10). Charlyne Reed, PA-C  Active   VENTOLIN  HFA 108 (90 Base) MCG/ACT inhaler [id number] Yes INHALE 2 PUFFS INTO THE LUNGS EVERY 4 (FOUR) HOURS AS NEEDED. Alphonsa Glendia LABOR, MD  Active Self, Pharmacy Records            Home Care and Equipment/Supplies: Were Home Health Services Ordered?: No Any new equipment or medical supplies ordered?: No  Functional Questionnaire: Do you need assistance with bathing/showering or dressing?: No Do you need assistance with meal preparation?: No Do you need assistance with eating?: No Do you have difficulty maintaining continence: No Do you need assistance with getting out of bed/getting out of a chair/moving?: No Do you have difficulty managing or taking your medications?: No  Follow up appointments reviewed: PCP Follow-up appointment confirmed?: No (pt declines for RN CM to schedule post hospital follow up, states  I'll keep what I got) MD Provider Line [phone] Given: No Specialist Hospital Follow-up appointment confirmed?: No (pt states they are supposed to call me to schedule appointment, if they don't, I'll call them) Reason Specialist Follow-Up Not Confirmed:  Patient has Specialist Provider Number and will Call for Appointment Do you need transportation to your follow-up appointment?: No Do you understand care options if your condition(s) worsen?: Yes-patient verbalized understanding  SDOH Interventions Today    Flowsheet Row Most Recent Value  SDOH Interventions   Food Insecurity Interventions Intervention Not Indicated  Housing Interventions Intervention Not Indicated  Transportation Interventions Intervention Not Indicated  Utilities Interventions Intervention Not Indicated    Mliss Creed [facility], BSN RN Care Manager/ Transition of Care [facility] [phone]  "

## 2024-10-23 ENCOUNTER — Ambulatory Visit (INDEPENDENT_AMBULATORY_CARE_PROVIDER_SITE_OTHER): Admitting: General Surgery

## 2024-10-23 ENCOUNTER — Other Ambulatory Visit: Payer: Self-pay

## 2024-10-23 ENCOUNTER — Encounter: Payer: Self-pay | Admitting: General Surgery

## 2024-10-23 VITALS — BP 179/79 | HR 94 | Temp 98.7°F | Resp 18 | Ht 64.0 in | Wt 161.0 lb

## 2024-10-23 DIAGNOSIS — M6289 Other specified disorders of muscle: Secondary | ICD-10-CM | POA: Diagnosis not present

## 2024-10-23 NOTE — Progress Notes (Signed)
 Surgery By [facility]  Doing well. No further fecal incontinence. She has had follow up with cardiology recently and they too noted a systolic murmur. She had a carotid surgery on the right in December.  She says she is doing prunes/ raisins and is not having issues. She is doing kegels on her own and never went to physical therapy.  BP (!) 179/79 (BP Location: Right Arm, Patient Position: Sitting, Cuff Size: Normal) Comment: BP elevated. Advised to follow up with PCP.  Pulse 94   Temp 98.7 F (37.1 C) (Oral)   Resp 18   Ht 5' 4 (1.626 m)   Wt 161 lb (73 kg)   SpO2 96%   BMI 27.64 kg/m  Looking well, sitting in chair Rectal deferred as no complaints  Patient with pelvic floor dysfunction.   Continue your raisins and prunes as these are good sources of fiber. Continue your kegel exercise. If you start to have more leakage or issues, then let us  know, we can get you referred to the physical therapist to help with pelvic floor exercises.   PRN Follow up.  Manuelita Pander, MD [facility] [address] [place], [place] [address] [phone] (office)

## 2024-10-23 NOTE — Patient Instructions (Addendum)
 Continue your raisins and prunes as these are good sources of fiber. Continue your kegel exercise. If you start to have more leakage or issues, then let us  know, we can get you referred to the physical therapist to help with pelvic floor exercises.

## 2024-11-02 ENCOUNTER — Ambulatory Visit (HOSPITAL_COMMUNITY): Admission: RE | Admit: 2024-11-02 | Discharge: 2024-11-02 | Disposition: A | Attending: Vascular Surgery

## 2024-11-02 ENCOUNTER — Encounter: Payer: Self-pay | Admitting: Physician Assistant

## 2024-11-02 ENCOUNTER — Ambulatory Visit (INDEPENDENT_AMBULATORY_CARE_PROVIDER_SITE_OTHER): Admitting: Physician Assistant

## 2024-11-02 VITALS — BP 174/79 | HR 67 | Temp 98.3°F | Ht 64.0 in | Wt 161.0 lb

## 2024-11-02 DIAGNOSIS — I6529 Occlusion and stenosis of unspecified carotid artery: Secondary | ICD-10-CM | POA: Insufficient documentation

## 2024-11-02 DIAGNOSIS — I6523 Occlusion and stenosis of bilateral carotid arteries: Secondary | ICD-10-CM | POA: Insufficient documentation

## 2024-11-02 NOTE — Progress Notes (Signed)
 " POST OPERATIVE OFFICE NOTE    CC:  F/u for surgery  HPI: Kara Kirby is a 79 y.o. female who is here for postop visit.  She recently underwent right TCAR on 09/27/2024 by Dr. Pearline.  This was done for high-grade in-stent stenosis.  She has a remote history of right carotid endarterectomy in 2004 and right transfemoral ICA stenting in 2017.  She returns today for follow-up.  She has no complaints at today's office visit.  She feels like her right sided neck incision is healing well.  She denies any strokelike symptoms such as slurred speech, sudden visual changes, facial droop, or sudden weakness/numbness.  She takes a daily aspirin , Plavix , and statin.   Allergies[1]  Current Outpatient Medications  Medication Sig Dispense Refill   acetaminophen  (TYLENOL ) 500 MG tablet Take 500-1,000 mg by mouth 2 (two) times daily as needed for mild pain.     amLODipine  (NORVASC ) 10 MG tablet TAKE 1 TABLET EVERY DAY 90 tablet 3   aspirin  81 MG tablet Take 81 mg by mouth daily.     atorvastatin  (LIPITOR ) 80 MG tablet TAKE 1 TABLET EVERY DAY 90 tablet 3   brimonidine  (ALPHAGAN ) 0.2 % ophthalmic solution Place 1 drop into both eyes in the morning and at bedtime.     clopidogrel  (PLAVIX ) 75 MG tablet TAKE 1 TABLET (75 MG TOTAL) BY MOUTH DAILY. 90 tablet 3   dorzolamide -timolol  (COSOPT ) 2-0.5 % ophthalmic solution Place 1 drop into both eyes 2 times daily.     doxazosin  (CARDURA ) 4 MG tablet TAKE 1 TABLET AT BEDTIME 90 tablet 3   empagliflozin  (JARDIANCE ) 10 MG TABS tablet TAKE 1 TABLET EVERY DAY BEFORE BREAKFAST 90 tablet 3   ezetimibe  (ZETIA ) 10 MG tablet Take 1 tablet (10 mg total) by mouth daily. 90 tablet 3   fexofenadine  (ALLEGRA ) 60 MG tablet Take 1 tablet (60 mg total) by mouth 2 (two) times daily. 30 tablet 1   fluticasone  (FLONASE ) 50 MCG/ACT nasal spray USE 2 SPRAYS IN EACH NOSTRIL ONE TIME DAILY 48 g 3   hydrochlorothiazide  (HYDRODIURIL ) 12.5 MG tablet Take 12.5 mg by mouth daily.      latanoprost  (XALATAN ) 0.005 % ophthalmic solution Place 1 drop into both eyes at bedtime.     metFORMIN  (GLUCOPHAGE ) 500 MG tablet TAKE 2 TABLETS TWICE DAILY 360 tablet 3   Multiple Vitamins-Minerals (MULTIVITAMIN WOMEN 50+) TABS Take 1 tablet by mouth daily.     nitroGLYCERIN  (NITROSTAT ) 0.4 MG SL tablet Place 1 tablet (0.4 mg total) under the tongue every 5 (five) minutes as needed for chest pain. 50 tablet 3   olmesartan  (BENICAR ) 20 MG tablet Take 1 tablet (20 mg total) by mouth daily. 90 tablet 3   ONETOUCH ULTRA test strip USE 1 STRIP TO CHECK GLUCOSE ONCE DAILY 100 each 0   pantoprazole  (PROTONIX ) 40 MG tablet TAKE 1 TABLET EVERY DAY AS NEEDED AS DIRECTED (DISCONTINUE FAMOTIDINE ) 90 tablet 3   traMADol  (ULTRAM ) 50 MG tablet Take 1 tablet (50 mg total) by mouth every 6 (six) hours as needed for severe pain (pain score 7-10). 12 tablet 0   VENTOLIN  HFA 108 (90 Base) MCG/ACT inhaler INHALE 2 PUFFS INTO THE LUNGS EVERY 4 (FOUR) HOURS AS NEEDED. 36 g 3   No current facility-administered medications for this visit.    ROS:  See HPI  Physical Exam:  Incision: Right sided neck incision well-healed without signs of infection or hematoma Extremities: Palpable radial pulses bilaterally Neuro: No neurological deficits on  exam, alert and oriented x 3  Studies: Carotid duplex (11/02/2024) Patent right carotid artery stent without stenosis.  Stable left ICA stenosis 40 to 59%.   Assessment/Plan:  This is a 79 y.o. female who is here for a post op visit  - The patient recently underwent right TCAR for high-grade stenosis within a previously existing right carotid artery stent.  Her right sided neck incision is well-healed without signs of infection -Carotid duplex demonstrates a patent right carotid artery stent without stenosis.  She has stable left ICA stenosis of 40 to 59% -She denies any strokelike symptoms since surgery such as slurred speech, facial droop, sudden weakness/numbness, or  sudden visual changes - She has palpable radial pulses bilaterally.  She is neurologically intact. - She will continue her daily aspirin , Plavix , statin and follow-up with our office in 9 months with repeat carotid duplex   Ahmed Holster, PA-C [facility] [phone]   Clinic MD:  Pearline     [1]  Allergies Allergen Reactions   Other Hives    IV DYE    Iodinated Contrast Media Hives    Pre-medicate with benadryl  and prednisone    Zestril  [Lisinopril ] Swelling    Angioedema of tongue July 2017   "

## 2024-11-09 ENCOUNTER — Other Ambulatory Visit (HOSPITAL_COMMUNITY)
Admission: RE | Admit: 2024-11-09 | Discharge: 2024-11-09 | Disposition: A | Source: Ambulatory Visit | Attending: Nephrology | Admitting: Nephrology

## 2024-11-09 LAB — RENAL FUNCTION PANEL
Albumin: 4.7 g/dL (ref 3.5–5.0)
Anion gap: 15 (ref 5–15)
BUN: 22 mg/dL (ref 8–23)
CO2: 25 mmol/L (ref 22–32)
Calcium: 9.8 mg/dL (ref 8.9–10.3)
Chloride: 101 mmol/L (ref 98–111)
Creatinine, Ser: 1.08 mg/dL — ABNORMAL HIGH (ref 0.44–1.00)
GFR, Estimated: 52 mL/min — ABNORMAL LOW
Glucose, Bld: 183 mg/dL — ABNORMAL HIGH (ref 70–99)
Phosphorus: 3.4 mg/dL (ref 2.5–4.6)
Potassium: 3.7 mmol/L (ref 3.5–5.1)
Sodium: 142 mmol/L (ref 135–145)

## 2024-11-09 LAB — CBC
HCT: 45.1 % (ref 36.0–46.0)
Hemoglobin: 14.3 g/dL (ref 12.0–15.0)
MCH: 30.6 pg (ref 26.0–34.0)
MCHC: 31.7 g/dL (ref 30.0–36.0)
MCV: 96.4 fL (ref 80.0–100.0)
Platelets: 260 10*3/uL (ref 150–400)
RBC: 4.68 MIL/uL (ref 3.87–5.11)
RDW: 14 % (ref 11.5–15.5)
WBC: 5.5 10*3/uL (ref 4.0–10.5)
nRBC: 0 % (ref 0.0–0.2)

## 2024-11-09 LAB — VITAMIN D 25 HYDROXY (VIT D DEFICIENCY, FRACTURES): Vit D, 25-Hydroxy: 55.3 ng/mL (ref 30–100)

## 2024-11-09 LAB — PROTEIN / CREATININE RATIO, URINE
Creatinine, Urine: 23 mg/dL
Protein Creatinine Ratio: 3.4 mg/mg — ABNORMAL HIGH
Total Protein, Urine: 77 mg/dL

## 2024-11-10 LAB — PARATHYROID HORMONE, INTACT (NO CA): PTH: 19 pg/mL (ref 15–65)

## 2024-11-22 ENCOUNTER — Ambulatory Visit

## 2024-12-12 ENCOUNTER — Ambulatory Visit: Admitting: Family Medicine

## 2024-12-19 ENCOUNTER — Encounter: Admitting: Nutrition

## 2025-08-02 ENCOUNTER — Ambulatory Visit (HOSPITAL_COMMUNITY)

## 2025-08-02 ENCOUNTER — Ambulatory Visit
